# Patient Record
Sex: Male | Born: 1960 | Race: White | Hispanic: No | Smoking: Current every day smoker
Health system: Southern US, Community
[De-identification: ages and names within clinical notes are randomized; demographics above are authoritative.]

## PROBLEM LIST (undated history)

## (undated) DIAGNOSIS — F329 Major depressive disorder, single episode, unspecified: Secondary | ICD-10-CM

## (undated) DIAGNOSIS — F1011 Alcohol abuse, in remission: Secondary | ICD-10-CM

## (undated) DIAGNOSIS — F32A Depression, unspecified: Secondary | ICD-10-CM

## (undated) DIAGNOSIS — I219 Acute myocardial infarction, unspecified: Secondary | ICD-10-CM

## (undated) DIAGNOSIS — I10 Essential (primary) hypertension: Secondary | ICD-10-CM

## (undated) DIAGNOSIS — K449 Diaphragmatic hernia without obstruction or gangrene: Secondary | ICD-10-CM

## (undated) DIAGNOSIS — D126 Benign neoplasm of colon, unspecified: Secondary | ICD-10-CM

## (undated) DIAGNOSIS — J439 Emphysema, unspecified: Secondary | ICD-10-CM

## (undated) DIAGNOSIS — K219 Gastro-esophageal reflux disease without esophagitis: Secondary | ICD-10-CM

## (undated) DIAGNOSIS — K2270 Barrett's esophagus without dysplasia: Secondary | ICD-10-CM

## (undated) DIAGNOSIS — I43 Cardiomyopathy in diseases classified elsewhere: Secondary | ICD-10-CM

## (undated) DIAGNOSIS — K5790 Diverticulosis of intestine, part unspecified, without perforation or abscess without bleeding: Secondary | ICD-10-CM

## (undated) DIAGNOSIS — F419 Anxiety disorder, unspecified: Secondary | ICD-10-CM

## (undated) DIAGNOSIS — E7800 Pure hypercholesterolemia, unspecified: Secondary | ICD-10-CM

## (undated) DIAGNOSIS — Z955 Presence of coronary angioplasty implant and graft: Secondary | ICD-10-CM

## (undated) DIAGNOSIS — F1010 Alcohol abuse, uncomplicated: Secondary | ICD-10-CM

## (undated) DIAGNOSIS — Z973 Presence of spectacles and contact lenses: Secondary | ICD-10-CM

## (undated) DIAGNOSIS — K222 Esophageal obstruction: Secondary | ICD-10-CM

## (undated) DIAGNOSIS — F17200 Nicotine dependence, unspecified, uncomplicated: Secondary | ICD-10-CM

## (undated) DIAGNOSIS — I2510 Atherosclerotic heart disease of native coronary artery without angina pectoris: Secondary | ICD-10-CM

## (undated) HISTORY — DX: Barrett's esophagus without dysplasia: K22.70

## (undated) HISTORY — DX: Pure hypercholesterolemia, unspecified: E78.00

## (undated) HISTORY — DX: Emphysema, unspecified: J43.9

## (undated) HISTORY — DX: Alcohol abuse, uncomplicated: F10.10

## (undated) HISTORY — DX: Major depressive disorder, single episode, unspecified: F32.9

## (undated) HISTORY — DX: Cardiomyopathy in diseases classified elsewhere: I43

## (undated) HISTORY — DX: Diaphragmatic hernia without obstruction or gangrene: K44.9

## (undated) HISTORY — PX: HERNIA REPAIR: SHX51

## (undated) HISTORY — DX: Anxiety disorder, unspecified: F41.9

## (undated) HISTORY — PX: MOUTH SURGERY: SHX715

## (undated) HISTORY — DX: Esophageal obstruction: K22.2

## (undated) HISTORY — PX: WISDOM TOOTH EXTRACTION: SHX21

## (undated) HISTORY — DX: Benign neoplasm of colon, unspecified: D12.6

## (undated) HISTORY — DX: Depression, unspecified: F32.A

## (undated) HISTORY — PX: CORONARY STENT PLACEMENT: SHX1402

## (undated) HISTORY — PX: TONSILLECTOMY: SUR1361

## (undated) HISTORY — DX: Diverticulosis of intestine, part unspecified, without perforation or abscess without bleeding: K57.90

---

## 2001-02-13 ENCOUNTER — Encounter (INDEPENDENT_AMBULATORY_CARE_PROVIDER_SITE_OTHER): Payer: Self-pay | Admitting: Specialist

## 2001-02-13 ENCOUNTER — Ambulatory Visit (HOSPITAL_COMMUNITY): Admission: RE | Admit: 2001-02-13 | Discharge: 2001-02-13 | Payer: Self-pay | Admitting: Gastroenterology

## 2001-02-13 ENCOUNTER — Encounter: Payer: Self-pay | Admitting: Gastroenterology

## 2005-10-13 ENCOUNTER — Ambulatory Visit: Payer: Self-pay | Admitting: *Deleted

## 2005-10-25 ENCOUNTER — Ambulatory Visit: Payer: Self-pay | Admitting: *Deleted

## 2005-11-13 ENCOUNTER — Ambulatory Visit: Payer: Self-pay | Admitting: *Deleted

## 2005-11-24 ENCOUNTER — Ambulatory Visit: Payer: Self-pay | Admitting: *Deleted

## 2005-12-08 ENCOUNTER — Ambulatory Visit: Payer: Self-pay | Admitting: *Deleted

## 2005-12-15 ENCOUNTER — Ambulatory Visit: Payer: Self-pay | Admitting: *Deleted

## 2005-12-29 ENCOUNTER — Ambulatory Visit: Payer: Self-pay | Admitting: *Deleted

## 2006-01-19 ENCOUNTER — Ambulatory Visit: Payer: Self-pay | Admitting: *Deleted

## 2006-01-26 ENCOUNTER — Ambulatory Visit: Payer: Self-pay | Admitting: *Deleted

## 2006-02-02 ENCOUNTER — Ambulatory Visit: Payer: Self-pay | Admitting: *Deleted

## 2006-02-16 ENCOUNTER — Ambulatory Visit: Payer: Self-pay | Admitting: *Deleted

## 2006-02-23 ENCOUNTER — Ambulatory Visit: Payer: Self-pay | Admitting: *Deleted

## 2006-03-09 ENCOUNTER — Ambulatory Visit: Payer: Self-pay | Admitting: *Deleted

## 2006-03-16 ENCOUNTER — Ambulatory Visit: Payer: Self-pay | Admitting: *Deleted

## 2006-04-13 ENCOUNTER — Ambulatory Visit: Payer: Self-pay | Admitting: *Deleted

## 2006-04-27 ENCOUNTER — Ambulatory Visit: Payer: Self-pay | Admitting: *Deleted

## 2006-05-02 ENCOUNTER — Ambulatory Visit: Payer: Self-pay | Admitting: *Deleted

## 2006-05-09 ENCOUNTER — Ambulatory Visit: Payer: Self-pay | Admitting: *Deleted

## 2006-05-16 ENCOUNTER — Ambulatory Visit: Payer: Self-pay | Admitting: *Deleted

## 2006-05-23 ENCOUNTER — Ambulatory Visit: Payer: Self-pay | Admitting: *Deleted

## 2006-05-30 ENCOUNTER — Ambulatory Visit: Payer: Self-pay | Admitting: *Deleted

## 2006-06-06 ENCOUNTER — Ambulatory Visit: Payer: Self-pay | Admitting: *Deleted

## 2006-06-13 ENCOUNTER — Ambulatory Visit: Payer: Self-pay | Admitting: *Deleted

## 2006-06-20 ENCOUNTER — Ambulatory Visit: Payer: Self-pay | Admitting: *Deleted

## 2006-06-27 ENCOUNTER — Ambulatory Visit: Payer: Self-pay | Admitting: *Deleted

## 2006-07-04 ENCOUNTER — Ambulatory Visit: Payer: Self-pay | Admitting: *Deleted

## 2006-07-18 ENCOUNTER — Ambulatory Visit: Payer: Self-pay | Admitting: *Deleted

## 2006-07-25 ENCOUNTER — Ambulatory Visit: Payer: Self-pay | Admitting: *Deleted

## 2006-08-01 ENCOUNTER — Ambulatory Visit: Payer: Self-pay | Admitting: *Deleted

## 2006-08-08 ENCOUNTER — Ambulatory Visit: Payer: Self-pay | Admitting: *Deleted

## 2006-08-15 ENCOUNTER — Ambulatory Visit: Payer: Self-pay | Admitting: *Deleted

## 2006-08-22 ENCOUNTER — Ambulatory Visit: Payer: Self-pay | Admitting: *Deleted

## 2006-08-29 ENCOUNTER — Ambulatory Visit: Payer: Self-pay | Admitting: *Deleted

## 2006-09-05 ENCOUNTER — Ambulatory Visit: Payer: Self-pay | Admitting: *Deleted

## 2006-09-12 ENCOUNTER — Ambulatory Visit: Payer: Self-pay | Admitting: *Deleted

## 2006-10-03 ENCOUNTER — Ambulatory Visit: Payer: Self-pay | Admitting: *Deleted

## 2006-10-10 ENCOUNTER — Ambulatory Visit: Payer: Self-pay | Admitting: *Deleted

## 2006-10-17 ENCOUNTER — Ambulatory Visit: Payer: Self-pay | Admitting: *Deleted

## 2006-10-24 ENCOUNTER — Ambulatory Visit: Payer: Self-pay | Admitting: *Deleted

## 2006-10-31 ENCOUNTER — Ambulatory Visit: Payer: Self-pay | Admitting: *Deleted

## 2006-11-14 ENCOUNTER — Ambulatory Visit: Payer: Self-pay | Admitting: *Deleted

## 2006-11-28 ENCOUNTER — Ambulatory Visit: Payer: Self-pay | Admitting: *Deleted

## 2006-12-05 ENCOUNTER — Ambulatory Visit: Payer: Self-pay | Admitting: *Deleted

## 2006-12-14 ENCOUNTER — Ambulatory Visit: Payer: Self-pay | Admitting: *Deleted

## 2006-12-19 ENCOUNTER — Ambulatory Visit: Payer: Self-pay | Admitting: *Deleted

## 2006-12-28 ENCOUNTER — Ambulatory Visit: Payer: Self-pay | Admitting: *Deleted

## 2007-10-03 DIAGNOSIS — I219 Acute myocardial infarction, unspecified: Secondary | ICD-10-CM

## 2007-10-03 HISTORY — PX: CARDIAC CATHETERIZATION: SHX172

## 2007-10-03 HISTORY — DX: Acute myocardial infarction, unspecified: I21.9

## 2008-01-23 ENCOUNTER — Inpatient Hospital Stay (HOSPITAL_COMMUNITY): Admission: EM | Admit: 2008-01-23 | Discharge: 2008-01-26 | Payer: Self-pay | Admitting: Emergency Medicine

## 2008-01-24 ENCOUNTER — Encounter (INDEPENDENT_AMBULATORY_CARE_PROVIDER_SITE_OTHER): Payer: Self-pay | Admitting: Cardiology

## 2009-10-02 HISTORY — PX: PARTIAL COLECTOMY: SHX5273

## 2009-10-02 HISTORY — PX: APPENDECTOMY: SHX54

## 2009-10-03 ENCOUNTER — Inpatient Hospital Stay (HOSPITAL_COMMUNITY): Admission: EM | Admit: 2009-10-03 | Discharge: 2009-10-12 | Payer: Self-pay | Admitting: Emergency Medicine

## 2009-10-06 ENCOUNTER — Encounter (INDEPENDENT_AMBULATORY_CARE_PROVIDER_SITE_OTHER): Payer: Self-pay | Admitting: Interventional Cardiology

## 2009-10-18 ENCOUNTER — Ambulatory Visit (HOSPITAL_COMMUNITY): Admission: RE | Admit: 2009-10-18 | Discharge: 2009-10-18 | Payer: Self-pay | Admitting: General Surgery

## 2009-10-28 ENCOUNTER — Inpatient Hospital Stay (HOSPITAL_COMMUNITY): Admission: AD | Admit: 2009-10-28 | Discharge: 2009-11-03 | Payer: Self-pay | Admitting: General Surgery

## 2009-10-28 HISTORY — PX: SIGMOIDECTOMY: SHX176

## 2010-06-22 IMAGING — CR DG ABD PORTABLE 1V
1 series · 1 of 1 positions shown · non-contrast
Comparison: Portable exam 6553 hours without priors for comparison.

CLINICAL DATA: Sigmoid diverticulitis, pelvic abscess, nasogastric
tube placement

ABDOMEN - 1 VIEW

[AP]
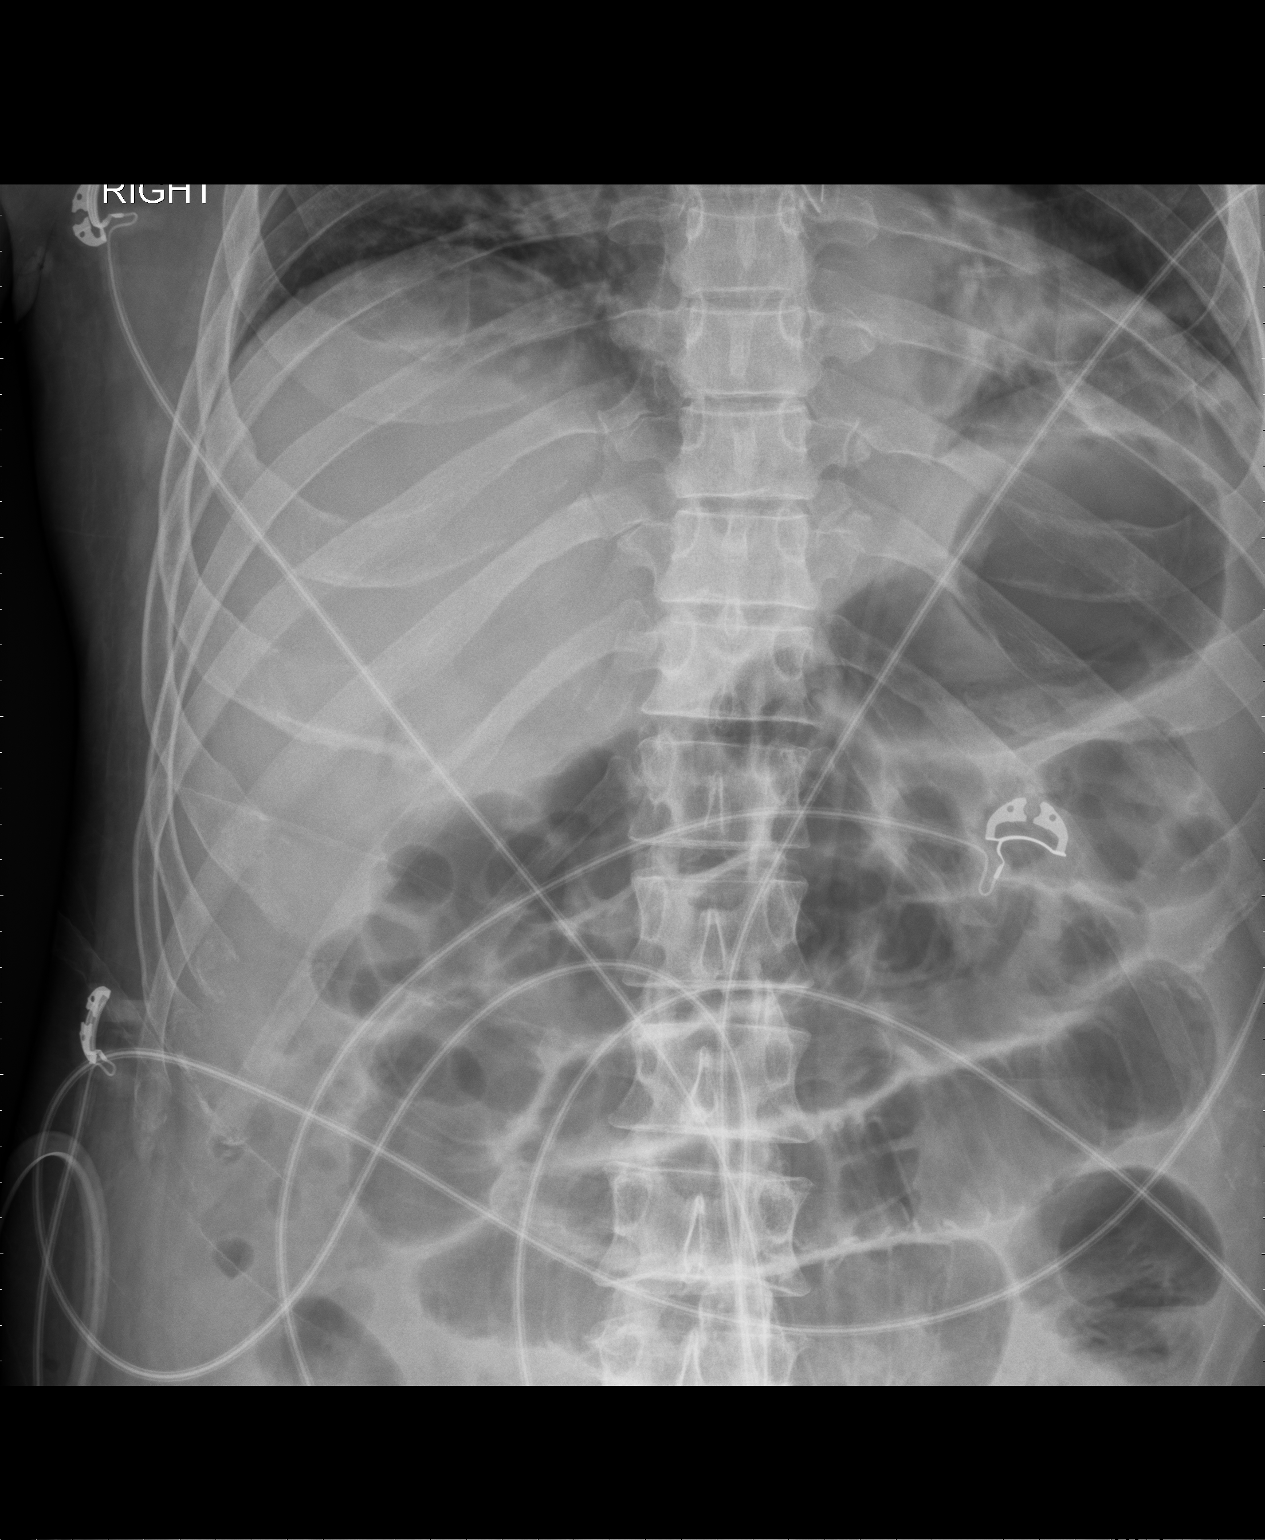

[1 of 1 positions shown; findings below may reference images not displayed]

FINDINGS: Nasogastric tube coiled in mid to distal thoracic esophagus.
Dilated small bowel loops throughout abdomen.
No definite bowel wall thickening or colonic dilatation.
Question bibasilar atelectasis.
IMPRESSION: Dilated small bowel loops, question ileus versus obstruction.
Nasogastric tube coiled in mid to distal thoracic esophagus,
recommend withdrawal and replacement.

## 2010-06-23 IMAGING — CR DG ABD PORTABLE 1V
1 series · 1 of 1 positions shown · non-contrast
Comparison: Plain films earlier today at 8538 hours

CLINICAL DATA: Nasogastric tube placement

ABDOMEN - 1 VIEW

[AP]
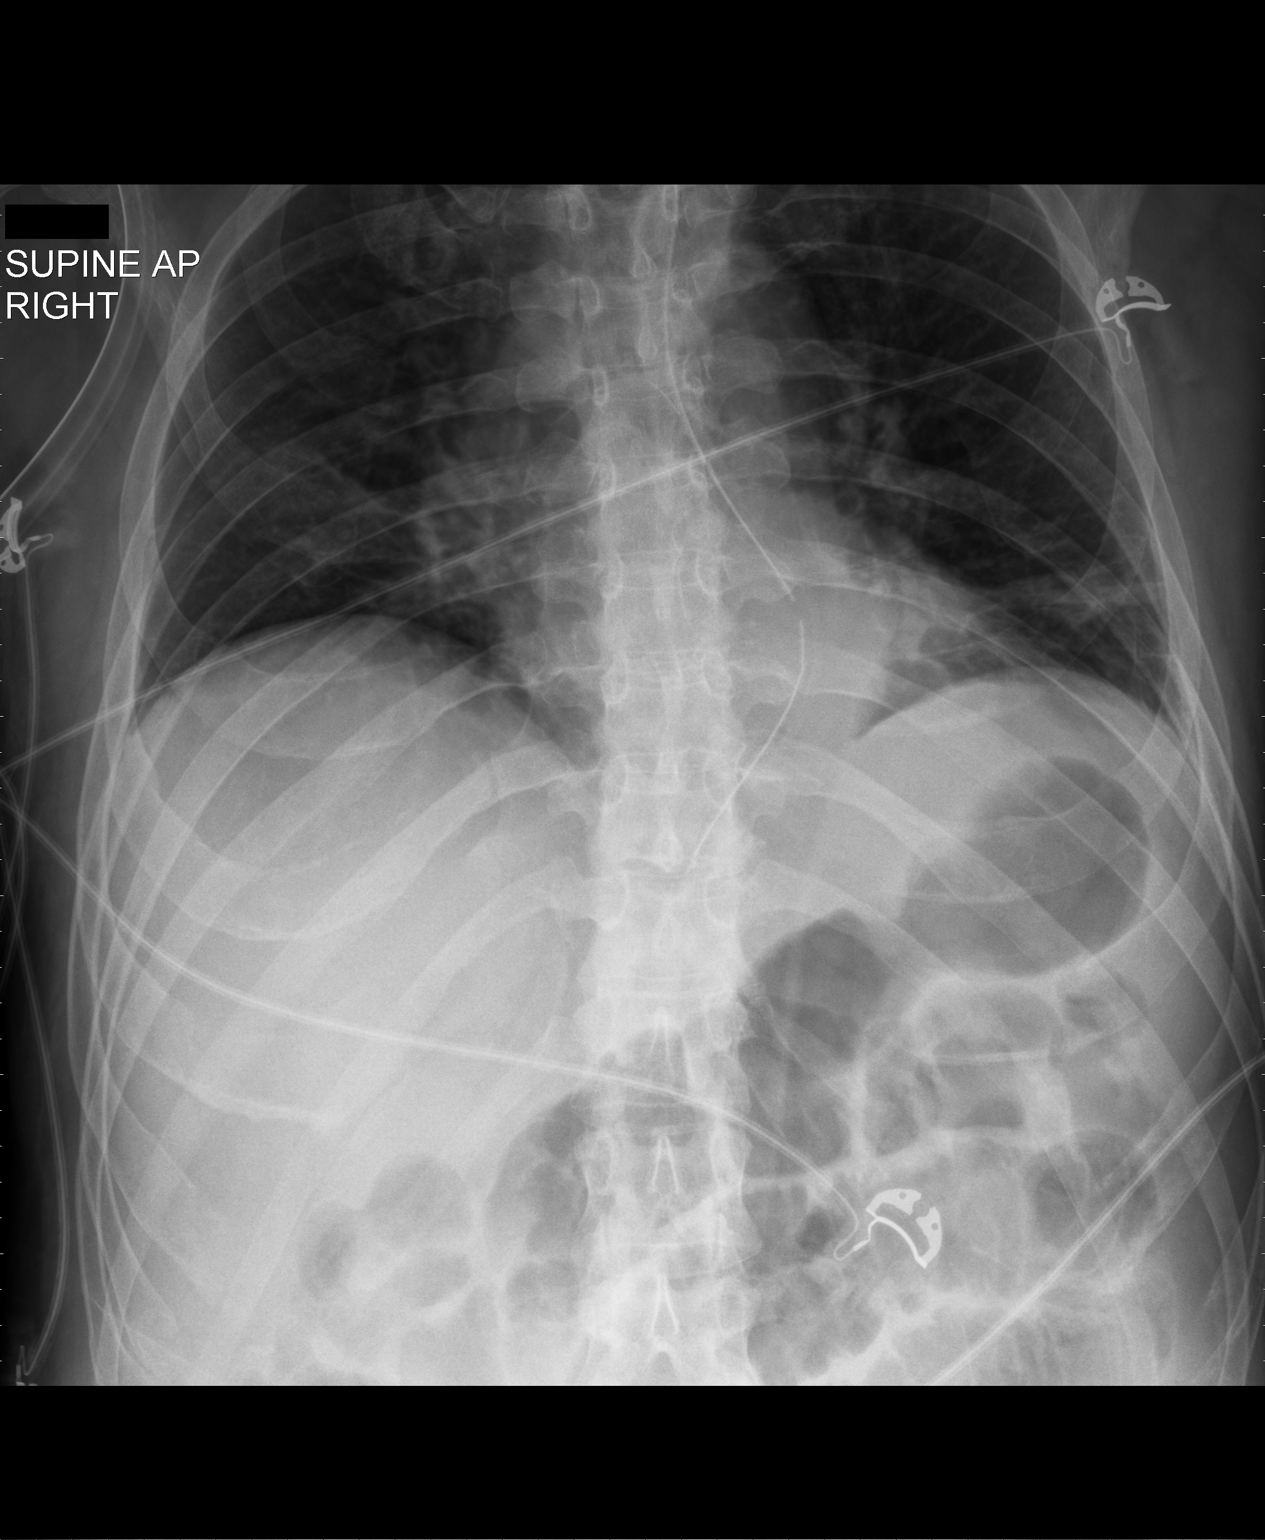

[1 of 1 positions shown; findings below may reference images not displayed]

FINDINGS: Nasogastric tube tip lies at approximately the
gastroesophageal junction, unchanged in position from films earlier
today. Moderate intestinal distention persists.
IMPRESSION: As above.

## 2010-06-23 IMAGING — CR DG ABD PORTABLE 1V
2 series · 2 of 2 positions shown · non-contrast
Comparison: Portable exam [DATE] hours compared to 10/29/2009

CLINICAL DATA: Sigmoid diverticulitis, pelvic abscess, nasogastric
tube placement

ABDOMEN - 1 VIEW

[view not recorded (1 of 2)]
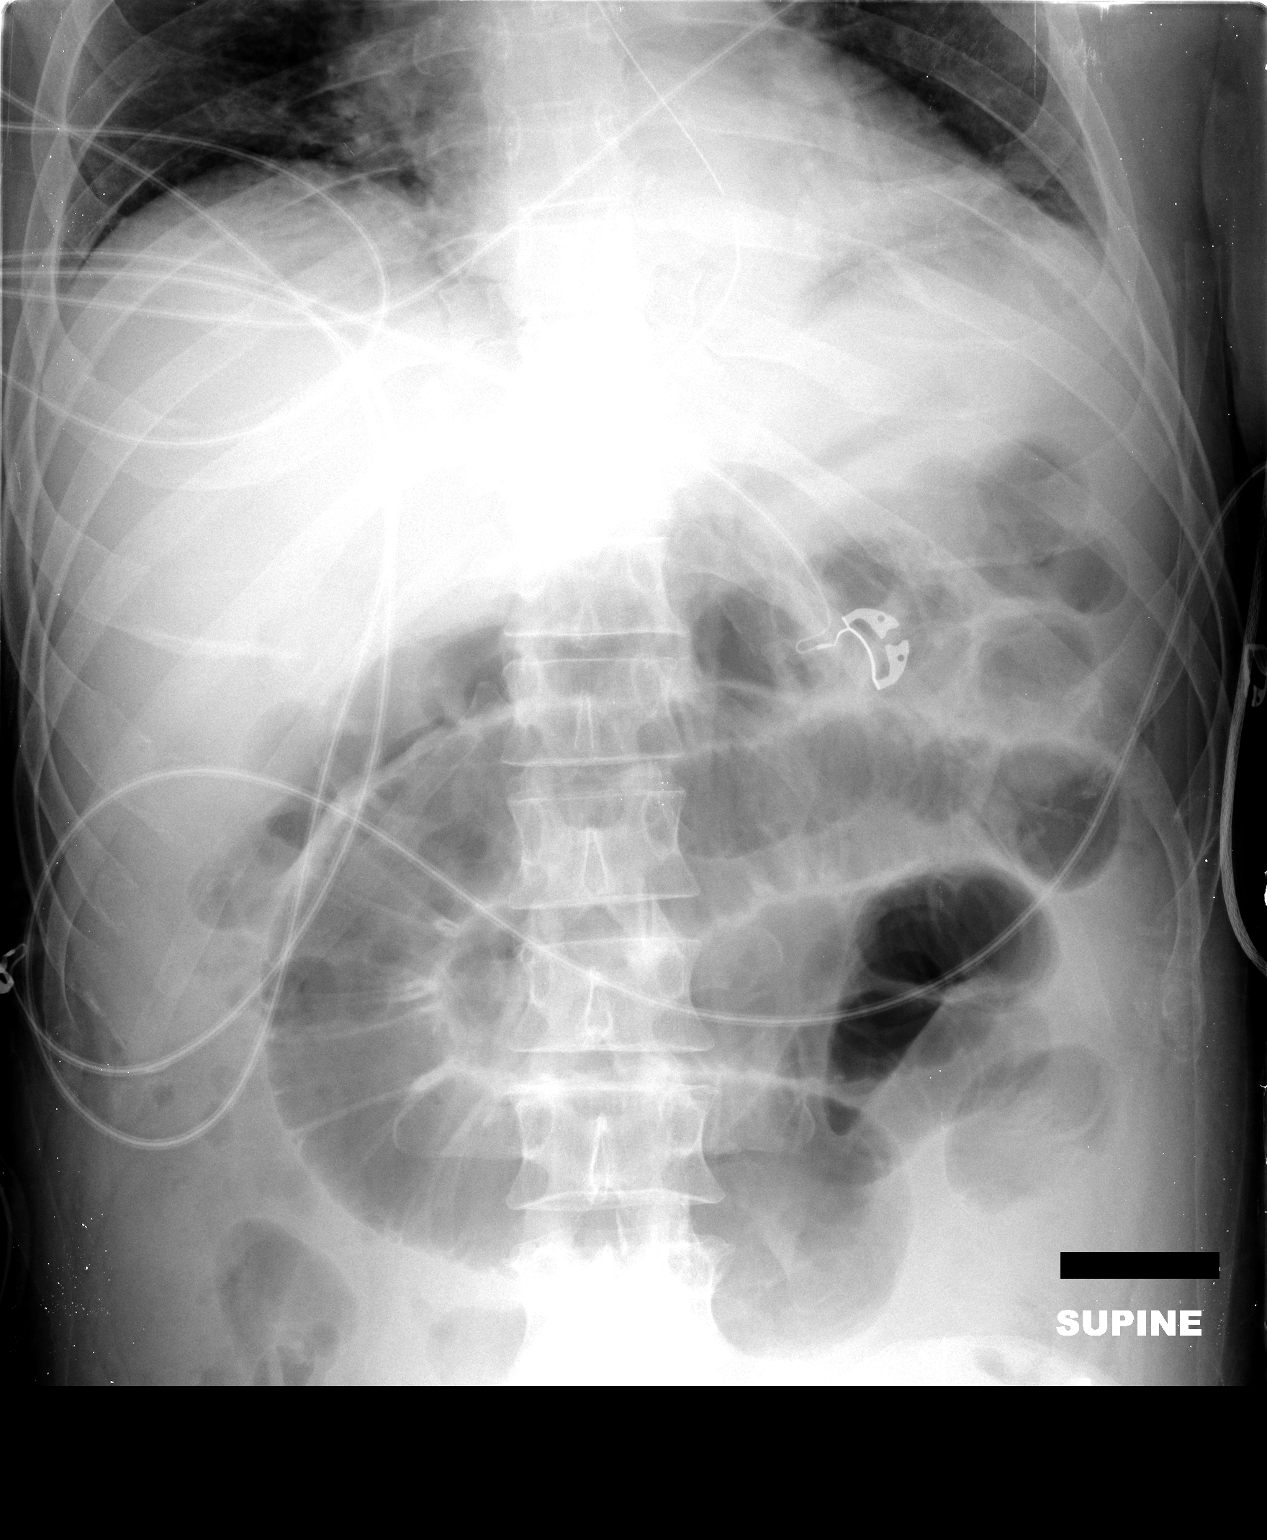

[view not recorded (2 of 2)]
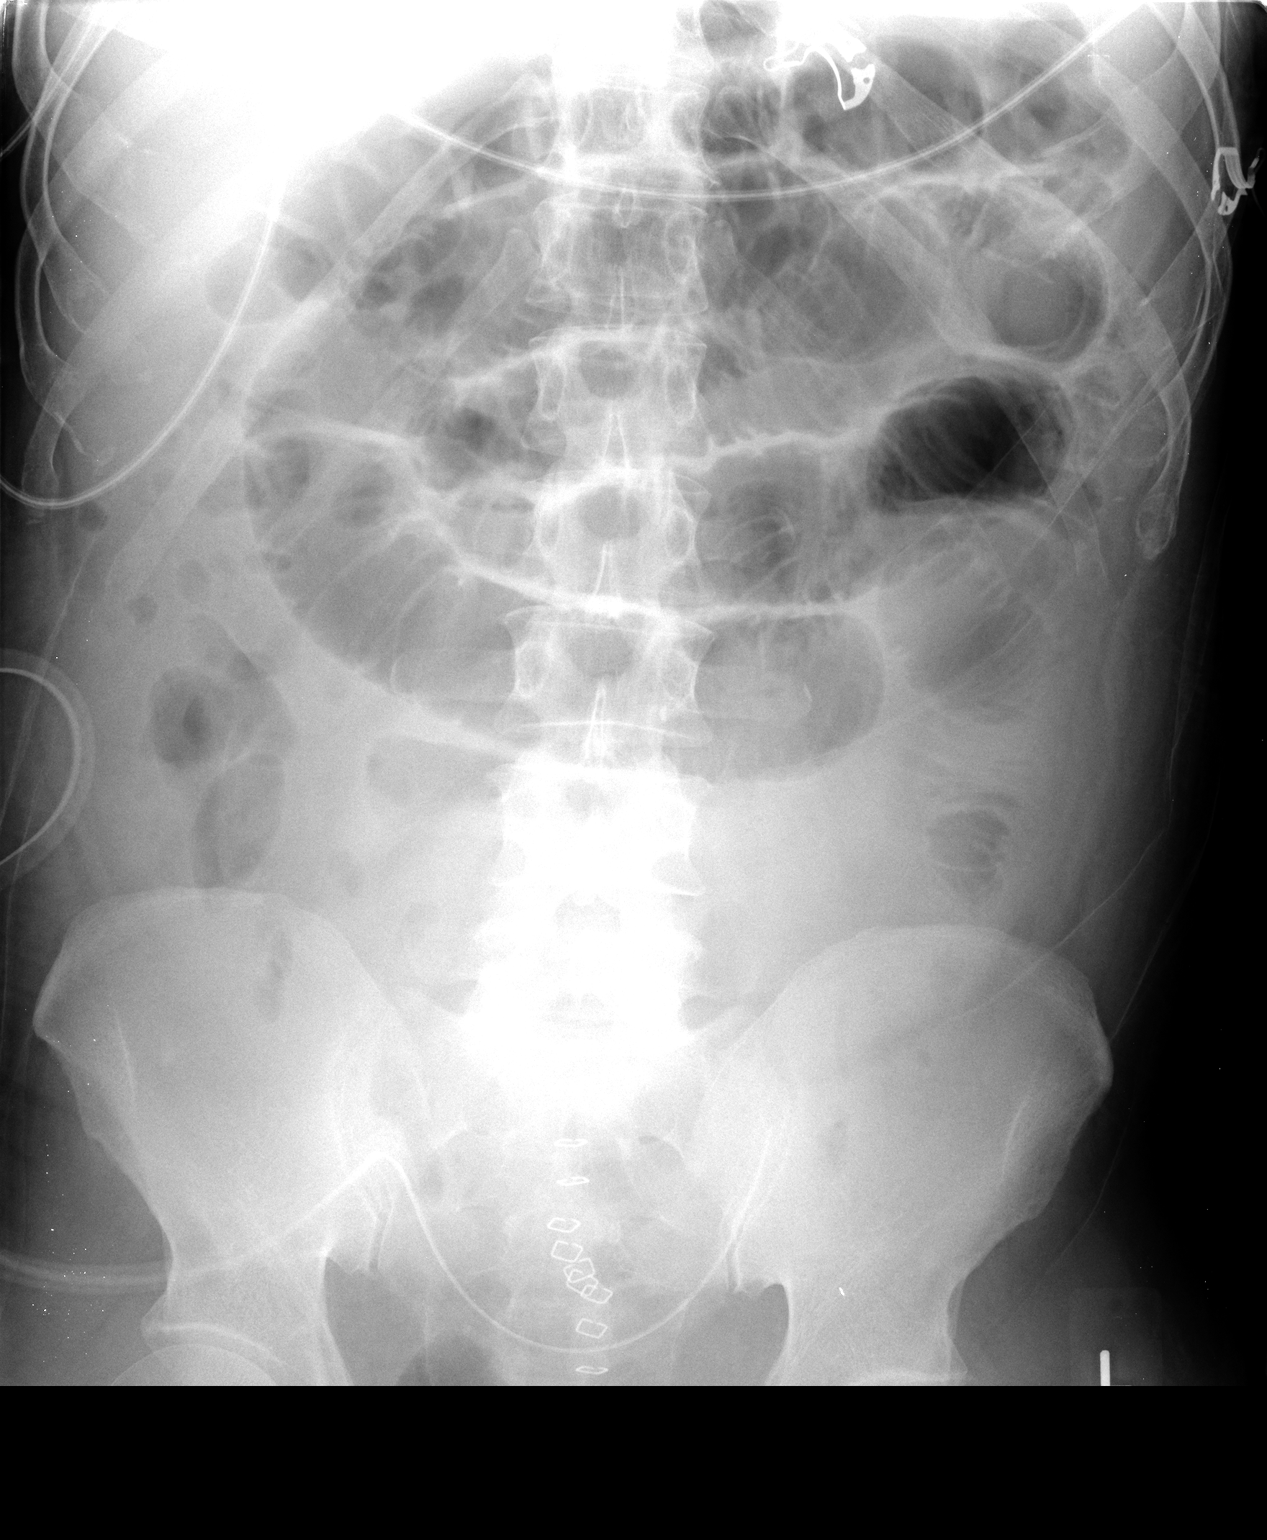

[2 of 2 positions shown; findings below may reference images not displayed]

FINDINGS: Tip of nasogastric tube is at the diaphragm or very upper abdomen,
potentially within a small hiatal hernia.
Persistent dilatation of small bowel loops.
Minimal bibasilar atelectasis.
Surgical drain in pelvis.
IMPRESSION: Persistent small bowel dilatation.
Tip of nasogastric tube is either within a small hiatal hernia or
at the gastroesophageal junction.

## 2010-10-02 DIAGNOSIS — D126 Benign neoplasm of colon, unspecified: Secondary | ICD-10-CM

## 2010-10-02 HISTORY — DX: Benign neoplasm of colon, unspecified: D12.6

## 2010-11-24 ENCOUNTER — Other Ambulatory Visit: Payer: Self-pay | Admitting: Gastroenterology

## 2010-12-18 LAB — POTASSIUM
Potassium: 2.9 mEq/L — ABNORMAL LOW (ref 3.5–5.1)
Potassium: 3.1 mEq/L — ABNORMAL LOW (ref 3.5–5.1)

## 2010-12-18 LAB — CBC
HCT: 30 % — ABNORMAL LOW (ref 39.0–52.0)
HCT: 40.6 % (ref 39.0–52.0)
HCT: 34.5 % — ABNORMAL LOW (ref 39.0–52.0)
HCT: 38.1 % — ABNORMAL LOW (ref 39.0–52.0)
Hemoglobin: 10.9 g/dL — ABNORMAL LOW (ref 13.0–17.0)
Hemoglobin: 11.4 g/dL — ABNORMAL LOW (ref 13.0–17.0)
Hemoglobin: 11.7 g/dL — ABNORMAL LOW (ref 13.0–17.0)
Hemoglobin: 11.7 g/dL — ABNORMAL LOW (ref 13.0–17.0)
Hemoglobin: 13.9 g/dL (ref 13.0–17.0)
Hemoglobin: 13.1 g/dL (ref 13.0–17.0)
Hemoglobin: 13.5 g/dL (ref 13.0–17.0)
MCHC: 34.1 g/dL (ref 30.0–36.0)
MCHC: 34.3 g/dL (ref 30.0–36.0)
MCHC: 34.4 g/dL (ref 30.0–36.0)
MCHC: 34.9 g/dL (ref 30.0–36.0)
MCHC: 32.9 g/dL (ref 30.0–36.0)
MCHC: 34.2 g/dL (ref 30.0–36.0)
MCHC: 34.7 g/dL (ref 30.0–36.0)
MCV: 96.4 fL (ref 78.0–100.0)
MCV: 96.5 fL (ref 78.0–100.0)
MCV: 96.7 fL (ref 78.0–100.0)
MCV: 97.6 fL (ref 78.0–100.0)
MCV: 97.8 fL (ref 78.0–100.0)
MCV: 96.5 fL (ref 78.0–100.0)
MCV: 97.5 fL (ref 78.0–100.0)
Platelets: 227 10*3/uL (ref 150–400)
Platelets: 397 10*3/uL (ref 150–400)
Platelets: 221 10*3/uL (ref 150–400)
Platelets: 246 10*3/uL (ref 150–400)
Platelets: 284 10*3/uL (ref 150–400)
RBC: 3.01 MIL/uL — ABNORMAL LOW (ref 4.22–5.81)
RBC: 3.11 MIL/uL — ABNORMAL LOW (ref 4.22–5.81)
RBC: 3.11 MIL/uL — ABNORMAL LOW (ref 4.22–5.81)
RBC: 3.12 MIL/uL — ABNORMAL LOW (ref 4.22–5.81)
RBC: 3.22 MIL/uL — ABNORMAL LOW (ref 4.22–5.81)
RBC: 3.37 MIL/uL — ABNORMAL LOW (ref 4.22–5.81)
RBC: 3.48 MIL/uL — ABNORMAL LOW (ref 4.22–5.81)
RBC: 3.51 MIL/uL — ABNORMAL LOW (ref 4.22–5.81)
RBC: 3.53 MIL/uL — ABNORMAL LOW (ref 4.22–5.81)
RBC: 3.97 MIL/uL — ABNORMAL LOW (ref 4.22–5.81)
RBC: 4.14 MIL/uL — ABNORMAL LOW (ref 4.22–5.81)
RDW: 13.2 % (ref 11.5–15.5)
RDW: 14.1 % (ref 11.5–15.5)
RDW: 14.3 % (ref 11.5–15.5)
RDW: 14.4 % (ref 11.5–15.5)
WBC: 10 10*3/uL (ref 4.0–10.5)
WBC: 11.9 10*3/uL — ABNORMAL HIGH (ref 4.0–10.5)
WBC: 14.5 10*3/uL — ABNORMAL HIGH (ref 4.0–10.5)
WBC: 6.2 10*3/uL (ref 4.0–10.5)
WBC: 6.4 10*3/uL (ref 4.0–10.5)
WBC: 8.1 10*3/uL (ref 4.0–10.5)
WBC: 9.9 10*3/uL (ref 4.0–10.5)
WBC: 11.9 10*3/uL — ABNORMAL HIGH (ref 4.0–10.5)
WBC: 9.1 10*3/uL (ref 4.0–10.5)
WBC: 9.9 10*3/uL (ref 4.0–10.5)

## 2010-12-18 LAB — COMPREHENSIVE METABOLIC PANEL
ALT: 14 U/L (ref 0–53)
AST: 16 U/L (ref 0–37)
AST: 29 U/L (ref 0–37)
Albumin: 2.8 g/dL — ABNORMAL LOW (ref 3.5–5.2)
Albumin: 3.3 g/dL — ABNORMAL LOW (ref 3.5–5.2)
Alkaline Phosphatase: 50 U/L (ref 39–117)
BUN: 5 mg/dL — ABNORMAL LOW (ref 6–23)
BUN: 10 mg/dL (ref 6–23)
CO2: 28 mEq/L (ref 19–32)
Calcium: 7.8 mg/dL — ABNORMAL LOW (ref 8.4–10.5)
Chloride: 103 mEq/L (ref 96–112)
Chloride: 103 mEq/L (ref 96–112)
Chloride: 103 mEq/L (ref 96–112)
Creatinine, Ser: 1.05 mg/dL (ref 0.4–1.5)
Creatinine, Ser: 1.49 mg/dL (ref 0.4–1.5)
GFR calc Af Amer: >60 mL/min (ref >60)
GFR calc non Af Amer: >60 mL/min (ref >60)
Glucose, Bld: 101 mg/dL — ABNORMAL HIGH (ref 70–99)
Glucose, Bld: 111 mg/dL — ABNORMAL HIGH (ref 70–99)
Potassium: 4.4 mEq/L (ref 3.5–5.1)
Total Bilirubin: 0.6 mg/dL (ref 0.3–1.2)
Total Bilirubin: 1.7 mg/dL — ABNORMAL HIGH (ref 0.3–1.2)
Total Bilirubin: 0.6 mg/dL (ref 0.3–1.2)

## 2010-12-18 LAB — BASIC METABOLIC PANEL
BUN: 13 mg/dL (ref 6–23)
BUN: 15 mg/dL (ref 6–23)
BUN: 3 mg/dL — ABNORMAL LOW (ref 6–23)
BUN: 6 mg/dL (ref 6–23)
BUN: 6 mg/dL (ref 6–23)
CO2: 24 mEq/L (ref 19–32)
CO2: 29 mEq/L (ref 19–32)
CO2: 29 mEq/L (ref 19–32)
CO2: 28 mEq/L (ref 19–32)
CO2: 32 mEq/L (ref 19–32)
Calcium: 7.5 mg/dL — ABNORMAL LOW (ref 8.4–10.5)
Calcium: 7.8 mg/dL — ABNORMAL LOW (ref 8.4–10.5)
Calcium: 7.9 mg/dL — ABNORMAL LOW (ref 8.4–10.5)
Calcium: 8.2 mg/dL — ABNORMAL LOW (ref 8.4–10.5)
Calcium: 7.8 mg/dL — ABNORMAL LOW (ref 8.4–10.5)
Chloride: 104 mEq/L (ref 96–112)
Chloride: 105 mEq/L (ref 96–112)
Chloride: 105 mEq/L (ref 96–112)
Chloride: 106 mEq/L (ref 96–112)
Chloride: 111 mEq/L (ref 96–112)
Chloride: 99 mEq/L (ref 96–112)
Chloride: 99 mEq/L (ref 96–112)
Creatinine, Ser: 0.86 mg/dL (ref 0.4–1.5)
Creatinine, Ser: 1.03 mg/dL (ref 0.4–1.5)
Creatinine, Ser: 1.07 mg/dL (ref 0.4–1.5)
Creatinine, Ser: 1.08 mg/dL (ref 0.4–1.5)
Creatinine, Ser: 1.21 mg/dL (ref 0.4–1.5)
Creatinine, Ser: 0.97 mg/dL (ref 0.4–1.5)
Creatinine, Ser: 1 mg/dL (ref 0.4–1.5)
GFR calc Af Amer: >60 mL/min (ref >60)
GFR calc Af Amer: >60 mL/min (ref >60)
GFR calc Af Amer: >60 mL/min (ref >60)
GFR calc Af Amer: >60 mL/min (ref >60)
GFR calc Af Amer: >60 mL/min (ref >60)
GFR calc Af Amer: >60 mL/min (ref >60)
GFR calc non Af Amer: >60 mL/min (ref >60)
GFR calc non Af Amer: >60 mL/min (ref >60)
GFR calc non Af Amer: >60 mL/min (ref >60)
Glucose, Bld: 110 mg/dL — ABNORMAL HIGH (ref 70–99)
Glucose, Bld: 109 mg/dL — ABNORMAL HIGH (ref 70–99)
Potassium: 4.1 mEq/L (ref 3.5–5.1)
Potassium: 4.7 mEq/L (ref 3.5–5.1)
Sodium: 141 mEq/L (ref 135–145)
Sodium: 142 mEq/L (ref 135–145)
Sodium: 139 mEq/L (ref 135–145)

## 2010-12-18 LAB — URINALYSIS, ROUTINE W REFLEX MICROSCOPIC
Ketones, ur: 15 mg/dL — AB
Nitrite: NEGATIVE
Protein, ur: 30 mg/dL — AB
Specific Gravity, Urine: >1.046 — ABNORMAL HIGH (ref 1.005–1.030)
Urobilinogen, UA: 1 mg/dL (ref 0.0–1.0)
pH: 5.5 (ref 5.0–8.0)

## 2010-12-18 LAB — DIFFERENTIAL
Basophils Absolute: 0 10*3/uL (ref 0.0–0.1)
Basophils Absolute: 0 10*3/uL (ref 0.0–0.1)
Basophils Absolute: 0.1 10*3/uL (ref 0.0–0.1)
Basophils Relative: 0 % (ref 0–1)
Basophils Relative: 1 % (ref 0–1)
Basophils Relative: 1 % (ref 0–1)
Eosinophils Absolute: 0.1 10*3/uL (ref 0.0–0.7)
Eosinophils Absolute: 0.2 10*3/uL (ref 0.0–0.7)
Eosinophils Relative: 1 % (ref 0–5)
Lymphocytes Relative: 5 % — ABNORMAL LOW (ref 12–46)
Monocytes Absolute: 0.6 10*3/uL (ref 0.1–1.0)
Monocytes Absolute: 0.7 10*3/uL (ref 0.1–1.0)
Monocytes Absolute: 0.8 10*3/uL (ref 0.1–1.0)
Monocytes Relative: 11 % (ref 3–12)
Neutro Abs: 8.8 10*3/uL — ABNORMAL HIGH (ref 1.7–7.7)
Neutro Abs: 3.7 10*3/uL (ref 1.7–7.7)

## 2010-12-18 LAB — RAPID URINE DRUG SCREEN, HOSP PERFORMED
Amphetamines: NOT DETECTED
Barbiturates: NOT DETECTED
Benzodiazepines: NOT DETECTED
Cocaine: NOT DETECTED
Opiates: POSITIVE — AB

## 2010-12-18 LAB — POCT I-STAT, CHEM 8
Glucose, Bld: 102 mg/dL — ABNORMAL HIGH (ref 70–99)
HCT: 41 % (ref 39.0–52.0)
Hemoglobin: 13.9 g/dL (ref 13.0–17.0)
Potassium: 3.9 mEq/L (ref 3.5–5.1)

## 2010-12-18 LAB — CROSSMATCH
ABO/RH(D): A POS
Antibody Screen: NEGATIVE

## 2010-12-18 LAB — CULTURE, ROUTINE-ABSCESS

## 2010-12-18 LAB — GLUCOSE, CAPILLARY
Glucose-Capillary: 111 mg/dL — ABNORMAL HIGH (ref 70–99)
Glucose-Capillary: 111 mg/dL — ABNORMAL HIGH (ref 70–99)
Glucose-Capillary: 113 mg/dL — ABNORMAL HIGH (ref 70–99)
Glucose-Capillary: 114 mg/dL — ABNORMAL HIGH (ref 70–99)
Glucose-Capillary: 114 mg/dL — ABNORMAL HIGH (ref 70–99)
Glucose-Capillary: 117 mg/dL — ABNORMAL HIGH (ref 70–99)
Glucose-Capillary: 119 mg/dL — ABNORMAL HIGH (ref 70–99)
Glucose-Capillary: 121 mg/dL — ABNORMAL HIGH (ref 70–99)
Glucose-Capillary: 124 mg/dL — ABNORMAL HIGH (ref 70–99)
Glucose-Capillary: 124 mg/dL — ABNORMAL HIGH (ref 70–99)
Glucose-Capillary: 126 mg/dL — ABNORMAL HIGH (ref 70–99)

## 2010-12-18 LAB — URINE CULTURE

## 2010-12-18 LAB — POCT CARDIAC MARKERS: CKMB, poc: <1 ng/mL — ABNORMAL LOW (ref 1.0–8.0)

## 2010-12-18 LAB — ABO/RH: ABO/RH(D): A POS

## 2010-12-18 LAB — TSH: TSH: 2.543 u[IU]/mL (ref 0.350–4.500)

## 2010-12-18 LAB — PREALBUMIN: Prealbumin: 2.2 mg/dL — ABNORMAL LOW (ref 18.0–45.0)

## 2010-12-18 LAB — PROTIME-INR
INR: 1.17 (ref 0.00–1.49)
Prothrombin Time: 14.8 seconds (ref 11.6–15.2)

## 2010-12-18 LAB — CREATININE, SERUM
Creatinine, Ser: 1.11 mg/dL (ref 0.4–1.5)
Creatinine, Ser: 1.14 mg/dL (ref 0.4–1.5)
GFR calc Af Amer: >60 mL/min (ref >60)
GFR calc non Af Amer: >60 mL/min (ref >60)
GFR calc non Af Amer: >60 mL/min (ref >60)

## 2010-12-18 LAB — T4, FREE: Free T4: 1.6 ng/dL (ref 0.80–1.80)

## 2010-12-18 LAB — PHOSPHORUS: Phosphorus: 3.4 mg/dL (ref 2.3–4.6)

## 2010-12-18 LAB — ANAEROBIC CULTURE

## 2010-12-18 LAB — MAGNESIUM: Magnesium: 2 mg/dL (ref 1.5–2.5)

## 2010-12-18 LAB — URINE MICROSCOPIC-ADD ON

## 2010-12-21 LAB — CBC
Platelets: 223 10*3/uL (ref 150–400)
RDW: 14.9 % (ref 11.5–15.5)
WBC: 5.1 10*3/uL (ref 4.0–10.5)

## 2011-02-14 NOTE — H&P (Signed)
NAMEBETHANY, CUMMING               ACCOUNT NO.:  [id number]   MEDICAL RECORD NO.:  [id number]          PATIENT TYPE:  INP   LOCATION:  2902                         FACILITY:  [facility]   PHYSICIAN:  Guy Franco, P.A.       DATE OF BIRTH:  [birth year]   DATE OF ADMISSION:  01/23/2008  DATE OF DISCHARGE:                              HISTORY & PHYSICAL   CHIEF COMPLAINT:  Chest pain.   Mr. Resor is a 50 year old male patient who complains of intermittent  substernal chest pain over the past week.  This did have bilateral arm  involvement.  He denies any shortness of breath other than what he  notices with his regular tobacco abuse.  He denies any palpitations,  PND, orthopnea, dizziness, or syncope.   Night before last, he started having substernal chest pain that seemed  to wax and wane over the next 24 hours.  Finally, he wanted to go out of  town this weekend and he would seek medical attention, he went to the  [facility].  An EKG was done and showed an acute anterior  myocardial infarction.  The patient was instructed that EMS was going to  be called, but he refused EMS transport and said that he signed a  waiver.  He did drive himself to [facility] where an acute  anterior myocardial infarction, ST-segment elevated was noted and the  patient was taken emergently to the cardiac catheterization lab.   ALLERGIES:  No known drug allergies.   MEDICATIONS:  He takes Reglan p.r.n., Prevacid 30 mg a day over the past  several days.  He had been taking Chantix on and off over the past  several weeks and was trying to restart, it over the past several days.   SOCIAL HISTORY:  He is trying to quit smoking.  Alcohol, he drinks about  half a pint of liquor a day.  Illicit drugs, none.   FAMILY HISTORY:  Grandfather had heart disease in his 62s.  Dad have  some type of arrhythmia.  Mom died of cancer that was all over her  body.   PAST MEDICAL HISTORY:  ADHD and  tobacco abuse.   PHYSICAL EXAMINATION:  VITAL SINGS:  Temperature 97.9, pulse 92, blood  pressure 115/85, respirations 20, and O2 saturations is 97% on room air.  HEENT: Grossly normal.  No carotid or subclavian bruits.  No JVD or  thyromegaly.  Sclerae clear conjunctivae normal.  Nares without  drainage.  CHEST:  Clear to auscultation bilaterally.  No wheezing or rhonchi.  HEART:  Regular rate and rhythm.  No gross murmur.  ABDOMEN:  Good bowel sounds, nontender, nondistended.  No mass and no  bruits.  EXTREMITIES:  No lower extremity edema.  SKIN:  Warm and dry.   DIAGNOSTIC DATA:  EKG shows acute ST-segment elevation in the anterior  lead 3-mm.   Labs prior to his percutaneous coronary intervention show a white count  of 10.5, hemoglobin 16.9, hematocrit 50.3, platelets 203.  Point-of-care  markers showed a CK-MB of 58.3, troponin of 2.43, myoglobin 4.7.  Sodium  138, potassium 3.5, glucose 109, BUN 10, and creatinine 1.1.  PT 13.2  and INR 1.0.   ASSESSMENT:  1. Acute anterior myocardial infarction, ST-segment elevated.  2. Smoker, smoking cessation counseling.  3. Attention deficit hyperactivity disorder.  4. Alcohol abuse.      Guy Franco, P.A.     LB/MEDQ  D:  01/24/2008  T:  01/25/2008  Job:  [id number]   cc:   Francisca December, M.D.

## 2011-02-14 NOTE — Cardiovascular Report (Signed)
NAMELEVONE, OTTEN               ACCOUNT NO.:  [id number]   MEDICAL RECORD NO.:  [id number]          PATIENT TYPE:  INP   LOCATION:  2902                         FACILITY:  [facility]   PHYSICIAN:  Francisca December, M.D.  DATE OF BIRTH:  [birth year]   DATE OF PROCEDURE:  DATE OF DISCHARGE:                            CARDIAC CATHETERIZATION   PROCEDURES PERFORMED:  1. Left heart catheterization.  2. Left ventriculogram.  3. Coronary angiography.  4. Percutaneous coronary intervention/drug-eluting and bare metal      stent implantation mid-left anterior descending.  5. Intracoronary thrombectomy.   INDICATIONS:  [name] is a 50 year old male without prior  history of heart disease who is presented today to [facility] complaining of 24-36 hours of anterior substernal chest  pain radiating through to the back.  An ECG obtained, there was a  diagnostic for an acute anteroapical wall myocardial infarction.  The  patient refused EMS transport and drove himself to [facility].  There the myocardial infarction was again identified by ECG, and he was  transported emergently to a catheterization laboratory for direct PCI of  the presumed occluded left anterior descending artery.   Goals, risk and alternatives of the procedure were discussed with the  patient.  The patient states his understanding, had his questions  answered, and wished to proceed.   PROCEDURAL NOTE:  The patient brought to cardiac catheterization  laboratory, where the right groin was prepped and draped in usual  sterile fashion.  Local anesthesia was obtained with infiltration of 1%  lidocaine.  A 6-French catheter sheath was inserted percutaneously into  the right femoral artery utilizing an anterior approach over guiding J-  wire.  The right coronary angiography was then performed with a 6-French  #4 right Judkins catheter.  Cineangiography of the right coronary was  conducted in  LAO and RAO projections.  A 6-French # 3.5 CLS guiding  catheter was then advanced to the ascending aorta where left coronary  ostium was engaged and cineangiography performed in the left coronary in  multiple LAO and RAO projections.  We then proceeded with intracoronary  intervention of the LAD.  The patient received 300 mg of Plavix orally.  He received 0.75 mg/kg bolus of bivalirudin followed by 1.75 mg/kg per  hour constant infusion.  Resultant ACT was 336 seconds.  A 0.014 inch  Lauge intracoronary guidewire was passed across the lesion without  difficulty.  Initial balloon dilatation was performed with a 2.5/15 mm  Scimed Maverick intracoronary balloon.  This was placed across the  lesion in the mid to distal LAD and inflated to 6 atmospheres of 51  seconds.  Was deflated and removed and cineangiography revealed a  patency at the previous occlusion site, however, more distally the  artery remained occluded.  I then used a Fetch catheter for suction  thrombectomy.  I was successful in removing minimal amounts of white  thrombus.  However, the distal artery remained occluded.  I then  proceeded to stent the primary lesion with a 2.75/20 mm Scimed monorail  Liberate'.  This was  placed across the lesion in the mid to distal  portion of the LAD and deployed there to peak pressure 14 atmospheres  for 37 seconds.  The stent balloon was deflated and removed and  unfortunately there was still persistent occlusion more distally  beginning about 15 mm distal to the stented segment.  Therefore, a  Cordis transit catheter was advanced into the distal portion of the LAD  and #3 direct LAD intracoronary fusion and nitroglycerin and 100 mcg and  verapamil 200 mcg were administered.  The transit catheter was removed  after the wire was replaced and the distal patency was obtained with  TIMI III flow. However, 2 regions of persistent thrombus were seen, one  right at the distal edge of the previously  placed stent and the second  about a 12 mm more distally.  I post dilated the Leberte stent with  3.0/15 mm Quantum Maverick intracoronary balloon.  It was inflated to 16  atmospheres for 34 seconds in 2 separate positions within the stented  segment.  I then passed a fetch catheter once again, distal to the stent  and had excellent removal of heme, but the 2 small areas of thrombus  persisted.  I attempted to macerate this with a 2.5/20 mm Maverick  intracoronary balloon.  This was inflated to 4 atmospheres for 85  seconds.  It was deflated and removed and the thrombus was still noted  to be present.  Therefore, I proceeded to stent the more distal portion  of the LAD with a 2.75/18 mm Promex intracoronary drug-eluting stent.  This was deployed at peak pressure of 12 atmospheres for 30 seconds.  This did resolve the presence of thrombus,  I never was able to identify  any dissection plane.  The Promex stent was post dilated again with the  Quantum Maverick 3.0/15 mm device.  It was inflated in 3 separate  positions to a maximal pressure of 16 atmospheres for not greater than  70 seconds.  The transit catheter was returned to the circulation and  the distal artery was again infused with 300 mcg of verapamil and 150  mcg of nitroglycerin.  The transit catheter was removed and adequate  patency confirmed in orthogonal views.  The guiding catheter was then  removed.  A 30 degrees RAO cine left ventriculogram was then performed  utilizing a power injector and a 110 cm pigtail catheter.  A 39 mL were  injected at 13 mL per second.  The pigtail catheter was removed and at  45 degrees RAO right femoral arteriogram via the catheter sheath by hand  injection documented adequate anatomy for placement percutaneous closure  device Angio-Seal.  This was subsequently deployed with good hemostasis  and an intact distal pulse.  The patient is transported to the recovery  area in stable condition.    HEMODYNAMIC:  Systemic arterial pressure was  104/86 with a mean of 95  mmHg.  There was no systolic gradient across the aortic valve.  The left  ventricular end-diastolic pressure was 18 mmHg pre ventriculogram.   ANGIOGRAPHY:  The left ventriculogram demonstrated normal chamber size  and anteroapical akinesis extending into the inferoapical region.  A  visual estimate of the ejection fraction is 45%.  There are no other  wall motion abnormalities.  There is no mitral regurgitation.  There is  a trileaflet aortic valve that opens normally during systole.   There is a right-dominant coronary system present.  The right coronary  artery itself  is widely patent, I see no obstructions including no  luminal irregularities.  It gives rise to a single small-to-moderate  size posterior descending artery without any obstruction.  There is a  small posterolateral segment with a very small single left ventricular  branch.   The left main coronary artery is widely patent.   The left circumflex coronary and its branches are widely patent.  There  is a large vessel that bifurcates on the lateral wall of the heart  giving superior and inferior sub-branches to the large second marginal.  The first marginal was quite small.  There are no obstruction seen  whatsoever the left circumflex.   The left anterior descending artery and its branches are highly  diseased; the vessel gives rise to a large diagonal branch which  contains a 20% narrowing at its origin.  The ongoing anterior descending  artery has a 20%-30% narrowing at the origin of the diagonal branch.  Then approximately 15 mm distal at the origin of the third septal  perforator.  There is a complete occlusion with trivial distal flow  seen.  There is dye hang up after the initial injection.  The proximal  portion of LAD is large and again without any obstruction.  Following  the above extensive measures and with balloon dilatation, suction   thrombectomy and bare metal as well as drug-eluting stent implantation  there is no residual stenosis and distal flow is TIMI grade III.  The  ongoing anterior descending artery reaches and barely traverses the  apex.  It is a relatively small vessel some of which may be diffuse  spasm that was resistant to the intracoronary drug mentioned above.   FINAL IMPRESSION:  1. Atherosclerotic coronary vascular disease, single vessel.  2. Successful percutaneous coronary intervention/drug-eluting and bare      metal stent implantation mid to distal left anterior descending.  3. Acute anteroapical wall myocardial infarction.  4. Elevated left ventricular end-diastolic pressure.  5. Persistent intracoronary thrombus requiring a distal stent      placement.  I used a drug-eluting stent after placement of bare      metal stent due to the overall length of  extended segment and      greater concern for diffuse in-stent restenosis with this total      length of bare metal stent.  I had hoped to avoid long-term Plavix,      but this has not been possible.      Francisca December, M.D.  Electronically Signed     JHE/MEDQ  D:  01/23/2008  T:  01/24/2008  Job:  [id number]   cc:   Caryn Bee L. Little, M.D.

## 2011-02-17 NOTE — Discharge Summary (Signed)
Jeremiah Short, Jeremiah Short               ACCOUNT NO.:  [id number]   MEDICAL RECORD NO.:  [id number]          PATIENT TYPE:  INP   LOCATION:  3712                         FACILITY:  [facility]   PHYSICIAN:  Francisca December, M.D.  DATE OF BIRTH:  Dec 29, 1960   DATE OF ADMISSION:  01/23/2008  DATE OF DISCHARGE:  01/26/2008                               DISCHARGE SUMMARY   DISCHARGE DIAGNOSES:  1. Acute anterior myocardial infarction.  2. Dyslipidemia.  3. Smoker, cessation counseling provided.  4. Alcohol abuse.  5. Long-term medication use.   Jeremiah Short is a 50 year old male patient who was admitted on January 23, 2008 after 1-week history of intermittent chest pain.  Over the 24 hours  prior to his admission, he had chest pain that waxed and waned and  because he wanted to go out of town over the weekend, he sought medical  attention and went to [facility].  An EKG at that time  showed an acute anterior myocardial infarction and he was instructed the  EMS was going to be called, but he refused the EMS transport and then he  found a waiver.  He then drove himself to the [facility] where an ST-  segment elevated myocardial infarction in the anterior leads was noted,  and he was taken emergently to the cardiac catheterization lab.   In the catheterization lab, he was found to have a total mid-LAD lesion,  and this lesion was intervened upon with a bare-metal stent under the  care of Dr. Corliss Marcus.  The patient tolerated the procedure well.   Over the next several days, he was monitored in the hospital and he  seemed to be doing well.  He does drink half a pint of alcohol daily and  alcohol withdrawal measures were implemented.  He is a smoker and  smoking cessation counseling was provided.   A 2D echo was performed and this showed a slightly decreased LV function  with an EF of 45%-50% with akinesis of the mid distal septal wall.  There was akinesis of the mid distal  anterior wall and akinesis of the  periapical wall.  Contractile was otherwise normal.  There was mild-to-  moderate mitral regurgitation.   Lab studies during the hospital stay included sodium 137, potassium 3.6,  BUN 8, creatinine 0.89, troponin of 33.72.  Hemoglobin 14.6, hematocrit  41.2, platelets 191, and white count 8.8.   DISCHARGE MEDICATIONS:  1. Enteric-coated aspirin 325 mg 1 tablet daily.  2. Plavix 75 mg a day.  3. Sublingual nitroglycerin p.r.n. chest pain.  4. Simvastatin 40 mg a day for cholesterol.  5. Atenolol 25 mg a day.  6. Lisinopril 5 mg a day.  7. Chantix 0.5 mg twice a day.  8. Ritalin 20 mg a day.   The patient is to remain on a low-sodium heart-healthy diet.  Increase  activity slowly.  Follow up with Dr. Amil Amen in 1-2 weeks.  He is to  call for this appointment.  Recent.      Guy Franco, P.A.      John H.  Amil Amen, M.D.  Electronically Signed    LB/MEDQ  D:  02/17/2008  T:  02/18/2008  Job:  [id number]   cc:   Francisca December, M.D.

## 2011-06-27 LAB — POCT I-STAT, CHEM 8
BUN: 10
Creatinine, Ser: 1
Glucose, Bld: 105 — ABNORMAL HIGH
HCT: 45
Hemoglobin: 15.3
Hemoglobin: 17.7 — ABNORMAL HIGH
Potassium: 3.5
Sodium: 138
Sodium: 142
TCO2: 25
TCO2: 27

## 2011-06-27 LAB — TROPONIN I: Troponin I: 33.72

## 2011-06-27 LAB — BASIC METABOLIC PANEL
Chloride: 105
GFR calc Af Amer: >60
Potassium: 3.6

## 2011-06-27 LAB — DIFFERENTIAL
Basophils Absolute: 0.1
Eosinophils Relative: 0
Lymphocytes Relative: 17
Lymphs Abs: 1.8
Monocytes Absolute: 1.2 — ABNORMAL HIGH
Monocytes Relative: 12
Neutro Abs: 7.4

## 2011-06-27 LAB — CBC
HCT: 41.2
HCT: 50.3
Hemoglobin: 14.6
Hemoglobin: 16.9
MCV: 95.2
RBC: 4.32
RBC: 5.24
RDW: 14.2
WBC: 10.5
WBC: 8.8

## 2011-06-27 LAB — APTT: aPTT: 28

## 2011-06-27 LAB — LIPID PANEL: VLDL: 15

## 2011-06-27 LAB — POCT CARDIAC MARKERS
CKMB, poc: 58.5
Myoglobin, poc: 427
Operator id: 264031

## 2013-05-08 ENCOUNTER — Encounter (INDEPENDENT_AMBULATORY_CARE_PROVIDER_SITE_OTHER): Payer: Self-pay | Admitting: Surgery

## 2013-05-08 ENCOUNTER — Telehealth (INDEPENDENT_AMBULATORY_CARE_PROVIDER_SITE_OTHER): Payer: Self-pay | Admitting: Surgery

## 2013-05-08 ENCOUNTER — Ambulatory Visit (INDEPENDENT_AMBULATORY_CARE_PROVIDER_SITE_OTHER): Payer: BC Managed Care – PPO | Admitting: Surgery

## 2013-05-08 VITALS — BP 124/66 | HR 72 | Temp 97.7°F | Resp 14 | Ht 70.5 in | Wt 182.8 lb

## 2013-05-08 DIAGNOSIS — K4090 Unilateral inguinal hernia, without obstruction or gangrene, not specified as recurrent: Secondary | ICD-10-CM

## 2013-05-08 NOTE — Progress Notes (Signed)
Patient ID: Jeremiah Short, male   DOB: July 01, 1961, 52 y.o.   MRN: [id number]  Chief Complaint  Patient presents with  . New Evaluation    eval ING hernia    HPI Jeremiah Short is a 52 y.o. male.  Patient presents with left inguinal hernia. This occurred at work after some lifting a few months ago. It is getting larger and causing mild to moderate discomfort. It is located in the left groin and projects down into his left hemi-scrotum. It slides in and out. It causes mild to moderate discomfort described as aching. No nausea or vomiting. No back pain. HPI  History reviewed. No pertinent past medical history.  Past Surgical History  Procedure Laterality Date  . Cardiac catheterization  2009    placed 2 stents  . Partial colectomy  2011    Family History  Problem Relation Age of Onset  . Cancer Mother   . Heart disease Father     has pacemaker  . Cancer Sister     breast    Social History History  Substance Use Topics  . Smoking status: Current Every Day Smoker -- 1.00 packs/day  . Smokeless tobacco: Never Used  . Alcohol Use: No    Allergies  Allergen Reactions  . Morphine And Related     dont remember the symptoms    Current Outpatient Prescriptions  Medication Sig Dispense Refill  . aspirin 81 MG tablet Take 81 mg by mouth daily.      [name] lisinopril (PRINIVIL,ZESTRIL) 5 MG tablet Take 5 mg by mouth daily.      . simvastatin (ZOCOR) 20 MG tablet Take 20 mg by mouth every evening.       No current facility-administered medications for this visit.    Review of Systems Review of Systems  Constitutional: Negative for fever, chills and unexpected weight change.  HENT: Negative for hearing loss, congestion, sore throat, trouble swallowing and voice change.   Eyes: Negative for visual disturbance.  Respiratory: Positive for cough. Negative for wheezing.   Cardiovascular: Negative for chest pain, palpitations and leg swelling.  Gastrointestinal: Negative for nausea,  vomiting, abdominal pain, diarrhea, constipation, blood in stool, abdominal distention, anal bleeding and rectal pain.  Genitourinary: Negative for hematuria and difficulty urinating.  Musculoskeletal: Negative for arthralgias.  Skin: Negative for rash and wound.  Neurological: Negative for seizures, syncope, weakness and headaches.  Hematological: Negative for adenopathy. Does not bruise/bleed easily.  Psychiatric/Behavioral: Negative for confusion.    Blood pressure 124/66, pulse 72, temperature 97.7 F (36.5 C), temperature source Temporal, resp. rate 14, height 5' 10.5" (1.791 m), weight 182 lb 12.8 oz (82.918 kg).  Physical Exam Physical Exam  Constitutional: He is oriented to person, place, and time. He appears well-developed and well-nourished.  HENT:  Head: Normocephalic and atraumatic.  Eyes: EOM are normal. Pupils are equal, round, and reactive to light.  Neck: Normal range of motion. Neck supple.  Cardiovascular: Normal rate and regular rhythm.   Pulmonary/Chest: Effort normal and breath sounds normal.  Abdominal: Soft. Bowel sounds are normal. A hernia is present. Hernia confirmed positive in the left inguinal area.    Musculoskeletal: Normal range of motion.  Neurological: He is alert and oriented to person, place, and time.  Skin: Skin is warm and dry.  Psychiatric: He has a normal mood and affect. His behavior is normal. Judgment and thought content normal.    Data Reviewed none  Assessment    Left inguinal hernia reducible with scrotal component  Plan    Recommend repair left inguinal hernia mesh. He will check his schedule and coordinate. Open and laparoscopic repairs are possible. Risks benefits and alternative therapies discussed. Long term expectations discussed.The risk of hernia repair include bleeding,  Infection,   Recurrence of the hernia,  Mesh use, chronic pain,  Organ injury,  Bowel injury,  Bladder injury,   nerve injury with numbness around the  incision,  Death,  and worsening of preexisting  medical problems.  The alternatives to surgery have been discussed as well..  Long term expectations of both operative and non operative treatments have been discussed.   The patient agrees to proceed. Patient told of risk of tobacco use with hernia surgery. High-risk of chronic pain and many complications noted. Smoking cessation recommended. He is aware of the above.       Judia Arnott A. 05/08/2013, 4:53 PM

## 2013-05-08 NOTE — Patient Instructions (Signed)

## 2013-05-08 NOTE — Telephone Encounter (Signed)
Advised patient of financial responsibility, per patient will call back to schedule  °

## 2013-06-10 NOTE — Progress Notes (Signed)
Pt states he is resch.

## 2013-06-12 ENCOUNTER — Telehealth (INDEPENDENT_AMBULATORY_CARE_PROVIDER_SITE_OTHER): Payer: Self-pay | Admitting: Surgery

## 2013-06-12 NOTE — Telephone Encounter (Signed)
Pt cancelled sx 06/17/13 due to attorneys advise / workers comp/ [employer]

## 2013-06-17 ENCOUNTER — Ambulatory Visit (HOSPITAL_BASED_OUTPATIENT_CLINIC_OR_DEPARTMENT_OTHER): Admission: RE | Admit: 2013-06-17 | Payer: BC Managed Care – PPO | Source: Ambulatory Visit | Admitting: Surgery

## 2013-06-17 ENCOUNTER — Encounter (HOSPITAL_BASED_OUTPATIENT_CLINIC_OR_DEPARTMENT_OTHER): Admission: RE | Payer: Self-pay | Source: Ambulatory Visit

## 2013-06-17 SURGERY — REPAIR, HERNIA, INGUINAL, ADULT
Anesthesia: General | Laterality: Left

## 2013-07-07 ENCOUNTER — Encounter (INDEPENDENT_AMBULATORY_CARE_PROVIDER_SITE_OTHER): Payer: BC Managed Care – PPO | Admitting: Surgery

## 2013-08-07 ENCOUNTER — Other Ambulatory Visit: Payer: Self-pay

## 2013-08-20 ENCOUNTER — Ambulatory Visit (INDEPENDENT_AMBULATORY_CARE_PROVIDER_SITE_OTHER): Payer: BC Managed Care – PPO | Admitting: Surgery

## 2013-08-20 ENCOUNTER — Encounter (INDEPENDENT_AMBULATORY_CARE_PROVIDER_SITE_OTHER): Payer: Self-pay | Admitting: Surgery

## 2013-08-20 VITALS — BP 110/66 | HR 64 | Temp 98.2°F | Resp 14 | Ht 70.5 in | Wt 191.0 lb

## 2013-08-20 DIAGNOSIS — K4090 Unilateral inguinal hernia, without obstruction or gangrene, not specified as recurrent: Secondary | ICD-10-CM

## 2013-08-20 NOTE — Progress Notes (Signed)
Patient ID: Jeremiah Short, male   DOB: 1961-04-08, 52 y.o.   MRN: [id number]  Chief Complaint  Patient presents with  . Routine Post Op    reck LIH / discuss sx    HPI Jeremiah Short is a 52 y.o. male.  Patient presents with left inguinal hernia. This occurred at work after some lifting a few months ago. It is getting larger and causing mild to moderate discomfort. It is located in the left groin and projects down into his left hemi-scrotum. It slides in and out. It causes mild to moderate discomfort described as aching. No nausea or vomiting. No back pain. Pt here to schedule surgery.  HPI  History reviewed. No pertinent past medical history.  Past Surgical History  Procedure Laterality Date  . Cardiac catheterization  2009    placed 2 stents  . Partial colectomy  2011    Family History  Problem Relation Age of Onset  . Cancer Mother   . Heart disease Father     has pacemaker  . Cancer Sister     breast    Social History History  Substance Use Topics  . Smoking status: Current Every Day Smoker -- 1.00 packs/day  . Smokeless tobacco: Never Used  . Alcohol Use: No    Allergies  Allergen Reactions  . Morphine And Related     dont remember the symptoms    Current Outpatient Prescriptions  Medication Sig Dispense Refill  . aspirin 81 MG tablet Take 81 mg by mouth daily.      [name] lisinopril (PRINIVIL,ZESTRIL) 5 MG tablet Take 5 mg by mouth daily.      . simvastatin (ZOCOR) 20 MG tablet Take 20 mg by mouth every evening.       No current facility-administered medications for this visit.    Review of Systems Review of Systems  Constitutional: Negative for fever, chills and unexpected weight change.  HENT: Negative for hearing loss, congestion, sore throat, trouble swallowing and voice change.   Eyes: Negative for visual disturbance.  Respiratory: Positive for cough. Negative for wheezing.   Cardiovascular: Negative for chest pain, palpitations and leg swelling.   Gastrointestinal: Negative for nausea, vomiting, abdominal pain, diarrhea, constipation, blood in stool, abdominal distention, anal bleeding and rectal pain.  Genitourinary: Negative for hematuria and difficulty urinating.  Musculoskeletal: Negative for arthralgias.  Skin: Negative for rash and wound.  Neurological: Negative for seizures, syncope, weakness and headaches.  Hematological: Negative for adenopathy. Does not bruise/bleed easily.  Psychiatric/Behavioral: Negative for confusion.    Blood pressure 110/66, pulse 64, temperature 98.2 F (36.8 C), temperature source Temporal, resp. rate 14, height 5' 10.5" (1.791 m), weight 191 lb (86.637 kg).  Physical Exam Physical Exam  Constitutional: He is oriented to person, place, and time. He appears well-developed and well-nourished.  HENT:  Head: Normocephalic and atraumatic.  Eyes: EOM are normal. Pupils are equal, round, and reactive to light.  Neck: Normal range of motion. Neck supple.  Cardiovascular: Normal rate and regular rhythm.   Pulmonary/Chest: Effort normal and breath sounds normal.  Abdominal: Soft. Bowel sounds are normal. A hernia is present. Hernia confirmed positive in the left inguinal area.    Musculoskeletal: Normal range of motion.  Neurological: He is alert and oriented to person, place, and time.  Skin: Skin is warm and dry.  Psychiatric: He has a normal mood and affect. His behavior is normal. Judgment and thought content normal.    Data Reviewed none  Assessment  Left inguinal hernia reducible with scrotal component    Plan   open LIH repair  With mesh Recommend repair left inguinal hernia mesh. He will check his schedule and coordinate. Open and laparoscopic repairs are possible. Risks benefits and alternative therapies discussed. Long term expectations discussed.The risk of hernia repair include bleeding,  Infection,   Recurrence of the hernia,  Mesh use, chronic pain,  Organ injury,  Bowel  injury,  Bladder injury,   nerve injury with numbness around the incision,  Death,  and worsening of preexisting  medical problems.  The alternatives to surgery have been discussed as well..  Long term expectations of both operative and non operative treatments have been discussed.   The patient agrees to proceed. Patient told of risk of tobacco use with hernia surgery. High-risk of chronic pain and many complications noted. Smoking cessation recommended. He is aware of the above.       Renuka Farfan A. 08/20/2013, 9:40 AM

## 2013-08-20 NOTE — Patient Instructions (Signed)
Inguinal Hernia, Adult  °Care After °Refer to this sheet in the next few weeks. These discharge instructions provide you with general information on caring for yourself after you leave the hospital. Your caregiver may also give you specific instructions. Your treatment has been planned according to the most current medical practices available, but unavoidable complications sometimes occur. If you have any problems or questions after discharge, please call your caregiver. °HOME CARE INSTRUCTIONS °· Put ice on the operative site. °· Put ice in a plastic bag. °· Place a towel between your skin and the bag. °· Leave the ice on for 15-20 minutes at a time, 03-04 times a day while awake. °· Change bandages (dressings) as directed. °· Keep the wound dry and clean. The wound may be washed gently with soap and water. Gently blot or dab the wound dry. It is okay to take showers 24 to 48 hours after surgery. Do not take baths, use swimming pools, or use hot tubs for 10 days, or as directed by your caregiver. °· Only take over-the-counter or prescription medicines for pain, discomfort, or fever as directed by your caregiver. °· Continue your normal diet as directed. °· Do not lift anything more than 10 pounds or play contact sports for 3 weeks, or as directed. °SEEK MEDICAL CARE IF: °· There is redness, swelling, or increasing pain in the wound. °· There is fluid (pus) coming from the wound. °· There is drainage from a wound lasting longer than 1 day. °· You have an oral temperature above 102° F (38.9° C). °· You notice a bad smell coming from the wound or dressing. °· The wound breaks open after the stitches (sutures) have been removed. °· You notice increasing pain in the shoulders (shoulder strap areas). °· You develop dizzy episodes or fainting while standing. °· You feel sick to your stomach (nauseous) or throw up (vomit). °SEEK IMMEDIATE MEDICAL CARE IF: °· You develop a rash. °· You have difficulty breathing. °· You  develop a reaction or have side effects to medicines you were given. °MAKE SURE YOU:  °· Understand these instructions. °· Will watch your condition. °· Will get help right away if you are not doing well or get worse. °Document Released: 10/19/2006 Document Revised: 12/11/2011 Document Reviewed: 08/18/2009 °[employer]® Patient Information ©2014 [employer]. ° °

## 2013-09-11 ENCOUNTER — Encounter (HOSPITAL_BASED_OUTPATIENT_CLINIC_OR_DEPARTMENT_OTHER): Payer: Self-pay | Admitting: *Deleted

## 2013-09-11 NOTE — Progress Notes (Signed)
To come in for cmet-had ekg2/14-sees dr skains-hx stent 2009-echo 2011 good-he works and no problems-does still smoke

## 2013-09-15 ENCOUNTER — Encounter (HOSPITAL_BASED_OUTPATIENT_CLINIC_OR_DEPARTMENT_OTHER)
Admission: RE | Admit: 2013-09-15 | Discharge: 2013-09-15 | Disposition: A | Discharge status: Home / Self Care | Payer: BC Managed Care – PPO | Source: Ambulatory Visit | Attending: Surgery | Admitting: Surgery

## 2013-09-15 ENCOUNTER — Other Ambulatory Visit (INDEPENDENT_AMBULATORY_CARE_PROVIDER_SITE_OTHER): Payer: Self-pay | Admitting: Surgery

## 2013-09-15 LAB — COMPREHENSIVE METABOLIC PANEL
ALT: 22 U/L (ref 0–53)
AST: 19 U/L (ref 0–37)
Albumin: 4 g/dL (ref 3.5–5.2)
Alkaline Phosphatase: 58 U/L (ref 39–117)
BUN: 16 mg/dL (ref 6–23)
Calcium: 9.2 mg/dL (ref 8.4–10.5)
Chloride: 100 mEq/L (ref 96–112)
GFR calc Af Amer: >90 mL/min (ref >90)
Potassium: 4.2 mEq/L (ref 3.5–5.1)
Sodium: 141 mEq/L (ref 135–145)
Total Bilirubin: 0.3 mg/dL (ref 0.3–1.2)

## 2013-09-16 ENCOUNTER — Encounter (HOSPITAL_BASED_OUTPATIENT_CLINIC_OR_DEPARTMENT_OTHER): Payer: Self-pay | Admitting: Anesthesiology

## 2013-09-16 ENCOUNTER — Encounter (HOSPITAL_BASED_OUTPATIENT_CLINIC_OR_DEPARTMENT_OTHER): Payer: BC Managed Care – PPO | Admitting: Anesthesiology

## 2013-09-16 ENCOUNTER — Ambulatory Visit (HOSPITAL_BASED_OUTPATIENT_CLINIC_OR_DEPARTMENT_OTHER)
Admission: RE | Admit: 2013-09-16 | Discharge: 2013-09-16 | Disposition: A | Discharge status: Home / Self Care | Payer: BC Managed Care – PPO | Source: Ambulatory Visit | Attending: Surgery | Admitting: Surgery

## 2013-09-16 ENCOUNTER — Ambulatory Visit (HOSPITAL_BASED_OUTPATIENT_CLINIC_OR_DEPARTMENT_OTHER): Payer: BC Managed Care – PPO | Admitting: Anesthesiology

## 2013-09-16 ENCOUNTER — Encounter (HOSPITAL_BASED_OUTPATIENT_CLINIC_OR_DEPARTMENT_OTHER)
Admission: RE | Disposition: A | Discharge status: Home / Self Care | Payer: Self-pay | Source: Ambulatory Visit | Attending: Surgery

## 2013-09-16 DIAGNOSIS — K4090 Unilateral inguinal hernia, without obstruction or gangrene, not specified as recurrent: Secondary | ICD-10-CM

## 2013-09-16 DIAGNOSIS — Z9861 Coronary angioplasty status: Secondary | ICD-10-CM | POA: Insufficient documentation

## 2013-09-16 DIAGNOSIS — I10 Essential (primary) hypertension: Secondary | ICD-10-CM | POA: Insufficient documentation

## 2013-09-16 DIAGNOSIS — Z01812 Encounter for preprocedural laboratory examination: Secondary | ICD-10-CM | POA: Insufficient documentation

## 2013-09-16 DIAGNOSIS — I252 Old myocardial infarction: Secondary | ICD-10-CM | POA: Insufficient documentation

## 2013-09-16 DIAGNOSIS — I2510 Atherosclerotic heart disease of native coronary artery without angina pectoris: Secondary | ICD-10-CM | POA: Insufficient documentation

## 2013-09-16 DIAGNOSIS — F17200 Nicotine dependence, unspecified, uncomplicated: Secondary | ICD-10-CM | POA: Insufficient documentation

## 2013-09-16 HISTORY — DX: Presence of spectacles and contact lenses: Z97.3

## 2013-09-16 HISTORY — DX: Nicotine dependence, unspecified, uncomplicated: F17.200

## 2013-09-16 HISTORY — DX: Alcohol abuse, in remission: F10.11

## 2013-09-16 HISTORY — DX: Atherosclerotic heart disease of native coronary artery without angina pectoris: I25.10

## 2013-09-16 HISTORY — DX: Essential (primary) hypertension: I10

## 2013-09-16 HISTORY — PX: INGUINAL HERNIA REPAIR: SUR1180

## 2013-09-16 HISTORY — DX: Gastro-esophageal reflux disease without esophagitis: K21.9

## 2013-09-16 HISTORY — DX: Presence of coronary angioplasty implant and graft: Z95.5

## 2013-09-16 HISTORY — DX: Acute myocardial infarction, unspecified: I21.9

## 2013-09-16 HISTORY — PX: INGUINAL HERNIA REPAIR: SHX194

## 2013-09-16 SURGERY — REPAIR, HERNIA, INGUINAL, ADULT
Anesthesia: General | Site: Groin | Laterality: Left

## 2013-09-16 MED ORDER — BUPIVACAINE-EPINEPHRINE PF 0.5-1:200000 % IJ SOLN
INTRAMUSCULAR | Status: DC | PRN
  Administered 2013-09-16: 25 mL via PERINEURAL

## 2013-09-16 MED ORDER — DEXTROSE 5 % IV SOLN
3.0000 g | INTRAVENOUS | Status: AC
  Administered 2013-09-16: 2 g via INTRAVENOUS

## 2013-09-16 MED ORDER — LIDOCAINE HCL (CARDIAC) 20 MG/ML IV SOLN
INTRAVENOUS | Status: DC | PRN
  Administered 2013-09-16: 100 mg via INTRAVENOUS

## 2013-09-16 MED ORDER — PROPOFOL 10 MG/ML IV BOLUS
INTRAVENOUS | Status: DC | PRN
  Administered 2013-09-16: 200 mg via INTRAVENOUS

## 2013-09-16 MED ORDER — FENTANYL CITRATE 0.05 MG/ML IJ SOLN
INTRAMUSCULAR | Status: AC
  Filled 2013-09-16: qty 6

## 2013-09-16 MED ORDER — CHLORHEXIDINE GLUCONATE 4 % EX LIQD: 1.0000 "application " | Freq: Once | CUTANEOUS | Status: DC

## 2013-09-16 MED ORDER — OXYCODONE HCL 5 MG/5ML PO SOLN: 5.0000 mg | Freq: Once | ORAL | Status: AC | PRN

## 2013-09-16 MED ORDER — OXYCODONE HCL 5 MG PO TABS
ORAL_TABLET | ORAL | Status: AC
  Filled 2013-09-16: qty 1

## 2013-09-16 MED ORDER — MIDAZOLAM HCL 2 MG/2ML IJ SOLN
INTRAMUSCULAR | Status: AC
  Filled 2013-09-16: qty 2

## 2013-09-16 MED ORDER — HYDROMORPHONE HCL PF 1 MG/ML IJ SOLN
INTRAMUSCULAR | Status: AC
  Filled 2013-09-16: qty 1

## 2013-09-16 MED ORDER — ONDANSETRON HCL 4 MG/2ML IJ SOLN
INTRAMUSCULAR | Status: DC | PRN
  Administered 2013-09-16: 4 mg via INTRAVENOUS

## 2013-09-16 MED ORDER — SUCCINYLCHOLINE CHLORIDE 20 MG/ML IJ SOLN
INTRAMUSCULAR | Status: DC | PRN
  Administered 2013-09-16: 100 mg via INTRAVENOUS

## 2013-09-16 MED ORDER — TRAMADOL HCL 50 MG PO TABS: 50.0000 mg | ORAL_TABLET | Freq: Four times a day (QID) | ORAL | Status: DC | PRN

## 2013-09-16 MED ORDER — FENTANYL CITRATE 0.05 MG/ML IJ SOLN
INTRAMUSCULAR | Status: DC | PRN
  Administered 2013-09-16: 25 ug via INTRAVENOUS

## 2013-09-16 MED ORDER — FENTANYL CITRATE 0.05 MG/ML IJ SOLN
50.0000 ug | INTRAMUSCULAR | Status: DC | PRN
  Administered 2013-09-16: 100 ug via INTRAVENOUS

## 2013-09-16 MED ORDER — DEXAMETHASONE SODIUM PHOSPHATE 4 MG/ML IJ SOLN
INTRAMUSCULAR | Status: DC | PRN
  Administered 2013-09-16: 10 mg via INTRAVENOUS

## 2013-09-16 MED ORDER — ONDANSETRON HCL 4 MG/2ML IJ SOLN: 4.0000 mg | Freq: Once | INTRAMUSCULAR | Status: DC | PRN

## 2013-09-16 MED ORDER — BUPIVACAINE-EPINEPHRINE 0.25% -1:200000 IJ SOLN
INTRAMUSCULAR | Status: DC | PRN
  Administered 2013-09-16: 10 mL

## 2013-09-16 MED ORDER — LACTATED RINGERS IV SOLN
INTRAVENOUS | Status: DC
  Administered 2013-09-16 (×2): via INTRAVENOUS
  Administered 2013-09-16: 10 mL/h via INTRAVENOUS

## 2013-09-16 MED ORDER — OXYCODONE-ACETAMINOPHEN 5-325 MG PO TABS: 1.0000 | ORAL_TABLET | ORAL | Status: DC | PRN

## 2013-09-16 MED ORDER — CEFAZOLIN SODIUM-DEXTROSE 2-3 GM-% IV SOLR
INTRAVENOUS | Status: AC
  Filled 2013-09-16: qty 50

## 2013-09-16 MED ORDER — HYDROMORPHONE HCL PF 1 MG/ML IJ SOLN
0.2500 mg | INTRAMUSCULAR | Status: DC | PRN
  Administered 2013-09-16 (×4): 0.5 mg via INTRAVENOUS

## 2013-09-16 MED ORDER — FENTANYL CITRATE 0.05 MG/ML IJ SOLN
INTRAMUSCULAR | Status: AC
  Filled 2013-09-16: qty 2

## 2013-09-16 MED ORDER — OXYCODONE HCL 5 MG PO TABS
5.0000 mg | ORAL_TABLET | Freq: Once | ORAL | Status: AC | PRN
  Administered 2013-09-16: 5 mg via ORAL

## 2013-09-16 MED ORDER — MIDAZOLAM HCL 2 MG/2ML IJ SOLN
1.0000 mg | INTRAMUSCULAR | Status: DC | PRN
  Administered 2013-09-16: 2 mg via INTRAVENOUS

## 2013-09-16 SURGICAL SUPPLY — 51 items
ADH SKN CLS APL DERMABOND .7 (GAUZE/BANDAGES/DRESSINGS) ×1
BLADE SURG 15 STRL LF DISP TIS (BLADE) ×1 IMPLANT
BLADE SURG 15 STRL SS (BLADE) ×2
BLADE SURG ROTATE 9660 (MISCELLANEOUS) ×1 IMPLANT
CANISTER SUCT 1200ML W/VALVE (MISCELLANEOUS) ×2 IMPLANT
CHLORAPREP W/TINT 26ML (MISCELLANEOUS) ×2 IMPLANT
COVER MAYO STAND STRL (DRAPES) ×2 IMPLANT
COVER TABLE BACK 60X90 (DRAPES) ×2 IMPLANT
DECANTER SPIKE VIAL GLASS SM (MISCELLANEOUS) ×2 IMPLANT
DERMABOND ADVANCED (GAUZE/BANDAGES/DRESSINGS) ×1
DERMABOND ADVANCED .7 DNX12 (GAUZE/BANDAGES/DRESSINGS) ×1 IMPLANT
DRAIN PENROSE 1/2X12 LTX STRL (WOUND CARE) ×2 IMPLANT
DRAPE LAPAROTOMY TRNSV 102X78 (DRAPE) ×2 IMPLANT
DRAPE UTILITY XL STRL (DRAPES) ×2 IMPLANT
ELECT COATED BLADE 2.86 ST (ELECTRODE) ×2 IMPLANT
ELECT REM PT RETURN 9FT ADLT (ELECTROSURGICAL) ×2
ELECTRODE REM PT RTRN 9FT ADLT (ELECTROSURGICAL) ×1 IMPLANT
GAUZE SPONGE 4X4 16PLY XRAY LF (GAUZE/BANDAGES/DRESSINGS) IMPLANT
GLOVE BIO SURGEON STRL SZ 6.5 (GLOVE) ×1 IMPLANT
GLOVE BIOGEL PI IND STRL 7.0 (GLOVE) IMPLANT
GLOVE BIOGEL PI IND STRL 8 (GLOVE) ×1 IMPLANT
GLOVE BIOGEL PI INDICATOR 7.0 (GLOVE) ×1
GLOVE BIOGEL PI INDICATOR 8 (GLOVE) ×1
GLOVE ECLIPSE 8.0 STRL XLNG CF (GLOVE) ×2 IMPLANT
GOWN PREVENTION PLUS XLARGE (GOWN DISPOSABLE) ×4 IMPLANT
MESH HERNIA SYS ULTRAPRO LRG (Mesh General) ×1 IMPLANT
NDL HYPO 25X1 1.5 SAFETY (NEEDLE) ×1 IMPLANT
NEEDLE HYPO 25X1 1.5 SAFETY (NEEDLE) ×2 IMPLANT
NS IRRIG 1000ML POUR BTL (IV SOLUTION) ×1 IMPLANT
PACK BASIN DAY SURGERY FS (CUSTOM PROCEDURE TRAY) ×2 IMPLANT
PENCIL BUTTON HOLSTER BLD 10FT (ELECTRODE) ×2 IMPLANT
SLEEVE SCD COMPRESS KNEE MED (MISCELLANEOUS) ×2 IMPLANT
SPONGE GAUZE 4X4 12PLY STER LF (GAUZE/BANDAGES/DRESSINGS) IMPLANT
SPONGE LAP 4X18 X RAY DECT (DISPOSABLE) ×2 IMPLANT
STAPLER VISISTAT 35W (STAPLE) IMPLANT
SUT MON AB 4-0 PC3 18 (SUTURE) ×2 IMPLANT
SUT NOVA 0 T19/GS 22DT (SUTURE) ×4 IMPLANT
SUT VIC AB 0 SH 27 (SUTURE) ×2 IMPLANT
SUT VIC AB 2-0 SH 27 (SUTURE) ×2
SUT VIC AB 2-0 SH 27XBRD (SUTURE) ×1 IMPLANT
SUT VIC AB 3-0 54X BRD REEL (SUTURE) IMPLANT
SUT VIC AB 3-0 BRD 54 (SUTURE)
SUT VICRYL 3-0 CR8 SH (SUTURE) ×2 IMPLANT
SUT VICRYL AB 2 0 TIE (SUTURE) IMPLANT
SUT VICRYL AB 2 0 TIES (SUTURE)
SYR CONTROL 10ML LL (SYRINGE) ×2 IMPLANT
TAPE HYPAFIX 4 X10 (GAUZE/BANDAGES/DRESSINGS) IMPLANT
TOWEL OR 17X24 6PK STRL BLUE (TOWEL DISPOSABLE) ×3 IMPLANT
TOWEL OR NON WOVEN STRL DISP B (DISPOSABLE) ×2 IMPLANT
TUBE CONNECTING 20X1/4 (TUBING) ×2 IMPLANT
YANKAUER SUCT BULB TIP NO VENT (SUCTIONS) ×2 IMPLANT

## 2013-09-16 NOTE — Anesthesia Postprocedure Evaluation (Signed)
  Anesthesia Post-op Note  Patient: Jeremiah Short  Procedure(s) Performed: Procedure(s): HERNIA REPAIR INGUINAL ADULT (Left)  Patient Location: PACU  Anesthesia Type:General and GA combined with regional for post-op pain  Level of Consciousness: awake, alert  and oriented  Airway and Oxygen Therapy: Patient Spontanous Breathing  Post-op Pain: mild  Post-op Assessment: Post-op Vital signs reviewed  Post-op Vital Signs: Reviewed  Complications: No apparent anesthesia complications

## 2013-09-16 NOTE — H&P (View-Only) (Signed)
Patient ID: Jeremiah Short, male   DOB: 04/22/1961, 52 y.o.   MRN: [id number]  Chief Complaint  Patient presents with  . Routine Post Op    reck LIH / discuss sx    HPI Jeremiah Short is a 52 y.o. male.  Patient presents with left inguinal hernia. This occurred at work after some lifting a few months ago. It is getting larger and causing mild to moderate discomfort. It is located in the left groin and projects down into his left hemi-scrotum. It slides in and out. It causes mild to moderate discomfort described as aching. No nausea or vomiting. No back pain. Pt here to schedule surgery.  HPI  History reviewed. No pertinent past medical history.  Past Surgical History  Procedure Laterality Date  . Cardiac catheterization  2009    placed 2 stents  . Partial colectomy  2011    Family History  Problem Relation Age of Onset  . Cancer Mother   . Heart disease Father     has pacemaker  . Cancer Sister     breast    Social History History  Substance Use Topics  . Smoking status: Current Every Day Smoker -- 1.00 packs/day  . Smokeless tobacco: Never Used  . Alcohol Use: No    Allergies  Allergen Reactions  . Morphine And Related     dont remember the symptoms    Current Outpatient Prescriptions  Medication Sig Dispense Refill  . aspirin 81 MG tablet Take 81 mg by mouth daily.      . lisinopril (PRINIVIL,ZESTRIL) 5 MG tablet Take 5 mg by mouth daily.      . simvastatin (ZOCOR) 20 MG tablet Take 20 mg by mouth every evening.       No current facility-administered medications for this visit.    Review of Systems Review of Systems  Constitutional: Negative for fever, chills and unexpected weight change.  HENT: Negative for hearing loss, congestion, sore throat, trouble swallowing and voice change.   Eyes: Negative for visual disturbance.  Respiratory: Positive for cough. Negative for wheezing.   Cardiovascular: Negative for chest pain, palpitations and leg swelling.   Gastrointestinal: Negative for nausea, vomiting, abdominal pain, diarrhea, constipation, blood in stool, abdominal distention, anal bleeding and rectal pain.  Genitourinary: Negative for hematuria and difficulty urinating.  Musculoskeletal: Negative for arthralgias.  Skin: Negative for rash and wound.  Neurological: Negative for seizures, syncope, weakness and headaches.  Hematological: Negative for adenopathy. Does not bruise/bleed easily.  Psychiatric/Behavioral: Negative for confusion.    Blood pressure 110/66, pulse 64, temperature 98.2 F (36.8 C), temperature source Temporal, resp. rate 14, height 5' 10.5" (1.791 m), weight 191 lb (86.637 kg).  Physical Exam Physical Exam  Constitutional: He is oriented to person, place, and time. He appears well-developed and well-nourished.  HENT:  Head: Normocephalic and atraumatic.  Eyes: EOM are normal. Pupils are equal, round, and reactive to light.  Neck: Normal range of motion. Neck supple.  Cardiovascular: Normal rate and regular rhythm.   Pulmonary/Chest: Effort normal and breath sounds normal.  Abdominal: Soft. Bowel sounds are normal. A hernia is present. Hernia confirmed positive in the left inguinal area.    Musculoskeletal: Normal range of motion.  Neurological: He is alert and oriented to person, place, and time.  Skin: Skin is warm and dry.  Psychiatric: He has a normal mood and affect. His behavior is normal. Judgment and thought content normal.    Data Reviewed none  Assessment      Left inguinal hernia reducible with scrotal component    Plan   open LIH repair  With mesh Recommend repair left inguinal hernia mesh. He will check his schedule and coordinate. Open and laparoscopic repairs are possible. Risks benefits and alternative therapies discussed. Long term expectations discussed.The risk of hernia repair include bleeding,  Infection,   Recurrence of the hernia,  Mesh use, chronic pain,  Organ injury,  Bowel  injury,  Bladder injury,   nerve injury with numbness around the incision,  Death,  and worsening of preexisting  medical problems.  The alternatives to surgery have been discussed as well..  Long term expectations of both operative and non operative treatments have been discussed.   The patient agrees to proceed. Patient told of risk of tobacco use with hernia surgery. High-risk of chronic pain and many complications noted. Smoking cessation recommended. He is aware of the above.       Rabon Scholle A. 08/20/2013, 9:40 AM    

## 2013-09-16 NOTE — Op Note (Signed)
Left Inguinal Hernia, Open, Procedure Note  With mesh  Indications: The patient presented with a history of a left, reducible  Inguinal hernia.The risk of hernia repair include bleeding,  Infection,   Recurrence of the hernia,  Mesh use, chronic pain,  Organ injury,  Bowel injury,  Bladder injury,   nerve injury with numbness around the incision,  Death,  and worsening of preexisting  medical problems.  The alternatives to surgery have been discussed as well..  Long term expectations of both operative and non operative treatments have been discussed.   The patient agrees to proceed.hernia.    Pre-operative Diagnosis: left reducible inguinal hernia  Post-operative Diagnosis: same (INDIRECT)  Surgeon: Harriette Bouillon A.   Assistants: none  Anesthesia: General endotracheal anesthesia and TEP block with 0.25% sensorcaine with epinephrine  ASA Class: 2  Procedure Details  The patient was seen again in the Holding Room. The risks, benefits, complications, treatment options, and expected outcomes were discussed with the patient. The possibilities of reaction to medication, pulmonary aspiration, perforation of viscus, bleeding, recurrent infection, the need for additional procedures, and development of a complication requiring transfusion or further operation were discussed with the patient and/or family. There was concurrence with the proposed plan, and informed consent was obtained. The site of surgery was properly noted/marked. The patient was taken to the Operating Room, identified as Jeremiah Short, and the procedure verified as hernia repair. A Time Out was held and the above information confirmed. Pt had a TEP block per anesthesia.   The patient was placed in the supine position and underwent induction of anesthesia, the lower abdomen and  Left groin was prepped and draped in the standard fashion, and 0.25 % sensorcaine with epinephrine was used to anesthetize the skin over the mid-portion of the  inguinal canal. A transverse incision was made. Dissection was carried through the soft tissue to expose the inguinal canal and inguinal ligament along its lower edge. The external oblique fascia was split along the course of its fibers, exposing the inguinal canal. The cord and nerve were looped using a Penrose drain and reflected out of the field. The  Large indefect was exposed and reduced out of the scrotum.  The sac was opened and omentum reduced without difficulty.  a piece of prolene hernia system ultrapro mesh was and placed into  the defect and deployed. Interupted 1-0 novafil  And vicryl suture was then used  to repair the defect, with the suture being sewn from the pubic tubercle inferiorly and superiorly along the canal to a level just beyond the internal ring. The mesh was split to allow passage of the cord  into the canal without entrapment. Ilioinguinal nerve divided since it was trapped under the mesh and I was concerned with postoperative pain.  The contents were then returned to canal and the external oblique fashion was then closed in a continuous fashion using 2-0 Vicryl suture taking care not to cause entrapment. Scarpa's layer closed with 3 0 vicryl and 4 0 monocryl used to close the skin.  Dermabond used for dressing.  Instrument, sponge, and needle counts were correct prior to closure and at the conclusion of the case.  Findings: Hernia as above  Estimated Blood Loss: Minimal         Drains: None         Total IV Fluids: 1200 mL         Specimens: none  Complications: None; patient tolerated the procedure well.         Disposition: PACU - hemodynamically stable.         Condition: stable

## 2013-09-16 NOTE — Interval H&P Note (Signed)
History and Physical Interval Note:  09/16/2013 8:06 AM  Jeremiah Short  has presented today for surgery, with the diagnosis of hernia  The various methods of treatment have been discussed with the patient and family. After consideration of risks, benefits and other options for treatment, the patient has consented to  Procedure(s): HERNIA REPAIR INGUINAL ADULT (Left) as a surgical intervention .  The patient's history has been reviewed, patient examined, no change in status, stable for surgery.  I have reviewed the patient's chart and labs.  Questions were answered to the patient's satisfaction.     Shikara Mcauliffe A.

## 2013-09-16 NOTE — Anesthesia Preprocedure Evaluation (Signed)
Anesthesia Evaluation  Patient identified by MRN, date of birth, ID band Patient awake    Reviewed: Allergy & Precautions, H&P , NPO status , Patient's Chart, lab work & pertinent test results  Airway Mallampati: I TM Distance: >3 FB Neck ROM: Full    Dental  (+) Teeth Intact and Dental Advisory Given   Pulmonary Current Smoker,  breath sounds clear to auscultation        Cardiovascular hypertension, Pt. on medications + CAD (2 stents in place working well) and + Past MI Rhythm:Regular Rate:Normal     Neuro/Psych    GI/Hepatic   Endo/Other    Renal/GU      Musculoskeletal   Abdominal   Peds  Hematology   Anesthesia Other Findings Good exercise tolerance.  Reproductive/Obstetrics                           Anesthesia Physical Anesthesia Plan  ASA: III  Anesthesia Plan: General   Post-op Pain Management:    Induction: Intravenous  Airway Management Planned: LMA  Additional Equipment:   Intra-op Plan:   Post-operative Plan: Extubation in OR  Informed Consent: I have reviewed the patients History and Physical, chart, labs and discussed the procedure including the risks, benefits and alternatives for the proposed anesthesia with the patient or authorized representative who has indicated his/her understanding and acceptance.   Dental advisory given  Plan Discussed with: CRNA, Anesthesiologist and Surgeon  Anesthesia Plan Comments:         Anesthesia Quick Evaluation

## 2013-09-16 NOTE — Progress Notes (Signed)
Assisted Dr. Crews with left, ultrasound guided, transabdominal plane block. Side rails up, monitors on throughout procedure. See vital signs in flow sheet. Tolerated Procedure well. 

## 2013-09-16 NOTE — Transfer of Care (Signed)
Immediate Anesthesia Transfer of Care Note  Patient: Jeremiah Short  Procedure(s) Performed: Procedure(s): HERNIA REPAIR INGUINAL ADULT (Left)  Patient Location: PACU  Anesthesia Type:General and GA combined with regional for post-op pain  Level of Consciousness: awake and alert   Airway & Oxygen Therapy: Patient Spontanous Breathing and Patient connected to face mask oxygen  Post-op Assessment: Report given to PACU RN and Post -op Vital signs reviewed and stable  Post vital signs: Reviewed and stable  Complications: No apparent anesthesia complications

## 2013-09-16 NOTE — Anesthesia Procedure Notes (Addendum)
Anesthesia Regional Block:  TAP block  Pre-Anesthetic Checklist: ,, timeout performed, Correct Patient, Correct Site, Correct Laterality, Correct Procedure, Correct Position, site marked, Risks and benefits discussed,  Surgical consent,  Pre-op evaluation,  At surgeon's request and post-op pain management  Laterality: Left and Lower  Prep: chloraprep       Needles:  Injection technique: Single-shot  Needle Type: Echogenic Needle     Needle Length: 9cm  Needle Gauge: 21 and 21 G    Additional Needles:  Procedures: ultrasound guided (picture in chart) TAP block Narrative:  Start time: 09/16/2013 8:20 AM End time: 09/16/2013 8:28 AM Injection made incrementally with aspirations every 5 mL.  Performed by: Personally  Anesthesiologist: Sheldon Silvan, MD   Anesthesia Procedure Note  Procedure Name: Intubation Date/Time: 09/16/2013 8:42 AM Performed by: Caren Macadam Pre-anesthesia Checklist: Patient identified, Emergency Drugs available, Suction available and Patient being monitored Patient Re-evaluated:Patient Re-evaluated prior to inductionOxygen Delivery Method: Circle System Utilized Preoxygenation: Pre-oxygenation with 100% oxygen Intubation Type: IV induction Ventilation: Mask ventilation without difficulty Laryngoscope Size: Miller and 2 Grade View: Grade I Tube type: Oral Tube size: 7.0 mm Number of attempts: 1 Airway Equipment and Method: stylet and oral airway Placement Confirmation: ETT inserted through vocal cords under direct vision,  positive ETCO2 and breath sounds checked- equal and bilateral Secured at: 24 cm Tube secured with: Tape Dental Injury: Teeth and Oropharynx as per pre-operative assessment

## 2013-09-17 ENCOUNTER — Encounter (INDEPENDENT_AMBULATORY_CARE_PROVIDER_SITE_OTHER): Payer: Self-pay

## 2013-09-17 ENCOUNTER — Telehealth (INDEPENDENT_AMBULATORY_CARE_PROVIDER_SITE_OTHER): Payer: Self-pay

## 2013-09-17 NOTE — Telephone Encounter (Signed)
LMOM> RTW note is in his chart. Please get fax number where it needs to be sent to.

## 2013-09-17 NOTE — Telephone Encounter (Signed)
Message copied by Brennan Bailey on Wed Sep 17, 2013 10:02 AM ------      Message from: Harriette Bouillon A      Created: Tue Sep 16, 2013  8:16 AM       Pt will need return to work note for Dec 26 and light duty until jan 6.  No lifting more than 15 lbs between dec 26 and jan 6.  You can call him tomorrow to get fax number at his employment. Thanks      TC ------

## 2013-09-18 ENCOUNTER — Encounter (HOSPITAL_BASED_OUTPATIENT_CLINIC_OR_DEPARTMENT_OTHER): Payer: Self-pay | Admitting: Surgery

## 2013-09-18 NOTE — Telephone Encounter (Signed)
Pt called back requesting work note be faxed to [employer] at [phone]. Note printed and faxed. Confirmation received.

## 2013-10-06 ENCOUNTER — Encounter (INDEPENDENT_AMBULATORY_CARE_PROVIDER_SITE_OTHER): Payer: Self-pay | Admitting: Surgery

## 2013-10-06 ENCOUNTER — Ambulatory Visit (INDEPENDENT_AMBULATORY_CARE_PROVIDER_SITE_OTHER): Payer: BC Managed Care – PPO | Admitting: Surgery

## 2013-10-06 VITALS — BP 132/82 | HR 96 | Temp 97.6°F | Resp 15 | Ht 70.0 in | Wt 194.0 lb

## 2013-10-06 DIAGNOSIS — Z9889 Other specified postprocedural states: Secondary | ICD-10-CM

## 2013-10-06 MED ORDER — TRAMADOL HCL 50 MG PO TABS: 50.0000 mg | ORAL_TABLET | Freq: Four times a day (QID) | ORAL | Status: DC | PRN

## 2013-10-06 NOTE — Patient Instructions (Signed)
See return to work sheet.  Return as needed.

## 2013-10-06 NOTE — Progress Notes (Signed)
Pt returns today after  Left inguinal hernia repair.  Pain is well controlled.  Bowels are functioning.  Wound is clean.  On exam:  Incision is clean /dry/intact.  Area is soft without signs of hernia recurrence.  Impression:  Status repair of hernia  Plan:  RTC PRN  limit lifting to 25 lbs for 2 weeks then no restrictions.

## 2013-10-18 ENCOUNTER — Other Ambulatory Visit: Payer: Self-pay | Admitting: *Deleted

## 2013-10-18 DIAGNOSIS — E7800 Pure hypercholesterolemia, unspecified: Secondary | ICD-10-CM

## 2013-10-18 DIAGNOSIS — Z79899 Other long term (current) drug therapy: Secondary | ICD-10-CM

## 2013-10-27 ENCOUNTER — Encounter: Payer: Self-pay | Admitting: *Deleted

## 2013-10-27 ENCOUNTER — Encounter: Payer: Self-pay | Admitting: Cardiology

## 2013-10-27 DIAGNOSIS — E7800 Pure hypercholesterolemia, unspecified: Secondary | ICD-10-CM | POA: Insufficient documentation

## 2013-10-27 DIAGNOSIS — I10 Essential (primary) hypertension: Secondary | ICD-10-CM | POA: Insufficient documentation

## 2013-10-27 DIAGNOSIS — I2510 Atherosclerotic heart disease of native coronary artery without angina pectoris: Secondary | ICD-10-CM | POA: Insufficient documentation

## 2013-10-27 DIAGNOSIS — I43 Cardiomyopathy in diseases classified elsewhere: Secondary | ICD-10-CM | POA: Insufficient documentation

## 2013-10-27 DIAGNOSIS — I219 Acute myocardial infarction, unspecified: Secondary | ICD-10-CM | POA: Insufficient documentation

## 2013-10-27 DIAGNOSIS — F419 Anxiety disorder, unspecified: Secondary | ICD-10-CM | POA: Insufficient documentation

## 2013-10-30 ENCOUNTER — Ambulatory Visit: Payer: BC Managed Care – PPO | Admitting: Cardiology

## 2013-10-31 ENCOUNTER — Other Ambulatory Visit: Payer: BC Managed Care – PPO

## 2014-04-17 ENCOUNTER — Encounter: Payer: Self-pay | Admitting: Gastroenterology

## 2014-06-24 ENCOUNTER — Ambulatory Visit: Payer: BC Managed Care – PPO | Admitting: Gastroenterology

## 2014-08-20 ENCOUNTER — Other Ambulatory Visit: Payer: Self-pay

## 2014-08-20 ENCOUNTER — Encounter: Payer: Self-pay | Admitting: Gastroenterology

## 2014-08-20 ENCOUNTER — Ambulatory Visit (INDEPENDENT_AMBULATORY_CARE_PROVIDER_SITE_OTHER): Payer: 59 | Admitting: Gastroenterology

## 2014-08-20 VITALS — BP 112/70 | HR 74 | Ht 70.25 in | Wt 165.0 lb

## 2014-08-20 DIAGNOSIS — R1314 Dysphagia, pharyngoesophageal phase: Secondary | ICD-10-CM

## 2014-08-20 DIAGNOSIS — Z86010 Personal history of colonic polyps: Secondary | ICD-10-CM

## 2014-08-20 DIAGNOSIS — K219 Gastro-esophageal reflux disease without esophagitis: Secondary | ICD-10-CM

## 2014-08-20 MED ORDER — MOVIPREP 100 G PO SOLR: 1.0000 | Freq: Once | ORAL | Status: DC

## 2014-08-20 MED ORDER — LANSOPRAZOLE 30 MG PO CPDR: 30.0000 mg | DELAYED_RELEASE_CAPSULE | Freq: Every day | ORAL | Status: DC

## 2014-08-20 NOTE — Patient Instructions (Signed)
You have been scheduled for an endoscopy and colonoscopy. Please follow the written instructions given to you at your visit today. Please pick up your prep at the pharmacy within the next 1-3 days. If you use inhalers (even only as needed), please bring them with you on the day of your procedure. Your physician has requested that you go to www.startemmi.com and enter the access code given to you at your visit today. This web site gives a general overview about your procedure. However, you should still follow specific instructions given to you by our office regarding your preparation for the procedure. We have sent the following medications to your pharmacy for you to pick up at your convenience: Prevacid We have sent a release to Dr Amedeo Plenty for your previous GI records.

## 2014-08-20 NOTE — Progress Notes (Signed)
    History of Present Illness: This is a 53 year old male with solid food dysphagia for 2-3 years. He has a history of an esophageal stricture dilated in 2002. He takes Prevacid prn. We have a path report showing that he had a colonoscopy by Dr. Amedeo Plenty in 11/2010 showing a sigmoid colon TVA with HGD. He has not returned for follow up colonoscopy. H/O sigmoid colectomy for diverticulitis in 2011. Denies weight loss, abdominal pain, constipation, diarrhea, change in stool caliber, melena, hematochezia, nausea, vomiting,  reflux symptoms, chest pain.  Review of Systems: Pertinent positive and negative review of systems were noted in the above HPI section. All other review of systems were otherwise negative.  Current Medications, Allergies, Past Medical History, Past Surgical History, Family History and Social History were reviewed in [employer] record.  Physical Exam: General: Well developed , well nourished, no acute distress Head: Normocephalic and atraumatic Eyes:  sclerae anicteric, EOMI Ears: Normal auditory acuity Mouth: No deformity or lesions Neck: Supple, no masses or thyromegaly Lungs: Clear throughout to auscultation Heart: Regular rate and rhythm; no murmurs, rubs or bruits Abdomen: Soft, non tender and non distended. No masses, hepatosplenomegaly or hernias noted. Normal Bowel sounds Rectal: deferred to colonoscopy Musculoskeletal: Symmetrical with no gross deformities  Skin: No lesions on visible extremities Pulses:  Normal pulses noted Extremities: No clubbing, cyanosis, edema or deformities noted Neurological: Alert oriented x 4, grossly nonfocal Cervical Nodes:  No significant cervical adenopathy Inguinal Nodes: No significant inguinal adenopathy Psychological:  Alert and cooperative. Normal mood and affect  Assessment and Recommendations:  1. Dysphagia. Likely has a recurrent esophageal stricture. PPI daily long term. The risks, benefits, and  alternatives to endoscopy with possible biopsy and possible dilation were discussed with the patient and they consent to proceed.   2. Personal history of colon TVA with HGD. Request records from Dr. Amedeo Plenty. The risks, benefits, and alternatives to colonoscopy with possible biopsy and possible polypectomy were discussed with the patient and they consent to proceed.   3. H/O sigmoid colectomy for diverticulitis in 2011.

## 2014-08-21 ENCOUNTER — Telehealth: Payer: Self-pay | Admitting: Gastroenterology

## 2014-08-21 DIAGNOSIS — Z86010 Personal history of colonic polyps: Secondary | ICD-10-CM

## 2014-08-21 DIAGNOSIS — Z860101 Personal history of adenomatous and serrated colon polyps: Secondary | ICD-10-CM

## 2014-08-21 DIAGNOSIS — K219 Gastro-esophageal reflux disease without esophagitis: Secondary | ICD-10-CM

## 2014-08-21 DIAGNOSIS — R1314 Dysphagia, pharyngoesophageal phase: Secondary | ICD-10-CM

## 2014-08-21 MED ORDER — MOVIPREP 100 G PO SOLR: 1.0000 | Freq: Once | ORAL | Status: DC

## 2014-08-21 NOTE — Telephone Encounter (Signed)
I actually think rx for Moviprep was to be sent to patient's local pharmacy. Rx at [facility] d/c'ed and new rx sent to local [facility].

## 2014-09-06 ENCOUNTER — Emergency Department (HOSPITAL_COMMUNITY): Payer: Worker's Compensation

## 2014-09-06 ENCOUNTER — Encounter (HOSPITAL_COMMUNITY): Payer: Self-pay | Admitting: *Deleted

## 2014-09-06 ENCOUNTER — Emergency Department (HOSPITAL_COMMUNITY)
Admission: EM | Admit: 2014-09-06 | Discharge: 2014-09-06 | Disposition: A | Discharge status: Home / Self Care | Payer: Worker's Compensation | Attending: Emergency Medicine | Admitting: Emergency Medicine

## 2014-09-06 DIAGNOSIS — E780 Pure hypercholesterolemia: Secondary | ICD-10-CM | POA: Diagnosis not present

## 2014-09-06 DIAGNOSIS — IMO0002 Reserved for concepts with insufficient information to code with codable children: Secondary | ICD-10-CM

## 2014-09-06 DIAGNOSIS — Z9889 Other specified postprocedural states: Secondary | ICD-10-CM | POA: Diagnosis not present

## 2014-09-06 DIAGNOSIS — Z720 Tobacco use: Secondary | ICD-10-CM | POA: Diagnosis not present

## 2014-09-06 DIAGNOSIS — S81012A Laceration without foreign body, left knee, initial encounter: Secondary | ICD-10-CM | POA: Insufficient documentation

## 2014-09-06 DIAGNOSIS — W458XXA Other foreign body or object entering through skin, initial encounter: Secondary | ICD-10-CM | POA: Insufficient documentation

## 2014-09-06 DIAGNOSIS — Z86010 Personal history of colonic polyps: Secondary | ICD-10-CM | POA: Insufficient documentation

## 2014-09-06 DIAGNOSIS — Z8659 Personal history of other mental and behavioral disorders: Secondary | ICD-10-CM | POA: Insufficient documentation

## 2014-09-06 DIAGNOSIS — Z23 Encounter for immunization: Secondary | ICD-10-CM | POA: Insufficient documentation

## 2014-09-06 DIAGNOSIS — I2510 Atherosclerotic heart disease of native coronary artery without angina pectoris: Secondary | ICD-10-CM | POA: Diagnosis not present

## 2014-09-06 DIAGNOSIS — Y9289 Other specified places as the place of occurrence of the external cause: Secondary | ICD-10-CM | POA: Insufficient documentation

## 2014-09-06 DIAGNOSIS — I252 Old myocardial infarction: Secondary | ICD-10-CM | POA: Diagnosis not present

## 2014-09-06 DIAGNOSIS — Z79899 Other long term (current) drug therapy: Secondary | ICD-10-CM | POA: Diagnosis not present

## 2014-09-06 DIAGNOSIS — Z955 Presence of coronary angioplasty implant and graft: Secondary | ICD-10-CM | POA: Insufficient documentation

## 2014-09-06 DIAGNOSIS — K219 Gastro-esophageal reflux disease without esophagitis: Secondary | ICD-10-CM | POA: Insufficient documentation

## 2014-09-06 DIAGNOSIS — Z7982 Long term (current) use of aspirin: Secondary | ICD-10-CM | POA: Insufficient documentation

## 2014-09-06 DIAGNOSIS — I10 Essential (primary) hypertension: Secondary | ICD-10-CM | POA: Insufficient documentation

## 2014-09-06 DIAGNOSIS — Y9389 Activity, other specified: Secondary | ICD-10-CM | POA: Diagnosis not present

## 2014-09-06 DIAGNOSIS — Y998 Other external cause status: Secondary | ICD-10-CM | POA: Diagnosis not present

## 2014-09-06 MED ORDER — TETANUS-DIPHTH-ACELL PERTUSSIS 5-2.5-18.5 LF-MCG/0.5 IM SUSP
0.5000 mL | Freq: Once | INTRAMUSCULAR | Status: AC
  Administered 2014-09-06: 0.5 mL via INTRAMUSCULAR
  Filled 2014-09-06: qty 0.5

## 2014-09-06 MED ORDER — LIDOCAINE-EPINEPHRINE (PF) 2 %-1:200000 IJ SOLN
10.0000 mL | Freq: Once | INTRAMUSCULAR | Status: AC
  Administered 2014-09-06: 10 mL
  Filled 2014-09-06: qty 20

## 2014-09-06 NOTE — ED Notes (Signed)
pbt at bedside performing workmans comp procedures

## 2014-09-06 NOTE — ED Provider Notes (Signed)
CSN: [id number]     Arrival date & time 09/06/14  0004 History   First MD Initiated Contact with Patient 09/06/14 0009     Chief Complaint  Patient presents with  . Laceration     (Consider location/radiation/quality/duration/timing/severity/associated sxs/prior Treatment) HPI Comments: The patient is a 53 year old male presents emergency room chief complaint of laceration to left knee. Patient reports approximately 1.5 hours ago cutting his knee with a sheet of metal. He denies other injury. He reports he is able to ambulate without assistance. Unknown tetanus status.  The history is provided by the patient. No language interpreter was used.    Past Medical History  Diagnosis Date  . Coronary artery disease   . Hypertension   . Smoker   . Wears glasses   . History of ETOH abuse   . Stented coronary artery   . Myocardial infarction 2009  . GERD (gastroesophageal reflux disease)   . Pure hypercholesterolemia   . Cardiomyopathy in other diseases classified elsewhere   . Anxiety   . Esophageal stricture   . Diverticulosis   . Colon polyp    Past Surgical History  Procedure Laterality Date  . Cardiac catheterization  2009    placed 2 stents  . Partial colectomy  2011    sigmoid  . Tonsillectomy    . Wisdom tooth extraction    . Mouth surgery    . Appendectomy  2011    during colectomy  . Inguinal hernia repair Left 09/16/2013    Procedure: HERNIA REPAIR INGUINAL ADULT;  Surgeon: [name], MD;  Location: [facility];  Service: General;  Laterality: Left;  . Hernia repair     Family History  Problem Relation Age of Onset  . Lung cancer Mother   . Heart disease Father     has pacemaker  . Breast cancer Sister   . Colon cancer Neg Hx   . Colon polyps Neg Hx   . Esophageal cancer Neg Hx   . Kidney disease Neg Hx   . Diabetes Neg Hx    History  Substance Use Topics  . Smoking status: Current Every Day Smoker -- 1.00 packs/day  . Smokeless  tobacco: Never Used     Comment: Pt given handout to quit smoking  . Alcohol Use: No     Comment: stopped 4/14    Review of Systems  SEE HPI  Allergies  Morphine and related  Home Medications   Prior to Admission medications   Medication Sig Start Date End Date Taking? Authorizing Provider  aspirin 81 MG tablet Take 81 mg by mouth as needed.     Historical Provider, MD  lansoprazole (PREVACID) 30 MG capsule Take 1 capsule (30 mg total) by mouth daily at 12 noon. 08/20/14   Ladene Artist, MD  lisinopril (PRINIVIL,ZESTRIL) 5 MG tablet Take 5 mg by mouth daily.    Historical Provider, MD  MOVIPREP 100 G SOLR Take 1 kit (200 g total) by mouth once. 08/21/14   Ladene Artist, MD  simvastatin (ZOCOR) 20 MG tablet Take 20 mg by mouth every evening.    Historical Provider, MD   BP 111/81 mmHg  Pulse 80  Temp(Src) 98.3 F (36.8 C)  Resp 18  Ht $R'5\' 11"'BI$  (1.803 m)  Wt 163 lb (73.936 kg)  BMI 22.74 kg/m2  SpO2 100% Physical Exam  Constitutional: He is oriented to person, place, and time. He appears well-developed and well-nourished. No distress.  HENT:  Head:  Normocephalic and atraumatic.  Neck: Neck supple.  Pulmonary/Chest: Effort normal. No respiratory distress.  Musculoskeletal:  For severe laceration to medial knee, no obvious joint capsule involvement. No obvious foreign body. Minimal bleeding. Mild crepitus with ballottement of left patella. Full active ROM.  Neurological: He is oriented to person, place, and time.  Skin: Skin is warm and dry.  Psychiatric: He has a normal mood and affect. His behavior is normal.  Nursing note and vitals reviewed.   ED Course  LACERATION REPAIR Date/Time: 09/06/2014 1:57 AM Performed by: Harvie Heck Authorized by: Harvie Heck Consent: Verbal consent obtained. Risks and benefits: risks, benefits and alternatives were discussed Consent given by: patient Patient understanding: patient states understanding of the procedure being  performed Patient consent: the patient's understanding of the procedure matches consent given Procedure consent: procedure consent matches procedure scheduled Required items: required blood products, implants, devices, and special equipment available Patient identity confirmed: verbally with patient Time out: Immediately prior to procedure a "time out" was called to verify the correct patient, procedure, equipment, support staff and site/side marked as required. Body area: lower extremity Location details: left knee Laceration length: 4 cm Foreign bodies: no foreign bodies Tendon involvement: none Nerve involvement: none Vascular damage: no Anesthesia: local infiltration Local anesthetic: lidocaine 2% with epinephrine Anesthetic total: 6 ml Patient sedated: no Preparation: Patient was prepped and draped in the usual sterile fashion. Irrigation solution: saline Irrigation method: syringe Amount of cleaning: standard Debridement: none Degree of undermining: none Skin closure: 4-0 Prolene Number of sutures: 4 Technique: simple Approximation: close Approximation difficulty: simple Dressing: 4x4 sterile gauze Patient tolerance: Patient tolerated the procedure well with no immediate complications   (including critical care time) Labs Review Labs Reviewed - No data to display  Imaging Review Dg Knee Complete 4 Views Left  09/06/2014   CLINICAL DATA:  Open laceration on the left knee, status post acute injury from sheet metal. Initial encounter.  EXAM: LEFT KNEE - COMPLETE 4+ VIEW  COMPARISON:  None.  FINDINGS: There is no evidence of fracture or dislocation. The joint spaces are preserved. No significant degenerative change is seen; the patellofemoral joint is grossly unremarkable in appearance.  No significant joint effusion is seen. A prominent soft tissue laceration at the distal thigh is difficult to fully characterize. No radiopaque foreign bodies are seen. There is associated soft  tissue swelling.  IMPRESSION: No evidence of fracture or dislocation. No radiopaque foreign bodies seen.   Electronically Signed   By: Garald Balding M.D.   On: 09/06/2014 01:47     EKG Interpretation None      MDM   Final diagnoses:  Laceration   Patient with laceration to left knee, negative x-ray joint involvement or retained foreign bodies. Tetanus updated. Laceration repaired. Performed without complaints. Plan to have suture removal in 8-12 days.     Harvie Heck, PA-C 09/06/14 1940  Debby Freiberg, MD 09/07/14 [phone]

## 2014-09-06 NOTE — Discharge Instructions (Signed)
Follow up with your doctor, an urgent care, or this Emergency Department for removal of your stitches in 7 days. Do not submerge the stitches in water for the first 24 hours. Take over-the-counter pain medications for discomfort.  TREATMENT   Keep the wound clean and dry.   If you were given a bandage (dressing), you should change it at least once a day. Also, change the dressing if it becomes wet or dirty, or as directed by your caregiver.   Wash the wound with soap and water 2 times a day. Rinse the wound off with water to remove all soap. Pat the wound dry with a clean towel.   You may shower as usual after the first 24 hours. Do not soak the wound in water until the sutures are removed.   Once the wound has healed, scarring can be minimized by covering the wound with sunscreen during the day for 1 full year.[name]   SEEK MEDICAL CARE IF:   You have redness, swelling, or increasing pain in the wound.   You see a red line that goes away from the wound.   You have yellowish-white fluid (pus) coming from the wound.   You have a fever.   You notice a bad smell coming from the wound or dressing.   Your wound breaks open before or after sutures have been removed.   You notice something coming out of the wound such as wood or glass.   Your wound is on your hand or foot and you cannot move a finger or toe.   Your pain is not controlled with prescribed medicine.   If you did not receive a tetanus shot today because you thought you were up to date, but did not recall when your last one was given, make sure to check with your primary caregiver to determine if you need one.

## 2014-09-06 NOTE — ED Notes (Signed)
The pt has a laceration to the lt knee .  A piece of steel   Cut his lt knee .  Bleeding controlled

## 2014-09-06 NOTE — ED Notes (Signed)
Patient transported to X-ray 

## 2014-09-09 ENCOUNTER — Encounter: Payer: Self-pay | Admitting: Gastroenterology

## 2014-09-29 ENCOUNTER — Encounter: Payer: Self-pay | Admitting: Gastroenterology

## 2014-09-29 ENCOUNTER — Other Ambulatory Visit: Payer: Self-pay | Admitting: *Deleted

## 2014-09-29 ENCOUNTER — Encounter: Payer: Self-pay | Admitting: *Deleted

## 2014-09-29 ENCOUNTER — Ambulatory Visit (AMBULATORY_SURGERY_CENTER): Payer: 59 | Admitting: Gastroenterology

## 2014-09-29 VITALS — BP 110/72 | HR 69 | Temp 97.6°F | Resp 26 | Ht 70.0 in | Wt 165.0 lb

## 2014-09-29 DIAGNOSIS — R634 Abnormal weight loss: Secondary | ICD-10-CM

## 2014-09-29 DIAGNOSIS — Z86010 Personal history of colonic polyps: Secondary | ICD-10-CM

## 2014-09-29 DIAGNOSIS — K222 Esophageal obstruction: Secondary | ICD-10-CM

## 2014-09-29 DIAGNOSIS — D120 Benign neoplasm of cecum: Secondary | ICD-10-CM

## 2014-09-29 DIAGNOSIS — D124 Benign neoplasm of descending colon: Secondary | ICD-10-CM

## 2014-09-29 DIAGNOSIS — R1314 Dysphagia, pharyngoesophageal phase: Secondary | ICD-10-CM

## 2014-09-29 MED ORDER — SODIUM CHLORIDE 0.9 % IV SOLN: 500.0000 mL | INTRAVENOUS | Status: DC

## 2014-09-29 NOTE — Progress Notes (Signed)
Called to room to assist during endoscopic procedure.  Patient ID and intended procedure confirmed with present staff. Received instructions for my participation in the procedure from the performing physician.  

## 2014-09-29 NOTE — Op Note (Signed)
[place]  [address]. [place], [id number]   ENDOSCOPY PROCEDURE REPORT  PATIENT: Jeremiah Short, Jeremiah Short  MR#: [id number] BIRTHDATE: 23-Mar-1961 , 53  yrs. old GENDER: male ENDOSCOPIST: Ladene Artist, MD, [facility] PROCEDURE DATE:  09/29/2014 PROCEDURE:  EGD w/ wire guided (savary) dilation ASA CLASS:     Class II INDICATIONS:  dysphagia and weight loss. MEDICATIONS: Monitored anesthesia care, Residual sedation present, and Propofol 180 mg IV TOPICAL ANESTHETIC: none DESCRIPTION OF PROCEDURE: After the risks benefits and alternatives of the procedure were thoroughly explained, informed consent was obtained.  The LB [id number] endoscope was introduced through the mouth and advanced to the second portion of the duodenum , Without limitations.  The instrument was slowly withdrawn as the mucosa was fully examined.  ESOPHAGUS: There was a short benign appearing stricture, with an inner diameter of 26mm, at the gastroesophageal junction.  The stricture was easily traversable.  The stricture was dilated using a 91mm (42Fr) savary dilator over guidewire.  The stricture was dilated using a 79mm (45Fr) savary dilator over guidewire.  The stricture was dilated using a 21mm (48Fr) savary dilator over guidewire.   The esophagus was otherwise normal. STOMACH: Gastritis, mild, was found in the gastric antrum and gastric body.   The stomach otherwise appeared normal. DUODENUM: The duodenal mucosa showed no abnormalities in the bulb and 2nd part of the duodenum.  Retroflexed views revealed no abnormalities.   The scope was then withdrawn from the patient and the procedure completed.  COMPLICATIONS: There were no immediate complications.  ENDOSCOPIC IMPRESSION: 1.   Stricture at the gastroesophageal junction; dilated using savary dilators over guidewire 2.   Mild gastritis in the gastric antrum and gastric body 3.   The EGD otherwise appeared normal  RECOMMENDATIONS: 1.   Anti-reflux regimen long term 2.  Continue PPI daily long term-Prevacid 30 mg po qam 3.  Post dilation instructions 4.  CT scan of abdomen/pelvis to further evaluate weight loss 5.  Follow-up appointment with me in office in 1 month  eSigned:  Ladene Artist, MD, [facility] 09/29/2014 3:27 PM

## 2014-09-29 NOTE — Op Note (Signed)
[place]  [address]. [place], [id number]   COLONOSCOPY PROCEDURE REPORT PATIENT: Jeremiah Short, Jeremiah Short  MR#: [id number] BIRTHDATE: August 13, 1961 , 53  yrs. old GENDER: male ENDOSCOPIST: Ladene Artist, MD, [facility] PROCEDURE DATE:  09/29/2014 PROCEDURE:   Colonoscopy with snare polypectomy First Screening Colonoscopy - Avg.  risk and is 50 yrs.  old or older - No.  Prior Negative Screening - Now for repeat screening. N/A  History of Adenoma - Now for follow-up colonoscopy & has been > or = to 3 yrs.  Yes hx of adenoma.  Has been 3 or more years since last colonoscopy.  Polyps Removed Today? Yes. ASA CLASS:   Class II INDICATIONS:surveillance colonoscopy based on a history of adenomatous colonic polyp(s)-TVA with HCD in 2012, and weight loss.  MEDICATIONS: Monitored anesthesia care and Propofol 200 mg IV DESCRIPTION OF PROCEDURE:   After the risks benefits and alternatives of the procedure were thoroughly explained, informed consent was obtained.  The digital rectal exam revealed no abnormalities of the rectum.   The LB [id number]  endoscope was introduced through the anus and advanced to the cecum, which was identified by both the appendix and ileocecal valve. No adverse events experienced.   The quality of the prep was good, using MoviPrep  The instrument was then slowly withdrawn as the colon was fully examined.  COLON FINDINGS: A sessile polyp measuring 5 mm in size was found at the appendiceal orifice.  A polypectomy was performed with a cold snare.  The resection was complete, the polyp tissue was completely retrieved and sent to histology.   Two semi-pedunculated polyps measuring 8 mm in size were found in the descending colon. Polypectomies were performed with a cold snare.  The resection was complete, the polyp tissue was completely retrieved and sent to histology.  There was moderate diverticulosis noted throughout the entire examined colon. There was  evidence of a prior end-to-end colo-colonic surgical anastomosis in the left colon.  The examination was otherwise normal.  Retroflexed views revealed no abnormalities. The time to cecum=1 minutes 36 seconds.  Withdrawal time=9 minutes 49 seconds.  The scope was withdrawn and the procedure completed. COMPLICATIONS: There were no immediate complications.  ENDOSCOPIC IMPRESSION: 1.   Sessile polyp at the appendiceal orifice; polypectomy performed with a cold snare 2.   Two semi-pedunculated polyps in the descending colon; polypectomies performed with a cold snare 3.   Moderate diverticulosis throughout the entire examined colon 4.   Prior colo-colonic surgical anastomosis in the left colon 5.   The examination was otherwise normal  RECOMMENDATIONS: 1.  Hold Aspirin and all other NSAIDS for 2 weeks. 2.  Await pathology results 3.  High fiber diet with liberal fluid intake. 4.  Repeat Colonoscopy in 3 years.  eSigned:  Ladene Artist, MD, [facility] 09/29/2014 3:18 PM

## 2014-09-29 NOTE — Progress Notes (Signed)
Pt given oral contrast and CT scan paper work to fill our date and times when Dr. Lynne Leader office nurse calls her.  Creatinine isn't needing to be drawn- pt 53, not diabetic, doesn't have a hx of myeloma, or kidney problems.    Pt did c/o some abdominal cramping after turning onto his back.  He then ambulated to the BR but states he didn't pass any air.  He states he does feel better after moving around.  I told him that he could stay at the [facility] until he felt better or could go home- ambulate, drink warm fluids to try to pass air.  He has passed large amt of air while in the RR, abdomen soft and easily palpable.  He states he wants to go home.  On call MD number on pts discharge instructions

## 2014-09-29 NOTE — Progress Notes (Signed)
Procedure ends, to recovery, report given and VSS. 

## 2014-09-29 NOTE — Progress Notes (Signed)
Patient recently seen in ED related to laceration on left knee. Patient showing incision  On knee, suture still in place.

## 2014-09-29 NOTE — Patient Instructions (Signed)
YOU HAD AN ENDOSCOPIC PROCEDURE TODAY AT [facility]: Refer to the procedure report that was given to you for any specific questions about what was found during the examination.  If the procedure report does not answer your questions, please call your gastroenterologist to clarify.  If you requested that your care partner not be given the details of your procedure findings, then the procedure report has been included in a sealed envelope for you to review at your convenience later.  YOU SHOULD EXPECT: Some feelings of bloating in the abdomen. Passage of more gas than usual.  Walking can help get rid of the air that was put into your GI tract during the procedure and reduce the bloating. If you had a lower endoscopy (such as a colonoscopy or flexible sigmoidoscopy) you may notice spotting of blood in your stool or on the toilet paper. If you underwent a bowel prep for your procedure, then you may not have a normal bowel movement for a few days.  DIET: FOLLOW DILATION DIET- SEE HANDOUT  Drink plenty of fluids but you should avoid alcoholic beverages for 24 hours.  ACTIVITY: Your care partner should take you home directly after the procedure.  You should plan to take it easy, moving slowly for the rest of the day.  You can resume normal activity the day after the procedure however you should NOT DRIVE or use heavy machinery for 24 hours (because of the sedation medicines used during the test).    SYMPTOMS TO REPORT IMMEDIATELY: A gastroenterologist can be reached at any hour.  During normal business hours, 8:30 AM to 5:00 PM Monday through Friday, call [phone].  After hours and on weekends, please call the GI answering service at [phone] who will take a message and have the physician on call contact you.   Following lower endoscopy (colonoscopy or flexible sigmoidoscopy):  Excessive amounts of blood in the stool  Significant tenderness or worsening of abdominal  pains  Swelling of the abdomen that is new, acute  Fever of 100F or higher  Following upper endoscopy (EGD)  Vomiting of blood or coffee ground material  New chest pain or pain under the shoulder blades  Painful or persistently difficult swallowing  New shortness of breath  Fever of 100F or higher  Black, tarry-looking stools  FOLLOW UP: If any biopsies were taken you will be contacted by phone or by letter within the next 1-3 weeks.  Call your gastroenterologist if you have not heard about the biopsies in 3 weeks.  Our staff will call the home number listed on your records the next business day following your procedure to check on you and address any questions or concerns that you may have at that time regarding the information given to you following your procedure. This is a courtesy call and so if there is no answer at the home number and we have not heard from you through the emergency physician on call, we will assume that you have returned to your regular daily activities without incident.  SIGNATURES/CONFIDENTIALITY: You and/or your care partner have signed paperwork which will be entered into your electronic medical record.  These signatures attest to the fact that that the information above on your After Visit Summary has been reviewed and is understood.  Full responsibility of the confidentiality of this discharge information lies with you and/or your care-partner.  NO ASPIRIN, ASPIRIN CONTAINING PRODUCTS (BC OR GOODY POWDERS), OR NSAIDS (IBUPROFEN, MOTRIN, ADVIL, ALEVE) FOR 2  WEEKS- TYLENOL IS OK  Please read over handouts about polyps, diverticulosis, high fiber diets, stricture, gastritis, and anti-reflux regimen  Please call office in the next few days to set up follow up office appointment for 1 month  CT scan- office nurse will set this up and call you with appointment date and time

## 2014-09-30 ENCOUNTER — Telehealth: Payer: Self-pay | Admitting: *Deleted

## 2014-09-30 NOTE — Telephone Encounter (Signed)
Spoke with patient, aware of CT scan scheduled and Instructions Already has contrast

## 2014-09-30 NOTE — Telephone Encounter (Signed)
  Follow up Call-  Call back number 09/29/2014  Post procedure Call Back phone  # [phone]  Permission to leave phone message Yes     Patient questions:  Do you have a fever, pain , or abdominal swelling? No. Pain Score  0 *  Have you tolerated food without any problems? Yes.    Have you been able to return to your normal activities? Yes.    Do you have any questions about your discharge instructions: Diet   No. Medications  No. Follow up visit  No.  Do you have questions or concerns about your Care? No.  Actions: * If pain score is 4 or above: No action needed, pain <4. Patient called on call MD # last pm. Dr. Olevia Perches calling patient prior to this call. Patient complained that he had a lot of gas last pm, he felt like he had gas in upper abdomen. No discomfort this am. No fever, bleeding or swelling this am. Patient encouraged to drink warm fluids, walk about his home and gasx if needed. Patient instructed to call with chest pain with back radiation, vomiting of blood or rectal bleeding, abdominal swelling or fever. Patient agreed.

## 2014-10-07 ENCOUNTER — Encounter (HOSPITAL_COMMUNITY): Payer: Self-pay

## 2014-10-07 ENCOUNTER — Encounter: Payer: Self-pay | Admitting: Gastroenterology

## 2014-10-07 ENCOUNTER — Ambulatory Visit (HOSPITAL_COMMUNITY)
Admission: RE | Admit: 2014-10-07 | Discharge: 2014-10-07 | Disposition: A | Discharge status: Home / Self Care | Payer: 59 | Source: Ambulatory Visit | Attending: Gastroenterology | Admitting: Gastroenterology

## 2014-10-07 DIAGNOSIS — R634 Abnormal weight loss: Secondary | ICD-10-CM | POA: Insufficient documentation

## 2014-10-07 DIAGNOSIS — K8020 Calculus of gallbladder without cholecystitis without obstruction: Secondary | ICD-10-CM | POA: Insufficient documentation

## 2014-10-07 DIAGNOSIS — K5790 Diverticulosis of intestine, part unspecified, without perforation or abscess without bleeding: Secondary | ICD-10-CM | POA: Diagnosis not present

## 2014-10-07 MED ORDER — IOHEXOL 300 MG/ML  SOLN
100.0000 mL | Freq: Once | INTRAMUSCULAR | Status: AC | PRN
  Administered 2014-10-07: 100 mL via INTRAVENOUS

## 2014-10-12 ENCOUNTER — Ambulatory Visit: Payer: Self-pay | Admitting: Gastroenterology

## 2014-11-02 ENCOUNTER — Ambulatory Visit: Payer: Self-pay | Admitting: Gastroenterology

## 2014-12-02 ENCOUNTER — Ambulatory Visit (INDEPENDENT_AMBULATORY_CARE_PROVIDER_SITE_OTHER): Payer: 59 | Admitting: Gastroenterology

## 2014-12-02 ENCOUNTER — Encounter: Payer: Self-pay | Admitting: Gastroenterology

## 2014-12-02 VITALS — BP 110/75 | HR 76 | Ht 70.5 in | Wt 157.0 lb

## 2014-12-02 DIAGNOSIS — R634 Abnormal weight loss: Secondary | ICD-10-CM

## 2014-12-02 DIAGNOSIS — K222 Esophageal obstruction: Secondary | ICD-10-CM

## 2014-12-02 DIAGNOSIS — K219 Gastro-esophageal reflux disease without esophagitis: Secondary | ICD-10-CM

## 2014-12-02 NOTE — Patient Instructions (Signed)
You need to establish with a Primary Care physician to get a complete physical.  Thank you for choosing me and [facility].  Pricilla Riffle. Dagoberto Ligas., MD., [facility] Regal

## 2014-12-02 NOTE — Progress Notes (Signed)
History of Present Illness: This is a 54 year old male returning for follow-up of GERD and esophageal stricture. He underwent EGD with dilation in 2 months ago. His dysphagia has substantially improved. He underwent CT scan of the abdomen/pelvis for weight loss with below findings. He states his weight is remaining stable. He has not had a complete physical exam or regular PCP follow-up.   IMPRESSION: 1. No acute findings in the abdomen or pelvis. No explanation weight loss. 2. Thickening of distal esophagus could represent esophagitis versus mucosal thickening of nondistention. 3. Periampullary duodenum diverticulum. No pancreatic inflammation. 4. Mild left colon diverticulosis without evidence of diverticulitis. 5. Cholelithiasis without cholecystitis.   Allergies  Allergen Reactions  . Morphine And Related     dont remember the symptoms   Outpatient Prescriptions Prior to Visit  Medication Sig Dispense Refill  . aspirin 81 MG tablet Take 81 mg by mouth as needed.     . lansoprazole (PREVACID) 30 MG capsule Take 1 capsule (30 mg total) by mouth daily at 12 noon. 90 capsule 3  . lisinopril (PRINIVIL,ZESTRIL) 5 MG tablet Take 5 mg by mouth daily.    . Multiple Vitamin (MULTIVITAMIN) tablet Take 1 tablet by mouth daily.    . simvastatin (ZOCOR) 20 MG tablet Take 20 mg by mouth every evening.     No facility-administered medications prior to visit.   Past Medical History  Diagnosis Date  . Coronary artery disease   . Hypertension   . Smoker   . Wears glasses   . History of ETOH abuse   . Stented coronary artery   . Myocardial infarction 2009  . GERD (gastroesophageal reflux disease)   . Pure hypercholesterolemia   . Cardiomyopathy in other diseases classified elsewhere   . Anxiety   . Esophageal stricture   . Diverticulosis   . Tubular adenoma of colon 2012  . Depression    Past Surgical History  Procedure Laterality Date  . Cardiac catheterization  2009    placed  2 stents  . Partial colectomy  2011    sigmoid  . Tonsillectomy    . Wisdom tooth extraction    . Mouth surgery    . Appendectomy  2011    during colectomy  . Inguinal hernia repair Left 09/16/2013    Procedure: HERNIA REPAIR INGUINAL ADULT;  Surgeon: [name], MD;  Location: [facility];  Service: General;  Laterality: Left;  . Hernia repair    . Coronary stent placement     History   Social History  . Marital Status: Divorced    Spouse Name: N/A  . Number of Children: 2  . Years of Education: N/A   Occupational History  . Glass blower/designer    Social History Main Topics  . Smoking status: Current Every Day Smoker -- 1.00 packs/day    Types: E-cigarettes, Cigarettes  . Smokeless tobacco: Never Used     Comment: Pt given handout to quit smoking  . Alcohol Use: No     Comment: stopped 4/14  . Drug Use: No  . Sexual Activity: Not on file   Other Topics Concern  . None   Social History Narrative   Family History  Problem Relation Age of Onset  . Lung cancer Mother   . Heart disease Father     has pacemaker  . Breast cancer Sister   . Colon cancer Neg Hx   . Colon polyps Neg Hx   . Esophageal cancer Neg  Hx   . Kidney disease Neg Hx   . Diabetes Neg Hx     Physical Exam: General: Well developed , well nourished, no acute distress Head: Normocephalic and atraumatic Eyes:  sclerae anicteric, EOMI Ears: Normal auditory acuity Mouth: No deformity or lesions Lungs: Clear throughout to auscultation Heart: Regular rate and rhythm; no murmurs, rubs or bruits Abdomen: Soft, non tender and non distended. No masses, hepatosplenomegaly or hernias noted. Normal Bowel sounds Musculoskeletal: Symmetrical with no gross deformities  Pulses:  Normal pulses noted Extremities: No clubbing, cyanosis, edema or deformities noted Neurological: Alert oriented x 4, grossly nonfocal Psychological:  Alert and cooperative. Normal mood and affect  Assessment and  Recommendations:  1. GERD with a peptic stricture status post dilation. Continue Prevacid 30 mg daily and standard antireflux measures.  2. Weight loss. No gastrointestinal cause identified. I have strongly advised him to establish with a PCP and undergo complete physical examination and have further evaluation of his weight loss.  3. Cholelithiasis. Asymptomatic.  4. Personal history of tubulovillous adenoma with high-grade dysplasia. Recent colonoscopy had small tubular adenomas. Surveillance colonoscopy recommended at a 3 year interval in December 2018.  Over 15 minutes spent with the patient. Greater than 50% of the time was spent counseling and coordinating care.

## 2015-05-31 IMAGING — CT CT ABD-PELV W/ CM
2 of 4 series · 16 of 46 positions shown, 18 images · IV contrast (OMNIPAQUE)
Comparison: CT 10/08/2009

CLINICAL DATA: Weight loss. Prior history of Diverticulitis. 30
pound weight loss since of May 2014.

EXAM:
CT ABDOMEN AND PELVIS WITH CONTRAST
TECHNIQUE: Multidetector CT imaging of the abdomen and pelvis was performed
using the standard protocol following bolus administration of
intravenous contrast.
CONTRAST:  100mL OMNIPAQUE IOHEXOL 300 MG/ML  SOLN

[Series 2: rtn a/p with · axial · 0.74mm/px · z∈[-500,-80]mm · 13 of 94 slices shown, 15 images]
[im 5/94  soft-tissue]
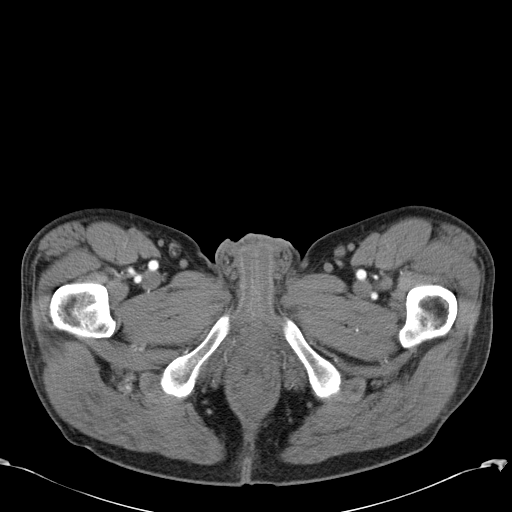
[im 5/94  bone]
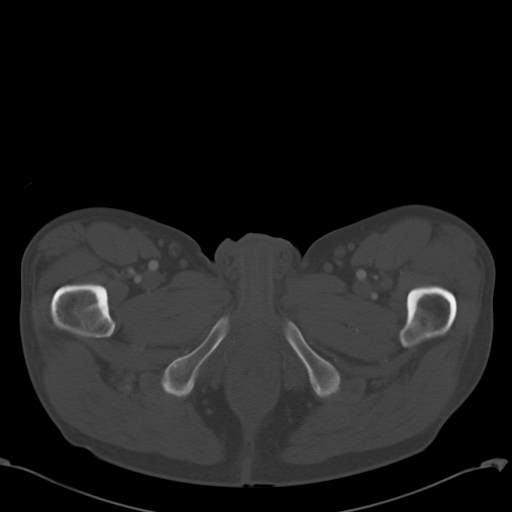
[im 13/94  soft-tissue]
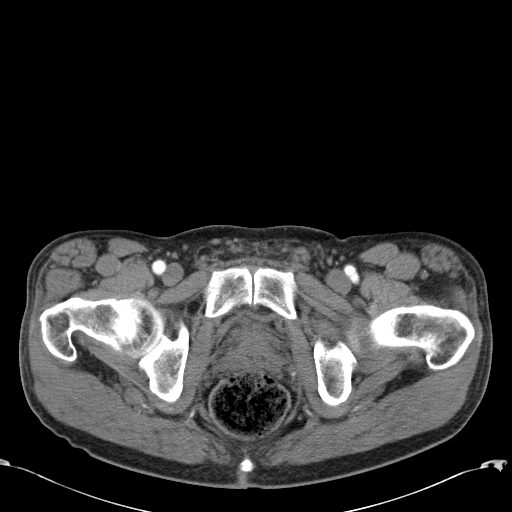
[im 21/94  soft-tissue]
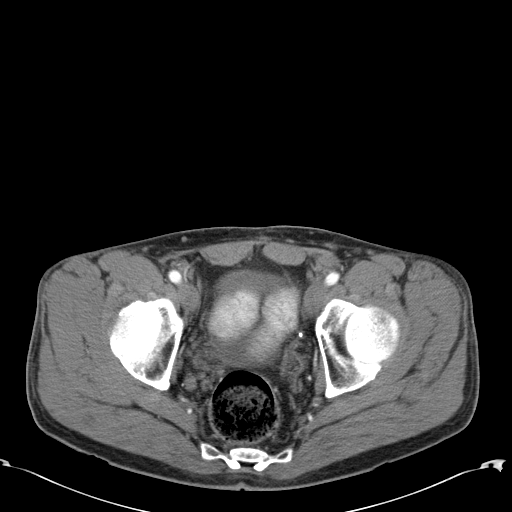
[im 25/94  soft-tissue]
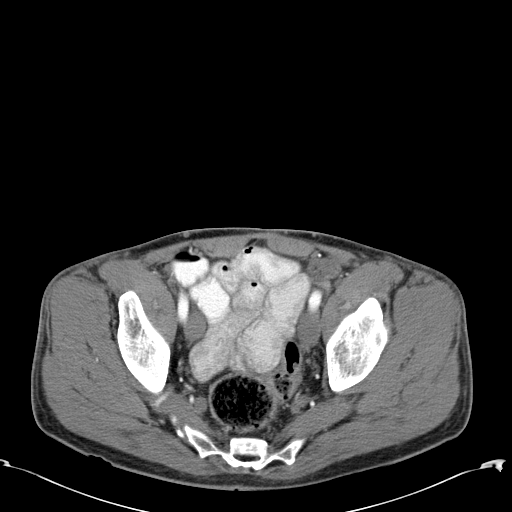
[im 33/94  soft-tissue]
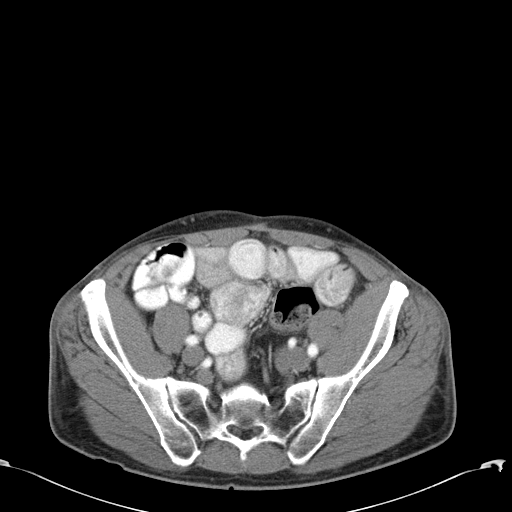
[im 41/94  soft-tissue]
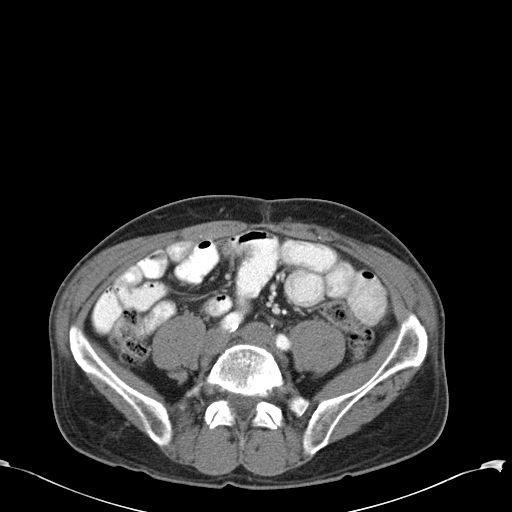
[im 49/94  soft-tissue]
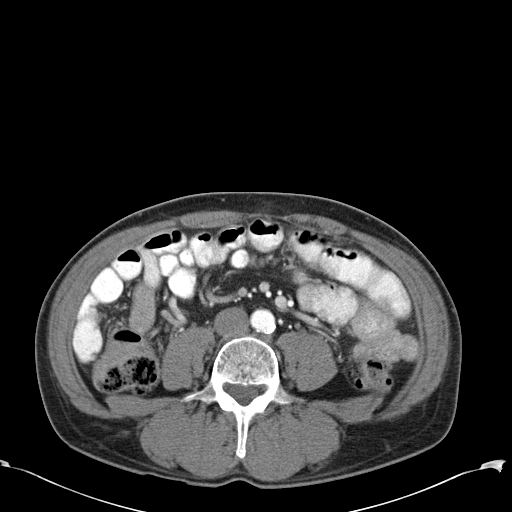
[im 53/94  soft-tissue]
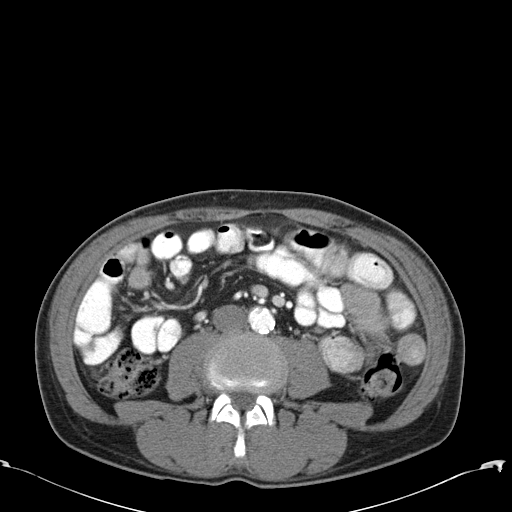
[im 61/94  soft-tissue]
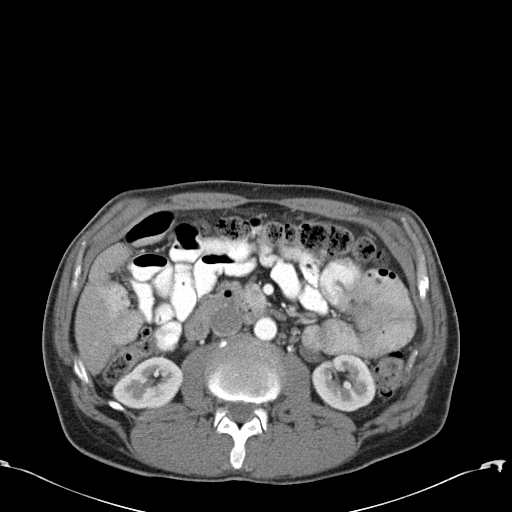
[im 61/94  bone]
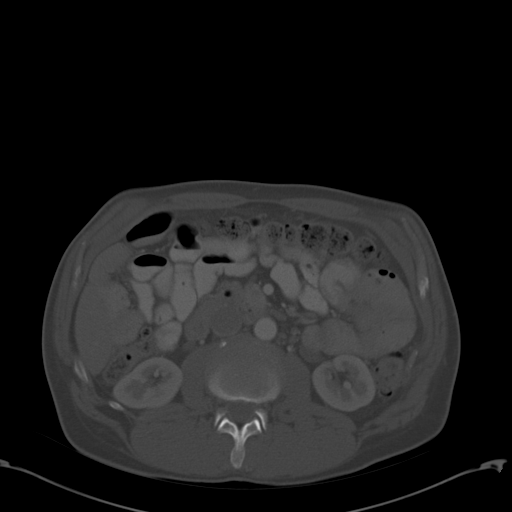
[im 69/94  soft-tissue]
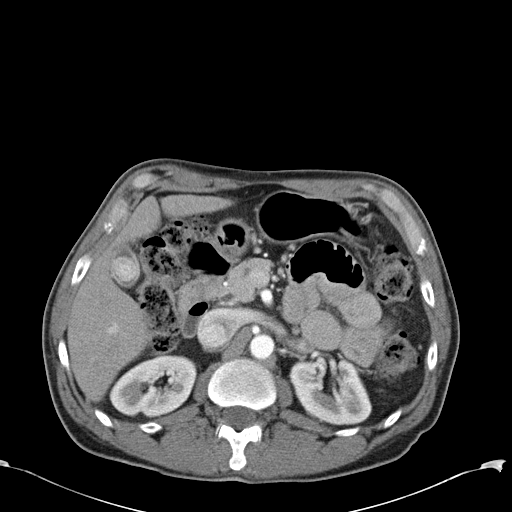
[im 73/94  soft-tissue]
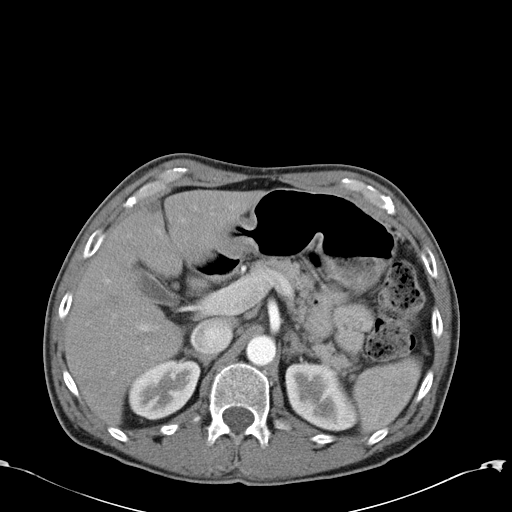
[im 81/94  soft-tissue]
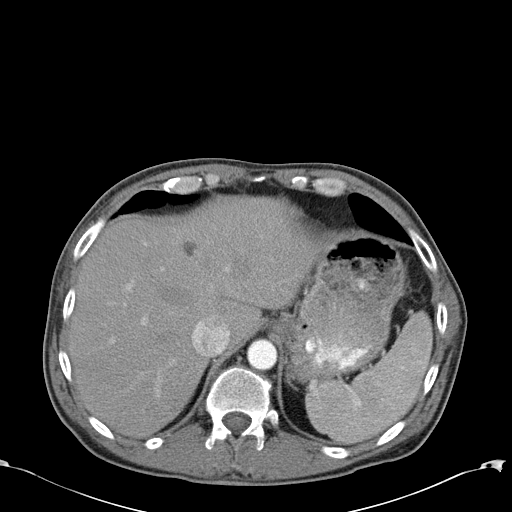
[im 89/94  soft-tissue]
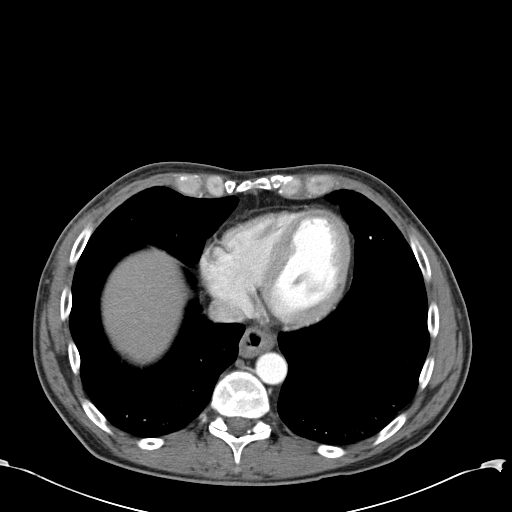

[Series 602: <mpr thick range> · coronal · 0.91mm/px · 3 of 87 slices shown]
[im 29/87  soft-tissue]
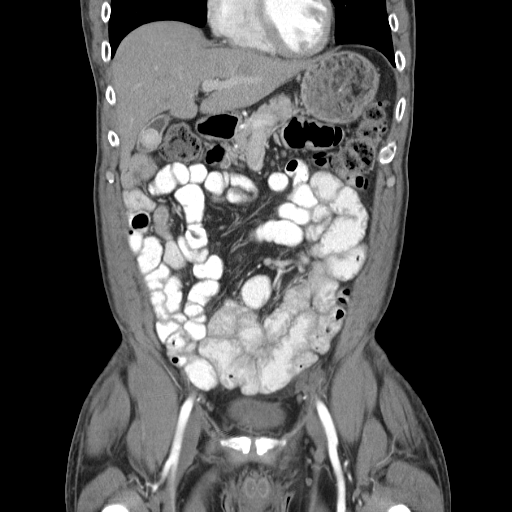
[im 39/87  soft-tissue]
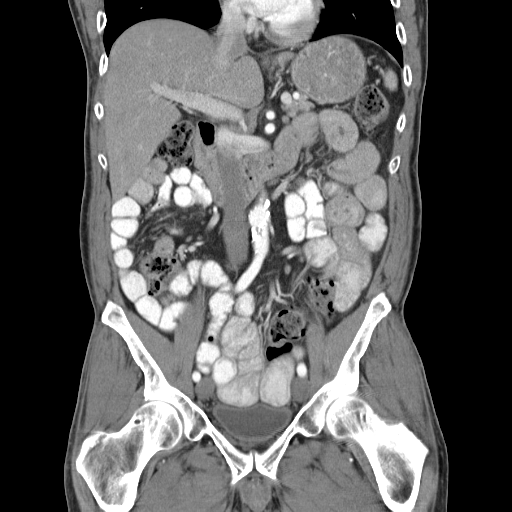
[im 48/87  soft-tissue]
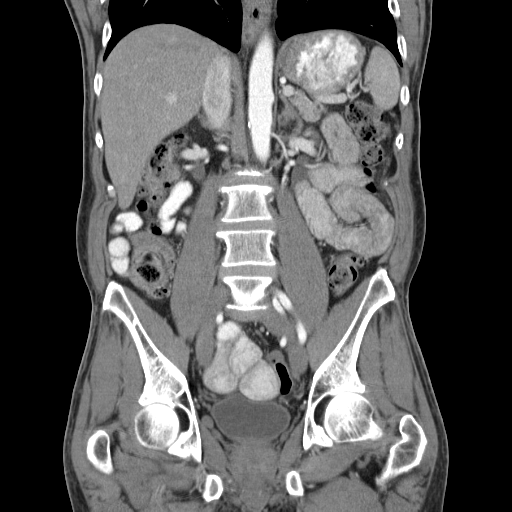

[16 of 46 positions shown; findings below may reference images not displayed]

FINDINGS: Lower chest:  Lung bases are clear.

Hepatobiliary: Low-density lesion in the left hepatic lobe measures
10 mm and has simple fluid attenuation consent benign cyst. This
cyst increased slightly from comparison exam . No biliary duct
dilatation. Gallbladder contains a large 20 mm dense calculus which
is similar to comparison exam no gallbladder inflammation. There is
small focus of fatty infiltration along the falciform ligament

Pancreas: There is a 2 cm periampullary diverticulum. No evidence of
pancreatic inflammation. Pancreatic body and tail are normal. No
pancreatic ductal dilatation.

Spleen: Normal spleen.

Adrenals/urinary tract: Adrenal glands are normal.The kidneys,
ureters, and bladder normal.

Stomach/Bowel: Mild thickening of the distal esophagus which is
circumferential seen on image 8, series 2. Stomach, small bowel,
appendix, cecum are normal. The colon has a moderate volume of
stool. There are diverticula descending colon sigmoid colon without
acute inflammation.

Vascular/Lymphatic: Abdominal aorta is normal caliber. There is no
retroperitoneal or periportal lymphadenopathy. No pelvic
lymphadenopathy.

Reproductive: Prostate gland is normal.

Musculoskeletal: No aggressive osseous lesion.

Other: No free-fluid in or pelvis. No peritoneal mesenteric disease.
IMPRESSION: 1. No acute findings in the abdomen or pelvis. No explanation weight
loss.
2. Thickening of distal esophagus could represent esophagitis versus
mucosal thickening of nondistention.
3. Periampullary duodenum diverticulum.  No pancreatic inflammation.
4. Mild left colon diverticulosis without evidence of
diverticulitis.
5. Cholelithiasis without cholecystitis.

## 2015-09-01 ENCOUNTER — Encounter (HOSPITAL_COMMUNITY): Payer: Self-pay | Admitting: *Deleted

## 2015-09-01 ENCOUNTER — Emergency Department (HOSPITAL_COMMUNITY): Payer: 59

## 2015-09-01 ENCOUNTER — Emergency Department (HOSPITAL_COMMUNITY)
Admission: EM | Admit: 2015-09-01 | Discharge: 2015-09-01 | Disposition: A | Discharge status: Home / Self Care | Payer: 59 | Attending: Emergency Medicine | Admitting: Emergency Medicine

## 2015-09-01 DIAGNOSIS — Z9861 Coronary angioplasty status: Secondary | ICD-10-CM | POA: Insufficient documentation

## 2015-09-01 DIAGNOSIS — Z8659 Personal history of other mental and behavioral disorders: Secondary | ICD-10-CM | POA: Diagnosis not present

## 2015-09-01 DIAGNOSIS — E7800 Pure hypercholesterolemia, unspecified: Secondary | ICD-10-CM | POA: Insufficient documentation

## 2015-09-01 DIAGNOSIS — Z9889 Other specified postprocedural states: Secondary | ICD-10-CM | POA: Insufficient documentation

## 2015-09-01 DIAGNOSIS — R079 Chest pain, unspecified: Secondary | ICD-10-CM | POA: Diagnosis present

## 2015-09-01 DIAGNOSIS — R05 Cough: Secondary | ICD-10-CM | POA: Diagnosis not present

## 2015-09-01 DIAGNOSIS — I252 Old myocardial infarction: Secondary | ICD-10-CM | POA: Insufficient documentation

## 2015-09-01 DIAGNOSIS — K219 Gastro-esophageal reflux disease without esophagitis: Secondary | ICD-10-CM | POA: Diagnosis not present

## 2015-09-01 DIAGNOSIS — F17210 Nicotine dependence, cigarettes, uncomplicated: Secondary | ICD-10-CM | POA: Diagnosis not present

## 2015-09-01 DIAGNOSIS — Z86018 Personal history of other benign neoplasm: Secondary | ICD-10-CM | POA: Insufficient documentation

## 2015-09-01 DIAGNOSIS — Z79899 Other long term (current) drug therapy: Secondary | ICD-10-CM | POA: Diagnosis not present

## 2015-09-01 DIAGNOSIS — I10 Essential (primary) hypertension: Secondary | ICD-10-CM | POA: Insufficient documentation

## 2015-09-01 DIAGNOSIS — I2510 Atherosclerotic heart disease of native coronary artery without angina pectoris: Secondary | ICD-10-CM | POA: Insufficient documentation

## 2015-09-01 LAB — CBC
HCT: 44.8 % (ref 39.0–52.0)
Hemoglobin: 15.1 g/dL (ref 13.0–17.0)
MCH: 32.3 pg (ref 26.0–34.0)
MCHC: 33.7 g/dL (ref 30.0–36.0)
MCV: 95.7 fL (ref 78.0–100.0)
PLATELETS: 157 10*3/uL (ref 150–400)
RBC: 4.68 MIL/uL (ref 4.22–5.81)
RDW: 13.6 % (ref 11.5–15.5)
WBC: 5.5 10*3/uL (ref 4.0–10.5)

## 2015-09-01 LAB — BASIC METABOLIC PANEL
Anion gap: 7 (ref 5–15)
BUN: 18 mg/dL (ref 6–20)
CALCIUM: 8.8 mg/dL — AB (ref 8.9–10.3)
CO2: 24 mmol/L (ref 22–32)
CREATININE: 0.86 mg/dL (ref 0.61–1.24)
Chloride: 107 mmol/L (ref 101–111)
GFR calc Af Amer: >60 mL/min (ref >60)
GLUCOSE: 111 mg/dL — AB (ref 65–99)
POTASSIUM: 4.4 mmol/L (ref 3.5–5.1)
SODIUM: 138 mmol/L (ref 135–145)

## 2015-09-01 LAB — I-STAT TROPONIN, ED
Troponin i, poc: 0 ng/mL (ref 0.00–0.08)
Troponin i, poc: 0 ng/mL (ref 0.00–0.08)

## 2015-09-01 NOTE — ED Notes (Signed)
Patient brought to room via wheelchair; patient undressed, in gown, on monitor, continuous pulse oximetry and blood pressure cuff

## 2015-09-01 NOTE — ED Provider Notes (Signed)
CSN: [id number]     Arrival date & time 09/01/15  0932 History   First MD Initiated Contact with Patient 09/01/15 0935     Chief Complaint  Patient presents with  . Chest Pain     (Consider location/radiation/quality/duration/timing/severity/associated sxs/prior Treatment) Patient is a 54 y.o. male presenting with chest pain. The history is provided by the patient.  Chest Pain Associated symptoms: cough   Associated symptoms: no abdominal pain, no back pain, no diaphoresis, no headache, no nausea, no numbness, no shortness of breath, not vomiting and no weakness    patient presents with chest pain. Has a history coronary artery disease. States that yesterday and again today he had an episode of sharp mid chest pain that lasted between 5 and 15 minutes. It began walking back to work from his break yesterday and after he got up from sleeping this morning. Both believed without intervention. No nausea vomiting. States it may feel like the previous stent requiring heart attack that he had. Patient states it was around 5 years ago when he has had negative stress test since. Last was approximately 2 years ago. He sees Dr. [name]. States he did not have this your stress test. He continues to smoke cigarettes.  Past Medical History  Diagnosis Date  . Coronary artery disease   . Hypertension   . Smoker   . Wears glasses   . History of ETOH abuse   . Stented coronary artery   . Myocardial infarction ([place]) 2009  . GERD (gastroesophageal reflux disease)   . Pure hypercholesterolemia   . Cardiomyopathy in other diseases classified elsewhere   . Anxiety   . Esophageal stricture   . Diverticulosis   . Tubular adenoma of colon 2012  . Depression    Past Surgical History  Procedure Laterality Date  . Cardiac catheterization  2009    placed 2 stents  . Partial colectomy  2011    sigmoid  . Tonsillectomy    . Wisdom tooth extraction    . Mouth surgery    . Appendectomy  2011    during  colectomy  . Inguinal hernia repair Left 09/16/2013    Procedure: HERNIA REPAIR INGUINAL ADULT;  Surgeon: [name], MD;  Location: [facility];  Service: General;  Laterality: Left;  . Hernia repair    . Coronary stent placement     Family History  Problem Relation Age of Onset  . Lung cancer Mother   . Heart disease Father     has pacemaker  . Breast cancer Sister   . Colon cancer Neg Hx   . Colon polyps Neg Hx   . Esophageal cancer Neg Hx   . Kidney disease Neg Hx   . Diabetes Neg Hx    Social History  Substance Use Topics  . Smoking status: Current Every Day Smoker -- 1.00 packs/day    Types: E-cigarettes, Cigarettes  . Smokeless tobacco: Never Used     Comment: Pt given handout to quit smoking  . Alcohol Use: No     Comment: stopped 4/14    Review of Systems  Constitutional: Negative for diaphoresis, activity change and appetite change.  Eyes: Negative for pain.  Respiratory: Positive for cough. Negative for chest tightness and shortness of breath.        Occasional cough without production  Cardiovascular: Positive for chest pain. Negative for leg swelling.  Gastrointestinal: Negative for nausea, vomiting, abdominal pain and diarrhea.  Genitourinary: Negative for flank pain.  Musculoskeletal: Negative for back pain and neck stiffness.  Skin: Negative for rash.  Neurological: Negative for weakness, numbness and headaches.  Psychiatric/Behavioral: Negative for behavioral problems.      Allergies  Morphine and related  Home Medications   Prior to Admission medications   Medication Sig Start Date End Date Taking? Authorizing Provider  lansoprazole (PREVACID) 30 MG capsule Take 1 capsule (30 mg total) by mouth daily at 12 noon. 08/20/14  Yes Ladene Artist, MD  aspirin 81 MG tablet Take 81 mg by mouth daily as needed for pain.     Historical Provider, MD  lisinopril (PRINIVIL,ZESTRIL) 5 MG tablet Take 5 mg by mouth daily.    Historical  Provider, MD  Multiple Vitamin (MULTIVITAMIN) tablet Take 1 tablet by mouth daily.    Historical Provider, MD  simvastatin (ZOCOR) 20 MG tablet Take 20 mg by mouth every evening.    Historical Provider, MD   BP 114/81 mmHg  Pulse 70  Temp(Src) 98.2 F (36.8 C) (Oral)  Resp 22  SpO2 95% Physical Exam  Constitutional: He appears well-developed.  HENT:  Head: Atraumatic.  Eyes: Pupils are equal, round, and reactive to light.  Neck: Neck supple. No JVD present.  Cardiovascular: Normal rate.   Pulmonary/Chest: Effort normal.  Abdominal: Soft.  Musculoskeletal: Normal range of motion.  Skin: Skin is warm.    ED Course  Procedures (including critical care time) Labs Review Labs Reviewed  BASIC METABOLIC PANEL - Abnormal; Notable for the following:    Glucose, Bld 111 (*)    Calcium 8.8 (*)    All other components within normal limits  CBC  I-STAT TROPOININ, ED  Randolm Idol, ED    Imaging Review Dg Chest 2 View  09/01/2015  CLINICAL DATA:  54 year old male with sternal chest pain for 2 days. Initial encounter. EXAM: CHEST  2 VIEW COMPARISON:  10/03/2009. FINDINGS: Larger lung volumes. Normal cardiac size and mediastinal contours. Visualized tracheal air column is within normal limits. No pneumothorax or pulmonary edema. No pleural effusion. There is confluent curvilinear opacity at the left costophrenic angle seen only on the PA view. No acute osseous abnormality identified. IMPRESSION: Streaky nonspecific opacity at the left lung base, favor atelectasis or scarring. Otherwise no acute cardiopulmonary abnormality. Electronically Signed   By: Genevie Ann M.D.   On: 09/01/2015 11:26   I have personally reviewed and evaluated these images and lab results as part of my medical decision-making.   EKG Interpretation   Date/Time:  Wednesday September 01 2015 09:39:36 EST Ventricular Rate:  86 PR Interval:  150 QRS Duration: 93 QT Interval:  356 QTC Calculation: 426 R Axis:    45 Text Interpretation:  Sinus rhythm Inferior infarct, old Confirmed by  Xhaiden Coombs  MD, Jaz Laningham [phone]) on 09/01/2015 9:47:54 AM      MDM   Final diagnoses:  Chest pain, unspecified chest pain type    Patient with chest pain. CAD history. Labs and xray reassuring. paient would not be admitted. Aware of risks. I discussed with Trish from [facility], and they will call for follow up appointment tomorrow. Also discussed with the patients sister, who is a provider with nephrology.     Davonna Belling, MD 09/02/15 [phone]

## 2015-09-01 NOTE — Discharge Instructions (Signed)
Nonspecific Chest Pain  °Chest pain can be caused by many different conditions. There is always a chance that your pain could be related to something serious, such as a heart attack or a blood clot in your lungs. Chest pain can also be caused by conditions that are not life-threatening. If you have chest pain, it is very important to follow up with your health care provider. °CAUSES  °Chest pain can be caused by: °· Heartburn. °· Pneumonia or bronchitis. °· Anxiety or stress. °· Inflammation around your heart (pericarditis) or lung (pleuritis or pleurisy). °· A blood clot in your lung. °· A collapsed lung (pneumothorax). It can develop suddenly on its own (spontaneous pneumothorax) or from trauma to the chest. °· Shingles infection (varicella-zoster virus). °· Heart attack. °· Damage to the bones, muscles, and cartilage that make up your chest wall. This can include: °¨ Bruised bones due to injury. °¨ Strained muscles or cartilage due to frequent or repeated coughing or overwork. °¨ Fracture to one or more ribs. °¨ Sore cartilage due to inflammation (costochondritis). °RISK FACTORS  °Risk factors for chest pain may include: °· Activities that increase your risk for trauma or injury to your chest. °· Respiratory infections or conditions that cause frequent coughing. °· Medical conditions or overeating that can cause heartburn. °· Heart disease or family history of heart disease. °· Conditions or health behaviors that increase your risk of developing a blood clot. °· Having had chicken pox (varicella zoster). °SIGNS AND SYMPTOMS °Chest pain can feel like: °· Burning or tingling on the surface of your chest or deep in your chest. °· Crushing, pressure, aching, or squeezing pain. °· Dull or sharp pain that is worse when you move, cough, or take a deep breath. °· Pain that is also felt in your back, neck, shoulder, or arm, or pain that spreads to any of these areas. °Your chest pain may come and go, or it may stay  constant. °DIAGNOSIS °Lab tests or other studies may be needed to find the cause of your pain. Your health care provider may have you take a test called an ambulatory ECG (electrocardiogram). An ECG records your heartbeat patterns at the time the test is performed. You may also have other tests, such as: °· Transthoracic echocardiogram (TTE). During echocardiography, sound waves are used to create a picture of all of the heart structures and to look at how blood flows through your heart. °· Transesophageal echocardiogram (TEE). This is a more advanced imaging test that obtains images from inside your body. It allows your health care provider to see your heart in finer detail. °· Cardiac monitoring. This allows your health care provider to monitor your heart rate and rhythm in real time. °· Holter monitor. This is a portable device that records your heartbeat and can help to diagnose abnormal heartbeats. It allows your health care provider to track your heart activity for several days, if needed. °· Stress tests. These can be done through exercise or by taking medicine that makes your heart beat more quickly. °· Blood tests. °· Imaging tests. °TREATMENT  °Your treatment depends on what is causing your chest pain. Treatment may include: °· Medicines. These may include: °¨ Acid blockers for heartburn. °¨ Anti-inflammatory medicine. °¨ Pain medicine for inflammatory conditions. °¨ Antibiotic medicine, if an infection is present. °¨ Medicines to dissolve blood clots. °¨ Medicines to treat coronary artery disease. °· Supportive care for conditions that do not require medicines. This may include: °¨ Resting. °¨ Applying heat   or cold packs to injured areas. °¨ Limiting activities until pain decreases. °HOME CARE INSTRUCTIONS °· If you were prescribed an antibiotic medicine, finish it all even if you start to feel better. °· Avoid any activities that bring on chest pain. °· Do not use any tobacco products, including  cigarettes, chewing tobacco, or electronic cigarettes. If you need help quitting, ask your health care provider. °· Do not drink alcohol. °· Take medicines only as directed by your health care provider. °· Keep all follow-up visits as directed by your health care provider. This is important. This includes any further testing if your chest pain does not go away. °· If heartburn is the cause for your chest pain, you may be told to keep your head raised (elevated) while sleeping. This reduces the chance that acid will go from your stomach into your esophagus. °· Make lifestyle changes as directed by your health care provider. These may include: °¨ Getting regular exercise. Ask your health care provider to suggest some activities that are safe for you. °¨ Eating a heart-healthy diet. A registered dietitian can help you to learn healthy eating options. °¨ Maintaining a healthy weight. °¨ Managing diabetes, if necessary. °¨ Reducing stress. °SEEK MEDICAL CARE IF: °· Your chest pain does not go away after treatment. °· You have a rash with blisters on your chest. °· You have a fever. °SEEK IMMEDIATE MEDICAL CARE IF:  °· Your chest pain is worse. °· You have an increasing cough, or you cough up blood. °· You have severe abdominal pain. °· You have severe weakness. °· You faint. °· You have chills. °· You have sudden, unexplained chest discomfort. °· You have sudden, unexplained discomfort in your arms, back, neck, or jaw. °· You have shortness of breath at any time. °· You suddenly start to sweat, or your skin gets clammy. °· You feel nauseous or you vomit. °· You suddenly feel light-headed or dizzy. °· Your heart begins to beat quickly, or it feels like it is skipping beats. °These symptoms may represent a serious problem that is an emergency. Do not wait to see if the symptoms will go away. Get medical help right away. Call your local emergency services (911 in the [place] Do not drive yourself to the hospital. °  °This  information is not intended to replace advice given to you by your health care provider. Make sure you discuss any questions you have with your health care provider. °  °Document Released: 06/28/2005 Document Revised: 10/09/2014 Document Reviewed: 04/24/2014 °[employer]vier Interactive Patient Education ©2016 [employer] ° °

## 2015-09-01 NOTE — ED Notes (Signed)
Pt states that he had central chest pain with no radiation that started yesterday. Pt states that this is similar to the pain that he had with his last MI. Pt denies any associated symptoms.

## 2015-09-02 ENCOUNTER — Ambulatory Visit (INDEPENDENT_AMBULATORY_CARE_PROVIDER_SITE_OTHER): Payer: 59 | Admitting: Cardiology

## 2015-09-02 ENCOUNTER — Encounter: Payer: Self-pay | Admitting: Cardiology

## 2015-09-02 VITALS — BP 128/80 | HR 87 | Ht 71.0 in | Wt 166.1 lb

## 2015-09-02 DIAGNOSIS — R079 Chest pain, unspecified: Secondary | ICD-10-CM | POA: Diagnosis not present

## 2015-09-02 MED ORDER — ATORVASTATIN CALCIUM 20 MG PO TABS: 20.0000 mg | ORAL_TABLET | Freq: Every day | ORAL | Status: DC

## 2015-09-02 NOTE — Progress Notes (Signed)
Cardiology Office Note   Date:  09/02/2015   ID:  Jeremiah Short, DOB June 19, 1961, MRN [id number]  PCP:   Melinda Crutch, MD  Cardiologist:   Candee Furbish, MD       History of Present Illness: Jeremiah Short is a 54 y.o. male who presents for reestablish care visit with history of coronary artery disease status post ST elevation myocardial infarction in 01/23/2008 with hypertension, smoker.  He was in the emergency department on 09/01/15 with chief complaint of chest pain and cough. He stated that he had a sharp mid chest discomfort lasting approximately 5-15 minutes that began when walking back to work from his break and also noted after he got up from sleeping early in the morning. Both episodes seem to be relieved without any intervention. He was concerned that the symptoms may feel like the previous stent requiring heart attack that he had had. It is been over 5 years since since last stress test. It has been quite a while since we have seen each other.  Chest pain started while walking back walking from one part of his place of employment to another.  He was not exerting himself.  He describes central pain felt like a "hot poker" and he describes pressure.  Associated symptoms were radiation into right arm and central back.  He denies dyspnea, diaphoresis, nausea or vomiting.  Lasted about 10-15 minutes. He sat down and attempted to move his arms around and belch and symptoms resolved.  Symptoms returned the following morning and he was evaluated in the ED.    He describes similar episodes since stents placed that occur 1-2x per month.  He says some of these episodes occur while eating and he feels they may be heartburn.  He does not know what made this episode different but he reports increase life stressors.    He has not taken any of his prescribed medications, including ASA, simvastatin in about 8 months.  He says he stopped ASA due to increase skin bleeding when cut but no major bleeds.   He stopped simvastatin because his cholesterol was okay and he was worried about ADRs.  He stopped ACEI about 1.5 years ago because he thought it made him sleepy.  Past Medical History  Diagnosis Date  . Coronary artery disease   . Hypertension   . Smoker   . Wears glasses   . History of ETOH abuse   . Stented coronary artery   . Myocardial infarction ([place]) 2009  . GERD (gastroesophageal reflux disease)   . Pure hypercholesterolemia   . Cardiomyopathy in other diseases classified elsewhere   . Anxiety   . Esophageal stricture   . Diverticulosis   . Tubular adenoma of colon 2012  . Depression     Past Surgical History  Procedure Laterality Date  . Cardiac catheterization  2009    placed 2 stents  . Partial colectomy  2011    sigmoid  . Tonsillectomy    . Wisdom tooth extraction    . Mouth surgery    . Appendectomy  2011    during colectomy  . Inguinal hernia repair Left 09/16/2013    Procedure: HERNIA REPAIR INGUINAL ADULT;  Surgeon: [name], MD;  Location: [facility];  Service: General;  Laterality: Left;  . Hernia repair    . Coronary stent placement       Current Outpatient Prescriptions  Medication Sig Dispense Refill  . aspirin 81 MG tablet Take 81  mg by mouth daily as needed for pain.     [name] lansoprazole (PREVACID) 30 MG capsule Take 1 capsule (30 mg total) by mouth daily at 12 noon. 90 capsule 3  . lisinopril (PRINIVIL,ZESTRIL) 5 MG tablet Take 5 mg by mouth daily.    . Multiple Vitamin (MULTIVITAMIN) tablet Take 1 tablet by mouth daily.    . simvastatin (ZOCOR) 20 MG tablet Take 20 mg by mouth every evening.     No current facility-administered medications for this visit.    Allergies:   Morphine and related    Social History:  The patient  reports that he has been smoking E-cigarettes and Cigarettes.  He has been smoking about 1.00 pack per day. He has never used smokeless tobacco. He reports that he does not drink alcohol or use  illicit drugs.   Family History:  The patient's family history includes Breast cancer in his sister; Heart disease in his father; Lung cancer in his mother. There is no history of Colon cancer, Colon polyps, Esophageal cancer, Kidney disease, or Diabetes.    ROS:  Please see the history of present illness.   Otherwise, review of systems are positive for none.   All other systems are reviewed and negative.    PHYSICAL EXAM: VS:  BP 128/80 mmHg  Pulse 87  Ht 5\' 11"  (1.803 m)  Wt 166 lb 1.9 oz (75.352 kg)  BMI 23.18 kg/m2 , BMI Body mass index is 23.18 kg/(m^2). GEN: Well nourished, thin, in no acute distress HEENT: normal Neck: no JVD, carotid bruits, or masses Cardiac: RRR; no murmurs, rubs, or gallops,no edema  Respiratory:  clear to auscultation bilaterally, normal work of breathing GI: soft, nontender, nondistended, + BS MS: no deformity or atrophy Skin: warm and dry, no rash Neuro:  Strength and sensation are intact Psych: euthymic mood, full affect   EKG:  09/01/15 -    Recent Labs: 09/01/2015: BUN 18; Creatinine, Ser 0.86; Hemoglobin 15.1; Platelets 157; Potassium 4.4; Sodium 138    Lipid Panel    Component Value Date/Time   CHOL  01/24/2008 0320    100        ATP III CLASSIFICATION:  <200     mg/dL   Desirable  200-239  mg/dL   Borderline High  >=240    mg/dL   High   TRIG 77 01/24/2008 0320   HDL 32* 01/24/2008 0320   CHOLHDL 3.1 01/24/2008 0320   VLDL 15 01/24/2008 0320   LDLCALC  01/24/2008 0320    53        Total Cholesterol/HDL:CHD Risk Coronary Heart Disease Risk Table                     Men   Women  1/2 Average Risk   3.4   3.3      Wt Readings from Last 3 Encounters:  12/02/14 157 lb (71.215 kg)  09/29/14 165 lb (74.844 kg)  09/06/14 163 lb (73.936 kg)      Other studies Reviewed: Additional studies/ records that were reviewed today include: Prior labs, records reviewed. Review of the above records demonstrates: As above   ASSESSMENT  AND PLAN:   Chest discomfort -Concerning enough to go to the emergency department on 09/01/15 -2 separate episodes -Prior history of stent placement, both drug-eluting as well as bare-metal stent to his LAD in the setting of anterior infarction. -will stress him for risk stratification since some of the features are atypical for ischemic  pain -If symptoms worsen, would encourage beta blocker as anti-anginal or perhaps isosorbide as long as stress test is low risk. If abnormal, cardiac catheterization.  Old myocardial infarction -2009, stent placement, STEMI  Coronary artery disease -History of stent placement 2009 -will stress as above -Spoke of the importance of prevention strategies.  Essential hypertension  - he stopped his ACEI over 1 year ago -well controlled without medication   Hyperlipidemia -Was on simvastatin 20 mg -Would prefer high intensity statin given his prior history of CAD/MI - will start atorvastatin 20mg  daily    Current medicines are reviewed at length with the patient today.  The patient does not have concerns regarding medicines.  He is resistant to taking statin or ASA.  Risk/benefit reviewed with him.  The following changes have been made:  Change simvastatin to atorvastatn 20mg  daily.  Resume ASA 81mg .  Labs/ tests ordered today include: exercise stress test No orders of the defined types were placed in this encounter.     Disposition:   FU with me in 2 months  Signed, Candee Furbish, MD  09/02/2015 1:37 PM    [facility] [place], [place], [place]  [address] Phone: [phone]; Fax: [phone]

## 2015-09-02 NOTE — Patient Instructions (Signed)
Medication Instructions:  Please restart your ASA. Stop Simvastatin and start atorvastatin 20 mg daily. Continue all other medications as listed.  Testing/Procedures: Your physician has requested that you have an exercise tolerance test. For further information please visit [email or web address]. Please also follow instruction sheet, as given.  Follow-Up: Follow up in 2 months with Dr [name].  If you need a refill on your cardiac medications before your next appointment, please call your pharmacy.  Thank you for choosing [facility]!!

## 2015-09-23 ENCOUNTER — Telehealth: Payer: Self-pay | Admitting: Cardiology

## 2015-09-23 NOTE — Telephone Encounter (Signed)
OK to take Chantix during Myoview.  Pt aware.

## 2015-09-23 NOTE — Telephone Encounter (Signed)
New Message  Pt c/o medication issue: 1. Name of Medication: Chantix  4. What is your medication issue? Pt req a call back to discuss if it is ok for him to take this medication before the exercise tolerance test

## 2015-09-28 ENCOUNTER — Ambulatory Visit (INDEPENDENT_AMBULATORY_CARE_PROVIDER_SITE_OTHER): Payer: 59

## 2015-09-28 ENCOUNTER — Encounter: Payer: 59 | Admitting: Cardiology

## 2015-09-28 DIAGNOSIS — R079 Chest pain, unspecified: Secondary | ICD-10-CM | POA: Diagnosis not present

## 2015-09-28 LAB — EXERCISE TOLERANCE TEST
Estimated workload: 9.3 METS
Exercise duration (min): 7 min
Exercise duration (sec): 30 s
MPHR: 165 {beats}/min
Peak HR: 163 {beats}/min
Percent HR: 99 %
RPE: 16
Rest HR: 86 {beats}/min

## 2015-10-08 ENCOUNTER — Telehealth: Payer: Self-pay | Admitting: *Deleted

## 2015-10-08 NOTE — Telephone Encounter (Signed)
Pt notified of stress test results by phone with verbal understanding.

## 2015-10-28 ENCOUNTER — Other Ambulatory Visit: Payer: Self-pay | Admitting: Cardiology

## 2015-10-28 MED ORDER — ATORVASTATIN CALCIUM 20 MG PO TABS: 20.0000 mg | ORAL_TABLET | Freq: Every day | ORAL | Status: DC

## 2015-11-04 ENCOUNTER — Encounter: Payer: Self-pay | Admitting: Cardiology

## 2015-11-04 ENCOUNTER — Ambulatory Visit (INDEPENDENT_AMBULATORY_CARE_PROVIDER_SITE_OTHER): Payer: 59 | Admitting: Cardiology

## 2015-11-04 VITALS — BP 116/86 | HR 54 | Ht 71.0 in | Wt 172.6 lb

## 2015-11-04 DIAGNOSIS — I2583 Coronary atherosclerosis due to lipid rich plaque: Principal | ICD-10-CM

## 2015-11-04 DIAGNOSIS — I10 Essential (primary) hypertension: Secondary | ICD-10-CM | POA: Diagnosis not present

## 2015-11-04 DIAGNOSIS — E7800 Pure hypercholesterolemia, unspecified: Secondary | ICD-10-CM

## 2015-11-04 DIAGNOSIS — I2510 Atherosclerotic heart disease of native coronary artery without angina pectoris: Secondary | ICD-10-CM | POA: Diagnosis not present

## 2015-11-04 DIAGNOSIS — I252 Old myocardial infarction: Secondary | ICD-10-CM

## 2015-11-04 NOTE — Patient Instructions (Signed)
Medication Instructions:  The current medical regimen is effective;  continue present plan and medications.  Follow-Up: Follow up in 1 year with Dr. [name].  You will receive a letter in the mail 2 months before you are due.  Please call us when you receive this letter to schedule your follow up appointment.  If you need a refill on your cardiac medications before your next appointment, please call your pharmacy.  Thank you for choosing [facility]!!    Atorvastatin Simvastatin Rosuvastatin

## 2015-11-04 NOTE — Progress Notes (Signed)
Cardiology Office Note   Date:  11/04/2015   ID:  Jeremiah Short, DOB 1961-07-20, MRN [id number]  PCP:   Jeremiah Crutch, MD  Cardiologist:   Jeremiah Furbish, MD       History of Present Illness: Jeremiah Short is a 55 y.o. male who presents for follow-up visit with history of coronary artery disease status post ST elevation myocardial infarction in 01/23/2008 with hypertension, smoker.  Prior visit reviewed:  He was in the emergency department on 09/01/15 with chief complaint of chest pain and cough. He stated that he had a sharp mid chest discomfort lasting approximately 5-15 minutes that began when walking back to work from his break and also noted after he got up from sleeping early in the morning. Both episodes seem to be relieved without any intervention. He was concerned that the symptoms may feel like the previous stent requiring heart attack that he had had. It is been over 5 years since since last stress test.   Chest pain started while walking back walking from one part of his place of employment to another.  He was not exerting himself.  He describes central pain felt like a "hot poker" and he describes pressure.  Associated symptoms were radiation into right arm and central back.  He denies dyspnea, diaphoresis, nausea or vomiting.  Lasted about 10-15 minutes. He sat down and attempted to move his arms around and belch and symptoms resolved.  Symptoms returned the following morning and he was evaluated in the ED.    He describes similar episodes since stents placed that occur 1-2x per month.  He says some of these episodes occur while eating and he feels they may be heartburn.  He does not know what made this episode different but he reports increase life stressors.    He previously had not taken any of his prescribed medications, including ASA, simvastatin in about 8 months.  He says he stopped ASA due to increase skin bleeding when cut but no major bleeds.  He stopped simvastatin  because his cholesterol was okay and he was worried about ADRs.  He stopped ACEI about 1.5 years ago because he thought it made him sleepy.  Since her last visit, he had a stress test which was unremarkable, low risk. He is no longer having chest discomfort symptoms. He did have one bout of what he thought was indigestion after eating [place] and laying down. He said it is remarkable how similar GERD feels to his prior heart attack.  Past Medical History  Diagnosis Date  . Coronary artery disease   . Hypertension   . Smoker   . Wears glasses   . History of ETOH abuse   . Stented coronary artery   . Myocardial infarction ([place]) 2009  . GERD (gastroesophageal reflux disease)   . Pure hypercholesterolemia   . Cardiomyopathy in other diseases classified elsewhere   . Anxiety   . Esophageal stricture   . Diverticulosis   . Tubular adenoma of colon 2012  . Depression     Past Surgical History  Procedure Laterality Date  . Cardiac catheterization  2009    placed 2 stents  . Partial colectomy  2011    sigmoid  . Tonsillectomy    . Wisdom tooth extraction    . Mouth surgery    . Appendectomy  2011    during colectomy  . Inguinal hernia repair Left 09/16/2013    Procedure: HERNIA REPAIR INGUINAL ADULT;  Surgeon: Marcello Moores  Nydia Bouton, MD;  Location: [facility];  Service: General;  Laterality: Left;  . Hernia repair    . Coronary stent placement       Current Outpatient Prescriptions  Medication Sig Dispense Refill  . aspirin 81 MG tablet Take 81 mg by mouth daily as needed for pain.     Hendricks Limes CONTINUING MONTH PAK 1 MG tablet Take 1 mg by mouth 2 (two) times daily.  2  . lansoprazole (PREVACID) 30 MG capsule Take 1 capsule (30 mg total) by mouth daily at 12 noon. 90 capsule 3  . Multiple Vitamin (MULTIVITAMIN) tablet Take 1 tablet by mouth daily.     No current facility-administered medications for this visit.    Allergies:   Morphine and related     Social History:  The patient  reports that he has been smoking E-cigarettes and Cigarettes.  He has been smoking about 1.00 pack per day. He has never used smokeless tobacco. He reports that he does not drink alcohol or use illicit drugs.   Family History:  The patient's family history includes Breast cancer in his sister; Heart disease in his father; Lung cancer in his mother. There is no history of Colon cancer, Colon polyps, Esophageal cancer, Kidney disease, or Diabetes.    ROS:  Please see the history of present illness.   Otherwise, review of systems are positive for none.   All other systems are reviewed and negative.    PHYSICAL EXAM: VS:  BP 116/86 mmHg  Pulse 54  Ht 5\' 11"  (1.803 m)  Wt 172 lb 9.6 oz (78.291 kg)  BMI 24.08 kg/m2 , BMI Body mass index is 24.08 kg/(m^2). GEN: Well nourished, thin, in no acute distress HEENT: normal Neck: no JVD, carotid bruits, or masses Cardiac: RRR; no murmurs, rubs, or gallops,no edema  Respiratory:  clear to auscultation bilaterally, normal work of breathing GI: soft, nontender, nondistended, + BS MS: no deformity or atrophy Skin: warm and dry, no rash Neuro:  Strength and sensation are intact Psych: euthymic mood, full affect   EKG:  09/01/15-sinus rhythm, nonspecific ST-T wave changes.   Recent Labs: 09/01/2015: BUN 18; Creatinine, Ser 0.86; Hemoglobin 15.1; Platelets 157; Potassium 4.4; Sodium 138    Lipid Panel    Component Value Date/Time   CHOL  01/24/2008 0320    100        ATP III CLASSIFICATION:  <200     mg/dL   Desirable  200-239  mg/dL   Borderline High  >=240    mg/dL   High   TRIG 77 01/24/2008 0320   HDL 32* 01/24/2008 0320   CHOLHDL 3.1 01/24/2008 0320   VLDL 15 01/24/2008 0320   LDLCALC  01/24/2008 0320    53        Total Cholesterol/HDL:CHD Risk Coronary Heart Disease Risk Table                     Men   Women  1/2 Average Risk   3.4   3.3      Wt Readings from Last 3 Encounters:  11/04/15  172 lb 9.6 oz (78.291 kg)  09/02/15 166 lb 1.9 oz (75.352 kg)  12/02/14 157 lb (71.215 kg)      Other studies Reviewed: Additional studies/ records that were reviewed today include: Prior labs, records reviewed. Review of the above records demonstrates: As above   ASSESSMENT AND PLAN:   Chest discomfort -Concerning enough to go to  the emergency department on 09/01/15 -2 separate episodes -Prior history of stent placement, both drug-eluting as well as bare-metal stent to his LAD in the setting of anterior infarction. -stress test exercise treadmill test on 09/28/15 was unremarkable, low risk -If symptoms worsen, would encourage beta blocker as anti-anginal or perhaps isosorbide. -We could also consider diagnostic cardiac catheterization if symptoms become more worrisome. We discussed at length. He will let me know.   Old myocardial infarction -2009, stent placement, STEMI  Coronary artery disease -History of stent placement 2009 -Spoke of the importance of prevention strategies.  Essential hypertension  - he stopped his ACEI over 1 year ago -well controlled without medication   Hyperlipidemia -Was on simvastatin 20 mg -Would prefer high intensity statin given his prior history of CAD/MI - started atorvastatin 20mg  daily. Interestingly, atorvastatin and was $20 for him. He asked for a list of different cholesterol medications to check with his insurance company.  Tobacco use -Encourage tobacco cessation. This is very important for his overall heart health.    Current medicines are reviewed at length with the patient today.  The patient does not have concerns regarding medicines.  He is resistant to taking statin or ASA.  Risk/benefit reviewed with him.  The following changes have been made:  Change simvastatin to atorvastatn 20mg  daily.  Resume ASA 81mg .  Labs/ tests ordered today include: exercise stress test No orders of the defined types were placed in this  encounter.     Disposition:   FU with me in 6 months  Signed, Jeremiah Furbish, MD  11/04/2015 8:51 AM    [facility] [place], [place], [place]  [address] Phone: [phone]; Fax: [phone]

## 2016-09-27 ENCOUNTER — Inpatient Hospital Stay (HOSPITAL_COMMUNITY)
Admission: EM | Admit: 2016-09-27 | Discharge: 2016-09-29 | DRG: 247 | Disposition: A | Discharge status: Home / Self Care | Payer: 59 | Attending: Cardiology | Admitting: Cardiology

## 2016-09-27 ENCOUNTER — Encounter (HOSPITAL_COMMUNITY): Payer: Self-pay | Admitting: Emergency Medicine

## 2016-09-27 ENCOUNTER — Emergency Department (HOSPITAL_COMMUNITY): Payer: 59

## 2016-09-27 ENCOUNTER — Encounter (HOSPITAL_COMMUNITY)
Admission: EM | Disposition: A | Discharge status: Home / Self Care | Payer: Self-pay | Source: Home / Self Care | Attending: Cardiology

## 2016-09-27 DIAGNOSIS — Z955 Presence of coronary angioplasty implant and graft: Secondary | ICD-10-CM | POA: Diagnosis not present

## 2016-09-27 DIAGNOSIS — I2510 Atherosclerotic heart disease of native coronary artery without angina pectoris: Secondary | ICD-10-CM | POA: Diagnosis present

## 2016-09-27 DIAGNOSIS — F17290 Nicotine dependence, other tobacco product, uncomplicated: Secondary | ICD-10-CM | POA: Diagnosis present

## 2016-09-27 DIAGNOSIS — Z9119 Patient's noncompliance with other medical treatment and regimen: Secondary | ICD-10-CM | POA: Diagnosis not present

## 2016-09-27 DIAGNOSIS — I213 ST elevation (STEMI) myocardial infarction of unspecified site: Secondary | ICD-10-CM

## 2016-09-27 DIAGNOSIS — Z8249 Family history of ischemic heart disease and other diseases of the circulatory system: Secondary | ICD-10-CM

## 2016-09-27 DIAGNOSIS — I2102 ST elevation (STEMI) myocardial infarction involving left anterior descending coronary artery: Secondary | ICD-10-CM | POA: Diagnosis not present

## 2016-09-27 DIAGNOSIS — F329 Major depressive disorder, single episode, unspecified: Secondary | ICD-10-CM | POA: Diagnosis present

## 2016-09-27 DIAGNOSIS — I255 Ischemic cardiomyopathy: Secondary | ICD-10-CM | POA: Diagnosis not present

## 2016-09-27 DIAGNOSIS — Z8371 Family history of colonic polyps: Secondary | ICD-10-CM | POA: Diagnosis not present

## 2016-09-27 DIAGNOSIS — Z79899 Other long term (current) drug therapy: Secondary | ICD-10-CM

## 2016-09-27 DIAGNOSIS — I110 Hypertensive heart disease with heart failure: Secondary | ICD-10-CM | POA: Diagnosis present

## 2016-09-27 DIAGNOSIS — Z7982 Long term (current) use of aspirin: Secondary | ICD-10-CM

## 2016-09-27 DIAGNOSIS — F1010 Alcohol abuse, uncomplicated: Secondary | ICD-10-CM | POA: Diagnosis present

## 2016-09-27 DIAGNOSIS — E7800 Pure hypercholesterolemia, unspecified: Secondary | ICD-10-CM | POA: Diagnosis present

## 2016-09-27 DIAGNOSIS — Z885 Allergy status to narcotic agent status: Secondary | ICD-10-CM

## 2016-09-27 DIAGNOSIS — E785 Hyperlipidemia, unspecified: Secondary | ICD-10-CM | POA: Diagnosis present

## 2016-09-27 DIAGNOSIS — K219 Gastro-esophageal reflux disease without esophagitis: Secondary | ICD-10-CM | POA: Diagnosis present

## 2016-09-27 DIAGNOSIS — Z720 Tobacco use: Secondary | ICD-10-CM | POA: Diagnosis not present

## 2016-09-27 DIAGNOSIS — F17210 Nicotine dependence, cigarettes, uncomplicated: Secondary | ICD-10-CM | POA: Diagnosis present

## 2016-09-27 DIAGNOSIS — I509 Heart failure, unspecified: Secondary | ICD-10-CM | POA: Diagnosis present

## 2016-09-27 DIAGNOSIS — I959 Hypotension, unspecified: Secondary | ICD-10-CM | POA: Diagnosis not present

## 2016-09-27 DIAGNOSIS — I2109 ST elevation (STEMI) myocardial infarction involving other coronary artery of anterior wall: Secondary | ICD-10-CM | POA: Diagnosis not present

## 2016-09-27 HISTORY — PX: CARDIAC CATHETERIZATION: SHX172

## 2016-09-27 LAB — CBC
HCT: 46.4 % (ref 39.0–52.0)
Hemoglobin: 16.1 g/dL (ref 13.0–17.0)
MCH: 33.5 pg (ref 26.0–34.0)
MCHC: 34.7 g/dL (ref 30.0–36.0)
MCV: 96.5 fL (ref 78.0–100.0)
PLATELETS: 170 10*3/uL (ref 150–400)
RBC: 4.81 MIL/uL (ref 4.22–5.81)
RDW: 12.9 % (ref 11.5–15.5)
WBC: 4.6 10*3/uL (ref 4.0–10.5)

## 2016-09-27 LAB — BASIC METABOLIC PANEL
Anion gap: 10 (ref 5–15)
BUN: 9 mg/dL (ref 6–20)
CO2: 23 mmol/L (ref 22–32)
CREATININE: 0.85 mg/dL (ref 0.61–1.24)
Calcium: 8.7 mg/dL — ABNORMAL LOW (ref 8.9–10.3)
Chloride: 107 mmol/L (ref 101–111)
GFR calc Af Amer: >60 mL/min (ref >60)
GFR calc non Af Amer: >60 mL/min (ref >60)
GLUCOSE: 95 mg/dL (ref 65–99)
Potassium: 3.6 mmol/L (ref 3.5–5.1)
Sodium: 140 mmol/L (ref 135–145)

## 2016-09-27 LAB — PROTIME-INR
INR: 0.99
Prothrombin Time: 13.1 seconds (ref 11.4–15.2)

## 2016-09-27 LAB — I-STAT CHEM 8, ED
BUN: 11 mg/dL (ref 6–20)
CHLORIDE: 104 mmol/L (ref 101–111)
CREATININE: 1.1 mg/dL (ref 0.61–1.24)
Calcium, Ion: 1.11 mmol/L — ABNORMAL LOW (ref 1.15–1.40)
GLUCOSE: 96 mg/dL (ref 65–99)
HCT: 46 % (ref 39.0–52.0)
HEMOGLOBIN: 15.6 g/dL (ref 13.0–17.0)
POTASSIUM: 3.6 mmol/L (ref 3.5–5.1)
Sodium: 143 mmol/L (ref 135–145)
TCO2: 24 mmol/L (ref 0–100)

## 2016-09-27 LAB — I-STAT TROPONIN, ED: Troponin i, poc: 0.06 ng/mL (ref 0.00–0.08)

## 2016-09-27 SURGERY — LEFT HEART CATH AND CORONARY ANGIOGRAPHY
Anesthesia: LOCAL

## 2016-09-27 MED ORDER — HEPARIN (PORCINE) IN NACL 2-0.9 UNIT/ML-% IJ SOLN
INTRAMUSCULAR | Status: AC
  Filled 2016-09-27: qty 1000

## 2016-09-27 MED ORDER — ASPIRIN 81 MG PO CHEW
CHEWABLE_TABLET | ORAL | Status: AC
  Filled 2016-09-27: qty 4

## 2016-09-27 MED ORDER — HEPARIN SODIUM (PORCINE) 5000 UNIT/ML IJ SOLN
INTRAMUSCULAR | Status: AC
  Administered 2016-09-27: 4000 [IU]
  Filled 2016-09-27: qty 1

## 2016-09-27 MED ORDER — NITROGLYCERIN 2 % TD OINT
1.0000 [in_us] | TOPICAL_OINTMENT | Freq: Once | TRANSDERMAL | Status: AC
  Administered 2016-09-27: 1 [in_us] via TOPICAL
  Filled 2016-09-27: qty 1

## 2016-09-27 MED ORDER — SODIUM CHLORIDE 0.9 % IV SOLN
INTRAVENOUS | Status: DC | PRN
  Administered 2016-09-27: 250 mL/h via INTRAVENOUS

## 2016-09-27 MED ORDER — SODIUM CHLORIDE 0.9 % IV SOLN: 250.0000 mL | INTRAVENOUS | Status: DC | PRN

## 2016-09-27 MED ORDER — SODIUM CHLORIDE 0.9% FLUSH: 3.0000 mL | INTRAVENOUS | Status: DC | PRN

## 2016-09-27 MED ORDER — ASPIRIN EC 81 MG PO TBEC
81.0000 mg | DELAYED_RELEASE_TABLET | Freq: Every day | ORAL | Status: DC
  Administered 2016-09-28 – 2016-09-29 (×2): 81 mg via ORAL
  Filled 2016-09-27 (×2): qty 1

## 2016-09-27 MED ORDER — VERAPAMIL HCL 2.5 MG/ML IV SOLN
INTRAVENOUS | Status: AC
  Filled 2016-09-27: qty 2

## 2016-09-27 MED ORDER — SODIUM CHLORIDE 0.9% FLUSH
3.0000 mL | Freq: Two times a day (BID) | INTRAVENOUS | Status: DC
  Administered 2016-09-27 – 2016-09-29 (×3): 3 mL via INTRAVENOUS

## 2016-09-27 MED ORDER — HYDROMORPHONE BOLUS VIA INFUSION: 0.5000 mg | Freq: Once | INTRAVENOUS | Status: DC

## 2016-09-27 MED ORDER — LIDOCAINE HCL (PF) 1 % IJ SOLN
INTRAMUSCULAR | Status: DC | PRN
  Administered 2016-09-27: 2 mL

## 2016-09-27 MED ORDER — SODIUM CHLORIDE 0.9 % IV BOLUS (SEPSIS)
INTRAVENOUS | Status: DC | PRN
  Administered 2016-09-27: 250 mL via INTRAVENOUS

## 2016-09-27 MED ORDER — HEPARIN SODIUM (PORCINE) 5000 UNIT/ML IJ SOLN: 4000.0000 [IU] | INTRAMUSCULAR | Status: AC

## 2016-09-27 MED ORDER — BIVALIRUDIN BOLUS VIA INFUSION - CUPID
INTRAVENOUS | Status: DC | PRN
  Administered 2016-09-27: 61.2 mg via INTRAVENOUS

## 2016-09-27 MED ORDER — NITROGLYCERIN 0.4 MG SL SUBL: 0.4000 mg | SUBLINGUAL_TABLET | SUBLINGUAL | Status: DC | PRN

## 2016-09-27 MED ORDER — IOPAMIDOL (ISOVUE-370) INJECTION 76%
INTRAVENOUS | Status: AC
  Filled 2016-09-27: qty 50

## 2016-09-27 MED ORDER — TICAGRELOR 90 MG PO TABS
ORAL_TABLET | ORAL | Status: DC | PRN
  Administered 2016-09-27: 180 mg via ORAL

## 2016-09-27 MED ORDER — HYDROMORPHONE HCL 2 MG/ML IJ SOLN
INTRAMUSCULAR | Status: AC
  Filled 2016-09-27: qty 1

## 2016-09-27 MED ORDER — HYDRALAZINE HCL 20 MG/ML IJ SOLN: 5.0000 mg | INTRAMUSCULAR | Status: AC | PRN

## 2016-09-27 MED ORDER — FENTANYL CITRATE (PF) 100 MCG/2ML IJ SOLN
INTRAMUSCULAR | Status: DC | PRN
  Administered 2016-09-27: 25 ug via INTRAVENOUS
  Administered 2016-09-27: 50 ug via INTRAVENOUS

## 2016-09-27 MED ORDER — HEPARIN (PORCINE) IN NACL 2-0.9 UNIT/ML-% IJ SOLN
INTRAMUSCULAR | Status: DC | PRN
  Administered 2016-09-27: 1000 mL

## 2016-09-27 MED ORDER — SODIUM CHLORIDE 0.9 % WEIGHT BASED INFUSION: 1.0000 mL/kg/h | INTRAVENOUS | Status: AC

## 2016-09-27 MED ORDER — FENTANYL CITRATE (PF) 100 MCG/2ML IJ SOLN
INTRAMUSCULAR | Status: AC
  Filled 2016-09-27: qty 2

## 2016-09-27 MED ORDER — BIVALIRUDIN 250 MG IV SOLR
INTRAVENOUS | Status: DC | PRN
  Administered 2016-09-27: 1.75 mg/kg/h via INTRAVENOUS

## 2016-09-27 MED ORDER — IOPAMIDOL (ISOVUE-370) INJECTION 76%
INTRAVENOUS | Status: AC
  Filled 2016-09-27: qty 125

## 2016-09-27 MED ORDER — IOPAMIDOL (ISOVUE-370) INJECTION 76%
INTRAVENOUS | Status: DC | PRN
  Administered 2016-09-27: 125 mL via INTRA_ARTERIAL

## 2016-09-27 MED ORDER — LIDOCAINE HCL (PF) 1 % IJ SOLN
INTRAMUSCULAR | Status: AC
  Filled 2016-09-27: qty 30

## 2016-09-27 MED ORDER — HYDROMORPHONE HCL 2 MG/ML IJ SOLN
0.5000 mg | Freq: Once | INTRAMUSCULAR | Status: AC
  Administered 2016-09-27: 0.5 mg via INTRAVENOUS

## 2016-09-27 MED ORDER — NITROGLYCERIN 1 MG/10 ML FOR IR/CATH LAB
INTRA_ARTERIAL | Status: AC
  Filled 2016-09-27: qty 10

## 2016-09-27 MED ORDER — VERAPAMIL HCL 2.5 MG/ML IV SOLN
INTRA_ARTERIAL | Status: DC | PRN
  Administered 2016-09-27: 15 mL via INTRA_ARTERIAL

## 2016-09-27 MED ORDER — LABETALOL HCL 5 MG/ML IV SOLN: 10.0000 mg | INTRAVENOUS | Status: AC | PRN

## 2016-09-27 MED ORDER — ONDANSETRON HCL 4 MG/2ML IJ SOLN: 4.0000 mg | Freq: Four times a day (QID) | INTRAMUSCULAR | Status: DC | PRN

## 2016-09-27 MED ORDER — BIVALIRUDIN 250 MG IV SOLR
INTRAVENOUS | Status: AC
  Filled 2016-09-27: qty 250

## 2016-09-27 MED ORDER — CARVEDILOL 3.125 MG PO TABS
3.1250 mg | ORAL_TABLET | Freq: Two times a day (BID) | ORAL | Status: DC
  Administered 2016-09-28: 3.125 mg via ORAL
  Filled 2016-09-27: qty 1

## 2016-09-27 MED ORDER — SODIUM CHLORIDE 0.9% FLUSH
3.0000 mL | Freq: Two times a day (BID) | INTRAVENOUS | Status: DC
  Administered 2016-09-29: 3 mL via INTRAVENOUS

## 2016-09-27 MED ORDER — ATORVASTATIN CALCIUM 80 MG PO TABS
80.0000 mg | ORAL_TABLET | Freq: Every day | ORAL | Status: DC
  Administered 2016-09-28: 80 mg via ORAL
  Filled 2016-09-27: qty 1

## 2016-09-27 MED ORDER — ASPIRIN 81 MG PO CHEW
324.0000 mg | CHEWABLE_TABLET | Freq: Once | ORAL | Status: AC
  Administered 2016-09-27: 324 mg via ORAL

## 2016-09-27 MED ORDER — TICAGRELOR 90 MG PO TABS
90.0000 mg | ORAL_TABLET | Freq: Two times a day (BID) | ORAL | Status: DC
  Administered 2016-09-28 – 2016-09-29 (×3): 90 mg via ORAL
  Filled 2016-09-27 (×3): qty 1

## 2016-09-27 SURGICAL SUPPLY — 21 items
BALLN EMERGE MR 2.0X12 (BALLOONS) ×2
BALLN EMERGE MR 2.5X12 (BALLOONS) ×2
BALLN ~~LOC~~ EMERGE MR 3.5X15 (BALLOONS) ×2
BALLOON EMERGE MR 2.0X12 (BALLOONS) IMPLANT
BALLOON EMERGE MR 2.5X12 (BALLOONS) IMPLANT
BALLOON ~~LOC~~ EMERGE MR 3.5X15 (BALLOONS) IMPLANT
CATH DIAG 6FR PIGTAIL ANGLED (CATHETERS) ×1 IMPLANT
CATH HEARTRAIL 6F IL4.0 (CATHETERS) ×1 IMPLANT
CATH OPTITORQUE TIG 4.0 5F (CATHETERS) ×1 IMPLANT
DEVICE RAD COMP TR BAND LRG (VASCULAR PRODUCTS) ×1 IMPLANT
GLIDESHEATH SLEND A-KIT 6F 20G (SHEATH) ×1 IMPLANT
GUIDEWIRE INQWIRE 1.5J.035X260 (WIRE) IMPLANT
INQWIRE 1.5J .035X260CM (WIRE) ×2
KIT ENCORE 26 ADVANTAGE (KITS) ×1 IMPLANT
KIT HEART LEFT (KITS) ×2 IMPLANT
PACK CARDIAC CATHETERIZATION (CUSTOM PROCEDURE TRAY) ×2 IMPLANT
STENT RESOLUTE ONYX 3.0X22 (Permanent Stent) ×1 IMPLANT
TRANSDUCER W/STOPCOCK (MISCELLANEOUS) ×2 IMPLANT
TUBING CIL FLEX 10 FLL-RA (TUBING) ×2 IMPLANT
WIRE COUGAR XT STRL 190CM (WIRE) ×1 IMPLANT
WIRE HI TORQ WHISPER MS 190CM (WIRE) ×1 IMPLANT

## 2016-09-27 NOTE — ED Notes (Signed)
Patient arrived from triage.with c/o midsternal CP, arms aching, nauseated and diaphoretic.  Patient stated he was sleeping and was awakened with CP about 7pm.  History of MI about 7 years ago

## 2016-09-27 NOTE — ED Triage Notes (Signed)
Patient reports central chest pain with SOB and nausea onset this evening , syncopal episode while at triage , his cardiologist is Dr. Fuller Plan .

## 2016-09-27 NOTE — H&P (Signed)
Jeremiah Short is an 55 y.o. male.   Chief Complaint: Chest pain HPI: Jeremiah Short  is a 55 y.o. male  With known coronary artery disease and history of stent implantation, and he does not known in 2009, hypertension, patient states normal lipids, hence had discontinued statins, has been taking aspirin on a occasional basis, continued tobacco use disorder, presented with acute onset of chest pain with radiation to both arms. Due to persistent pain for approximately couple hours, presented to the emergency room, in the ED was found to have ST elevation in anterolateral leads. STEMI was activated. He was taken emergently to cut a catheterization lab.  Denies any symptoms of claudication or TIA. No prior history of GI bleed, TIA. Otherwise associated dyspnea with chest discomfort present along with mild diaphoresis.  Past Medical History:  Diagnosis Date  . Anxiety   . Cardiomyopathy in other diseases classified elsewhere   . Coronary artery disease   . Depression   . Diverticulosis   . Esophageal stricture   . GERD (gastroesophageal reflux disease)   . History of ETOH abuse   . Hypertension   . Myocardial infarction 2009  . Pure hypercholesterolemia   . Smoker   . Stented coronary artery   . Tubular adenoma of colon 2012  . Wears glasses     Past Surgical History:  Procedure Laterality Date  . APPENDECTOMY  2011   during colectomy  . CARDIAC CATHETERIZATION  2009   placed 2 stents  . CORONARY STENT PLACEMENT    . HERNIA REPAIR    . INGUINAL HERNIA REPAIR Left 09/16/2013   Procedure: HERNIA REPAIR INGUINAL ADULT;  Surgeon: [name], MD;  Location: [facility];  Service: General;  Laterality: Left;  . MOUTH SURGERY    . PARTIAL COLECTOMY  2011   sigmoid  . TONSILLECTOMY    . WISDOM TOOTH EXTRACTION      Family History  Problem Relation Age of Onset  . Lung cancer Mother   . Heart disease Father     has pacemaker  . Breast cancer Sister   . Colon  cancer Neg Hx   . Colon polyps Neg Hx   . Esophageal cancer Neg Hx   . Kidney disease Neg Hx   . Diabetes Neg Hx    Social History:  reports that he has been smoking E-cigarettes and Cigarettes.  He has been smoking about 1.00 pack per day. He has never used smokeless tobacco. He reports that he does not drink alcohol or use drugs.  Allergies:  Allergies  Allergen Reactions  . Morphine And Related     dont remember the symptoms    Review of Systems - Negative except Chest pain and mild dyspnea  Blood pressure 135/95, pulse 103, temperature 98.1 F (36.7 C), temperature source Oral, resp. rate 15, height '5\' 11"'$  (1.803 m), weight 81.6 kg (180 lb), SpO2 98 %. General appearance: alert, cooperative, appears stated age and no distress Eyes: negative findings: lids and lashes normal Neck: no adenopathy, no carotid bruit, no JVD, supple, symmetrical, trachea midline and thyroid not enlarged, symmetric, no tenderness/mass/nodules Neck: JVP - normal, carotids 2+= without bruits Resp: clear to auscultation bilaterally Chest wall: no tenderness Cardio: regular rate and rhythm, S1, S2 normal, no murmur, click, rub or gallop GI: soft, non-tender; bowel sounds normal; no masses,  no organomegaly Extremities: extremities normal, atraumatic, no cyanosis or edema Pulses: 2+ and symmetric Skin: Skin color, texture, turgor normal. No rashes or  lesions Neurologic: Grossly normal  Results for orders placed or performed during the hospital encounter of 09/27/16 (from the past 48 hour(s))  Basic metabolic panel     Status: Abnormal   Collection Time: 09/27/16  9:24 PM  Result Value Ref Range   Sodium 140 135 - 145 mmol/L   Potassium 3.6 3.5 - 5.1 mmol/L   Chloride 107 101 - 111 mmol/L   CO2 23 22 - 32 mmol/L   Glucose, Bld 95 65 - 99 mg/dL   BUN 9 6 - 20 mg/dL   Creatinine, Ser 0.85 0.61 - 1.24 mg/dL   Calcium 8.7 (L) 8.9 - 10.3 mg/dL   GFR calc non Af Amer >60 >60 mL/min   GFR calc Af Amer >60  >60 mL/min    Comment: (NOTE) The eGFR has been calculated using the CKD EPI equation. This calculation has not been validated in all clinical situations. eGFR's persistently <60 mL/min signify possible Chronic Kidney Disease.    Anion gap 10 5 - 15  CBC     Status: None   Collection Time: 09/27/16  9:24 PM  Result Value Ref Range   WBC 4.6 4.0 - 10.5 K/uL   RBC 4.81 4.22 - 5.81 MIL/uL   Hemoglobin 16.1 13.0 - 17.0 g/dL   HCT 46.4 39.0 - 52.0 %   MCV 96.5 78.0 - 100.0 fL   MCH 33.5 26.0 - 34.0 pg   MCHC 34.7 30.0 - 36.0 g/dL   RDW 12.9 11.5 - 15.5 %   Platelets 170 150 - 400 K/uL  Protime-INR (order if Patient is taking Coumadin / Warfarin)     Status: None   Collection Time: 09/27/16  9:24 PM  Result Value Ref Range   Prothrombin Time 13.1 11.4 - 15.2 seconds   INR 0.99   I-stat troponin, ED     Status: None   Collection Time: 09/27/16  9:39 PM  Result Value Ref Range   Troponin i, poc 0.06 0.00 - 0.08 ng/mL   Comment 3            Comment: Due to the release kinetics of cTnI, a negative result within the first hours of the onset of symptoms does not rule out myocardial infarction with certainty. If myocardial infarction is still suspected, repeat the test at appropriate intervals.   I-Stat Chem 8, ED     Status: Abnormal   Collection Time: 09/27/16  9:41 PM  Result Value Ref Range   Sodium 143 135 - 145 mmol/L   Potassium 3.6 3.5 - 5.1 mmol/L   Chloride 104 101 - 111 mmol/L   BUN 11 6 - 20 mg/dL   Creatinine, Ser 1.10 0.61 - 1.24 mg/dL   Glucose, Bld 96 65 - 99 mg/dL   Calcium, Ion 1.11 (L) 1.15 - 1.40 mmol/L   TCO2 24 0 - 100 mmol/L   Hemoglobin 15.6 13.0 - 17.0 g/dL   HCT 46.0 39.0 - 52.0 %   Dg Chest Portable 1 View  Result Date: 09/27/2016 CLINICAL DATA:  Acute onset of generalized chest pain. Initial encounter. EXAM: PORTABLE CHEST 1 VIEW COMPARISON:  Chest radiograph performed 09/01/2015 FINDINGS: The lungs are hypoexpanded. Vascular congestion is noted.  There is no evidence of focal opacification, pleural effusion or pneumothorax. The cardiomediastinal silhouette is within normal limits. No acute osseous abnormalities are seen. IMPRESSION: Vascular congestion noted.  Lungs hypoexpanded but grossly clear. Electronically Signed   By: Garald Balding M.D.   On: 09/27/2016 21:56  Labs:   Lab Results  Component Value Date   WBC 4.6 09/27/2016   HGB 15.6 09/27/2016   HCT 46.0 09/27/2016   MCV 96.5 09/27/2016   PLT 170 09/27/2016    Recent Labs Lab 09/27/16 2124 09/27/16 2141  NA 140 143  K 3.6 3.6  CL 107 104  CO2 23  --   BUN 9 11  CREATININE 0.85 1.10  CALCIUM 8.7*  --   GLUCOSE 95 96    Lipid Panel     Component Value Date/Time   CHOL  01/24/2008 0320    100        ATP III CLASSIFICATION:  <200     mg/dL   Desirable  200-239  mg/dL   Borderline High  >=240    mg/dL   High   TRIG 77 01/24/2008 0320   HDL 32 (L) 01/24/2008 0320   CHOLHDL 3.1 01/24/2008 0320   VLDL 15 01/24/2008 0320   LDLCALC  01/24/2008 0320    53        Total Cholesterol/HDL:CHD Risk Coronary Heart Disease Risk Table                     Men   Women  1/2 Average Risk   3.4   3.3    BNP (last 3 results) No results for input(s): BNP in the last 8760 hours.  HEMOGLOBIN A1C No results found for: HGBA1C, MPG  Cardiac Panel (last 3 results) No results for input(s): CKTOTAL, CKMB, TROPONINI, RELINDX in the last 8760 hours.  Lab Results  Component Value Date   CKTOTAL 988 (H) 01/24/2008   CKMB 109.0 (H) 01/24/2008   TROPONINI (HH) 01/24/2008    33.72        POSSIBLE MYOCARDIAL ISCHEMIA. SERIAL TESTING RECOMMENDED. CRITICAL RESULT CALLED TO, READ BACK BY AND VERIFIED WITH: TOMLINSON T,RN 01/24/08 0543 WAYK     TSH No results for input(s): TSH in the last 8760 hours.  EKG: Normal sinus rhythm, ST elevation in anterolateral leads. Acute anterior STEMI.  Medications Prior to Admission  Medication Sig Dispense Refill  . lansoprazole  (PREVACID) 30 MG capsule Take 1 capsule (30 mg total) by mouth daily at 12 noon. (Patient not taking: Reported on 09/27/2016) 90 capsule 3    Current Facility-Administered Medications:  .  0.9 %  sodium chloride infusion, , , Continuous PRN, [name], MD, Last Rate: 250 mL/hr at 09/27/16 2300, 250 mL/hr at 09/27/16 2300 .  aspirin 81 MG chewable tablet, , , ,  .  bivalirudin (ANGIOMAX) 250 mg in sodium chloride 0.9 % 50 mL (5 mg/mL) infusion, , , Continuous PRN, [name], MD, Last Rate: 28.6 mL/hr at 09/27/16 2220, 1.75 mg/kg/hr at 09/27/16 2220 .  bivalirudin (ANGIOMAX) BOLUS via infusion, , , PRN, [name], MD, 61.2 mg at 09/27/16 2218 .  fentaNYL (SUBLIMAZE) injection, , , PRN, [name], MD, 50 mcg at 09/27/16 2258 .  heparin infusion 2 units/mL in 0.9 % sodium chloride, , , Continuous PRN, [name], MD, 1,000 mL at 09/27/16 2257 .  [MAR Hold] heparin injection 4,000 Units, 4,000 Units, Intravenous, STAT, Wenda Overland Little, MD .  HYDROmorphone (DILAUDID) 2 MG/ML injection, , , ,  .  iopamidol (ISOVUE-370) 76 % injection, , , PRN, [name], MD, 125 mL at 09/27/16 2301 .  lidocaine (PF) (XYLOCAINE) 1 % injection, , , PRN, [name], MD, 2 mL at 09/27/16 2215 .  Radial Cocktail (Verapamil 2.5 mg, NTG,  Lidocaine), , , PRN, [name], MD, 15 mL at 09/27/16 2215 .  ticagrelor (BRILINTA) tablet, , , PRN, [name], MD, 180 mg at 09/27/16 2232   Assessment/Plan 1. Anterolateral STEMI 2. History of coronary artery disease and prior stents in the past, details not known in 2009. Patient has not been compliant with aspirin on a statin. 3. Hypertension 4. Hyperlipidemia 5. Excessive alcohol use, no history of DTs, patient drinks approximately 250 mL of vodka every day.  Recommendation: Patient will be taken emergently to the catheterization lab and further recommendations will follow.  [name], MD 09/27/2016, 11:09 PM [facility] [place] Pager: [phone] Office:  [phone] If no answer: Cell:  [phone]

## 2016-09-27 NOTE — Progress Notes (Signed)
Chaplain made initial contact with patient, who was looking up sister's phone.  Per patient, chaplain contacted sister, Amalia Hailey, who is a PA here at [facility].  Patient requested that doctor contact sister (her number is [phone]).  Chaplain requested RN and cardiologist to convey message to Dr. Einar Gip.  Offered follow-up care to patient as needed.  Sister indicates she will not come to hospital as she contracted a GI bug in the last 24 hours.    Please call as needed.   [name], [place]

## 2016-09-27 NOTE — H&P (Signed)
NOTE ENTERED IN ERROR

## 2016-09-28 ENCOUNTER — Encounter (HOSPITAL_COMMUNITY): Payer: Self-pay

## 2016-09-28 DIAGNOSIS — I110 Hypertensive heart disease with heart failure: Secondary | ICD-10-CM

## 2016-09-28 DIAGNOSIS — I2102 ST elevation (STEMI) myocardial infarction involving left anterior descending coronary artery: Principal | ICD-10-CM

## 2016-09-28 DIAGNOSIS — Z720 Tobacco use: Secondary | ICD-10-CM

## 2016-09-28 DIAGNOSIS — I255 Ischemic cardiomyopathy: Secondary | ICD-10-CM

## 2016-09-28 LAB — BASIC METABOLIC PANEL
Anion gap: 4 — ABNORMAL LOW (ref 5–15)
BUN: 8 mg/dL (ref 6–20)
CALCIUM: 7.9 mg/dL — AB (ref 8.9–10.3)
CO2: 26 mmol/L (ref 22–32)
CREATININE: 0.74 mg/dL (ref 0.61–1.24)
Chloride: 106 mmol/L (ref 101–111)
GFR calc Af Amer: >60 mL/min (ref >60)
GFR calc non Af Amer: >60 mL/min (ref >60)
Glucose, Bld: 92 mg/dL (ref 65–99)
Potassium: 3.6 mmol/L (ref 3.5–5.1)
SODIUM: 136 mmol/L (ref 135–145)

## 2016-09-28 LAB — CBC
HEMATOCRIT: 41.1 % (ref 39.0–52.0)
Hemoglobin: 13.7 g/dL (ref 13.0–17.0)
MCH: 33.3 pg (ref 26.0–34.0)
MCHC: 33.3 g/dL (ref 30.0–36.0)
MCV: 100 fL (ref 78.0–100.0)
PLATELETS: 141 10*3/uL — AB (ref 150–400)
RBC: 4.11 MIL/uL — ABNORMAL LOW (ref 4.22–5.81)
RDW: 13.4 % (ref 11.5–15.5)
WBC: 5.7 10*3/uL (ref 4.0–10.5)

## 2016-09-28 LAB — LIPID PANEL
CHOLESTEROL: 113 mg/dL (ref 0–200)
HDL: 49 mg/dL (ref >40)
LDL Cholesterol: 27 mg/dL (ref 0–99)
Total CHOL/HDL Ratio: 2.3 RATIO
Triglycerides: 183 mg/dL — ABNORMAL HIGH (ref <150)
VLDL: 37 mg/dL (ref 0–40)

## 2016-09-28 LAB — TROPONIN I
TROPONIN I: 23.62 ng/mL — AB (ref <0.03)
TROPONIN I: 26.1 ng/mL — AB (ref <0.03)
TROPONIN I: 3.49 ng/mL — AB (ref <0.03)

## 2016-09-28 LAB — MRSA PCR SCREENING: MRSA by PCR: NEGATIVE

## 2016-09-28 MED ORDER — LISINOPRIL 5 MG PO TABS
5.0000 mg | ORAL_TABLET | Freq: Every day | ORAL | Status: DC
  Administered 2016-09-28: 5 mg via ORAL
  Filled 2016-09-28 (×2): qty 1

## 2016-09-28 MED ORDER — CARVEDILOL 6.25 MG PO TABS
6.2500 mg | ORAL_TABLET | Freq: Two times a day (BID) | ORAL | Status: DC
  Administered 2016-09-28 – 2016-09-29 (×2): 6.25 mg via ORAL
  Filled 2016-09-28 (×2): qty 1

## 2016-09-28 NOTE — Progress Notes (Signed)
Patient Name: Jeremiah Short Date of Encounter: 09/28/2016  Primary Cardiologist: [facility] Problem List     Principal Problem:   Acute ST elevation myocardial infarction (STEMI) involving left anterior descending (LAD) coronary artery (HCC)     Subjective   Feeling well currently, no chest pain, no shortness of breath.  Inpatient Medications    Scheduled Meds: . aspirin EC  81 mg Oral Daily  . atorvastatin  80 mg Oral q1800  . carvedilol  3.125 mg Oral BID WC  . heparin  4,000 Units Intravenous STAT  . sodium chloride flush  3 mL Intravenous Q12H  . sodium chloride flush  3 mL Intravenous Q12H  . ticagrelor  90 mg Oral BID   Continuous Infusions:  PRN Meds: sodium chloride, sodium chloride, nitroGLYCERIN, ondansetron (ZOFRAN) IV, sodium chloride flush, sodium chloride flush   Vital Signs    Vitals:   09/28/16 1200 09/28/16 1300 09/28/16 1400 09/28/16 1500  BP: (!) 121/91 (!) 128/101 (!) 137/97 (!) 134/95  Pulse: 81 87 96   Resp: (!) 28 (!) 22 (!) 25 (!) 31  Temp:    98.6 F (37 C)  TempSrc:      SpO2: 94% 98% 96%   Weight:      Height:        Intake/Output Summary (Last 24 hours) at 09/28/16 1625 Last data filed at 09/28/16 1400  Gross per 24 hour  Intake          1339.92 ml  Output             1975 ml  Net          -635.08 ml   Filed Weights   09/27/16 2117 09/27/16 2335  Weight: 180 lb (81.6 kg) 183 lb 10.3 oz (83.3 kg)    Physical Exam    GEN: Well nourished, well developed, in no acute distress.  HEENT: Grossly normal.  Neck: Supple, no JVD, carotid bruits, or masses. Cardiac: RRR, no murmurs, rubs, or gallops. No clubbing, cyanosis, edema.  Radials/DP/PT 2+ and equal bilaterally. Radial site clean dry and intact, right. Respiratory:  Respirations regular and unlabored, clear to auscultation bilaterally. GI: Soft, nontender, nondistended, BS + x 4. MS: no deformity or atrophy. Skin: warm and dry, no rash. Neuro:  Strength and  sensation are intact. Psych: AAOx3.  Normal affect.  Labs    CBC  Recent Labs  09/27/16 2124 09/27/16 2141 09/28/16 0528  WBC 4.6  --  5.7  HGB 16.1 15.6 13.7  HCT 46.4 46.0 41.1  MCV 96.5  --  100.0  PLT 170  --  [id number]*   Basic Metabolic Panel  Recent Labs  09/27/16 2124 09/27/16 2141 09/28/16 0528  NA 140 143 136  K 3.6 3.6 3.6  CL 107 104 106  CO2 23  --  26  GLUCOSE 95 96 92  BUN 9 11 8   CREATININE 0.85 1.10 0.74  CALCIUM 8.7*  --  7.9*   Liver Function Tests No results for input(s): AST, ALT, ALKPHOS, BILITOT, PROT, ALBUMIN in the last 72 hours. No results for input(s): LIPASE, AMYLASE in the last 72 hours. Cardiac Enzymes  Recent Labs  09/27/16 2357 09/28/16 0528 09/28/16 1127  TROPONINI 3.49* 23.62* 26.10*   BNP Invalid input(s): POCBNP D-Dimer No results for input(s): DDIMER in the last 72 hours. Hemoglobin A1C No results for input(s): HGBA1C in the last 72 hours. Fasting Lipid Panel  Recent Labs  09/28/16 0537  CHOL 113  HDL 49  LDLCALC 27  TRIG 183*  CHOLHDL 2.3   Thyroid Function Tests No results for input(s): TSH, T4TOTAL, T3FREE, THYROIDAB in the last 72 hours.  Invalid input(s): FREET3  Telemetry    No ventricular tachycardia, normal sinus rhythm - Personally Reviewed  ECG    Original EKG with anterolateral ST segment elevation, sinus rhythm. - Personally Reviewed  Radiology    Dg Chest Portable 1 View  Result Date: 2016/10/01 CLINICAL DATA:  Acute onset of generalized chest pain. Initial encounter. EXAM: PORTABLE CHEST 1 VIEW COMPARISON:  Chest radiograph performed 09/01/2015 FINDINGS: The lungs are hypoexpanded. Vascular congestion is noted. There is no evidence of focal opacification, pleural effusion or pneumothorax. The cardiomediastinal silhouette is within normal limits. No acute osseous abnormalities are seen. IMPRESSION: Vascular congestion noted.  Lungs hypoexpanded but grossly clear. Electronically Signed   By:  Garald Balding M.D.   On: 2016/10/01 21:56    Cardiac Studies       Coronary angiogram Oct 01, 2016: LVEF 40% with mid to distal anterior apical and inferoapical akinesis. Mild disease RCA and circumflex. Mid LAD occluded just proximal to the previously placed stent, S/P 3.0 x 22 mm resolute onyx DES, postdilated with 3.5 x 12 [place] at 16 atmospheric, 100% reduced to 0%, D2 stent jailed, balloon antroplasty with 2.0 x 12 mm balloon, 99% reduced to less than 10%, TIMI-3 to TIMI-3 in both vessels.  Recommendation: Patient will need dual antiplatelet therapy for at least one year or longer. Needs compliance reiteration. Smoking cessation already discussed with the patient. Moderation in alcohol use discussed.     Patient Profile     55 year old male with anterolateral ST elevation myocardial infarction, EF 40%  Assessment & Plan    Anterolateral ST elevation myocardial infarction  - Aspirin, Brilinta, high-intensity statin  - Smoking cessation  - Worried about cost of Brilinta, social work assistance appreciated.  - Okay to transfer to telemetry  - Cardiac rehabilitation  Ischemic cardiomyopathy, EF 40%  - I will increase carvedilol to 6.25 mg twice a day. Blood pressure can tolerate.  - I will start lisinopril 5 mg once a day  - Currently does not appear volume overloaded. Hopefully ejection fraction will improve with time.  Tobacco use  - Tobacco cessation discussed.  Hypertensive heart disease with heart failure  - Beta blocker, ACE inhibitor. Appears euvolemic  We may be able to discharge later tomorrow evening if stable. Discussed with him. He is eager to get home.  Signed, Candee Furbish, MD  09/28/2016, 4:25 PM

## 2016-09-28 NOTE — Progress Notes (Signed)
Pt made aware of being transferred to new unit 3W. Pt did not want RN to call any family to make aware. He states he will call them. Pt belongings packed including clothes, phone charger, cell phone and tablet to go with pt to new room 3w27. Report called and given to [facility].

## 2016-09-28 NOTE — Progress Notes (Addendum)
CARDIAC REHAB PHASE I   PRE:  Rate/Rhythm: 84 SR  BP:  Sitting: 133/95        SaO2: 95 RA  MODE:  Ambulation: 470 ft   POST:  Rate/Rhythm: 104 ST  BP:  Sitting: 144/108, 133/96 after 10 minutes rest         SaO2: 96 RA  Pt ambulated 470 ft on RA, handheld assist, steady gait, tolerated well.  Pt c/o mild DOE, denies cp, dizziness, declined rest stop. Completed MI/stent education.  Reviewed risk factors, tobacco cessation (pt declined fake cigarette), ETOH cessation, MI book, anti-platelet therapy, stent card, activity restrictions, ntg, exercise, heart healthy diet, and phase 2 cardiac rehab. Pt verbalized understanding, receptive to education. Pt agrees to phase 2 cardiac rehab referral, will send to [facility] per pt request. Pt to see case manager regarding brilinta co-pay prior to discharge. Pt to bed per pt request after walk, call bell within reach. Will follow-up tomorrow.   [place], RN, BSN 09/28/2016 2:23 PM

## 2016-09-28 NOTE — ED Provider Notes (Signed)
[facility] DEPT Provider Note   CSN: [id number] Arrival date & time: 09/27/16 2112     History    Chief Complaint  Patient presents with  . Chest Pain    STEMI     HPI Jeremiah Short is a 55 y.o. male.  55yo M w/ PMH including CAD s/p MI, HTN, HLD Who presents with chest pain. At approximately 7 PM this evening, the patient was awakened by central, severe, dull chest pain radiating down both arms. He waited a while to see if the pain would improve but it did not. He reports associated nausea, shortness of breath, and diaphoresis. He has had this pain previously the last time he had a heart attack. He states that he does not take any of his medications currently.   Past Medical History:  Diagnosis Date  . Anxiety   . Cardiomyopathy in other diseases classified elsewhere   . Coronary artery disease   . Depression   . Diverticulosis   . Esophageal stricture   . GERD (gastroesophageal reflux disease)   . History of ETOH abuse   . Hypertension   . Myocardial infarction 2009  . Pure hypercholesterolemia   . Smoker   . Stented coronary artery   . Tubular adenoma of colon 2012  . Wears glasses      Patient Active Problem List   Diagnosis Date Noted  . Acute ST elevation myocardial infarction (STEMI) involving left anterior descending (LAD) coronary artery ([place]) 09/27/2016  . Coronary artery disease   . Hypertension   . Myocardial infarction   . Pure hypercholesterolemia   . Cardiomyopathy in other diseases classified elsewhere   . Anxiety   . Post-operative state 10/06/2013  . Left inguinal hernia 05/08/2013    Past Surgical History:  Procedure Laterality Date  . APPENDECTOMY  2011   during colectomy  . CARDIAC CATHETERIZATION  2009   placed 2 stents  . CORONARY STENT PLACEMENT    . HERNIA REPAIR    . INGUINAL HERNIA REPAIR Left 09/16/2013   Procedure: HERNIA REPAIR INGUINAL ADULT;  Surgeon: [name], MD;  Location: [facility];   Service: General;  Laterality: Left;  . MOUTH SURGERY    . PARTIAL COLECTOMY  2011   sigmoid  . TONSILLECTOMY    . WISDOM TOOTH EXTRACTION          Home Medications    Prior to Admission medications   Medication Sig Start Date End Date Taking? Authorizing Provider  lansoprazole (PREVACID) 30 MG capsule Take 1 capsule (30 mg total) by mouth daily at 12 noon. Patient not taking: Reported on 09/27/2016 08/20/14   Ladene Artist, MD      Family History  Problem Relation Age of Onset  . Lung cancer Mother   . Heart disease Father     has pacemaker  . Breast cancer Sister   . Colon cancer Neg Hx   . Colon polyps Neg Hx   . Esophageal cancer Neg Hx   . Kidney disease Neg Hx   . Diabetes Neg Hx      Social History  Substance Use Topics  . Smoking status: Current Every Day Smoker    Packs/day: 0.75    Years: 40.00    Types: E-cigarettes, Cigarettes  . Smokeless tobacco: Never Used     Comment: Pt given handout to quit smoking  . Alcohol use 0.0 oz/week     Comment: 1/5 vodka every 3 days  Allergies     Morphine and related    Review of Systems  10 Systems reviewed and are negative for acute change except as noted in the HPI.   Physical Exam Updated Vital Signs BP 101/70   Pulse 82   Temp 98.1 F (36.7 C) (Oral)   Resp (!) 25   Ht 5\' 11"  (1.803 m)   Wt 183 lb 10.3 oz (83.3 kg)   SpO2 (!) 0%   BMI 25.61 kg/m   Physical Exam  Constitutional: He is oriented to person, place, and time. He appears well-developed and well-nourished. No distress.  uncomfortable  HENT:  Head: Normocephalic and atraumatic.  Moist mucous membranes  Eyes: Conjunctivae are normal. Pupils are equal, round, and reactive to light.  Neck: Neck supple.  Cardiovascular: Normal rate, regular rhythm and normal heart sounds.   No murmur heard. Pulmonary/Chest: Effort normal and breath sounds normal.  Abdominal: Soft. Bowel sounds are normal. He exhibits no distension. There  is no tenderness.  Musculoskeletal: He exhibits no edema.  Neurological: He is alert and oriented to person, place, and time.  Fluent speech  Skin: Skin is warm. He is diaphoretic.  Psychiatric: He has a normal mood and affect. Judgment normal.  Nursing note and vitals reviewed.     ED Treatments / Results  Labs (all labs ordered are listed, but only abnormal results are displayed) Labs Reviewed  BASIC METABOLIC PANEL - Abnormal; Notable for the following:       Result Value   Calcium 8.7 (*)    All other components within normal limits  I-STAT CHEM 8, ED - Abnormal; Notable for the following:    Calcium, Ion 1.11 (*)    All other components within normal limits  MRSA PCR SCREENING  CBC  PROTIME-INR  TROPONIN I  TROPONIN I  TROPONIN I  BASIC METABOLIC PANEL  LIPID PANEL  CBC  I-STAT TROPOININ, ED     EKG  EKG Interpretation  Date/Time:  Wednesday September 27 2016 21:27:12 EST Ventricular Rate:  95 PR Interval:  140 QRS Duration: 94 QT Interval:  330 QTC Calculation: 415 R Axis:   -28 Text Interpretation:  Sinus rhythm Inferior infarct, old Probable anterolateral infarct, acute ** ** ACUTE MI / STEMI ** ** Confirmed by LITTLE MD, RACHEL [phone]) on 09/27/2016 9:35:01 PM         Radiology Dg Chest Portable 1 View  Result Date: 09/27/2016 CLINICAL DATA:  Acute onset of generalized chest pain. Initial encounter. EXAM: PORTABLE CHEST 1 VIEW COMPARISON:  Chest radiograph performed 09/01/2015 FINDINGS: The lungs are hypoexpanded. Vascular congestion is noted. There is no evidence of focal opacification, pleural effusion or pneumothorax. The cardiomediastinal silhouette is within normal limits. No acute osseous abnormalities are seen. IMPRESSION: Vascular congestion noted.  Lungs hypoexpanded but grossly clear. Electronically Signed   By: Garald Balding M.D.   On: 09/27/2016 21:56    Procedures Procedures (including critical care time) .Critical Care Performed by:  Sharlett Iles Authorized by: Sharlett Iles   Critical care provider statement:    Critical care time (minutes):  35   Critical care time was exclusive of:  Separately billable procedures and treating other patients   Critical care was necessary to treat or prevent imminent or life-threatening deterioration of the following conditions:  Cardiac failure   Critical care was time spent personally by me on the following activities:  Development of treatment plan with patient or surrogate, discussions with consultants, examination of patient, obtaining  history from patient or surrogate, ordering and performing treatments and interventions, ordering and review of laboratory studies, ordering and review of radiographic studies, re-evaluation of patient's condition and review of old charts    Medications Ordered in ED  Medications  aspirin 81 MG chewable tablet (  Not Given 09/27/16 2132)  heparin injection 4,000 Units ( Intravenous MAR Unhold 09/27/16 2328)  HYDROmorphone (DILAUDID) 2 MG/ML injection (not administered)  aspirin EC tablet 81 mg (not administered)  nitroGLYCERIN (NITROSTAT) SL tablet 0.4 mg (not administered)  ondansetron (ZOFRAN) injection 4 mg (not administered)  sodium chloride flush (NS) 0.9 % injection 3 mL (not administered)  sodium chloride flush (NS) 0.9 % injection 3 mL (not administered)  0.9 %  sodium chloride infusion (not administered)  labetalol (NORMODYNE,TRANDATE) injection 10 mg (not administered)  hydrALAZINE (APRESOLINE) injection 5 mg (not administered)  sodium chloride flush (NS) 0.9 % injection 3 mL (not administered)  sodium chloride flush (NS) 0.9 % injection 3 mL (not administered)  0.9 %  sodium chloride infusion (not administered)  ticagrelor (BRILINTA) tablet 90 mg (not administered)  carvedilol (COREG) tablet 3.125 mg (not administered)  atorvastatin (LIPITOR) tablet 80 mg (not administered)  0.9% sodium chloride infusion (1 mL/kg/hr   81.6 kg Intravenous Transfusing/Transfer 09/27/16 2345)  heparin 5000 UNIT/ML injection (4,000 Units  Given 09/27/16 2132)  aspirin chewable tablet 324 mg (324 mg Oral Given 09/27/16 2130)  nitroGLYCERIN (NITROGLYN) 2 % ointment 1 inch (1 inch Topical Given 09/27/16 2130)  HYDROmorphone (DILAUDID) injection 0.5 mg (0.5 mg Intravenous Given 09/27/16 2158)     Initial Impression / Assessment and Plan / ED Course  I have reviewed the triage vital signs and the nursing notes.  Pertinent labs & imaging results that were available during my care of the patient were reviewed by me and considered in my medical decision making (see chart for details).  Clinical Course     A sugar with known CAD presents with chest pain beginning this evening. EKG obtained in triage was concerning for STEMI w/ ST elevation in V2-V4; pt immediately brought back to trauma bay and code STEMI activated. Given aspirin, NTG, heparin bolus. Sent above labwork. I discussed with STEMI cardiologist, Dr. Einar Gip, appreciate his assistance. He took patient to cath lab emergently for further care.  Final Clinical Impressions(s) / ED Diagnoses   Final diagnoses:  None     Current Discharge Medication List         Sharlett Iles, MD 09/28/16 [phone]

## 2016-09-29 DIAGNOSIS — I2109 ST elevation (STEMI) myocardial infarction involving other coronary artery of anterior wall: Secondary | ICD-10-CM

## 2016-09-29 LAB — POCT ACTIVATED CLOTTING TIME: Activated Clotting Time: 450 seconds

## 2016-09-29 MED ORDER — NITROGLYCERIN 0.4 MG SL SUBL: 0.4000 mg | SUBLINGUAL_TABLET | SUBLINGUAL | 2 refills | Status: DC | PRN

## 2016-09-29 MED ORDER — TICAGRELOR 90 MG PO TABS: 90.0000 mg | ORAL_TABLET | Freq: Two times a day (BID) | ORAL | 3 refills | Status: DC

## 2016-09-29 MED ORDER — ATORVASTATIN CALCIUM 80 MG PO TABS: 80.0000 mg | ORAL_TABLET | Freq: Every day | ORAL | 3 refills | Status: DC

## 2016-09-29 MED ORDER — CARVEDILOL 3.125 MG PO TABS: 3.1250 mg | ORAL_TABLET | Freq: Two times a day (BID) | ORAL | 3 refills | Status: DC

## 2016-09-29 MED ORDER — CARVEDILOL 3.125 MG PO TABS
3.1250 mg | ORAL_TABLET | Freq: Two times a day (BID) | ORAL | Status: DC
Start: 2016-09-29 — End: 2016-09-29

## 2016-09-29 MED ORDER — ASPIRIN 81 MG PO TBEC: 81.0000 mg | DELAYED_RELEASE_TABLET | Freq: Every day | ORAL | Status: AC

## 2016-09-29 NOTE — Discharge Summary (Signed)
Patient ID: Jeremiah Short,  MRN: [id number], DOB/AGE: Dec 22, 1960 55 y.o.  Admit date: 09/27/2016 Discharge date: 09/29/2016  Primary Care Provider: Melinda Crutch, MD Primary Cardiologist: Dr [name]  Discharge Diagnoses Principal Problem:   Acute ST elevation myocardial infarction (STEMI) involving left anterior descending (LAD) coronary artery [facility])    Procedures: Urgent cath/PCI 09/27/16   Hospital Course: 55 y.o. male with a history of CAD,  S/p  STEMI in 01/23/2008 treated with PCI/ stenting, HTN, HLD, and a smoker, admitted 09/27/16 with a STEMI. The pt had been seen by Dr [name] to re establish cardiology care in Dec 2016. He had stooped some of his medications on his own. He presented 09/27/16 with anterior ST elevation and was taken to the cath lab by Dr Einar Gip who was on call for STEMIs. Cath revealed mLAD occlusion just proximal to the previously placed LAD stent. He underwent sucssesful PCI with DES. EF was 40% at cath. Post op he did well. He had low B/Ps and could not tolerate an ACE. We will try and initiate this as an OP. Dr [name] feels he can be discharged 09/29/16. A 7 day TOC f/u will be arranged.  He has been instructed not to drive, return to work, or have sex till we see him in the office.   Discharge Vitals:  Blood pressure 92/76, pulse 79, temperature 97.7 F (36.5 C), temperature source Oral, resp. rate 18, height 5\' 11"  (1.803 m), weight 185 lb (83.9 kg), SpO2 97 %.    Labs: No results found for this or any previous visit (from the past 24 hour(s)).  Disposition:  Follow-up Information    Candee Furbish, MD Follow up today.   Specialty:  Cardiology Why:  Office will contact you with an appointment Contact information: [id number] [place] [address] [phone]           Discharge Medications:  Allergies as of 09/29/2016      Reactions   Morphine And Related    dont remember the symptoms      Medication List    STOP  taking these medications   lansoprazole 30 MG capsule Commonly known as:  PREVACID     TAKE these medications   aspirin 81 MG EC tablet Take 1 tablet (81 mg total) by mouth daily. Start taking on:  09/30/2016   atorvastatin 80 MG tablet Commonly known as:  LIPITOR Take 1 tablet (80 mg total) by mouth daily at 6 PM.   carvedilol 3.125 MG tablet Commonly known as:  COREG Take 1 tablet (3.125 mg total) by mouth 2 (two) times daily with a meal.   nitroGLYCERIN 0.4 MG SL tablet Commonly known as:  NITROSTAT Place 1 tablet (0.4 mg total) under the tongue every 5 (five) minutes x 3 doses as needed for chest pain.   ticagrelor 90 MG Tabs tablet Commonly known as:  BRILINTA Take 1 tablet (90 mg total) by mouth 2 (two) times daily.        Duration of Discharge Encounter: Greater than 30 minutes including physician time.  Signed, Kerin Ransom PA-C 09/29/2016   Personally seen and examined. Agree with above.  Primary Cardiologist: [facility] Problem List     Principal Problem:   Acute ST elevation myocardial infarction (STEMI) involving left anterior descending (LAD) coronary artery ([facility])     Subjective   Feeling well currently, no chest pain, no shortness of breath.Walking hallway area comfortable.  Inpatient Medications  Scheduled Meds: . aspirin EC  81 mg Oral Daily  . atorvastatin  80 mg Oral q1800  . carvedilol  6.25 mg Oral BID WC  . lisinopril  5 mg Oral Daily  . sodium chloride flush  3 mL Intravenous Q12H  . sodium chloride flush  3 mL Intravenous Q12H  . ticagrelor  90 mg Oral BID   Continuous Infusions:  PRN Meds: sodium chloride, sodium chloride, nitroGLYCERIN, ondansetron (ZOFRAN) IV, sodium chloride flush, sodium chloride flush   Vital Signs          Vitals:   09/28/16 1850 09/28/16 2128 09/29/16 0541 09/29/16 0749  BP: 115/90 110/84 94/75 92/76   Pulse: (!) 101 81 72 79  Resp: (!) 24 20 18    Temp: 97.7 F (36.5 C) 97.6 F  (36.4 C) 97.7 F (36.5 C)   TempSrc: Oral Oral Oral   SpO2: 96% 93% 97%   Weight:   185 lb (83.9 kg)   Height:   5\' 11"  (1.803 m)     Intake/Output Summary (Last 24 hours) at 09/29/16 1156 Last data filed at 09/29/16 0931  Gross per 24 hour  Intake             1060 ml  Output              750 ml  Net              310 ml        Filed Weights   09/27/16 2117 09/27/16 2335 09/29/16 0541  Weight: 180 lb (81.6 kg) 183 lb 10.3 oz (83.3 kg) 185 lb (83.9 kg)    Physical Exam    GEN: Well nourished, well developed, in no acute distress.  HEENT: Grossly normal.  Neck: Supple, no JVD, carotid bruits, or masses. Cardiac: RRR, no murmurs, rubs, or gallops. No clubbing, cyanosis, edema.  Radials/DP/PT 2+ and equal bilaterally. Radial site clean dry and intact, right. Respiratory:  Respirations regular and unlabored, clear to auscultation bilaterally. GI: Soft, nontender, nondistended, BS + x 4. MS: no deformity or atrophy. Skin: warm and dry, no rash. Neuro:  Strength and sensation are intact. Psych: AAOx3.  Normal affect.  Labs    CBC  Recent Labs (last 2 labs)    Recent Labs  09/27/16 2124 09/27/16 2141 09/28/16 0528  WBC 4.6  --  5.7  HGB 16.1 15.6 13.7  HCT 46.4 46.0 41.1  MCV 96.5  --  100.0  PLT 170  --  141*     Basic Metabolic Panel  Recent Labs (last 2 labs)    Recent Labs  09/27/16 2124 09/27/16 2141 09/28/16 0528  NA 140 143 136  K 3.6 3.6 3.6  CL 107 104 106  CO2 23  --  26  GLUCOSE 95 96 92  BUN 9 11 8   CREATININE 0.85 1.10 0.74  CALCIUM 8.7*  --  7.9*     Liver Function Tests Recent Labs (last 2 labs)   No results for input(s): AST, ALT, ALKPHOS, BILITOT, PROT, ALBUMIN in the last 72 hours.   Recent Labs (last 2 labs)   No results for input(s): LIPASE, AMYLASE in the last 72 hours.   Cardiac Enzymes  Recent Labs (last 2 labs)    Recent Labs  09/27/16 2357 09/28/16 0528 09/28/16 1127  TROPONINI 3.49*  23.62* 26.10*     BNP Recent Labs (last 2 labs)   Invalid input(s): POCBNP   D-Dimer Recent Labs (last 2 labs)  No results for input(s): DDIMER in the last 72 hours.   Hemoglobin A1C Recent Labs (last 2 labs)   No results for input(s): HGBA1C in the last 72 hours.   Fasting Lipid Panel  Recent Labs (last 2 labs)    Recent Labs  09/28/16 0537  CHOL 113  HDL 49  LDLCALC 27  TRIG 183*  CHOLHDL 2.3     Thyroid Function Tests  Recent Labs (last 2 labs)   No results for input(s): TSH, T4TOTAL, T3FREE, THYROIDAB in the last 72 hours.  Invalid input(s): FREET3    Telemetry    No ventricular tachycardia, normal sinus rhythm - Personally Reviewed  ECG    Original EKG with anterolateral ST segment elevation, sinus rhythm. - Personally Reviewed  Radiology     Imaging Results (Last 48 hours)  Dg Chest Portable 1 View  Result Date: October 15, 2016 CLINICAL DATA:  Acute onset of generalized chest pain. Initial encounter. EXAM: PORTABLE CHEST 1 VIEW COMPARISON:  Chest radiograph performed 09/01/2015 FINDINGS: The lungs are hypoexpanded. Vascular congestion is noted. There is no evidence of focal opacification, pleural effusion or pneumothorax. The cardiomediastinal silhouette is within normal limits. No acute osseous abnormalities are seen. IMPRESSION: Vascular congestion noted.  Lungs hypoexpanded but grossly clear. Electronically Signed   By: Garald Balding M.D.   On: 10-15-2016 21:56     Cardiac Studies       Coronary angiogram 10/15/16: LVEF 40% with mid to distal anterior apical and inferoapical akinesis. Mild disease RCA and circumflex. Mid LAD occluded just proximal to the previously placed stent, S/P 3.0 x 22 mm resolute onyx DES, postdilated with 3.5 x 12 [place] at 16 atmospheric, 100% reduced to 0%, D2 stent jailed, balloon antroplasty with 2.0 x 12 mm balloon, 99% reduced to less than 10%, TIMI-3 to TIMI-3 in both vessels.  Recommendation: Patient will  need dual antiplatelet therapy for at least one year or longer. Needs compliance reiteration. Smoking cessation already discussed with the patient. Moderation in alcohol use discussed.     Patient Profile     55 year old male with anterolateral ST elevation myocardial infarction, EF 40%  Assessment & Plan    Anterolateral ST elevation myocardial infarction  - Aspirin, Brilinta, high-intensity statin  - Smoking cessation  - Worried about cost of Brilinta, social work assistance appreciated.  - Okay to transfer to telemetry  - Cardiac rehabilitation  Ischemic cardiomyopathy, EF 40%  - I will Decrease carvedilol from 6.25 mg twice a day 3.125 mg twice a day as he was originally taking yesterday. Blood pressure could not tolerate.  - I tried to start lisinopril 5 mg once a day but blood pressure is 92/76. We will hold off at this time and try to initiate as outpatient.  - Currently does not appear volume overloaded. Hopefully ejection fraction will improve with time.  Tobacco use  - Tobacco cessation discussed.  Essential hypertension/hypotension this morning  - Beta blocker started here, ACE inhibitor and we will start his outpatient. Appears euvolemic. Ambulating well.  Discharge today. Discussed with him. He is eager to get home.  Signed, Candee Furbish, MD

## 2016-09-29 NOTE — Discharge Instructions (Signed)
Heart Attack A heart attack (myocardial infarction, MI) causes damage to your heart that cannot be fixed. A heart attack can happen when a heart (coronary) artery becomes blocked or narrowed. This cuts off the blood supply and oxygen to your heart. When one or more of your coronary arteries becomes blocked, that area of your heart begins to die. This causes the pain you feel during a heart attack. Heart attack pain can also occur in one part of the body but be felt in another part of the body (referred pain). You may feel referred heart attack pain in your left arm, neck, or jaw. Pain may even be felt in the right arm. What are the causes? Many conditions can cause a heart attack. These include:  Atherosclerosis. This is when a fatty substance (plaque) gradually builds up in the arteries. This buildup can block or reduce the blood supply to one or more of the heart arteries.  A blood clot. A blood clot can develop suddenly when plaque breaks up (ruptures) within a heart artery. A blood clot can block the heart artery, which prevents blood flow to the heart.  Severe tightening (spasm) of the heart artery. This cuts off blood flow through the artery. What increases the risk?   People at risk for heart attack usually have one or more of the following risk factors:  High blood pressure (hypertension).  High cholesterol.  Smoking.  Being male.  Being overweight or obese.  Older aged.  A family history of heart disease.  Lack of exercise.  Diabetes.  Stress.  Drinking too much alcohol.  Using illegal street drugs, such as cocaine and methamphetamines. What are the signs or symptoms? Heart attack symptoms can vary from person to person. Symptoms depend on factors like gender and age.  In both men and women, heart attack symptoms can include the following:  Chest pain. This may feel like crushing, squeezing, or a feeling of pressure.  Shortness of breath.  Heartburn or  indigestion with or without vomiting, shortness of breath, or sweating.  Sudden cold sweats.  Sudden light-headedness.  Upper back pain.  Women can have unique heart attack symptoms, such as:  Unexplained feelings of nervousness or anxiety.  Discomfort between the shoulder blades or upper back.  Tingling in the hands and arms.  Older people (of both genders) can have subtle heart attack symptoms, such as:  Sweating.  Shortness of breath.  General tiredness or not feeling well. How is this diagnosed? Diagnosing a heart attack involves several tests. They include:  An assessment of your vital signs. This includes checking your:  Heart rhythm.  Blood pressure.  Breathing rate.  Oxygen level.  An ECG (electrocardiogram) to measure the electrical activity of your heart.  Blood tests called cardiac markers. In these tests, blood is drawn at scheduled times to check for the specific proteins or enzymes released by damaged heart muscle.  A chest X-ray.  An echocardiogram to evaluate heart motion and blood flow.  Coronary angiography to look at the heart arteries. How is this treated? Treatment for a heart attack may include:  Medicine that breaks up or dissolves blood clots in the heart artery.  Angioplasty.  Cardiac stent placement.  Intra-aortic balloon pump therapy (IABP).  Open heart surgery (coronary artery bypass graft, CABG). Follow these instructions at home:  Take medicines only as directed by your health care provider. You may need to take medicine after a heart attack to:  Keep your blood from clotting  too easily.  Control your blood pressure.  Lower your cholesterol.  Control abnormal heart rhythms.  Do not take the following medicines unless your health care provider approves:  Nonsteroidal anti-inflammatory drugs (NSAIDs), such as ibuprofen, naproxen, or celecoxib.  Vitamin supplements that contain vitamin A, vitamin E, or  both.  Hormone replacement therapy that contains estrogen with or without progestin.  Make lifestyle changes as directed by your health care provider. These may include:  Quitting smoking, if you smoke.  Getting regular exercise. Ask your health care provider to suggest some activities that are safe for you.  Eating a heart-healthy diet. A registered dietitian can help you learn healthy eating options.  Maintaining a healthy weight.  Managing diabetes, if necessary.  Reducing stress.  Limiting how much alcohol you drink. Get help right away if:  You have sudden, unexplained chest discomfort.  You have sudden, unexplained discomfort in your arms, back, neck, or jaw.  You have shortness of breath at any time.  You suddenly start to sweat or your skin gets clammy.  You feel nauseous or vomit.  You suddenly feel light-headed or dizzy.  Your heart begins to beat fast or feels like it is skipping beats. These symptoms may represent a serious problem that is an emergency. Do not wait to see if the symptoms will go away. Get medical help right away. Call your local emergency services (911 in the [place] Do not drive yourself to the hospital.  This information is not intended to replace advice given to you by your health care provider. Make sure you discuss any questions you have with your health care provider. Document Released: 09/18/2005 Document Revised: 02/24/2016 Document Reviewed: 11/21/2013 Elsevier Interactive Patient Education  2017 [employer]. Coronary Angiogram With Stent, Care After This sheet gives you information about how to care for yourself after your procedure. Your health care provider may also give you more specific instructions. If you have problems or questions, contact your health care provider. What can I expect after the procedure? After your procedure, it is common to have:  Bruising in the area where a small, thin tube (catheter) was inserted. This  usually fades within 1-2 weeks.  Blood collecting in the tissue (hematoma) that may be painful to the touch. It should usually decrease in size and tenderness within 1-2 weeks. Follow these instructions at home: Insertion area care  Do not take baths, swim, or use a hot tub until your health care provider approves.  You may shower 24-48 hours after the procedure or as directed by your health care provider.  Follow instructions from your health care provider about how to take care of your incision. Make sure you:  Wash your hands with soap and water before you change your bandage (dressing). If soap and water are not available, use hand sanitizer.  Change your dressing as told by your health care provider.  Leave stitches (sutures), skin glue, or adhesive strips in place. These skin closures may need to stay in place for 2 weeks or longer. If adhesive strip edges start to loosen and curl up, you may trim the loose edges. Do not remove adhesive strips completely unless your health care provider tells you to do that.  Remove the bandage (dressing) and gently wash the catheter insertion site with plain soap and water.  Pat the area dry with a clean towel. Do not rub the area, because that may cause bleeding.  Do not apply powder or lotion to the incision area.  Check your incision area every day for signs of infection. Check for:  More redness, swelling, or pain.  More fluid or blood.  Warmth.  Pus or a bad smell. Activity  Do not drive for 24 hours if you were given a medicine to help you relax (sedative).  Do not lift anything that is heavier than 10 lb (4.5 kg) for 5 days after your procedure or as directed by your health care provider.  Ask your health care provider when it is okay for you:  To return to work or school.  To resume usual physical activities or sports.  To resume sexual activity. Eating and drinking  Eat a heart-healthy diet. This should include plenty  of fresh fruits and vegetables.  Avoid the following types of food:  Food that is high in salt.  Canned or highly processed food.  Food that is high in saturated fat or sugar.  Fried food.  Limit alcohol intake to no more than 1 drink a day for non-pregnant women and 2 drinks a day for men. One drink equals 12 oz of beer, 5 oz of wine, or 1 oz of hard liquor. Lifestyle  Do not use any products that contain nicotine or tobacco, such as cigarettes and e-cigarettes. If you need help quitting, ask your health care provider.  Take steps to manage and control your weight.  Get regular exercise.  Manage your blood pressure.  Manage other health problems, such as diabetes. General instructions  Take over-the-counter and prescription medicines only as told by your health care provider. Blood thinners may be prescribed after your procedure to improve blood flow through the stent.  If you need an MRI after your heart stent has been placed, be sure to tell the health care provider who orders the MRI that you have a heart stent.  Keep all follow-up visits as directed by your health care provider. This is important. Contact a health care provider if:  You have a fever.  You have chills.  You have increased bleeding from the catheter insertion area. Hold pressure on the area. Get help right away if:  You develop chest pain or shortness of breath.  You feel faint or you pass out.  You have unusual pain at the catheter insertion area.  You have redness, warmth, or swelling at the catheter insertion area.  You have drainage (other than a small amount of blood on the dressing) from the catheter insertion area.  The catheter insertion area is bleeding, and the bleeding does not stop after 30 minutes of holding steady pressure on the area.  You develop bleeding from any other place, such as from your rectum. There may be bright red blood in your urine or stool, or it may appear as  black, tarry stool. This information is not intended to replace advice given to you by your health care provider. Make sure you discuss any questions you have with your health care provider. Document Released: 04/07/2005 Document Revised: 06/15/2016 Document Reviewed: 06/15/2016 Elsevier Interactive Patient Education  2017 [employer].

## 2016-09-29 NOTE — Care Management Note (Addendum)
Case Management Note  Patient Details  Name: Herbert Tibbetts MRN: [id number] Date of Birth: [birth year]  Subjective/Objective:    S/p  Stent intervention, will be on brilinta, NCM gave patient the 30 days savings card, he will [facility] [address] to get his 30 days free, they do have in stock.  He also states when Jan begins he will have to reach his deductible again which is 2000.00 , NCM informed him that he should not go without this medication , his cardiologist has samples in the office and if he finds his co pay after Jan to be too high to let cardiologist know , maybe they can switch him to something else.  NCM awaiting benefit check.  NCM spoke with MD regarding this, he wants patient to cont with the 30 day free and they will switch him to something else.  MD went to speak with patient also.               Action/Plan:   Expected Discharge Date:                  Expected Discharge Plan:  Home/Self Care  In-House Referral:     Discharge planning Services  CM Consult  Post Acute Care Choice:    Choice offered to:     DME Arranged:    DME Agency:     HH Arranged:    HH Agency:     Status of Service:  Completed, signed off  If discussed at [employer] of Stay Meetings, dates discussed:    Additional Comments:  Zenon Mayo, RN 09/29/2016, 11:12 AM

## 2016-09-29 NOTE — Progress Notes (Addendum)
Patient Name: Jeremiah Short Date of Encounter: 09/29/2016  Primary Cardiologist: [facility] Problem List     Principal Problem:   Acute ST elevation myocardial infarction (STEMI) involving left anterior descending (LAD) coronary artery (HCC)     Subjective   Feeling well currently, no chest pain, no shortness of breath.Walking hallway area comfortable.  Inpatient Medications    Scheduled Meds: . aspirin EC  81 mg Oral Daily  . atorvastatin  80 mg Oral q1800  . carvedilol  6.25 mg Oral BID WC  . lisinopril  5 mg Oral Daily  . sodium chloride flush  3 mL Intravenous Q12H  . sodium chloride flush  3 mL Intravenous Q12H  . ticagrelor  90 mg Oral BID   Continuous Infusions:  PRN Meds: sodium chloride, sodium chloride, nitroGLYCERIN, ondansetron (ZOFRAN) IV, sodium chloride flush, sodium chloride flush   Vital Signs    Vitals:   09/28/16 1850 09/28/16 2128 09/29/16 0541 09/29/16 0749  BP: 115/90 110/84 94/75 92/76   Pulse: (!) 101 81 72 79  Resp: (!) 24 20 18    Temp: 97.7 F (36.5 C) 97.6 F (36.4 C) 97.7 F (36.5 C)   TempSrc: Oral Oral Oral   SpO2: 96% 93% 97%   Weight:   185 lb (83.9 kg)   Height:   5\' 11"  (1.803 m)     Intake/Output Summary (Last 24 hours) at 09/29/16 1156 Last data filed at 09/29/16 0931  Gross per 24 hour  Intake             1060 ml  Output              750 ml  Net              310 ml   Filed Weights   09/27/16 2117 09/27/16 2335 09/29/16 0541  Weight: 180 lb (81.6 kg) 183 lb 10.3 oz (83.3 kg) 185 lb (83.9 kg)    Physical Exam    GEN: Well nourished, well developed, in no acute distress.  HEENT: Grossly normal.  Neck: Supple, no JVD, carotid bruits, or masses. Cardiac: RRR, no murmurs, rubs, or gallops. No clubbing, cyanosis, edema.  Radials/DP/PT 2+ and equal bilaterally. Radial site clean dry and intact, right. Respiratory:  Respirations regular and unlabored, clear to auscultation bilaterally. GI: Soft, nontender,  nondistended, BS + x 4. MS: no deformity or atrophy. Skin: warm and dry, no rash. Neuro:  Strength and sensation are intact. Psych: AAOx3.  Normal affect.  Labs    CBC  Recent Labs  09/27/16 2124 09/27/16 2141 09/28/16 0528  WBC 4.6  --  5.7  HGB 16.1 15.6 13.7  HCT 46.4 46.0 41.1  MCV 96.5  --  100.0  PLT 170  --  [id number]*   Basic Metabolic Panel  Recent Labs  09/27/16 2124 09/27/16 2141 09/28/16 0528  NA 140 143 136  K 3.6 3.6 3.6  CL 107 104 106  CO2 23  --  26  GLUCOSE 95 96 92  BUN 9 11 8   CREATININE 0.85 1.10 0.74  CALCIUM 8.7*  --  7.9*   Liver Function Tests No results for input(s): AST, ALT, ALKPHOS, BILITOT, PROT, ALBUMIN in the last 72 hours. No results for input(s): LIPASE, AMYLASE in the last 72 hours. Cardiac Enzymes  Recent Labs  09/27/16 2357 09/28/16 0528 09/28/16 1127  TROPONINI 3.49* 23.62* 26.10*   BNP Invalid input(s): POCBNP D-Dimer No results for input(s): DDIMER in the last 72 hours. Hemoglobin  A1C No results for input(s): HGBA1C in the last 72 hours. Fasting Lipid Panel  Recent Labs  09/28/16 0537  CHOL 113  HDL 49  LDLCALC 27  TRIG 183*  CHOLHDL 2.3   Thyroid Function Tests No results for input(s): TSH, T4TOTAL, T3FREE, THYROIDAB in the last 72 hours.  Invalid input(s): FREET3  Telemetry    No ventricular tachycardia, normal sinus rhythm - Personally Reviewed  ECG    Original EKG with anterolateral ST segment elevation, sinus rhythm. - Personally Reviewed  Radiology    Dg Chest Portable 1 View  Result Date: 11-Oct-2016 CLINICAL DATA:  Acute onset of generalized chest pain. Initial encounter. EXAM: PORTABLE CHEST 1 VIEW COMPARISON:  Chest radiograph performed 09/01/2015 FINDINGS: The lungs are hypoexpanded. Vascular congestion is noted. There is no evidence of focal opacification, pleural effusion or pneumothorax. The cardiomediastinal silhouette is within normal limits. No acute osseous abnormalities are seen.  IMPRESSION: Vascular congestion noted.  Lungs hypoexpanded but grossly clear. Electronically Signed   By: Garald Balding M.D.   On: Oct 11, 2016 21:56    Cardiac Studies       Coronary angiogram 10/11/16: LVEF 40% with mid to distal anterior apical and inferoapical akinesis. Mild disease RCA and circumflex. Mid LAD occluded just proximal to the previously placed stent, S/P 3.0 x 22 mm resolute onyx DES, postdilated with 3.5 x 12 [place] at 16 atmospheric, 100% reduced to 0%, D2 stent jailed, balloon antroplasty with 2.0 x 12 mm balloon, 99% reduced to less than 10%, TIMI-3 to TIMI-3 in both vessels.  Recommendation: Patient will need dual antiplatelet therapy for at least one year or longer. Needs compliance reiteration. Smoking cessation already discussed with the patient. Moderation in alcohol use discussed.     Patient Profile     55 year old male with anterolateral ST elevation myocardial infarction, EF 40%  Assessment & Plan    Anterolateral ST elevation myocardial infarction  - Aspirin, Brilinta, high-intensity statin  - Smoking cessation  - Worried about cost of Brilinta, social work assistance appreciated.  - Okay to transfer to telemetry  - Cardiac rehabilitation  Ischemic cardiomyopathy, EF 40%  - I will Decrease carvedilol from 6.25 mg twice a day 3.125 mg twice a day as he was originally taking yesterday. Blood pressure could not tolerate.  - I tried to start lisinopril 5 mg once a day but blood pressure is 92/76. We will hold off at this time and try to initiate as outpatient.  - Currently does not appear volume overloaded. Hopefully ejection fraction will improve with time.  Tobacco use  - Tobacco cessation discussed.  Essential hypertension/hypotension this morning  - Beta blocker started here, ACE inhibitor and we will start his outpatient. Appears euvolemic. Ambulating well.  Discharge today. Discussed with him. He is eager to get home.  Signed, Candee Furbish, MD    09/29/2016, 11:56 AM

## 2016-09-29 NOTE — Progress Notes (Signed)
CARDIAC REHAB PHASE I   PRE:  Rate/Rhythm: 76 SR  BP:  Sitting: 94/70        SaO2: 97 RA  MODE:  Ambulation: 550 ft   POST:  Rate/Rhythm: 95 SR  BP:  Sitting: 104/88         SaO2: 98 RA  Pt ambulated 550 ft on RA, independent, steady gait, tolerated well with no complaints. Pt BP lower today, pt reports feeling more tired. Answered all questions, case manager to see pt prior to discharge. Phase 2 referral sent to [facility]. Pt to bed per pt request after walk, call bell within reach.    BC:[phone] Lenna Sciara, RN, BSN 09/29/2016 10:40 AM

## 2016-09-29 NOTE — Progress Notes (Signed)
S/W [facility] @ [facility] [place] # [id number].BRILINTA 90 MG BID 30 / 60 TAB  **  NOT COVER **   2. CLOPIDOGREL 75 MG BID 30 / 60 TAB   COVER- YES  CO-PAY- $44.80  PRIOR APPROVAL- NO  PHARMACY : [facility]

## 2016-10-03 ENCOUNTER — Telehealth: Payer: Self-pay | Admitting: Cardiology

## 2016-10-03 NOTE — Telephone Encounter (Signed)
New message    Pt verbalized that he has had a heart attack and that he wants Dr.Skains first available I offered him and he declined. He declined appt with any PA and he wants to see [name] only, he wants RN to call with an appt this week.

## 2016-10-03 NOTE — Telephone Encounter (Signed)
Patient is upset because he was told at the hospital to have a 1 week follow up with Cardiology. When he called this morning, however, he states he was offered 2 appointments one month away.  He also states he needs a doctor's note for when he was in the hospital last week. Apologized to patient for frustration this morning and scheduled him for post-hospital visit 1/5 with Truitt Merle.  Per discharge note: "A 7 day TOC f/u will be arranged.  He has been instructed not to drive, return to work, or have sex till we see him in the office." Work excuse letter placed at front desk for patient pick-up. He was grateful for call.

## 2016-10-03 NOTE — Telephone Encounter (Signed)
Follow up  Patient calling back stating he wants to see Dr [name] Pa, since he called previous this morning.   We schedule patient to see Truitt Merle on Jan 8,2018 2pm Pt needs a doctor note, and would like to speak to you regarding his care.

## 2016-10-06 ENCOUNTER — Encounter: Payer: Self-pay | Admitting: Nurse Practitioner

## 2016-10-06 ENCOUNTER — Ambulatory Visit (INDEPENDENT_AMBULATORY_CARE_PROVIDER_SITE_OTHER): Payer: 59 | Admitting: Nurse Practitioner

## 2016-10-06 ENCOUNTER — Other Ambulatory Visit: Payer: Self-pay | Admitting: *Deleted

## 2016-10-06 ENCOUNTER — Other Ambulatory Visit: Payer: Self-pay

## 2016-10-06 ENCOUNTER — Ambulatory Visit (HOSPITAL_COMMUNITY): Payer: 59 | Attending: Cardiovascular Disease

## 2016-10-06 ENCOUNTER — Telehealth: Payer: Self-pay | Admitting: Cardiology

## 2016-10-06 VITALS — BP 110/80 | HR 81 | Ht 71.0 in | Wt 185.1 lb

## 2016-10-06 DIAGNOSIS — I2510 Atherosclerotic heart disease of native coronary artery without angina pectoris: Secondary | ICD-10-CM

## 2016-10-06 DIAGNOSIS — I2583 Coronary atherosclerosis due to lipid rich plaque: Secondary | ICD-10-CM

## 2016-10-06 DIAGNOSIS — R29898 Other symptoms and signs involving the musculoskeletal system: Secondary | ICD-10-CM | POA: Diagnosis not present

## 2016-10-06 DIAGNOSIS — E7800 Pure hypercholesterolemia, unspecified: Secondary | ICD-10-CM | POA: Diagnosis not present

## 2016-10-06 DIAGNOSIS — I10 Essential (primary) hypertension: Secondary | ICD-10-CM

## 2016-10-06 DIAGNOSIS — I501 Left ventricular failure: Secondary | ICD-10-CM | POA: Insufficient documentation

## 2016-10-06 LAB — ECHOCARDIOGRAM COMPLETE
Height: 71 in
Weight: 2961.92 oz

## 2016-10-06 MED ORDER — PANTOPRAZOLE SODIUM 40 MG PO TBEC: 40.0000 mg | DELAYED_RELEASE_TABLET | Freq: Every day | ORAL | 11 refills | Status: DC

## 2016-10-06 MED ORDER — CARVEDILOL 6.25 MG PO TABS: 6.2500 mg | ORAL_TABLET | Freq: Two times a day (BID) | ORAL | 9 refills | Status: DC

## 2016-10-06 MED ORDER — PERFLUTREN LIPID MICROSPHERE
1.0000 mL | INTRAVENOUS | Status: AC | PRN
  Administered 2016-10-06: 2 mL via INTRAVENOUS

## 2016-10-06 MED ORDER — ISOSORBIDE MONONITRATE ER 30 MG PO TB24: 30.0000 mg | ORAL_TABLET | Freq: Every day | ORAL | 3 refills | Status: DC

## 2016-10-06 NOTE — Telephone Encounter (Signed)
New message      Calling to confirm pt had procedure on 09-27-16.  Please use claim number [id number] when calling back to confirm.

## 2016-10-06 NOTE — Telephone Encounter (Signed)
Returned call with claim number.  The rep was asking for me to give her the patient's DOB etc. (personal information).  I informed her I do not feel comfortable release this type of information to her not knowing who she is and whether pt has given any type of consent.  She is requesting CPT/ICD codes.  Advised she would need to contact the insurance/billing department.  She then requested to be transferred to MR.  Call was transferred.

## 2016-10-06 NOTE — Progress Notes (Signed)
CARDIOLOGY OFFICE NOTE  Date:  10/06/2016    Jeremiah Short Date of Birth: [birth year] Medical Record [id number]  PCP:  Melinda Crutch, MD  Cardiologist:  [facility]  Chief Complaint  Patient presents with  . Coronary Artery Disease    TOC visit - seen for Dr. [name]    History of Present Illness: Jeremiah Short is a 56 y.o. male who presents today for a post hospital visit. Seen for Dr. [name].   He has known CAD with prior STEMI in 01/23/2008 treated with PCI/ stenting, HTN, HLD, and has ongoing tobacco use.   Last seen here back in February. He was having chest pain and there was discussion about changing medicines and even possible cardiac catheterization.   He presented 09/27/16 with anterior ST elevation and was taken to the cath lab by Dr Einar Gip who was on call for STEMIs. Cath revealed mLAD occlusion just proximal to the previously placed LAD stent. He underwent successful PCI with DES. EF was 40% at cath. No echo noted. Post op he did well. He had low B/Ps and could not tolerate an ACE.   Comes in today. Here alone. He has had 2 spells of chest pain since discharge. Both at rest. Both similar to prior chest pain syndrome but he notes "can never tell if its my heart or my indigestion". He is not on any stomach medicine. Taking his other medicines. Used 3 NTG for the first incident because it lasted 45 minutes. Second spell was yesterday - lasted 20 minutes - no NTG use - it just resolved. He has not returned to work yet. Still smoking. Some bruising of the right forearm. Understands that he had a heart attack. No echo noted.   Past Medical History:  Diagnosis Date  . Anxiety   . Cardiomyopathy in other diseases classified elsewhere   . Coronary artery disease   . Depression   . Diverticulosis   . Esophageal stricture   . GERD (gastroesophageal reflux disease)   . History of ETOH abuse   . Hypertension   . Myocardial infarction 2009  . Pure hypercholesterolemia   . Smoker   .  Stented coronary artery   . Tubular adenoma of colon 2012  . Wears glasses     Past Surgical History:  Procedure Laterality Date  . APPENDECTOMY  2011   during colectomy  . CARDIAC CATHETERIZATION  2009   placed 2 stents  . CARDIAC CATHETERIZATION N/A 09/27/2016   Procedure: Left Heart Cath and Coronary Angiography;  Surgeon: [name], MD;  Location: [facility];  Service: Cardiovascular;  Laterality: N/A;  . CARDIAC CATHETERIZATION N/A 09/27/2016   Procedure: Coronary Stent Intervention;  Surgeon: [name], MD;  Location: [facility];  Service: Cardiovascular;  Laterality: N/A;  . CORONARY STENT PLACEMENT    . HERNIA REPAIR    . INGUINAL HERNIA REPAIR Left 09/16/2013   Procedure: HERNIA REPAIR INGUINAL ADULT;  Surgeon: [name], MD;  Location: [facility];  Service: General;  Laterality: Left;  . MOUTH SURGERY    . PARTIAL COLECTOMY  2011   sigmoid  . TONSILLECTOMY    . WISDOM TOOTH EXTRACTION       Medications: Current Outpatient Prescriptions  Medication Sig Dispense Refill  . aspirin EC 81 MG EC tablet Take 1 tablet (81 mg total) by mouth daily.    [name] atorvastatin (LIPITOR) 80 MG tablet Take 1 tablet (80 mg total) by mouth daily at  6 PM. 90 tablet 3  . carvedilol (COREG) 3.125 MG tablet Take 1 tablet (3.125 mg total) by mouth 2 (two) times daily with a meal. 180 tablet 3  . nitroGLYCERIN (NITROSTAT) 0.4 MG SL tablet Place 1 tablet (0.4 mg total) under the tongue every 5 (five) minutes x 3 doses as needed for chest pain. 25 tablet 2  . ticagrelor (BRILINTA) 90 MG TABS tablet Take 1 tablet (90 mg total) by mouth 2 (two) times daily. 180 tablet 3  . isosorbide mononitrate (IMDUR) 30 MG 24 hr tablet Take 1 tablet (30 mg total) by mouth daily. 30 tablet 3  . pantoprazole (PROTONIX) 40 MG tablet Take 1 tablet (40 mg total) by mouth daily. 30 tablet 11   No current facility-administered medications for this visit.     Allergies: Allergies    Allergen Reactions  . Morphine And Related     dont remember the symptoms    Social History: The patient  reports that he has been smoking E-cigarettes and Cigarettes.  He has a 30.00 pack-year smoking history. He has never used smokeless tobacco. He reports that he drinks about 54.0 oz of alcohol per week . He reports that he does not use drugs.   Family History: The patient's family history includes Breast cancer in his sister; Heart disease in his father; Lung cancer in his mother.   Review of Systems: Please see the history of present illness.   Otherwise, the review of systems is positive for none.   All other systems are reviewed and negative.   Physical Exam: VS:  BP 110/80   Pulse 81   Ht 5\' 11"  (1.803 m)   Wt 185 lb 1.9 oz (84 kg)   BMI 25.82 kg/m  .  BMI Body mass index is 25.82 kg/m.  Wt Readings from Last 3 Encounters:  10/06/16 185 lb 1.9 oz (84 kg)  09/29/16 185 lb (83.9 kg)  11/04/15 172 lb 9.6 oz (78.3 kg)    General: Pleasant. Well developed, well nourished and in no acute distress.   HEENT: Normal.  Neck: Supple, no JVD, carotid bruits, or masses noted.  Cardiac: Regular rate and rhythm. No murmurs, rubs, or gallops. No edema.  Respiratory:  Lungs are clear to auscultation bilaterally with normal work of breathing.  GI: Soft and nontender.  MS: No deformity or atrophy. Gait and ROM intact.  Skin: Warm and dry. Color is normal.  Neuro:  Strength and sensation are intact and no gross focal deficits noted.  Psych: Alert, appropriate and with normal affect.   LABORATORY DATA:  EKG:  EKG is ordered today. This demonstrates NSR with prior inferior infarct. Prior anterolateral infarct with evolving changes.  Lab Results  Component Value Date   WBC 5.7 09/28/2016   HGB 13.7 09/28/2016   HCT 41.1 09/28/2016   PLT 141 (L) 09/28/2016   GLUCOSE 92 09/28/2016   CHOL 113 09/28/2016   TRIG 183 (H) 09/28/2016   HDL 49 09/28/2016   LDLCALC 27 09/28/2016    ALT 22 09/15/2013   AST 19 09/15/2013   NA 136 09/28/2016   K 3.6 09/28/2016   CL 106 09/28/2016   CREATININE 0.74 09/28/2016   BUN 8 09/28/2016   CO2 26 09/28/2016   TSH 2.543 Test methodology is 3rd generation TSH 10/29/2009   INR 0.99 09/27/2016    BNP (last 3 results) No results for input(s): BNP in the last 8760 hours.  ProBNP (last 3 results) No results for input(s):  PROBNP in the last 8760 hours.   Other Studies Reviewed Today:   Coronary angiogram 09/27/2016: LVEF 40% with mid to distal anterior apical and inferoapical akinesis. Mild disease RCA and circumflex. Mid LAD occluded just proximal to the previously placed stent, S/P 3.0 x 22 mm resolute onyx DES, postdilated with 3.5 x 12 [place] at 16 atmospheric, 100% reduced to 0%, D2 stent jailed, balloon antroplasty with 2.0 x 12 mm balloon, 99% reduced to less than 10%, TIMI-3 to TIMI-3 in both vessels.  Recommendation: Patient will need dual antiplatelet therapy for at least one year or longer. Needs compliance reiteration. Smoking cessation already discussed with the patient. Moderation in alcohol use discussed.    Assessment/Plan: 1. Anterior STEMI with PCI to the LAD - D2 jailed and was treated with angioplasty - he has had 2 rather long spells of chest pain - says similar to recent presentation - adding Imdur 30 mg a day, adding Protonix 40 mg a day - will treat for both GI and CAD etiology. Not to return to work at this time - will reassess at follow up - his job is rather physical. Needs echo. Continue DAPT - may need indefinitely.   2. Reduced LV function by cath - needs echo - only on low dose beta blocker. Would try to add ACE going forward. Getting echo later today.   3. Ongoing tobacco abuse - has cut back - explained that total cessation needed.   4. HTN - BP soft today  5. HLD - on statin therapy  6. Noted moderate alcohol use. Not discussed today.   7. GERD - adding back PPI therapy   Current medicines are  reviewed with the patient today.  The patient does not have concerns regarding medicines other than what has been noted above.  The following changes have been made:  See above.  Labs/ tests ordered today include:    Orders Placed This Encounter  Procedures  . Basic metabolic panel  . CBC  . EKG 12-Lead  . ECHOCARDIOGRAM COMPLETE     Disposition:   FU with Dr. [name] in 7 to 10 days.   Patient is agreeable to this plan and will call if any problems develop in the interim.   Signed: Burtis Junes, RN, ANP-C 10/06/2016 12:26 PM  [facility] [address] [place], [place]  [address] Phone: [phone] Fax: [phone]

## 2016-10-06 NOTE — Patient Instructions (Addendum)
We will be checking the following labs today - BMET and CBC   Medication Instructions:    Continue with your current medicines. BUT  I am adding Protonix 40 mg to take one a day - this is for GERD  I am adding Imdur 30 mg to take one a day - this is for chest pain - this may cause a headache - ok to take in the daytime or at bedtime    Testing/Procedures To Be Arranged:  Echocardiogram  Follow-Up:   See me or Dr. [name] in 7 to 10 days    Other Special Instructions:   Not to return to work at this time - will readdress on return visit  Try to stop your smoking     If you need a refill on your cardiac medications before your next appointment, please call your pharmacy.   Call the [place] office at [phone] if you have any questions, problems or concerns.

## 2016-10-07 LAB — BASIC METABOLIC PANEL
BUN/Creatinine Ratio: 21 — ABNORMAL HIGH (ref 9–20)
BUN: 22 mg/dL (ref 6–24)
CO2: 25 mmol/L (ref 18–29)
Calcium: 9.4 mg/dL (ref 8.7–10.2)
Chloride: 98 mmol/L (ref 96–106)
Creatinine, Ser: 1.07 mg/dL (ref 0.76–1.27)
GFR calc Af Amer: 89 mL/min/{1.73_m2} (ref >59)
GFR calc non Af Amer: 77 mL/min/{1.73_m2} (ref >59)
Glucose: 83 mg/dL (ref 65–99)
Potassium: 4.7 mmol/L (ref 3.5–5.2)
Sodium: 138 mmol/L (ref 134–144)

## 2016-10-07 LAB — CBC
Hematocrit: 46.3 % (ref 37.5–51.0)
Hemoglobin: 15.8 g/dL (ref 13.0–17.7)
MCH: 33.8 pg — ABNORMAL HIGH (ref 26.6–33.0)
MCHC: 34.1 g/dL (ref 31.5–35.7)
MCV: 99 fL — ABNORMAL HIGH (ref 79–97)
Platelets: 217 10*3/uL (ref 150–379)
RBC: 4.67 x10E6/uL (ref 4.14–5.80)
RDW: 13.5 % (ref 12.3–15.4)
WBC: 5.6 10*3/uL (ref 3.4–10.8)

## 2016-10-09 ENCOUNTER — Encounter: Payer: Self-pay | Admitting: Nurse Practitioner

## 2016-10-12 ENCOUNTER — Ambulatory Visit (INDEPENDENT_AMBULATORY_CARE_PROVIDER_SITE_OTHER): Payer: 59 | Admitting: Cardiology

## 2016-10-12 ENCOUNTER — Encounter: Payer: Self-pay | Admitting: Cardiology

## 2016-10-12 ENCOUNTER — Encounter: Payer: Self-pay | Admitting: *Deleted

## 2016-10-12 VITALS — BP 126/84 | HR 68 | Ht 71.0 in | Wt 187.0 lb

## 2016-10-12 DIAGNOSIS — E7800 Pure hypercholesterolemia, unspecified: Secondary | ICD-10-CM

## 2016-10-12 DIAGNOSIS — I2510 Atherosclerotic heart disease of native coronary artery without angina pectoris: Secondary | ICD-10-CM

## 2016-10-12 DIAGNOSIS — I252 Old myocardial infarction: Secondary | ICD-10-CM

## 2016-10-12 DIAGNOSIS — I2583 Coronary atherosclerosis due to lipid rich plaque: Secondary | ICD-10-CM

## 2016-10-12 DIAGNOSIS — I10 Essential (primary) hypertension: Secondary | ICD-10-CM | POA: Diagnosis not present

## 2016-10-12 MED ORDER — LOSARTAN POTASSIUM 25 MG PO TABS: 25.0000 mg | ORAL_TABLET | Freq: Every day | ORAL | 3 refills | Status: DC

## 2016-10-12 MED ORDER — LANSOPRAZOLE 30 MG PO CPDR: 30.0000 mg | DELAYED_RELEASE_CAPSULE | Freq: Every day | ORAL | 3 refills | Status: DC

## 2016-10-12 NOTE — Patient Instructions (Signed)
Medication Instructions:  Please start Losartan 25 mg a day and Prevacid 30 mg a day. Continue all other medications as listed.  Follow-Up: Follow up in 6 weeks with Truitt Merle, NP  If you need a refill on your cardiac medications before your next appointment, please call your pharmacy.  Thank you for choosing [facility]!!

## 2016-10-12 NOTE — Progress Notes (Signed)
Cardiology Office Note    Date:  10/12/2016   ID:  Jeremiah Short, DOB 1960/10/09, MRN [id number]  PCP:  Melinda Crutch, MD  Cardiologist:   Candee Furbish, MD     History of Present Illness:  Jeremiah Short is a 56 y.o. male post anterior STEMI on 09/27/16 with mid LAD occlusion just proximal to the previously placed LAD stent. DES placed. EF 40% on cath, 30-35% on 10/06/16 echocardiogram as outpatient. Blood pressure soft postoperatively.  Saw Truitt Merle with 2 episodes of chest pain since discharge. Hard to tell whether it is indigestion or not.  He is feeling better. Blood pressure better today. We will start angiotensin receptor blocker. No significant shortness breath. He is eager to get back to work. I think this is reasonable. He asked about his short-term disability form that he had filled out. This has not crossed my desk as of yet.  No bleeding, no syncope, no orthopnea, no PND. Still continues to smoke.  Past Medical History:  Diagnosis Date  . Anxiety   . Cardiomyopathy in other diseases classified elsewhere   . Coronary artery disease   . Depression   . Diverticulosis   . Esophageal stricture   . GERD (gastroesophageal reflux disease)   . History of ETOH abuse   . Hypertension   . Myocardial infarction 2009  . Pure hypercholesterolemia   . Smoker   . Stented coronary artery   . Tubular adenoma of colon 2012  . Wears glasses     Past Surgical History:  Procedure Laterality Date  . APPENDECTOMY  2011   during colectomy  . CARDIAC CATHETERIZATION  2009   placed 2 stents  . CARDIAC CATHETERIZATION N/A 09/27/2016   Procedure: Left Heart Cath and Coronary Angiography;  Surgeon: [name], MD;  Location: [facility];  Service: Cardiovascular;  Laterality: N/A;  . CARDIAC CATHETERIZATION N/A 09/27/2016   Procedure: Coronary Stent Intervention;  Surgeon: [name], MD;  Location: [facility];  Service: Cardiovascular;  Laterality: N/A;  . CORONARY STENT  PLACEMENT    . HERNIA REPAIR    . INGUINAL HERNIA REPAIR Left 09/16/2013   Procedure: HERNIA REPAIR INGUINAL ADULT;  Surgeon: [name], MD;  Location: [facility];  Service: General;  Laterality: Left;  . MOUTH SURGERY    . PARTIAL COLECTOMY  2011   sigmoid  . TONSILLECTOMY    . WISDOM TOOTH EXTRACTION      Current Medications: Outpatient Medications Prior to Visit  Medication Sig Dispense Refill  . aspirin EC 81 MG EC tablet Take 1 tablet (81 mg total) by mouth daily.    [name] atorvastatin (LIPITOR) 80 MG tablet Take 1 tablet (80 mg total) by mouth daily at 6 PM. 90 tablet 3  . carvedilol (COREG) 6.25 MG tablet Take 1 tablet (6.25 mg total) by mouth 2 (two) times daily with a meal. 60 tablet 9  . isosorbide mononitrate (IMDUR) 30 MG 24 hr tablet Take 1 tablet (30 mg total) by mouth daily. 30 tablet 3  . nitroGLYCERIN (NITROSTAT) 0.4 MG SL tablet Place 1 tablet (0.4 mg total) under the tongue every 5 (five) minutes x 3 doses as needed for chest pain. 25 tablet 2  . ticagrelor (BRILINTA) 90 MG TABS tablet Take 1 tablet (90 mg total) by mouth 2 (two) times daily. 180 tablet 3  . pantoprazole (PROTONIX) 40 MG tablet Take 1 tablet (40 mg total) by mouth daily. 30 tablet 11  No facility-administered medications prior to visit.      Allergies:   Morphine and related   Social History   Social History  . Marital status: Divorced    Spouse name: N/A  . Number of children: 2  . Years of education: N/A   Occupational History  . Glass blower/designer    Social History Main Topics  . Smoking status: Current Every Day Smoker    Packs/day: 0.75    Years: 40.00    Types: E-cigarettes, Cigarettes  . Smokeless tobacco: Never Used     Comment: Pt given handout to quit smoking  . Alcohol use 54.0 oz/week    90 Shots of liquor per week     Comment: 1/5 vodka every 3 days (750 ml)  . Drug use: No  . Sexual activity: Not Asked   Other Topics Concern  . None   Social  History Narrative  . None     Family History:  The patient's family history includes Breast cancer in his sister; Heart disease in his father; Lung cancer in his mother.   ROS:   Please see the history of present illness.    ROS All other systems reviewed and are negative.   PHYSICAL EXAM:   VS:  BP 126/84   Pulse 68   Ht 5\' 11"  (1.803 m)   Wt 187 lb (84.8 kg)   BMI 26.08 kg/m    GEN: Well nourished, well developed, in no acute distress  HEENT: normal  Neck: no JVD, carotid bruits, or masses Cardiac: RRR; no murmurs, rubs, or gallops,no edema  Respiratory:  clear to auscultation bilaterally, normal work of breathing GI: soft, nontender, nondistended, + BS MS: no deformity or atrophy  Skin: warm and dry, no rash Neuro:  Alert and Oriented x 3, Strength and sensation are intact Psych: euthymic mood, full affect  Wt Readings from Last 3 Encounters:  10/12/16 187 lb (84.8 kg)  10/06/16 185 lb 1.9 oz (84 kg)  09/29/16 185 lb (83.9 kg)      Studies/Labs Reviewed:   EKG:  None today.--ST elevation anteriorly previously personally viewed  Recent Labs: 09/28/2016: Hemoglobin 13.7 10/06/2016: BUN 22; Creatinine, Ser 1.07; Platelets 217; Potassium 4.7; Sodium 138   Lipid Panel    Component Value Date/Time   CHOL 113 09/28/2016 0537   TRIG 183 (H) 09/28/2016 0537   HDL 49 09/28/2016 0537   CHOLHDL 2.3 09/28/2016 0537   VLDL 37 09/28/2016 0537   LDLCALC 27 09/28/2016 0537    Additional studies/ records that were reviewed today include:   ECHO 10/06/16 - Left ventricle: Wall thickness was increased in a pattern of   moderate LVH. Systolic function was moderately to severely   reduced. The estimated ejection fraction was in the range of 30%   to 35%. Akinesis of the basal-midanteroseptal and apical   myocardium. Doppler parameters are consistent with abnormal left   ventricular relaxation (grade 1 diastolic dysfunction).    ASSESSMENT:    1. Coronary artery disease  due to lipid rich plaque   2. Old MI (myocardial infarction)   3. Pure hypercholesterolemia   4. Essential hypertension      PLAN:  In order of problems listed above:  Anterior STEMI with PCI to the LAD  - D2 jailed and was treated with angioplasty   - Imdur 30 mg a day, adding Protonix 40 mg a day - will treat for both GI and CAD etiology. This seemed to help. He is requesting  a switch from Protonix over to Prevacid. This is fine. We will give him a 90 day supply.  - Return to work on next Monday. I'm comfortable with this. He is ready he states. No further anginal symptoms. Continue DAPT - may need indefinitely.   Chronic systolic heart failure  - NYHA class 1-2 currently  - Carvedilol 6.25 mg twice a day.  - Start losartan 25  - Echo next visit try to increase carvedilol to 12.5.  - Discussed implications of reduced ejection fraction  - Recommend repeat echocardiogram in 3 months postintervention as well as further titration of medications. If ejection fraction remains less than 35, ICD referral.  Ongoing tobacco abuse  - has cut back - explained that total cessation needed.  he has used Chantix in the past. Gives him nausea when he tries to smoke.  HTN  - BP better today  HLD  - on  high intensity statin therapy  - LDL goal less than 70  Noted moderate alcohol use. Not discussed today.   GERD  -Changing Protonix to Prevacid.     Medication Adjustments/Labs and Tests Ordered: Current medicines are reviewed at length with the patient today.  Concerns regarding medicines are outlined above.  Medication changes, Labs and Tests ordered today are listed in the Patient Instructions below. Patient Instructions  Medication Instructions:  Please start Losartan 25 mg a day and Prevacid 30 mg a day. Continue all other medications as listed.  Follow-Up: Follow up in 6 weeks with Truitt Merle, NP  If you need a refill on your cardiac medications before your next  appointment, please call your pharmacy.  Thank you for choosing [facility]!!        Signed, Candee Furbish, MD  10/12/2016 10:20 AM    [facility] [place], [place], [place]  [address] Phone: [phone]; Fax: [phone]

## 2016-10-13 ENCOUNTER — Telehealth: Payer: Self-pay | Admitting: Cardiology

## 2016-10-13 NOTE — Telephone Encounter (Signed)
Patient dropped off [employer] paperwork here today for Dr.Skains to complete-Dr.Skains completed theses papers for patient I called him and he is aware ready for pick up .

## 2016-11-17 ENCOUNTER — Encounter: Payer: Self-pay | Admitting: Nurse Practitioner

## 2016-11-17 ENCOUNTER — Telehealth: Payer: Self-pay | Admitting: Cardiology

## 2016-11-17 NOTE — Telephone Encounter (Signed)
Walk In pt form-Attending Physicians Statement-Dropped off. Will give to Pam/Skains on Tuesday 11/21/16.

## 2016-11-24 ENCOUNTER — Encounter (HOSPITAL_COMMUNITY): Payer: Self-pay | Admitting: Family Medicine

## 2016-11-24 NOTE — Progress Notes (Signed)
Mailed patient letter with information about Cardiac Rehab program. MW °

## 2016-11-27 ENCOUNTER — Encounter: Payer: Self-pay | Admitting: Nurse Practitioner

## 2016-11-27 ENCOUNTER — Ambulatory Visit (INDEPENDENT_AMBULATORY_CARE_PROVIDER_SITE_OTHER): Payer: 59 | Admitting: Nurse Practitioner

## 2016-11-27 VITALS — BP 122/90 | HR 97 | Ht 71.0 in | Wt 180.8 lb

## 2016-11-27 DIAGNOSIS — I5022 Chronic systolic (congestive) heart failure: Secondary | ICD-10-CM | POA: Diagnosis not present

## 2016-11-27 DIAGNOSIS — I2583 Coronary atherosclerosis due to lipid rich plaque: Secondary | ICD-10-CM

## 2016-11-27 DIAGNOSIS — I252 Old myocardial infarction: Secondary | ICD-10-CM | POA: Diagnosis not present

## 2016-11-27 DIAGNOSIS — I10 Essential (primary) hypertension: Secondary | ICD-10-CM | POA: Diagnosis not present

## 2016-11-27 DIAGNOSIS — E7800 Pure hypercholesterolemia, unspecified: Secondary | ICD-10-CM | POA: Diagnosis not present

## 2016-11-27 DIAGNOSIS — I2510 Atherosclerotic heart disease of native coronary artery without angina pectoris: Secondary | ICD-10-CM | POA: Diagnosis not present

## 2016-11-27 LAB — BASIC METABOLIC PANEL
BUN/Creatinine Ratio: 19 (ref 9–20)
BUN: 18 mg/dL (ref 6–24)
CO2: 22 mmol/L (ref 18–29)
Calcium: 9 mg/dL (ref 8.7–10.2)
Chloride: 103 mmol/L (ref 96–106)
Creatinine, Ser: 0.94 mg/dL (ref 0.76–1.27)
GFR calc Af Amer: 104 mL/min/{1.73_m2} (ref >59)
GFR calc non Af Amer: 90 mL/min/{1.73_m2} (ref >59)
Glucose: 67 mg/dL (ref 65–99)
Potassium: 4.2 mmol/L (ref 3.5–5.2)
Sodium: 144 mmol/L (ref 134–144)

## 2016-11-27 MED ORDER — CARVEDILOL 12.5 MG PO TABS: 12.5000 mg | ORAL_TABLET | Freq: Two times a day (BID) | ORAL | 3 refills | Status: DC

## 2016-11-27 NOTE — Progress Notes (Signed)
CARDIOLOGY OFFICE NOTE  Date:  11/27/2016    Jeremiah Short Date of Birth: 23-Dec-1960 Medical Record [id number]  PCP:  Melinda Crutch, MD  Cardiologist:  [facility]    Chief Complaint  Patient presents with  . Coronary Artery Disease    Follow up visit - seen for Dr. [name]    History of Present Illness: Jeremiah Short is a 56 y.o. male who presents today for a 6 week check. Seen for Dr. [name].   He has known CAD with prior STEMIin 01/23/2008 treated with PCI/ stenting, HTN, HLD, and has ongoing tobacco use.   He presented 09/27/16 with anterior ST elevation and was taken to the cath lab by Dr Einar Gip who was on call for STEMIs. Cath revealed mLAD occlusion just proximal to the previously placed LAD stent. He underwent successful PCI with DES. EF was 40% at cath. No echo noted. Post op he did well.   I saw him back - having recurrent chest pain - hard to discern etiology. PPI and Imdur added. Still smoking. Echo updated with EF 30 to 35% on 10/06/16. Seen by Dr. [name] about 6 weeks ago - was improved. ARB was started. Was given ok to return back to work. Needs follow up echo 3 months post intervention.   Comes in today. Here alone. Doing ok. No more chest pain. Feels pretty good. Not dizzy or lightheaded. Tolerating his medicines. Does not seem to understand why he is taking this medicines. Still smoking "some". Drinks several beers - every day - has done this for years. Back to work with no issue.   Past Medical History:  Diagnosis Date  . Anxiety   . Cardiomyopathy in other diseases classified elsewhere   . Coronary artery disease   . Depression   . Diverticulosis   . Esophageal stricture   . GERD (gastroesophageal reflux disease)   . History of ETOH abuse   . Hypertension   . Myocardial infarction 2009  . Pure hypercholesterolemia   . Smoker   . Stented coronary artery   . Tubular adenoma of colon 2012  . Wears glasses     Past Surgical History:  Procedure Laterality  Date  . APPENDECTOMY  2011   during colectomy  . CARDIAC CATHETERIZATION  2009   placed 2 stents  . CARDIAC CATHETERIZATION N/A 09/27/2016   Procedure: Left Heart Cath and Coronary Angiography;  Surgeon: [name], MD;  Location: [place] [facility];  Service: Cardiovascular;  Laterality: N/A;  . CARDIAC CATHETERIZATION N/A 09/27/2016   Procedure: Coronary Stent Intervention;  Surgeon: [name], MD;  Location: [place] [facility];  Service: Cardiovascular;  Laterality: N/A;  . CORONARY STENT PLACEMENT    . HERNIA REPAIR    . INGUINAL HERNIA REPAIR Left 09/16/2013   Procedure: HERNIA REPAIR INGUINAL ADULT;  Surgeon: [name], MD;  Location: [place];  Service: General;  Laterality: Left;  . MOUTH SURGERY    . PARTIAL COLECTOMY  2011   sigmoid  . TONSILLECTOMY    . WISDOM TOOTH EXTRACTION       Medications: Current Outpatient Prescriptions  Medication Sig Dispense Refill  . aspirin EC 81 MG EC tablet Take 1 tablet (81 mg total) by mouth daily.    [name] atorvastatin (LIPITOR) 80 MG tablet Take 1 tablet (80 mg total) by mouth daily at 6 PM. 90 tablet 3  . isosorbide mononitrate (IMDUR) 30 MG 24 hr tablet Take 1 tablet (30 mg total)  by mouth daily. 30 tablet 3  . lansoprazole (PREVACID) 30 MG capsule Take 30 mg by mouth as needed (gerd).    . losartan (COZAAR) 25 MG tablet Take 1 tablet (25 mg total) by mouth daily. 90 tablet 3  . nitroGLYCERIN (NITROSTAT) 0.4 MG SL tablet Place 1 tablet (0.4 mg total) under the tongue every 5 (five) minutes x 3 doses as needed for chest pain. 25 tablet 2  . ticagrelor (BRILINTA) 90 MG TABS tablet Take 1 tablet (90 mg total) by mouth 2 (two) times daily. 180 tablet 3  . carvedilol (COREG) 12.5 MG tablet Take 1 tablet (12.5 mg total) by mouth 2 (two) times daily. 180 tablet 3   No current facility-administered medications for this visit.     Allergies: Allergies  Allergen Reactions  . Morphine And Related     dont remember the  symptoms    Social History: The patient  reports that he has been smoking E-cigarettes and Cigarettes.  He has a 30.00 pack-year smoking history. He has never used smokeless tobacco. He reports that he drinks about 54.0 oz of alcohol per week . He reports that he does not use drugs.   Family History: The patient's family history includes Breast cancer in his sister; Heart disease in his father; Lung cancer in his mother.   Review of Systems: Please see the history of present illness.   Otherwise, the review of systems is positive for none.   All other systems are reviewed and negative.   Physical Exam: VS:  BP 122/90   Pulse 97   Ht 5\' 11"  (1.803 m)   Wt 180 lb 12.8 oz (82 kg)   SpO2 96% Comment: at rest  BMI 25.22 kg/m  .  BMI Body mass index is 25.22 kg/m.  Wt Readings from Last 3 Encounters:  11/27/16 180 lb 12.8 oz (82 kg)  10/12/16 187 lb (84.8 kg)  10/06/16 185 lb 1.9 oz (84 kg)    General: Pleasant. Well developed, well nourished and in no acute distress.   HEENT: Normal.  Neck: Supple, no JVD, carotid bruits, or masses noted.  Cardiac: Regular rate and rhythm. HR is fast. No murmurs, rubs, or gallops. No edema.  Respiratory:  Lungs are clear to auscultation bilaterally with normal work of breathing.  GI: Soft and nontender.  MS: No deformity or atrophy. Gait and ROM intact.  Skin: Warm and dry. Color is normal.  Neuro:  Strength and sensation are intact and no gross focal deficits noted.  Psych: Alert, appropriate and with normal affect.   LABORATORY DATA:  EKG:  EKG is not ordered today.  Lab Results  Component Value Date   WBC 5.6 10/06/2016   HGB 13.7 09/28/2016   HCT 46.3 10/06/2016   PLT 217 10/06/2016   GLUCOSE 83 10/06/2016   CHOL 113 09/28/2016   TRIG 183 (H) 09/28/2016   HDL 49 09/28/2016   LDLCALC 27 09/28/2016   ALT 22 09/15/2013   AST 19 09/15/2013   NA 138 10/06/2016   K 4.7 10/06/2016   CL 98 10/06/2016   CREATININE 1.07 10/06/2016    BUN 22 10/06/2016   CO2 25 10/06/2016   TSH 2.543 Test methodology is 3rd generation TSH 10/29/2009   INR 0.99 09/27/2016    BNP (last 3 results) No results for input(s): BNP in the last 8760 hours.  ProBNP (last 3 results) No results for input(s): PROBNP in the last 8760 hours.   Other Studies Reviewed Today:  ECHO 10/06/16 - Left ventricle: Wall thickness was increased in a pattern of moderate LVH. Systolic function was moderately to severely reduced. The estimated ejection fraction was in the range of 30% to 35%. Akinesis of the basal-midanteroseptal and apical myocardium. Doppler parameters are consistent with abnormal left ventricular relaxation (grade 1 diastolic dysfunction).  Coronary angiogram 09/27/2016: LVEF 40% with mid to distal anterior apical and inferoapical akinesis. Mild disease RCA and circumflex. Mid LAD occluded just proximal to the previously placed stent, S/P 3.0 x 22 mm resolute onyx DES, postdilated with 3.5 x 12 [place] at 16 atmospheric, 100% reduced to 0%, D2 stent jailed, balloon antroplasty with 2.0 x 12 mm balloon, 99% reduced to less than 10%, TIMI-3 to TIMI-3 in both vessels.  Recommendation: Patient will need dual antiplatelet therapy for at least one year or longer. Needs compliance reiteration. Smoking cessation already discussed with the patient. Moderation in alcohol use discussed.    Assessment/Plan:  1. CAD with recent Anterior STEMI with PCI to the LAD  - D2 jailed and was treated with angioplasty   - Imdur 30 mg a day, adding Protonix 40 mg a day due to recurrent chest pain - have treated for both GI and CAD etiology. Continue DAPT - may need indefinitely.   2. Chronic systolic heart failure  - NYHA class 1-2 currently. He has had ARB added. Increase Coreg to 12.5 mg BID. Discussed implications again about reduced EF - needs echo after Easter. Referral to EP for EF less than 40%. Hope to increase ARB on return.   3. Ongoing  tobacco abuse  - has cut back - explained that total cessation needed.  he has used Chantix in the past. Gives him nausea when he tries to smoke.  4. HTN  - BP better today - still trying to increase Coreg due to systolic HF.   5. HLD  - on  high intensity statin therapy  - LDL goal less than 70  6. Noted moderate alcohol use. Total cessation encouraged especially in light of his low EF.   7. GERD  -PPI  Current medicines are reviewed with the patient today.  The patient does not have concerns regarding medicines other than what has been noted above.  The following changes have been made:  See above.  Labs/ tests ordered today include:    Orders Placed This Encounter  Procedures  . Basic metabolic panel     Disposition:   FU with me in 2 weeks. Hope to increase ARB on return.    Patient is agreeable to this plan and will call if any problems develop in the interim.   SignedTruitt Merle, NP  11/27/2016 9:11 AM  [place] [address] [place], [place]  [address] Phone: [phone] Fax: [phone]

## 2016-11-27 NOTE — Patient Instructions (Addendum)
We will be checking the following labs today - BMET   Medication Instructions:    Continue with your current medicines. BUT  I am increasing the Coreg to 12.5 mg twice a day - you can take 2 of the 6.25 mg tablets twice a day (total of 4) and use up - the RX for the 12.5 is at the drug store.     Testing/Procedures To Be Arranged:  N/A  Follow-Up:   See me in 2 weeks    Other Special Instructions:   Keep trying to stop smoking!  Stop all alcohol.   Monitor your BP for [place]    If you need a refill on your cardiac medications before your next appointment, please call your pharmacy.   Call the [place] office at [phone] if you have any questions, problems or concerns.

## 2016-11-28 ENCOUNTER — Telehealth: Payer: Self-pay | Admitting: Cardiology

## 2016-11-28 NOTE — Telephone Encounter (Signed)
LVM for patient to return my call I have his Attending Physicians Statement papers ready for pick up.

## 2016-11-28 NOTE — Telephone Encounter (Signed)
Patient picked up paperwork Attending Physicians Statement

## 2016-12-04 ENCOUNTER — Encounter: Payer: Self-pay | Admitting: *Deleted

## 2016-12-11 ENCOUNTER — Encounter: Payer: Self-pay | Admitting: Nurse Practitioner

## 2016-12-11 ENCOUNTER — Ambulatory Visit (INDEPENDENT_AMBULATORY_CARE_PROVIDER_SITE_OTHER): Payer: 59 | Admitting: Nurse Practitioner

## 2016-12-11 VITALS — BP 130/90 | HR 68 | Ht 71.0 in | Wt 182.0 lb

## 2016-12-11 DIAGNOSIS — I5022 Chronic systolic (congestive) heart failure: Secondary | ICD-10-CM

## 2016-12-11 DIAGNOSIS — I259 Chronic ischemic heart disease, unspecified: Secondary | ICD-10-CM | POA: Diagnosis not present

## 2016-12-11 DIAGNOSIS — I10 Essential (primary) hypertension: Secondary | ICD-10-CM | POA: Diagnosis not present

## 2016-12-11 MED ORDER — LOSARTAN POTASSIUM 50 MG PO TABS: 50.0000 mg | ORAL_TABLET | Freq: Every day | ORAL | 3 refills | Status: DC

## 2016-12-11 MED ORDER — VARENICLINE TARTRATE 0.5 MG X 11 & 1 MG X 42 PO MISC: ORAL | 0 refills | Status: DC

## 2016-12-11 NOTE — Patient Instructions (Addendum)
We will be checking the following labs today -    Medication Instructions:    Continue with your current medicines. BUT  I am increasing the Losartan to 50 mg a day - you can take 2 of your 25 mg tablets to use up - I have sent the RX for the 50 mg to your drug store  I have sent in a RX for Chantix    Testing/Procedures To Be Arranged:  Echocardiogram after April 5th   Follow-Up:   See Dr. [name] in one month for discussion of repeat echo    Other Special Instructions:   N/A    If you need a refill on your cardiac medications before your next appointment, please call your pharmacy.   Call the [place] office at [phone] if you have any questions, problems or concerns.

## 2016-12-11 NOTE — Progress Notes (Signed)
CARDIOLOGY OFFICE NOTE  Date:  12/11/2016    Jeremiah Short Date of Birth: 08/19/1961 Medical Record #[id number]  PCP:  Melinda Crutch, MD  Cardiologist:  [facility]    Chief Complaint  Patient presents with  . Cardiomyopathy    Follow up visit - seen for Dr. [name]    History of Present Illness: Jeremiah Short is a 56 y.o. male who presents today for a follow up visit. Seen for Dr. [name].   He has known CAD with prior STEMIin 01/23/2008 treated with PCI/ stenting, HTN, HLD, and has ongoing tobacco use.   He presented 09/27/16 with anterior ST elevation and was taken to the cath lab by Dr Einar Gip who was on call for STEMIs. Cath revealed mLAD occlusion just proximal to the previously placed LAD stent. He underwent successful PCI with DES. EF was 40% at cath. No echo noted. Post op he did well.   I saw him back - having recurrent chest pain - hard to discern etiology. PPI and Imdur added. Still smoking. Echo updated with EF 30 to 35% on 10/06/16. Seen by Dr. [name] in follow up and was improved. ARB was started. Was given ok to return back to work. Needs follow up echo 3 months post intervention.   Last seen by me back about 2 weeks ago - was doing well. Titrating up his medicines. Still smoking. Has daily alcohol use.   Comes in today. Here alone. Doing ok. He is not dizzy or lightheaded. No chest pain. Breathing is good. Weight stable. Wanting Chantix - has used before and was successful with stopping. Still with fair amount of BP.   Past Medical History:  Diagnosis Date  . Anxiety   . Cardiomyopathy in other diseases classified elsewhere   . Coronary artery disease   . Depression   . Diverticulosis   . Esophageal stricture   . GERD (gastroesophageal reflux disease)   . History of ETOH abuse   . Hypertension   . Myocardial infarction 2009  . Pure hypercholesterolemia   . Smoker   . Stented coronary artery   . Tubular adenoma of colon 2012  . Wears glasses      Past Surgical History:  Procedure Laterality Date  . APPENDECTOMY  2011   during colectomy  . CARDIAC CATHETERIZATION  2009   placed 2 stents  . CARDIAC CATHETERIZATION N/A 09/27/2016   Procedure: Left Heart Cath and Coronary Angiography;  Surgeon: [name], MD;  Location: [place] [facility];  Service: Cardiovascular;  Laterality: N/A;  . CARDIAC CATHETERIZATION N/A 09/27/2016   Procedure: Coronary Stent Intervention;  Surgeon: [name], MD;  Location: [facility];  Service: Cardiovascular;  Laterality: N/A;  . CORONARY STENT PLACEMENT    . HERNIA REPAIR    . INGUINAL HERNIA REPAIR Left 09/16/2013   Procedure: HERNIA REPAIR INGUINAL ADULT;  Surgeon: [name], MD;  Location: [place];  Service: General;  Laterality: Left;  . MOUTH SURGERY    . PARTIAL COLECTOMY  2011   sigmoid  . TONSILLECTOMY    . WISDOM TOOTH EXTRACTION       Medications: Current Outpatient Prescriptions  Medication Sig Dispense Refill  . aspirin EC 81 MG EC tablet Take 1 tablet (81 mg total) by mouth daily.    [name] atorvastatin (LIPITOR) 80 MG tablet Take 1 tablet (80 mg total) by mouth daily at 6 PM. 90 tablet 3  . carvedilol (COREG) 12.5 MG tablet  Take 1 tablet (12.5 mg total) by mouth 2 (two) times daily. 180 tablet 3  . isosorbide mononitrate (IMDUR) 30 MG 24 hr tablet Take 1 tablet (30 mg total) by mouth daily. 30 tablet 3  . lansoprazole (PREVACID) 30 MG capsule Take 30 mg by mouth as needed (gerd).    . nitroGLYCERIN (NITROSTAT) 0.4 MG SL tablet Place 1 tablet (0.4 mg total) under the tongue every 5 (five) minutes x 3 doses as needed for chest pain. 25 tablet 2  . ticagrelor (BRILINTA) 90 MG TABS tablet Take 1 tablet (90 mg total) by mouth 2 (two) times daily. 180 tablet 3  . losartan (COZAAR) 50 MG tablet Take 1 tablet (50 mg total) by mouth daily. 90 tablet 3  . varenicline (CHANTIX STARTING MONTH PAK) 0.5 MG X 11 & 1 MG X 42 tablet Take one 0.5 mg tablet by mouth once  daily for 3 days, then increase to one 0.5 mg tablet twice daily for 4 days, then increase to one 1 mg tablet twice daily. 53 tablet 0   No current facility-administered medications for this visit.     Allergies: Allergies  Allergen Reactions  . Morphine And Related     dont remember the symptoms    Social History: The patient  reports that he has been smoking E-cigarettes and Cigarettes.  He has a 30.00 pack-year smoking history. He has never used smokeless tobacco. He reports that he drinks about 54.0 oz of alcohol per week . He reports that he does not use drugs.   Family History: The patient's family history includes Breast cancer in his sister; Heart disease in his father; Lung cancer in his mother.   Review of Systems: Please see the history of present illness.   Otherwise, the review of systems is positive for none.   All other systems are reviewed and negative.   Physical Exam: VS:  BP 130/90   Pulse 68   Ht 5\' 11"  (1.803 m)   Wt 182 lb (82.6 kg)   BMI 25.38 kg/m  .  BMI Body mass index is 25.38 kg/m.  Wt Readings from Last 3 Encounters:  12/11/16 182 lb (82.6 kg)  11/27/16 180 lb 12.8 oz (82 kg)  10/12/16 187 lb (84.8 kg)    General: Pleasant. Well developed, well nourished and in no acute distress.   HEENT: Normal.  Neck: Supple, no JVD, carotid bruits, or masses noted.  Cardiac: Regular rate and rhythm. No murmurs, rubs, or gallops. No edema.  Respiratory:  Lungs are clear to auscultation bilaterally with normal work of breathing.  GI: Soft and nontender.  MS: No deformity or atrophy. Gait and ROM intact.  Skin: Warm and dry. Color is normal.  Neuro:  Strength and sensation are intact and no gross focal deficits noted.  Psych: Alert, appropriate and with normal affect.   LABORATORY DATA:  EKG:  EKG is not ordered today.  Lab Results  Component Value Date   WBC 5.6 10/06/2016   HGB 13.7 09/28/2016   HCT 46.3 10/06/2016   PLT 217 10/06/2016   GLUCOSE  67 11/27/2016   CHOL 113 09/28/2016   TRIG 183 (H) 09/28/2016   HDL 49 09/28/2016   LDLCALC 27 09/28/2016   ALT 22 09/15/2013   AST 19 09/15/2013   NA 144 11/27/2016   K 4.2 11/27/2016   CL 103 11/27/2016   CREATININE 0.94 11/27/2016   BUN 18 11/27/2016   CO2 22 11/27/2016   TSH 2.543 Test  methodology is 3rd generation TSH 10/29/2009   INR 0.99 09/27/2016    BNP (last 3 results) No results for input(s): BNP in the last 8760 hours.  ProBNP (last 3 results) No results for input(s): PROBNP in the last 8760 hours.   Other Studies Reviewed Today:  ECHO 10/06/16 - Left ventricle: Wall thickness was increased in a pattern of moderate LVH. Systolic function was moderately to severely reduced. The estimated ejection fraction was in the range of 30% to 35%. Akinesis of the basal-midanteroseptal and apical myocardium. Doppler parameters are consistent with abnormal left ventricular relaxation (grade 1 diastolic dysfunction).  Coronary angiogram 09/27/2016: LVEF 40%with mid to distal anterior apical and inferoapical akinesis. Mild disease RCA and circumflex. Mid LAD occluded just proximal to the previously placed stent, S/P 3.0 x 22 mm resolute onyx DES, postdilated with 3.5 x 12 [place] at 16 atmospheric, 100% reduced to 0%, D2 stent jailed, balloon antroplasty with 2.0 x 12 mm balloon, 99% reduced to less than 10%, TIMI-3 to TIMI-3 in both vessels.  Recommendation: Patient will need dual antiplatelet therapy for at least one year or longer. Needs compliance reiteration. Smoking cessation already discussed with the patient. Moderation in alcohol use discussed.    Assessment/Plan:  1. CAD with recent Anterior STEMI with PCI to the LAD - D2 jailed and was treated with angioplasty  - Continue DAPT - may need indefinitely.   - doing well clinically.   2. Chronic systolic heart failure - NYHA class 1-2 currently. Increased Coreg at last visit. Still with plenty of BP -  will increase his Losartan today to 50 mg a day. Echo after April 5th and will then arrange follow up for possible ICD referral.  3. Ongoing tobacco abuse  - he is asking for Chantix. Has caused some nausea in the past but he wants to use this again to help him stop smoking.   4. HTN - increasing Losartan today  5. HLD - on high intensity statin therapy - LDL goal less than 70  6. Noted moderate alcohol use. Total cessation encouraged especially in light of his low EF.   7. GERD -PPI prn   Current medicines are reviewed with the patient today.  The patient does not have concerns regarding medicines other than what has been noted above.  The following changes have been made:  See above.  Labs/ tests ordered today include:    Orders Placed This Encounter  Procedures  . ECHOCARDIOGRAM COMPLETE     Disposition:   FU with Dr. [name] in one month after echocardiogram.   Patient is agreeable to this plan and will call if any problems develop in the interim.   SignedTruitt Merle, NP  12/11/2016 9:05 AM  [place] [address] [place], [place]  [address] Phone: [phone] Fax: [phone]

## 2017-01-07 ENCOUNTER — Other Ambulatory Visit: Payer: Self-pay | Admitting: Nurse Practitioner

## 2017-01-11 ENCOUNTER — Other Ambulatory Visit: Payer: Self-pay

## 2017-01-11 ENCOUNTER — Ambulatory Visit (HOSPITAL_COMMUNITY): Payer: 59 | Attending: Internal Medicine

## 2017-01-11 DIAGNOSIS — I2510 Atherosclerotic heart disease of native coronary artery without angina pectoris: Secondary | ICD-10-CM | POA: Insufficient documentation

## 2017-01-11 DIAGNOSIS — Z955 Presence of coronary angioplasty implant and graft: Secondary | ICD-10-CM | POA: Diagnosis not present

## 2017-01-11 DIAGNOSIS — I10 Essential (primary) hypertension: Secondary | ICD-10-CM | POA: Diagnosis not present

## 2017-01-11 DIAGNOSIS — I255 Ischemic cardiomyopathy: Secondary | ICD-10-CM | POA: Diagnosis present

## 2017-01-11 DIAGNOSIS — I259 Chronic ischemic heart disease, unspecified: Secondary | ICD-10-CM | POA: Diagnosis not present

## 2017-01-11 DIAGNOSIS — I110 Hypertensive heart disease with heart failure: Secondary | ICD-10-CM | POA: Insufficient documentation

## 2017-01-11 DIAGNOSIS — R29898 Other symptoms and signs involving the musculoskeletal system: Secondary | ICD-10-CM | POA: Diagnosis not present

## 2017-01-11 DIAGNOSIS — I5022 Chronic systolic (congestive) heart failure: Secondary | ICD-10-CM | POA: Diagnosis not present

## 2017-01-11 DIAGNOSIS — I252 Old myocardial infarction: Secondary | ICD-10-CM | POA: Diagnosis not present

## 2017-01-11 MED ORDER — PERFLUTREN LIPID MICROSPHERE
1.0000 mL | INTRAVENOUS | Status: AC | PRN
  Administered 2017-01-11: 2 mL via INTRAVENOUS

## 2017-01-16 ENCOUNTER — Encounter: Payer: Self-pay | Admitting: Cardiology

## 2017-01-16 ENCOUNTER — Ambulatory Visit (INDEPENDENT_AMBULATORY_CARE_PROVIDER_SITE_OTHER): Payer: 59 | Admitting: Cardiology

## 2017-01-16 VITALS — BP 110/74 | HR 79 | Ht 70.0 in | Wt 182.0 lb

## 2017-01-16 DIAGNOSIS — I5022 Chronic systolic (congestive) heart failure: Secondary | ICD-10-CM

## 2017-01-16 DIAGNOSIS — I420 Dilated cardiomyopathy: Secondary | ICD-10-CM

## 2017-01-16 DIAGNOSIS — I252 Old myocardial infarction: Secondary | ICD-10-CM

## 2017-01-16 DIAGNOSIS — I209 Angina pectoris, unspecified: Secondary | ICD-10-CM | POA: Diagnosis not present

## 2017-01-16 DIAGNOSIS — Z720 Tobacco use: Secondary | ICD-10-CM | POA: Diagnosis not present

## 2017-01-16 DIAGNOSIS — I2510 Atherosclerotic heart disease of native coronary artery without angina pectoris: Secondary | ICD-10-CM | POA: Diagnosis not present

## 2017-01-16 MED ORDER — VARENICLINE TARTRATE 1 MG PO TABS: 1.0000 mg | ORAL_TABLET | Freq: Two times a day (BID) | ORAL | 3 refills | Status: DC

## 2017-01-16 NOTE — Progress Notes (Signed)
Cardiology Office Note    Date:  01/16/2017   ID:  Jeremiah Short, DOB Jul 21, 1961, MRN [id number]  PCP:  Melinda Crutch, MD  Cardiologist:   Candee Furbish, MD     History of Present Illness:  Jeremiah Short is a 56 y.o. male post anterior STEMI on 09/27/16 with mid LAD occlusion just proximal to the previously placed LAD stent. DES placed. EF 40% on cath, 30-35% on 10/06/16 echocardiogram as outpatient. Blood pressure soft postoperatively.  Saw Truitt Merle with 2 episodes of chest pain since discharge. Hard to tell whether it is indigestion or not.  He is feeling better. Blood pressure better today. We will start angiotensin receptor blocker. No significant shortness breath. He is eager to get back to work. I think this is reasonable. He asked about his short-term disability form that he had filled out. This has not crossed my desk as of yet.  No bleeding, no syncope, no orthopnea, no PND. Still continues to smoke.Chantix seems to help him cut back.    Past Medical History:  Diagnosis Date  . Anxiety   . Cardiomyopathy in other diseases classified elsewhere   . Coronary artery disease   . Depression   . Diverticulosis   . Esophageal stricture   . GERD (gastroesophageal reflux disease)   . History of ETOH abuse   . Hypertension   . Myocardial infarction 2009  . Pure hypercholesterolemia   . Smoker   . Stented coronary artery   . Tubular adenoma of colon 2012  . Wears glasses     Past Surgical History:  Procedure Laterality Date  . APPENDECTOMY  2011   during colectomy  . CARDIAC CATHETERIZATION  2009   placed 2 stents  . CARDIAC CATHETERIZATION N/A 09/27/2016   Procedure: Left Heart Cath and Coronary Angiography;  Surgeon: [name], MD;  Location: [facility];  Service: Cardiovascular;  Laterality: N/A;  . CARDIAC CATHETERIZATION N/A 09/27/2016   Procedure: Coronary Stent Intervention;  Surgeon: [name], MD;  Location: [facility];  Service: Cardiovascular;   Laterality: N/A;  . CORONARY STENT PLACEMENT    . HERNIA REPAIR    . INGUINAL HERNIA REPAIR Left 09/16/2013   Procedure: HERNIA REPAIR INGUINAL ADULT;  Surgeon: [name], MD;  Location: [facility];  Service: General;  Laterality: Left;  . MOUTH SURGERY    . PARTIAL COLECTOMY  2011   sigmoid  . TONSILLECTOMY    . WISDOM TOOTH EXTRACTION      Current Medications: Outpatient Medications Prior to Visit  Medication Sig Dispense Refill  . aspirin EC 81 MG EC tablet Take 1 tablet (81 mg total) by mouth daily.    [name] atorvastatin (LIPITOR) 80 MG tablet Take 1 tablet (80 mg total) by mouth daily at 6 PM. 90 tablet 3  . carvedilol (COREG) 12.5 MG tablet Take 1 tablet (12.5 mg total) by mouth 2 (two) times daily. 180 tablet 3  . isosorbide mononitrate (IMDUR) 30 MG 24 hr tablet Take 1 tablet (30 mg total) by mouth daily. 30 tablet 3  . lansoprazole (PREVACID) 30 MG capsule Take 30 mg by mouth as needed (gerd).    . losartan (COZAAR) 50 MG tablet Take 1 tablet (50 mg total) by mouth daily. 90 tablet 3  . nitroGLYCERIN (NITROSTAT) 0.4 MG SL tablet Place 1 tablet (0.4 mg total) under the tongue every 5 (five) minutes x 3 doses as needed for chest pain. 25 tablet 2  .  ticagrelor (BRILINTA) 90 MG TABS tablet Take 1 tablet (90 mg total) by mouth 2 (two) times daily. 180 tablet 3  . varenicline (CHANTIX STARTING MONTH PAK) 0.5 MG X 11 & 1 MG X 42 tablet Take one 0.5 mg tablet by mouth once daily for 3 days, then increase to one 0.5 mg tablet twice daily for 4 days, then increase to one 1 mg tablet twice daily. 53 tablet 0   No facility-administered medications prior to visit.      Allergies:   Morphine and related   Social History   Social History  . Marital status: Divorced    Spouse name: N/A  . Number of children: 2  . Years of education: N/A   Occupational History  . Glass blower/designer    Social History Main Topics  . Smoking status: Current Every Day Smoker     Packs/day: 0.75    Years: 40.00    Types: E-cigarettes, Cigarettes  . Smokeless tobacco: Never Used     Comment: Pt given handout to quit smoking  . Alcohol use 54.0 oz/week    90 Shots of liquor per week     Comment: 1/5 vodka every 3 days (750 ml)  . Drug use: No  . Sexual activity: Not on file   Other Topics Concern  . Not on file   Social History Narrative  . No narrative on file     Family History:  The patient's family history includes Breast cancer in his sister; Heart disease in his father; Lung cancer in his mother.   ROS:   Please see the history of present illness.    ROS unless stated in history of present illness, all other review of systems negative. He is not having any bleeding, no syncope, orthopnea, PND, fevers.   PHYSICAL EXAM:   VS:  BP 110/74   Pulse 79   Ht 5\' 10"  (1.778 m)   Wt 182 lb (82.6 kg)   SpO2 97%   BMI 26.11 kg/m    GEN: Well nourished, well developed, in no acute distress  HEENT: normal  Neck: no JVD, carotid bruits, or masses Cardiac: RRR; no murmurs, rubs, or gallops,no edema  Respiratory:  clear to auscultation bilaterally, normal work of breathing GI: soft, nontender, nondistended, + BS MS: no deformity or atrophy  Skin: warm and dry, no rash Neuro:  Alert and Oriented x 3, Strength and sensation are intact Psych: euthymic mood, full affect  Wt Readings from Last 3 Encounters:  01/16/17 182 lb (82.6 kg)  12/11/16 182 lb (82.6 kg)  11/27/16 180 lb 12.8 oz (82 kg)      Studies/Labs Reviewed:   EKG:  None today.--ST elevation anteriorly previously personally viewed  Recent Labs: 09/28/2016: Hemoglobin 13.7 10/06/2016: Platelets 217 11/27/2016: BUN 18; Creatinine, Ser 0.94; Potassium 4.2; Sodium 144   Lipid Panel    Component Value Date/Time   CHOL 113 09/28/2016 0537   TRIG 183 (H) 09/28/2016 0537   HDL 49 09/28/2016 0537   CHOLHDL 2.3 09/28/2016 0537   VLDL 37 09/28/2016 0537   LDLCALC 27 09/28/2016 0537     Additional studies/ records that were reviewed today include:   ECHO 10/06/16 - Left ventricle: Wall thickness was increased in a pattern of   moderate LVH. Systolic function was moderately to severely   reduced. The estimated ejection fraction was in the range of 30%   to 35%. Akinesis of the basal-midanteroseptal and apical   myocardium. Doppler parameters are consistent  with abnormal left   ventricular relaxation (grade 1 diastolic dysfunction).  ECHO 01/11/17: - Compared to a prior study in 10/2016, the LVEF has improved to   45-50%. There are persistent distal anterior, anteroseptal,   apical and inferoapical wall motion abnormalities consistent with   LAD territory infarct/scar.  ASSESSMENT:    1. Coronary artery disease involving native coronary artery of native heart, angina presence unspecified   2. Dilated cardiomyopathy ([place])   3. Chronic systolic heart failure ([place])   4. Tobacco abuse   5. Old MI (myocardial infarction)   6. Angina pectoris ([place])      PLAN:  In order of problems listed above:  Anterior STEMI with PCI to the LAD  - D2 jailed and was treated with angioplasty   - Imdur 30 mg a day-Changes made Continue DAPT - may need indefinitely. I may wish to decrease Brilinta to 60 mg twice a day for 2 years after the one year.  Chronic systolic heart failure  - EF has improved to 45-50% post revasc and med titration  - NYHA class 1-2 currently  - Carvedilol 12.5 mg twice a day.  - losartan 50   Ongoing tobacco abuse  - Chantix. Gives him nausea when he tries to smoke.We will refill prescription for him. I explain him that this will not be something that he can take forever.  HTN  - BP better today, Stable. No changes made.  HLD  - on  high intensity statin therapy  - LDL goal less than 70 (was 27 in the hospital) lipid panel on 11/2016 at [facility] showed LDL of 26, ALT in normal range. Excellent.  - Stable, no changes.  GERD  -Prevacid.  Stable.    Medication Adjustments/Labs and Tests Ordered: Current medicines are reviewed at length with the patient today.  Concerns regarding medicines are outlined above.  Medication changes, Labs and Tests ordered today are listed in the Patient Instructions below. There are no Patient Instructions on file for this visit.   Signed, Candee Furbish, MD  01/16/2017 9:31 AM    [facility] [address], [place], [place]  [address] Phone: [phone]; Fax: [phone]

## 2017-01-16 NOTE — Patient Instructions (Signed)
Medication Instructions:  The current medical regimen is effective;  continue present plan and medications.  Follow-Up: Follow up in 6 months with Truitt Merle, NP  If you need a refill on your cardiac medications before your next appointment, please call your pharmacy.  Thank you for choosing [facility]!!

## 2017-01-18 ENCOUNTER — Telehealth: Payer: Self-pay | Admitting: Nurse Practitioner

## 2017-01-18 NOTE — Telephone Encounter (Signed)
[facility] requesting a refill on lansoprazole 30 mg tablet. This medication stated that it was a completed course on 10/12/16, but medication was left on pt's med list. Would you like to refill this medication. Please advise

## 2017-01-23 ENCOUNTER — Other Ambulatory Visit: Payer: Self-pay | Admitting: *Deleted

## 2017-01-23 MED ORDER — LANSOPRAZOLE 30 MG PO CPDR: 30.0000 mg | DELAYED_RELEASE_CAPSULE | ORAL | 3 refills | Status: DC | PRN

## 2017-01-31 ENCOUNTER — Other Ambulatory Visit: Payer: Self-pay | Admitting: Nurse Practitioner

## 2017-05-26 ENCOUNTER — Other Ambulatory Visit: Payer: Self-pay | Admitting: Nurse Practitioner

## 2017-07-16 ENCOUNTER — Ambulatory Visit (INDEPENDENT_AMBULATORY_CARE_PROVIDER_SITE_OTHER): Payer: 59 | Admitting: Nurse Practitioner

## 2017-07-16 ENCOUNTER — Encounter: Payer: Self-pay | Admitting: Nurse Practitioner

## 2017-07-16 ENCOUNTER — Telehealth: Payer: Self-pay | Admitting: *Deleted

## 2017-07-16 VITALS — BP 118/78 | HR 82 | Ht 70.0 in | Wt 167.0 lb

## 2017-07-16 DIAGNOSIS — I10 Essential (primary) hypertension: Secondary | ICD-10-CM

## 2017-07-16 DIAGNOSIS — I5022 Chronic systolic (congestive) heart failure: Secondary | ICD-10-CM

## 2017-07-16 DIAGNOSIS — I259 Chronic ischemic heart disease, unspecified: Secondary | ICD-10-CM

## 2017-07-16 MED ORDER — ISOSORBIDE MONONITRATE ER 30 MG PO TB24: 30.0000 mg | ORAL_TABLET | Freq: Every day | ORAL | 3 refills | Status: DC

## 2017-07-16 NOTE — Progress Notes (Signed)
CARDIOLOGY OFFICE NOTE  Date:  07/16/2017    Jeremiah Short Date of Birth: December 05, 1960 Medical Record #[id number]  PCP:  Lawerance Cruel, MD  Cardiologist:  Servando Snare [facility]    Chief Complaint  Patient presents with  . Coronary Artery Disease    Follow up visit - seen for Dr. [name]    History of Present Illness: Jeremiah Short is a 56 y.o. male who presents today for a 6 month check. Seen for Dr. [name].   He has known CAD with prior STEMIin 01/23/2008 treated with PCI/ stenting, HTN, HLD, and has ongoing tobacco use.   He presented 09/27/16 with anterior ST elevation and was taken to the cath lab by Dr Einar Gip who was on call for STEMIs. Cath revealed mLAD occlusion just proximal to the previously placed LAD stent. He underwent successful PCI with DES. EF was 40% at cath. No echo noted. Post op he did well.   I then saw him back - having recurrent chest pain - hard to discern etiology. PPI and Imdur added. Still smoking but wanting Chantix. Echo was updated with EF 30 to 35% on 10/06/16 and improved to 45 to 50% on echo in April of 2018. Seen by Dr. [name] in follow up and was improved. ARB was started. Was given ok to return back to work. Saw Dr. [name] back in April - he was doing better.   Comes in today. Here alone. He notes he is drowsy at times. Little dizzy with bending over. He works 3rd shift - wonders if he is "naturally tired" due to this schedule.  No chest pain reported. Has lost weight - says "I always fluctuate" - probably related to stopping/starting smoking. Doing well overall. He is back smoking - Chantix does work as long as he is taking Chantix - but he is "back off the wagon". Overall, seems to be doing ok. He is not fasting today - just had a sausage biscuit out in our waiting room.   Past Medical History:  Diagnosis Date  . Anxiety   . Cardiomyopathy in other diseases classified elsewhere   . Coronary artery disease   . Depression   . Diverticulosis     . Esophageal stricture   . GERD (gastroesophageal reflux disease)   . History of ETOH abuse   . Hypertension   . Myocardial infarction ([place]) 2009  . Pure hypercholesterolemia   . Smoker   . Stented coronary artery   . Tubular adenoma of colon 2012  . Wears glasses     Past Surgical History:  Procedure Laterality Date  . APPENDECTOMY  2011   during colectomy  . CARDIAC CATHETERIZATION  2009   placed 2 stents  . CARDIAC CATHETERIZATION N/A 09/27/2016   Procedure: Left Heart Cath and Coronary Angiography;  Surgeon: [name], MD;  Location: [facility];  Service: Cardiovascular;  Laterality: N/A;  . CARDIAC CATHETERIZATION N/A 09/27/2016   Procedure: Coronary Stent Intervention;  Surgeon: [name], MD;  Location: [facility];  Service: Cardiovascular;  Laterality: N/A;  . CORONARY STENT PLACEMENT    . HERNIA REPAIR    . INGUINAL HERNIA REPAIR Left 09/16/2013   Procedure: HERNIA REPAIR INGUINAL ADULT;  Surgeon: [name], MD;  Location: [facility];  Service: General;  Laterality: Left;  . MOUTH SURGERY    . PARTIAL COLECTOMY  2011   sigmoid  . TONSILLECTOMY    . WISDOM TOOTH EXTRACTION  Medications: Current Meds  Medication Sig  . aspirin EC 81 MG EC tablet Take 1 tablet (81 mg total) by mouth daily.  [name] atorvastatin (LIPITOR) 80 MG tablet Take 1 tablet (80 mg total) by mouth daily at 6 PM.  . carvedilol (COREG) 12.5 MG tablet Take 1 tablet (12.5 mg total) by mouth 2 (two) times daily.  . isosorbide mononitrate (IMDUR) 30 MG 24 hr tablet Take 1 tablet (30 mg total) by mouth daily.  . lansoprazole (PREVACID) 30 MG capsule Take 1 capsule (30 mg total) by mouth as needed (gerd).  . nitroGLYCERIN (NITROSTAT) 0.4 MG SL tablet Place 1 tablet (0.4 mg total) under the tongue every 5 (five) minutes x 3 doses as needed for chest pain.  . ticagrelor (BRILINTA) 90 MG TABS tablet Take 1 tablet (90 mg total) by mouth 2 (two) times daily.  .  [DISCONTINUED] isosorbide mononitrate (IMDUR) 30 MG 24 hr tablet TAKE 1 TABLET (30 MG TOTAL) BY MOUTH DAILY.  . [DISCONTINUED] losartan (COZAAR) 50 MG tablet Take 1 tablet (50 mg total) by mouth daily.     Allergies: Allergies  Allergen Reactions  . Morphine And Related     dont remember the symptoms    Social History: The patient  reports that he has been smoking E-cigarettes and Cigarettes.  He has a 30.00 pack-year smoking history. He has never used smokeless tobacco. He reports that he drinks about 54.0 oz of alcohol per week . He reports that he does not use drugs.   Family History: The patient's family history includes Breast cancer in his sister; Heart disease in his father; Lung cancer in his mother.   Review of Systems: Please see the history of present illness.   Otherwise, the review of systems is positive for none.   All other systems are reviewed and negative.   Physical Exam: VS:  BP 118/78 (BP Location: Left Arm, Patient Position: Sitting, Cuff Size: Normal)   Pulse 82   Ht 5\' 10"  (1.778 m)   Wt 167 lb (75.8 kg)   SpO2 97% Comment: at rest  BMI 23.96 kg/m  .  BMI Body mass index is 23.96 kg/m.  Wt Readings from Last 3 Encounters:  07/16/17 167 lb (75.8 kg)  01/16/17 182 lb (82.6 kg)  12/11/16 182 lb (82.6 kg)    General: Pleasant. Well developed, well nourished and in no acute distress.  He has lost weight. He is down 15 pounds since April.  HEENT: Normal.  Neck: Supple, no JVD, carotid bruits, or masses noted.  Cardiac: Regular rate and rhythm. No murmurs, rubs, or gallops. No edema.  Respiratory:  Lungs are coarse but with normal work of breathing.  GI: Soft and nontender.  MS: No deformity or atrophy. Gait and ROM intact.  Skin: Warm and dry. Color is normal.  Neuro:  Strength and sensation are intact and no gross focal deficits noted.  Psych: Alert, appropriate and with normal affect.   LABORATORY DATA:  EKG:  EKG is not ordered today.  Lab  Results  Component Value Date   WBC 5.6 10/06/2016   HGB 15.8 10/06/2016   HCT 46.3 10/06/2016   PLT 217 10/06/2016   GLUCOSE 67 11/27/2016   CHOL 113 09/28/2016   TRIG 183 (H) 09/28/2016   HDL 49 09/28/2016   LDLCALC 27 09/28/2016   ALT 22 09/15/2013   AST 19 09/15/2013   NA 144 11/27/2016   K 4.2 11/27/2016   CL 103 11/27/2016   CREATININE  0.94 11/27/2016   BUN 18 11/27/2016   CO2 22 11/27/2016   TSH 2.543 Test methodology is 3rd generation TSH 10/29/2009   INR 0.99 09/27/2016       BNP (last 3 results) No results for input(s): BNP in the last 8760 hours.  ProBNP (last 3 results) No results for input(s): PROBNP in the last 8760 hours.   Other Studies Reviewed Today:  ECHO 01/11/17: - Compared to a prior study in 10/2016, the LVEF has improved to 45-50%. There are persistent distal anterior, anteroseptal, apical and inferoapical wall motion abnormalities consistent with LAD territory infarct/scar.  ECHO 10/06/16 - Left ventricle: Wall thickness was increased in a pattern of moderate LVH. Systolic function was moderately to severely reduced. The estimated ejection fraction was in the range of 30% to 35%. Akinesis of the basal-midanteroseptal and apical myocardium. Doppler parameters are consistent with abnormal left ventricular relaxation (grade 1 diastolic dysfunction).   Coronary angiogram 09/27/2016: LVEF 40%with mid to distal anterior apical and inferoapical akinesis. Mild disease RCA and circumflex. Mid LAD occluded just proximal to the previously placed stent, S/P 3.0 x 22 mm resolute onyx DES, postdilated with 3.5 x 12 [place] at 16 atmospheric, 100% reduced to 0%, D2 stent jailed, balloon antroplasty with 2.0 x 12 mm balloon, 99% reduced to less than 10%, TIMI-3 to TIMI-3 in both vessels.  Recommendation: Patient will need dual antiplatelet therapy for at least one year or longer. Needs compliance reiteration. Smoking cessation already discussed  with the patient. Moderation in alcohol use discussed.    Assessment/Plan:  1. CAD with prior Anterior STEMI with PCI to the LAD December 2017 -  - D2 jailed and was treated with angioplasty  - Continue DAPT - may need indefinitely. Dr. [name] has noted that we may wish to decrease Brilinta to 60 mg twice a day for 2 years after the one year is complete. He is currently doing well without recurrent chest pain. New RX for his Imdur was given today. No other changes. Needs to work on smoking cessation.    2. Chronic systolic heart failure - doing well clinically. No symptoms. EF has improved by last echo. He is on beta blocker, ARB and nitrate. No changes made today.   3. Ongoing tobacco abuse - not ready to stop.  4. HTN- BP is fine here today. No changes made. His dizziness is more positional and probably aggravated by his ARB but not to the point that we need to cut back in my opinion.   5. HLD- not fasting today - will plan to recheck later in the next week or so.    6. History of moderate alcohol use - not discussed today.  7. GERD- using PPI just prn.   Current medicines are reviewed with the patient today.  The patient does not have concerns regarding medicines other than what has been noted above.  The following changes have been made:  See above.  Labs/ tests ordered today include:   No orders of the defined types were placed in this encounter.    Disposition:   FU with Dr. [name] in 4 months.    Patient is agreeable to this plan and will call if any problems develop in the interim.   SignedTruitt Merle, NP  07/16/2017 9:39 AM  [facility] [address] [place], [place]  [address] Phone: [phone] Fax: [phone]

## 2017-07-16 NOTE — Patient Instructions (Addendum)
We will be checking the following labs today - NONE  Fasting labs later this week - BMET, CBC, HPF and lipids   Medication Instructions:    Continue with your current medicines.     Testing/Procedures To Be Arranged:  N/A  Follow-Up:   See Dr. [name] in 4 months.     Other Special Instructions:   Keep working on stopping smoking.     If you need a refill on your cardiac medications before your next appointment, please call your pharmacy.   Call the [facility] office at [phone] if you have any questions, problems or concerns.

## 2017-07-16 NOTE — Telephone Encounter (Signed)
S/w pt to let pt know leaving script for imdur at front desk.  Pt will pick up on Monday when pt is scheduled for labs.

## 2017-07-17 ENCOUNTER — Other Ambulatory Visit: Payer: 59 | Admitting: *Deleted

## 2017-07-17 DIAGNOSIS — I259 Chronic ischemic heart disease, unspecified: Secondary | ICD-10-CM

## 2017-07-17 DIAGNOSIS — I5022 Chronic systolic (congestive) heart failure: Secondary | ICD-10-CM

## 2017-07-17 DIAGNOSIS — I10 Essential (primary) hypertension: Secondary | ICD-10-CM

## 2017-07-18 LAB — BASIC METABOLIC PANEL
BUN/Creatinine Ratio: 14 (ref 9–20)
BUN: 12 mg/dL (ref 6–24)
CO2: 23 mmol/L (ref 20–29)
Calcium: 8.7 mg/dL (ref 8.7–10.2)
Chloride: 104 mmol/L (ref 96–106)
Creatinine, Ser: 0.87 mg/dL (ref 0.76–1.27)
GFR calc Af Amer: 111 mL/min/{1.73_m2} (ref >59)
GFR calc non Af Amer: 96 mL/min/{1.73_m2} (ref >59)
Glucose: 89 mg/dL (ref 65–99)
Potassium: 4.3 mmol/L (ref 3.5–5.2)
Sodium: 140 mmol/L (ref 134–144)

## 2017-07-18 LAB — CBC
Hematocrit: 41.1 % (ref 37.5–51.0)
Hemoglobin: 13.9 g/dL (ref 13.0–17.7)
MCH: 33.5 pg — ABNORMAL HIGH (ref 26.6–33.0)
MCHC: 33.8 g/dL (ref 31.5–35.7)
MCV: 99 fL — ABNORMAL HIGH (ref 79–97)
Platelets: 152 10*3/uL (ref 150–379)
RBC: 4.15 x10E6/uL (ref 4.14–5.80)
RDW: 13.8 % (ref 12.3–15.4)
WBC: 5.3 10*3/uL (ref 3.4–10.8)

## 2017-07-18 LAB — LIPID PANEL
Chol/HDL Ratio: 1.8 ratio (ref 0.0–5.0)
Cholesterol, Total: 111 mg/dL (ref 100–199)
HDL: 63 mg/dL (ref >39)
LDL Calculated: 29 mg/dL (ref 0–99)
Triglycerides: 94 mg/dL (ref 0–149)
VLDL Cholesterol Cal: 19 mg/dL (ref 5–40)

## 2017-07-18 LAB — HEPATIC FUNCTION PANEL
ALT: 24 IU/L (ref 0–44)
AST: 34 IU/L (ref 0–40)
Albumin: 4.1 g/dL (ref 3.5–5.5)
Alkaline Phosphatase: 59 IU/L (ref 39–117)
Bilirubin Total: 0.4 mg/dL (ref 0.0–1.2)
Bilirubin, Direct: 0.17 mg/dL (ref 0.00–0.40)
Total Protein: 6 g/dL (ref 6.0–8.5)

## 2017-07-23 ENCOUNTER — Other Ambulatory Visit: Payer: 59

## 2017-09-05 ENCOUNTER — Other Ambulatory Visit: Payer: Self-pay | Admitting: Cardiology

## 2017-09-17 ENCOUNTER — Other Ambulatory Visit: Payer: Self-pay | Admitting: Cardiology

## 2017-09-28 ENCOUNTER — Encounter: Payer: Self-pay | Admitting: Gastroenterology

## 2017-11-05 ENCOUNTER — Ambulatory Visit (INDEPENDENT_AMBULATORY_CARE_PROVIDER_SITE_OTHER): Payer: 59 | Admitting: Cardiology

## 2017-11-05 ENCOUNTER — Encounter: Payer: Self-pay | Admitting: Cardiology

## 2017-11-05 VITALS — BP 124/84 | HR 67 | Ht 70.0 in | Wt 166.5 lb

## 2017-11-05 DIAGNOSIS — Z720 Tobacco use: Secondary | ICD-10-CM

## 2017-11-05 DIAGNOSIS — I5022 Chronic systolic (congestive) heart failure: Secondary | ICD-10-CM | POA: Diagnosis not present

## 2017-11-05 DIAGNOSIS — I2510 Atherosclerotic heart disease of native coronary artery without angina pectoris: Secondary | ICD-10-CM

## 2017-11-05 DIAGNOSIS — E7800 Pure hypercholesterolemia, unspecified: Secondary | ICD-10-CM

## 2017-11-05 DIAGNOSIS — I259 Chronic ischemic heart disease, unspecified: Secondary | ICD-10-CM | POA: Diagnosis not present

## 2017-11-05 MED ORDER — VARENICLINE TARTRATE 0.5 MG X 11 & 1 MG X 42 PO MISC: ORAL | 0 refills | Status: DC

## 2017-11-05 MED ORDER — VARENICLINE TARTRATE 1 MG PO TABS: 1.0000 mg | ORAL_TABLET | Freq: Two times a day (BID) | ORAL | 1 refills | Status: DC

## 2017-11-05 NOTE — Patient Instructions (Signed)
Medication Instructions:  1) you have been given a prescription for CHANTIX. Please take as directed. 2) Please call when you are running out of [place]. We will call in a lower dose - Brilinta 30 mg tablets at that time.  Labwork: None  Testing/Procedures: None  Follow-Up: Your provider wants you to follow-up in: 6 months with Truitt Merle, NP. You will receive a reminder letter in the mail two months in advance. If you don't receive a letter, please call our office to schedule the follow-up appointment.    Your provider wants you to follow-up in: 1 year with Dr. [name]. You will receive a reminder letter in the mail two months in advance. If you don't receive a letter, please call our office to schedule the follow-up appointment.    Any Other Special Instructions Will Be Listed Below (If Applicable).     If you need a refill on your cardiac medications before your next appointment, please call your pharmacy.

## 2017-11-05 NOTE — Progress Notes (Signed)
Cardiology Office Note    Date:  11/05/2017   ID:  Jeremiah Short, DOB 11-27-1960, MRN [id number]  PCP:  Lawerance Cruel, MD  Cardiologist:   Candee Furbish, MD     History of Present Illness:  Jeremiah Short is a 57 y.o. male post anterior STEMI on 09/27/16 with mid LAD occlusion just proximal to the previously placed LAD stent. DES placed. EF 40% on cath, 30-35% on 10/06/16 echocardiogram as outpatient. Blood pressure soft postoperatively.  Saw Jeremiah Short with 2 episodes of chest pain since discharge. Hard to tell whether it is indigestion or not.  11/05/17 - last Thursday took NTG for chest pain and it resolved. 30-68min. Quickly  Resolved.  Has not had any recurrences.  EKG at baseline shows ST segment and T wave inversions in the precordial anterior leads.  He is still smoking.  Still wants Chantix again.  Getting a deal on Brilinta he states.  Past Medical History:  Diagnosis Date  . Anxiety   . Cardiomyopathy in other diseases classified elsewhere   . Coronary artery disease   . Depression   . Diverticulosis   . Esophageal stricture   . GERD (gastroesophageal reflux disease)   . History of ETOH abuse   . Hypertension   . Myocardial infarction ([place]) 2009  . Pure hypercholesterolemia   . Smoker   . Stented coronary artery   . Tubular adenoma of colon 2012  . Wears glasses     Past Surgical History:  Procedure Laterality Date  . APPENDECTOMY  2011   during colectomy  . CARDIAC CATHETERIZATION  2009   placed 2 stents  . CARDIAC CATHETERIZATION N/A 09/27/2016   Procedure: Left Heart Cath and Coronary Angiography;  Surgeon: [name], MD;  Location: [facility];  Service: Cardiovascular;  Laterality: N/A;  . CARDIAC CATHETERIZATION N/A 09/27/2016   Procedure: Coronary Stent Intervention;  Surgeon: [name], MD;  Location: [facility];  Service: Cardiovascular;  Laterality: N/A;  . CORONARY STENT PLACEMENT    . HERNIA REPAIR    . INGUINAL HERNIA REPAIR  Left 09/16/2013   Procedure: HERNIA REPAIR INGUINAL ADULT;  Surgeon: [name], MD;  Location: [facility];  Service: General;  Laterality: Left;  . MOUTH SURGERY    . PARTIAL COLECTOMY  2011   sigmoid  . TONSILLECTOMY    . WISDOM TOOTH EXTRACTION      Current Medications: Outpatient Medications Prior to Visit  Medication Sig Dispense Refill  . aspirin EC 81 MG EC tablet Take 1 tablet (81 mg total) by mouth daily.    [name] atorvastatin (LIPITOR) 80 MG tablet TAKE 1 TABLET (80 MG TOTAL) BY MOUTH DAILY AT 6 PM. 90 tablet 3  . BRILINTA 90 MG TABS tablet TAKE 1 TABLET (90 MG TOTAL) BY MOUTH 2 (TWO) TIMES DAILY. 180 tablet 2  . lansoprazole (PREVACID) 30 MG capsule Take 1 capsule (30 mg total) by mouth as needed (gerd). 30 capsule 3  . nitroGLYCERIN (NITROSTAT) 0.4 MG SL tablet Place 1 tablet (0.4 mg total) under the tongue every 5 (five) minutes x 3 doses as needed for chest pain. 25 tablet 2  . carvedilol (COREG) 12.5 MG tablet Take 1 tablet (12.5 mg total) by mouth 2 (two) times daily. 180 tablet 3  . isosorbide mononitrate (IMDUR) 30 MG 24 hr tablet Take 1 tablet (30 mg total) by mouth daily. 90 tablet 3   No facility-administered medications prior to visit.  Allergies:   Morphine and related   Social History   Socioeconomic History  . Marital status: Divorced    Spouse name: None  . Number of children: 2  . Years of education: None  . Highest education level: None  Social Needs  . Financial resource strain: None  . Food insecurity - worry: None  . Food insecurity - inability: None  . Transportation needs - medical: None  . Transportation needs - non-medical: None  Occupational History  . Occupation: Glass blower/designer  Tobacco Use  . Smoking status: Current Every Day Smoker    Packs/day: 0.75    Years: 40.00    Pack years: 30.00    Types: E-cigarettes, Cigarettes  . Smokeless tobacco: Never Used  . Tobacco comment: Pt given handout to quit smoking    Substance and Sexual Activity  . Alcohol use: Yes    Alcohol/week: 54.0 oz    Types: 90 Shots of liquor per week    Comment: 1/5 vodka every 3 days (750 ml)  . Drug use: No  . Sexual activity: None  Other Topics Concern  . None  Social History Narrative  . None     Family History:  The patient's family history includes Breast cancer in his sister; Heart disease in his father; Lung cancer in his mother.   ROS:   Please see the history of present illness.    Review of Systems  All other systems reviewed and are negative.     PHYSICAL EXAM:   VS:  BP 124/84   Pulse 67   Ht 5\' 10"  (1.778 m)   Wt 166 lb 8 oz (75.5 kg)   BMI 23.89 kg/m    GEN: Well nourished, well developed, in no acute distress  HEENT: normal  Neck: no JVD, carotid bruits, or masses Cardiac: RRR; no murmurs, rubs, or gallops,no edema  Respiratory:  clear to auscultation bilaterally, normal work of breathing GI: soft, nontender, nondistended, + BS MS: no deformity or atrophy  Skin: warm and dry, no rash Neuro:  Alert and Oriented x 3, Strength and sensation are intact Psych: euthymic mood, full affect   Wt Readings from Last 3 Encounters:  11/05/17 166 lb 8 oz (75.5 kg)  07/16/17 167 lb (75.8 kg)  01/16/17 182 lb (82.6 kg)      Studies/Labs Reviewed:   EKG:  None today.--ST elevation anteriorly previously personally viewed  Recent Labs: 07/17/2017: ALT 24; BUN 12; Creatinine, Ser 0.87; Hemoglobin 13.9; Platelets 152; Potassium 4.3; Sodium 140   Lipid Panel    Component Value Date/Time   CHOL 111 07/17/2017 1449   TRIG 94 07/17/2017 1449   HDL 63 07/17/2017 1449   CHOLHDL 1.8 07/17/2017 1449   CHOLHDL 2.3 09/28/2016 0537   VLDL 37 09/28/2016 0537   LDLCALC 29 07/17/2017 1449    Additional studies/ records that were reviewed today include:   ECHO 10/06/16 - Left ventricle: Wall thickness was increased in a pattern of   moderate LVH. Systolic function was moderately to severely   reduced.  The estimated ejection fraction was in the range of 30%   to 35%. Akinesis of the basal-midanteroseptal and apical   myocardium. Doppler parameters are consistent with abnormal left   ventricular relaxation (grade 1 diastolic dysfunction).  ECHO 01/11/17: - Compared to a prior study in 10/2016, the LVEF has improved to   45-50%. There are persistent distal anterior, anteroseptal,   apical and inferoapical wall motion abnormalities consistent with   LAD  territory Warehouse manager.  ASSESSMENT:    1. Coronary artery disease involving native coronary artery of native heart, angina presence unspecified   2. Chronic systolic heart failure ([place])   3. Ischemic heart disease   4. Tobacco abuse   5. Pure hypercholesterolemia      PLAN:  In order of problems listed above:  Anterior STEMI with PCI to the LAD 2009 and 09/27/2016, CAD with angina  - D2 jailed and was treated with angioplasty   - Imdur 30 mg a day-Changes made Continue DAPT - may need indefinitely.  I would like to to decrease Brilinta to 60 mg twice a day but he does not wish for me to send in a new prescription because he has many tablets of Brilinta already.  He will call us and then at that time we will make the change to 60 twice a day.  We could also consider changing him to Plavix 75 mg once a day.  Chronic systolic heart failure  - EF has improved to 45-50% post revasc and med titration  - NYHA class 1-2 currently  - Carvedilol 12.5 mg twice a day.  -Was unable to take losartan 50 because of hypotension.   Ongoing tobacco abuse  - Chantix, tried. Gives him nausea when he tries to smoke. Will Rx again.   HTN  - BP better today, Stable. No changes made.  Doing very well.  HLD  - on  high intensity statin therapy  - LDL 29.  ALT normal.  - Stable, no changes.  GERD  -Prevacid. Stable.  Difficult for him to discern whether this is angina or not.  Lori 6, Me 12  Medication Adjustments/Labs and Tests  Ordered: Current medicines are reviewed at length with the patient today.  Concerns regarding medicines are outlined above.  Medication changes, Labs and Tests ordered today are listed in the Patient Instructions below. Patient Instructions  Medication Instructions:  1) you have been given a prescription for CHANTIX. Please take as directed. 2) Please call when you are running out of [place]. We will call in a lower dose - Brilinta 30 mg tablets at that time.  Labwork: None  Testing/Procedures: None  Follow-Up: Your provider wants you to follow-up in: 6 months with Jeremiah Merle, NP. You will receive a reminder letter in the mail two months in advance. If you don't receive a letter, please call our office to schedule the follow-up appointment.    Your provider wants you to follow-up in: 1 year with Dr. [name]. You will receive a reminder letter in the mail two months in advance. If you don't receive a letter, please call our office to schedule the follow-up appointment.    Any Other Special Instructions Will Be Listed Below (If Applicable).     If you need a refill on your cardiac medications before your next appointment, please call your pharmacy.      Signed, Candee Furbish, MD  11/05/2017 8:28 AM    [facility] [place], [place], [place]  [address] Phone: [phone]; Fax: [phone]

## 2017-12-01 ENCOUNTER — Other Ambulatory Visit: Payer: Self-pay | Admitting: Nurse Practitioner

## 2017-12-11 ENCOUNTER — Telehealth: Payer: Self-pay | Admitting: Cardiology

## 2017-12-11 NOTE — Telephone Encounter (Signed)
NEW MESSAGE    Pt c/o medication issue:  1. Name of Medication: BRILINTA 90 MG TABS tablet 2. How are you currently taking this medication (dosage and times per day)? n/a  3. Are you having a reaction (difficulty breathing--STAT)? n/a  4. What is your medication issue? Patient calling to request dosage to change from 90 MG to 50MG 

## 2017-12-11 NOTE — Telephone Encounter (Signed)
In review of the chart, Dr [name] office visit note from 11/05/2017 states to decrease to 60 mg BID however d/c instructions from that day state 30 mg BID.  Will clarify with Dr [name] and call pt back.

## 2017-12-12 MED ORDER — CLOPIDOGREL BISULFATE 75 MG PO TABS: 75.0000 mg | ORAL_TABLET | Freq: Every day | ORAL | 3 refills | Status: DC

## 2017-12-12 NOTE — Telephone Encounter (Signed)
Pt aware of medication change.  RX sent into [facility] as requested.  He will c/b if any issue or concern with cost.

## 2017-12-12 NOTE — Telephone Encounter (Signed)
For reduction in cost, I would be fine to stop Brilinta and start Plavix 75mg  PO QD. (Continue DAPT) Candee Furbish, MD

## 2017-12-22 ENCOUNTER — Other Ambulatory Visit: Payer: Self-pay | Admitting: Nurse Practitioner

## 2017-12-24 ENCOUNTER — Other Ambulatory Visit: Payer: Self-pay | Admitting: Nurse Practitioner

## 2018-01-18 ENCOUNTER — Other Ambulatory Visit: Payer: Self-pay | Admitting: Cardiology

## 2018-04-02 ENCOUNTER — Other Ambulatory Visit: Payer: Self-pay | Admitting: Nurse Practitioner

## 2018-04-02 NOTE — Telephone Encounter (Signed)
If the patient has been taking - would continue as directed.

## 2018-04-02 NOTE — Telephone Encounter (Signed)
Requested medication, losartan, not listed on patients current med list. I called and spoke with patient and he stated that he has been taking it and was unaware if he was instructed to discontinue it. Patient would like to know if he should still be taking? Please advise. Thanks, MI

## 2018-04-02 NOTE — Telephone Encounter (Signed)
Spoke with patient and made him aware that I would send in the losartan as requested. He verbalized his understanding and appreciation.

## 2018-05-02 ENCOUNTER — Encounter: Payer: Self-pay | Admitting: Family Medicine

## 2018-05-02 ENCOUNTER — Ambulatory Visit (INDEPENDENT_AMBULATORY_CARE_PROVIDER_SITE_OTHER): Payer: 59 | Admitting: Family Medicine

## 2018-05-02 DIAGNOSIS — F339 Major depressive disorder, recurrent, unspecified: Secondary | ICD-10-CM

## 2018-05-02 DIAGNOSIS — F17210 Nicotine dependence, cigarettes, uncomplicated: Secondary | ICD-10-CM | POA: Insufficient documentation

## 2018-05-02 MED ORDER — BUPROPION HCL ER (SR) 150 MG PO TB12: 150.0000 mg | ORAL_TABLET | Freq: Two times a day (BID) | ORAL | 3 refills | Status: DC

## 2018-05-02 NOTE — Progress Notes (Signed)
Jeremiah Short - 57 y.o. male MRN [id number]  Date of birth: 1961-06-17  Subjective Chief Complaint  Patient presents with  . Establish Care    would like to discuss depression/anxiety    HPI Jeremiah Short is a 57 y.o. male with history of depression with anxiety, CAD s/p antterior STEMI with stenting to LAD in 2017,  HLD, and nicotine dependence here today to establish care with new PCP.  He is followed by cardiology for CAD.  He is being seen today for complaint of depression and anxiety.  Has history of depression and anxiety but has never been on medication for management of this.  He notes that being in large crowds is as major stressor for him and that he has trouble hearing the person he is talking to when he is around a large crowd.  He does have his hearing checked yearly and was told he had some mild hearing loss.  He also reports increased anhedonia.  He recently purchased a Film/video editor but has no desire to go out and use it, he just feels like sitting at home.  He does smoke as an outlet for his anxiety and would like to quit. Has been able to cut back on Chantix but increased dose makes him nauseous.  He has been able to quit with bupropion in the past.     Depression screen [facility] 2/9 05/02/2018 05/02/2018  Decreased Interest 3 2  Down, Depressed, Hopeless 3 2  PHQ - 2 Score 6 4  Altered sleeping 1 -  Tired, decreased energy 2 -  Change in appetite 2 -  Feeling bad or failure about yourself  3 -  Trouble concentrating 2 -  Moving slowly or fidgety/restless 0 -  Suicidal thoughts 0 -  PHQ-9 Score 16 -   GAD 7 : Generalized Anxiety Score 05/02/2018  Nervous, Anxious, on Edge 3  Control/stop worrying 1  Worry too much - different things 1  Trouble relaxing 2  Restless 1  Easily annoyed or irritable 2  Afraid - awful might happen 0  Total GAD 7 Score 10    ROS:  A comprehensive ROS was completed and negative except as noted per HPI  Allergies  Allergen Reactions  . Morphine And  Related     dont remember the symptoms    Past Medical History:  Diagnosis Date  . Anxiety   . Cardiomyopathy in other diseases classified elsewhere   . Coronary artery disease   . Depression   . Diverticulosis   . Esophageal stricture   . GERD (gastroesophageal reflux disease)   . History of ETOH abuse   . Hypertension   . Myocardial infarction ([place]) 2009  . Pure hypercholesterolemia   . Smoker   . Stented coronary artery   . Tubular adenoma of colon 2012  . Wears glasses     Past Surgical History:  Procedure Laterality Date  . APPENDECTOMY  2011   during colectomy  . CARDIAC CATHETERIZATION  2009   placed 2 stents  . CARDIAC CATHETERIZATION N/A 09/27/2016   Procedure: Left Heart Cath and Coronary Angiography;  Surgeon: [name], MD;  Location: [facility];  Service: Cardiovascular;  Laterality: N/A;  . CARDIAC CATHETERIZATION N/A 09/27/2016   Procedure: Coronary Stent Intervention;  Surgeon: [name], MD;  Location: [facility];  Service: Cardiovascular;  Laterality: N/A;  . CORONARY STENT PLACEMENT    . HERNIA REPAIR    . INGUINAL HERNIA REPAIR Left 09/16/2013  Procedure: HERNIA REPAIR INGUINAL ADULT;  Surgeon: [name], MD;  Location: [facility];  Service: General;  Laterality: Left;  . MOUTH SURGERY    . PARTIAL COLECTOMY  2011   sigmoid  . TONSILLECTOMY    . WISDOM TOOTH EXTRACTION      Social History   Socioeconomic History  . Marital status: Divorced    Spouse name: Not on file  . Number of children: 2  . Years of education: Not on file  . Highest education level: Not on file  Occupational History  . Occupation: Glass blower/designer  Social Needs  . Financial resource strain: Not on file  . Food insecurity:    Worry: Not on file    Inability: Not on file  . Transportation needs:    Medical: Not on file    Non-medical: Not on file  Tobacco Use  . Smoking status: Current Every Day Smoker    Packs/day: 0.75     Years: 40.00    Pack years: 30.00    Types: E-cigarettes, Cigarettes  . Smokeless tobacco: Never Used  . Tobacco comment: Pt given handout to quit smoking  Substance and Sexual Activity  . Alcohol use: Yes    Alcohol/week: 54.0 oz    Types: 90 Shots of liquor per week    Comment: 1/5 vodka every 3 days (750 ml)  . Drug use: No  . Sexual activity: Not on file  Lifestyle  . Physical activity:    Days per week: Not on file    Minutes per session: Not on file  . Stress: Not on file  Relationships  . Social connections:    Talks on phone: Not on file    Gets together: Not on file    Attends religious service: Not on file    Active member of club or organization: Not on file    Attends meetings of clubs or organizations: Not on file    Relationship status: Not on file  Other Topics Concern  . Not on file  Social History Narrative  . Not on file    Family History  Problem Relation Age of Onset  . Lung cancer Mother   . Heart disease Father        has pacemaker  . Breast cancer Sister   . Colon cancer Neg Hx   . Colon polyps Neg Hx   . Esophageal cancer Neg Hx   . Kidney disease Neg Hx   . Diabetes Neg Hx     Health Maintenance  Topic Date Due  . Hepatitis C Screening  [birth year]  . HIV Screening  01/28/1975  . COLONOSCOPY  09/29/2017  . INFLUENZA VACCINE  05/02/2018  . TETANUS/TDAP  09/06/2024    ----------------------------------------------------------------------------------------------------------------------------------------------------------------------------------------------------------------- Physical Exam BP 114/70 (BP Location: Left Arm, Patient Position: Sitting, Cuff Size: Normal)   Pulse 86   Temp 98.4 F (36.9 C) (Oral)   Ht 5\' 10"  (1.778 m)   Wt 159 lb 6.4 oz (72.3 kg)   SpO2 96%   BMI 22.87 kg/m   Physical Exam  Constitutional: He is oriented to person, place, and time. He appears well-nourished. No distress.  HENT:  Head: Normocephalic  and atraumatic.  Neurological: He is alert and oriented to person, place, and time.  Psychiatric: He has a normal mood and affect. His behavior is normal. Judgment and thought content normal.  Additional components of exam deferred today.   ------------------------------------------------------------------------------------------------------------------------------------------------------------------------------------------------------------------- Assessment and Plan  Depression, recurrent ([place]) -Significant symptoms  of depression with some anxiety as well.  -Recommend trial of wellbutrin given good tolerance and success with quitting smoking using this previously.   -We discussed seeing a therapist as well, he would like to see how medication is working initially and we'll discuss again at f/u -F/u in 4-5 weeks.   Nicotine dependence, cigarettes, uncomplicated -Counseled on smoking cessation -Interested in quitting -Has had some success with chantix but causing nausea.  Probably not the best option given depression as well.  -Successful with wellbutrin previously, will restart.  I think this will be helpful for depression as well.  -F/u 4-5 weeks.    >45 minutes spent with patient with >50% of time spent counseling patient regarding recommendations for treatment of depression and strategies to quit smoking as outlined above.

## 2018-05-02 NOTE — Assessment & Plan Note (Signed)
-  Counseled on smoking cessation -Interested in quitting -Has had some success with chantix but causing nausea.  Probably not the best option given depression as well.  -Successful with wellbutrin previously, will restart.  I think this will be helpful for depression as well.  -F/u 4-5 weeks.

## 2018-05-02 NOTE — Assessment & Plan Note (Signed)
-  Significant symptoms of depression with some anxiety as well.  -Recommend trial of wellbutrin given good tolerance and success with quitting smoking using this previously.   -We discussed seeing a therapist as well, he would like to see how medication is working initially and we'll discuss again at f/u -F/u in 4-5 weeks.

## 2018-05-02 NOTE — Patient Instructions (Signed)
Major Depressive Disorder, Adult Major depressive disorder (MDD) is a mental health condition. It may also be called clinical depression or unipolar depression. MDD usually causes feelings of sadness, hopelessness, or helplessness. MDD can also cause physical symptoms. It can interfere with work, school, relationships, and other everyday activities. MDD may be mild, moderate, or severe. It may occur once (single episode major depressive disorder) or it may occur multiple times (recurrent major depressive disorder). What are the causes? The exact cause of this condition is not known. MDD is most likely caused by a combination of things, which may include:  Genetic factors. These are traits that are passed along from parent to child.  Individual factors. Your personality, your behavior, and the way you handle your thoughts and feelings may contribute to MDD. This includes personality traits and behaviors learned from others.  Physical factors, such as: ? Differences in the part of your brain that controls emotion. This part of your brain may be different than it is in people who do not have MDD. ? Long-term (chronic) medical or psychiatric illnesses.  Social factors. Traumatic experiences or major life changes may play a role in the development of MDD.  What increases the risk? This condition is more likely to develop in women. The following factors may also make you more likely to develop MDD:  A family history of depression.  Troubled family relationships.  Abnormally low levels of certain brain chemicals.  Traumatic events in childhood, especially abuse or the loss of a parent.  Being under a lot of stress, or long-term stress, especially from upsetting life experiences or losses.  A history of: ? Chronic physical illness. ? Other mental health disorders. ? Substance abuse.  Poor living conditions.  Experiencing social exclusion or discrimination on a regular basis.  What are  the signs or symptoms? The main symptoms of MDD typically include:  Constant depressed or irritable mood.  Loss of interest in things and activities.  MDD symptoms may also include:  Sleeping or eating too much or too little.  Unexplained weight change.  Fatigue or low energy.  Feelings of worthlessness or guilt.  Difficulty thinking clearly or making decisions.  Thoughts of suicide or of harming others.  Physical agitation or weakness.  Isolation.  Severe cases of MDD may also occur with other symptoms, such as:  Delusions or hallucinations, in which you imagine things that are not real (psychotic depression).  Low-level depression that lasts at least a year (chronic depression or persistent depressive disorder).  Extreme sadness and hopelessness (melancholic depression).  Trouble speaking and moving (catatonic depression).  How is this diagnosed? This condition may be diagnosed based on:  Your symptoms.  Your medical history, including your mental health history. This may involve tests to evaluate your mental health. You may be asked questions about your lifestyle, including any drug and alcohol use, and how long you have had symptoms of MDD.  A physical exam.  Blood tests to rule out other conditions.  You must have a depressed mood and at least four other MDD symptoms most of the day, nearly every day in the same 2-week timeframe before your health care provider can confirm a diagnosis of MDD. How is this treated? This condition is usually treated by mental health professionals, such as psychologists, psychiatrists, and clinical social workers. You may need more than one type of treatment. Treatment may include:  Psychotherapy. This is also called talk therapy or counseling. Types of psychotherapy include: ? Cognitive behavioral   therapy (CBT). This type of therapy teaches you to recognize unhealthy feelings, thoughts, and behaviors, and replace them with  positive thoughts and actions. ? Interpersonal therapy (IPT). This helps you to improve the way you relate to and communicate with others. ? Family therapy. This treatment includes members of your family.  Medicine to treat anxiety and depression, or to help you control certain emotions and behaviors.  Lifestyle changes, such as: ? Limiting alcohol and drug use. ? Exercising regularly. ? Getting plenty of sleep. ? Making healthy eating choices. ? Spending more time outdoors.  Treatments involving stimulation of the brain can be used in situations with extremely severe symptoms, or when medicine or other therapies do not work over time. These treatments include electroconvulsive therapy, transcranial magnetic stimulation, and vagal nerve stimulation. Follow these instructions at home: Activity  Return to your normal activities as told by your health care provider.  Exercise regularly and spend time outdoors as told by your health care provider. General instructions  Take over-the-counter and prescription medicines only as told by your health care provider.  Do not drink alcohol. If you drink alcohol, limit your alcohol intake to no more than 1 drink a day for nonpregnant women and 2 drinks a day for men. One drink equals 12 oz of beer, 5 oz of wine, or 1 oz of hard liquor. Alcohol can affect any antidepressant medicines you are taking. Talk to your health care provider about your alcohol use.  Eat a healthy diet and get plenty of sleep.  Find activities that you enjoy doing, and make time to do them.  Consider joining a support group. Your health care provider may be able to recommend a support group.  Keep all follow-up visits as told by your health care provider. This is important. Where to find more information: National Alliance on Mental Illness  www.nami.org  U.S. National Institute of Mental Health  www.nimh.nih.gov  National Suicide Prevention  Lifeline  1-800-273-TALK (8255). This is free, 24-hour help.  Contact a health care provider if:  Your symptoms get worse.  You develop new symptoms. Get help right away if:  You self-harm.  You have serious thoughts about hurting yourself or others.  You see, hear, taste, smell, or feel things that are not present (hallucinate). This information is not intended to replace advice given to you by your health care provider. Make sure you discuss any questions you have with your health care provider. Document Released: 01/13/2013 Document Revised: 05/25/2016 Document Reviewed: 03/29/2016 Elsevier Interactive Patient Education  2018 Elsevier Inc.  

## 2018-05-06 ENCOUNTER — Encounter: Payer: Self-pay | Admitting: Family Medicine

## 2018-05-06 ENCOUNTER — Ambulatory Visit (INDEPENDENT_AMBULATORY_CARE_PROVIDER_SITE_OTHER): Payer: 59 | Admitting: Family Medicine

## 2018-05-06 VITALS — BP 110/80 | HR 86 | Temp 99.0°F | Ht 70.0 in | Wt 160.2 lb

## 2018-05-06 DIAGNOSIS — I2510 Atherosclerotic heart disease of native coronary artery without angina pectoris: Secondary | ICD-10-CM | POA: Diagnosis not present

## 2018-05-06 DIAGNOSIS — Z1159 Encounter for screening for other viral diseases: Secondary | ICD-10-CM | POA: Diagnosis not present

## 2018-05-06 DIAGNOSIS — Z0000 Encounter for general adult medical examination without abnormal findings: Secondary | ICD-10-CM

## 2018-05-06 DIAGNOSIS — Z0001 Encounter for general adult medical examination with abnormal findings: Secondary | ICD-10-CM | POA: Diagnosis not present

## 2018-05-06 DIAGNOSIS — Z125 Encounter for screening for malignant neoplasm of prostate: Secondary | ICD-10-CM

## 2018-05-06 DIAGNOSIS — M545 Low back pain, unspecified: Secondary | ICD-10-CM

## 2018-05-06 LAB — TSH: TSH: 1.4 u[IU]/mL (ref 0.35–4.50)

## 2018-05-06 LAB — COMPREHENSIVE METABOLIC PANEL
ALBUMIN: 4 g/dL (ref 3.5–5.2)
ALT: 19 U/L (ref 0–53)
AST: 30 U/L (ref 0–37)
Alkaline Phosphatase: 53 U/L (ref 39–117)
BUN: 18 mg/dL (ref 6–23)
CHLORIDE: 108 meq/L (ref 96–112)
CO2: 30 mEq/L (ref 19–32)
CREATININE: 1.24 mg/dL (ref 0.40–1.50)
Calcium: 9.2 mg/dL (ref 8.4–10.5)
GFR: 63.58 mL/min (ref >60.00)
Glucose, Bld: 95 mg/dL (ref 70–99)
Potassium: 6 mEq/L — ABNORMAL HIGH (ref 3.5–5.1)
SODIUM: 145 meq/L (ref 135–145)
TOTAL PROTEIN: 6.1 g/dL (ref 6.0–8.3)
Total Bilirubin: 0.7 mg/dL (ref 0.2–1.2)

## 2018-05-06 LAB — CBC
HEMATOCRIT: 41.6 % (ref 39.0–52.0)
Hemoglobin: 14.2 g/dL (ref 13.0–17.0)
MCHC: 34.1 g/dL (ref 30.0–36.0)
MCV: 102.5 fl — ABNORMAL HIGH (ref 78.0–100.0)
Platelets: 138 10*3/uL — ABNORMAL LOW (ref 150.0–400.0)
RBC: 4.06 Mil/uL — ABNORMAL LOW (ref 4.22–5.81)
RDW: 13.7 % (ref 11.5–15.5)
WBC: 4.5 10*3/uL (ref 4.0–10.5)

## 2018-05-06 LAB — LIPID PANEL
Cholesterol: 98 mg/dL (ref 0–200)
HDL: 51.8 mg/dL (ref >39.00)
NonHDL: 46.22
Total CHOL/HDL Ratio: 2
Triglycerides: 287 mg/dL — ABNORMAL HIGH (ref 0.0–149.0)
VLDL: 57.4 mg/dL — ABNORMAL HIGH (ref 0.0–40.0)

## 2018-05-06 LAB — LDL CHOLESTEROL, DIRECT: LDL DIRECT: 15 mg/dL

## 2018-05-06 LAB — PSA: PSA: 0.77 ng/mL (ref 0.10–4.00)

## 2018-05-06 MED ORDER — METHYLPREDNISOLONE 4 MG PO TBPK: ORAL_TABLET | ORAL | 0 refills | Status: DC

## 2018-05-06 MED ORDER — CYCLOBENZAPRINE HCL 10 MG PO TABS: 10.0000 mg | ORAL_TABLET | Freq: Three times a day (TID) | ORAL | 0 refills | Status: DC | PRN

## 2018-05-06 NOTE — Assessment & Plan Note (Signed)
Well adult, back pain addressed today as well.  Orders Placed This Encounter  Procedures  . Comp Met (CMET)  . CBC  . TSH  . Lipid Profile  . PSA  . Hepatitis C Antibody  Immunizations: Up to date Screening:  PSA and Hep C ordered Anticipatory Guidance/Risk factor reduction:  Counseled on smoking cessation.  Additional recommendations per AVS.

## 2018-05-06 NOTE — Assessment & Plan Note (Signed)
Rx for medrol dosepak and flexeril Discussed home stretches, may use heat/ice as needed for comfort.

## 2018-05-06 NOTE — Patient Instructions (Signed)

## 2018-05-06 NOTE — Progress Notes (Signed)
Jeremiah Short - 57 y.o. male MRN [id number]  Date of birth: 05/05/1961  Subjective Chief Complaint  Patient presents with  . Annual Exam    CPE with fasting, back is hurting since this weekend.    HPI Jeremiah Short is a 57 y.o. male here today for annual exam.  He also has complaint of back pain.  Reports that back pain started over the weekend.  Has had this in the past, typically occurs a 2-3x per year.  Has been treated with opioid analgesics and muscle relaxers previously.  He also recalls taking steroids in the past and tells me that this worked the best for him.  He denies radiation of pain, numbness, tingling or weakness.   He has no other concerns today.  He continues to smoke and is hopeful that bupropion will be helpful for this.    Review of Systems  Constitutional: Negative for chills, fever, malaise/fatigue and weight loss.  HENT: Negative for congestion, ear pain and sore throat.   Eyes: Negative for blurred vision, double vision and pain.  Respiratory: Negative for cough and shortness of breath.   Cardiovascular: Negative for chest pain and palpitations.  Gastrointestinal: Negative for abdominal pain, blood in stool, constipation, heartburn and nausea.  Genitourinary: Negative for dysuria and urgency.  Musculoskeletal: Positive for back pain. Negative for joint pain and myalgias.  Neurological: Negative for dizziness and headaches.  Endo/Heme/Allergies: Does not bruise/bleed easily.  Psychiatric/Behavioral: Positive for depression. The patient is nervous/anxious. The patient does not have insomnia.     Allergies  Allergen Reactions  . Morphine And Related     dont remember the symptoms    Past Medical History:  Diagnosis Date  . Anxiety   . Cardiomyopathy in other diseases classified elsewhere   . Coronary artery disease   . Depression   . Diverticulosis   . Esophageal stricture   . GERD (gastroesophageal reflux disease)   . History of ETOH abuse   .  Hypertension   . Myocardial infarction ([place]) 2009  . Pure hypercholesterolemia   . Smoker   . Stented coronary artery   . Tubular adenoma of colon 2012  . Wears glasses     Past Surgical History:  Procedure Laterality Date  . APPENDECTOMY  2011   during colectomy  . CARDIAC CATHETERIZATION  2009   placed 2 stents  . CARDIAC CATHETERIZATION N/A 09/27/2016   Procedure: Left Heart Cath and Coronary Angiography;  Surgeon: [name], MD;  Location: [facility];  Service: Cardiovascular;  Laterality: N/A;  . CARDIAC CATHETERIZATION N/A 09/27/2016   Procedure: Coronary Stent Intervention;  Surgeon: [name], MD;  Location: [facility];  Service: Cardiovascular;  Laterality: N/A;  . CORONARY STENT PLACEMENT    . HERNIA REPAIR    . INGUINAL HERNIA REPAIR Left 09/16/2013   Procedure: HERNIA REPAIR INGUINAL ADULT;  Surgeon: [name], MD;  Location: [facility];  Service: General;  Laterality: Left;  . MOUTH SURGERY    . PARTIAL COLECTOMY  2011   sigmoid  . TONSILLECTOMY    . WISDOM TOOTH EXTRACTION      Social History   Socioeconomic History  . Marital status: Divorced    Spouse name: Not on file  . Number of children: 2  . Years of education: Not on file  . Highest education level: Not on file  Occupational History  . Occupation: Glass blower/designer  Social Needs  . Financial resource strain: Not on  file  . Food insecurity:    Worry: Not on file    Inability: Not on file  . Transportation needs:    Medical: Not on file    Non-medical: Not on file  Tobacco Use  . Smoking status: Current Every Day Smoker    Packs/day: 0.75    Years: 40.00    Pack years: 30.00    Types: E-cigarettes, Cigarettes  . Smokeless tobacco: Never Used  . Tobacco comment: Pt given handout to quit smoking  Substance and Sexual Activity  . Alcohol use: Yes    Alcohol/week: 54.0 oz    Types: 90 Shots of liquor per week    Comment: 1/5 vodka every 3 days (750 ml)    . Drug use: No  . Sexual activity: Not on file  Lifestyle  . Physical activity:    Days per week: Not on file    Minutes per session: Not on file  . Stress: Not on file  Relationships  . Social connections:    Talks on phone: Not on file    Gets together: Not on file    Attends religious service: Not on file    Active member of club or organization: Not on file    Attends meetings of clubs or organizations: Not on file    Relationship status: Not on file  Other Topics Concern  . Not on file  Social History Narrative  . Not on file    Family History  Problem Relation Age of Onset  . Lung cancer Mother   . Heart disease Father        has pacemaker  . Breast cancer Sister   . Colon cancer Neg Hx   . Colon polyps Neg Hx   . Esophageal cancer Neg Hx   . Kidney disease Neg Hx   . Diabetes Neg Hx     Health Maintenance  Topic Date Due  . Hepatitis C Screening  01/15/1961  . HIV Screening  01/28/1975  . INFLUENZA VACCINE  05/02/2018  . COLONOSCOPY  09/30/2019  . TETANUS/TDAP  09/06/2024    ----------------------------------------------------------------------------------------------------------------------------------------------------------------------------------------------------------------- Physical Exam BP 110/80 (BP Location: Left Arm, Patient Position: Sitting, Cuff Size: Normal)   Pulse 86   Temp 99 F (37.2 C) (Oral)   Ht _0  (1.778 m)   Wt 160 lb 3.2 oz (72.7 kg)   SpO2 97%   BMI 22.99 kg/m   Physical Exam  Constitutional: He is oriented to person, place, and time. He appears well-nourished. No distress.  HENT:  Head: Normocephalic and atraumatic.  Mouth/Throat: Oropharynx is clear and moist.  Eyes: No scleral icterus.  Neck: Neck supple. No thyromegaly present.  Cardiovascular: Normal rate, regular rhythm, normal heart sounds and intact distal pulses.  Pulmonary/Chest: Effort normal and breath sounds normal.  Abdominal: Soft. Bowel sounds are  normal.  Genitourinary:  Genitourinary Comments: Declines DRE  Musculoskeletal: Normal range of motion. He exhibits no edema or tenderness.  Lymphadenopathy:    He has no cervical adenopathy.  Neurological: He is alert and oriented to person, place, and time. No cranial nerve deficit. Coordination normal.  Skin: Skin is warm and dry. No rash noted.  Psychiatric: He has a normal mood and affect. His behavior is normal.    ------------------------------------------------------------------------------------------------------------------------------------------------------------------------------------------------------------------- Assessment and Plan  Well adult exam Well adult, back pain addressed today as well.  Orders Placed This Encounter  Procedures  . Comp Met (CMET)  . CBC  . TSH  . Lipid Profile  .  PSA  . Hepatitis C Antibody  Immunizations: Up to date Screening:  PSA and Hep C ordered Anticipatory Guidance/Risk factor reduction:  Counseled on smoking cessation.  Additional recommendations per AVS.   Acute low back pain Rx for medrol dosepak and flexeril Discussed home stretches, may use heat/ice as needed for comfort.

## 2018-05-07 ENCOUNTER — Other Ambulatory Visit: Payer: Self-pay | Admitting: Family Medicine

## 2018-05-07 ENCOUNTER — Encounter: Payer: 59 | Admitting: Family Medicine

## 2018-05-07 DIAGNOSIS — E875 Hyperkalemia: Secondary | ICD-10-CM

## 2018-05-07 LAB — HEPATITIS C ANTIBODY
Hepatitis C Ab: NONREACTIVE
SIGNAL TO CUT-OFF: 0.08 (ref <1.00)

## 2018-05-07 NOTE — Progress Notes (Signed)
-  Potassium returned elevated, please have him stop back in to have this rechecked.  Orders entered.  -TG are high.  Recommend low fat diet.

## 2018-05-13 ENCOUNTER — Ambulatory Visit (INDEPENDENT_AMBULATORY_CARE_PROVIDER_SITE_OTHER): Payer: 59 | Admitting: Nurse Practitioner

## 2018-05-13 ENCOUNTER — Telehealth: Payer: Self-pay | Admitting: *Deleted

## 2018-05-13 ENCOUNTER — Encounter: Payer: Self-pay | Admitting: Nurse Practitioner

## 2018-05-13 VITALS — BP 118/78 | HR 100 | Ht 70.0 in | Wt 157.8 lb

## 2018-05-13 DIAGNOSIS — R0789 Other chest pain: Secondary | ICD-10-CM | POA: Diagnosis not present

## 2018-05-13 DIAGNOSIS — E875 Hyperkalemia: Secondary | ICD-10-CM | POA: Diagnosis not present

## 2018-05-13 DIAGNOSIS — I2510 Atherosclerotic heart disease of native coronary artery without angina pectoris: Secondary | ICD-10-CM | POA: Diagnosis not present

## 2018-05-13 LAB — BASIC METABOLIC PANEL
BUN/Creatinine Ratio: 23 — ABNORMAL HIGH (ref 9–20)
BUN: 22 mg/dL (ref 6–24)
CO2: 24 mmol/L (ref 20–29)
Calcium: 8.9 mg/dL (ref 8.7–10.2)
Chloride: 101 mmol/L (ref 96–106)
Creatinine, Ser: 0.97 mg/dL (ref 0.76–1.27)
GFR calc Af Amer: 99 mL/min/{1.73_m2} (ref >59)
GFR calc non Af Amer: 86 mL/min/{1.73_m2} (ref >59)
Glucose: 78 mg/dL (ref 65–99)
Potassium: 4.5 mmol/L (ref 3.5–5.2)
Sodium: 140 mmol/L (ref 134–144)

## 2018-05-13 MED ORDER — PANTOPRAZOLE SODIUM 40 MG PO TBEC: 40.0000 mg | DELAYED_RELEASE_TABLET | Freq: Every day | ORAL | 3 refills | Status: DC

## 2018-05-13 MED ORDER — ISOSORBIDE MONONITRATE ER 60 MG PO TB24: 60.0000 mg | ORAL_TABLET | Freq: Every day | ORAL | 3 refills | Status: DC

## 2018-05-13 MED ORDER — PANTOPRAZOLE SODIUM 40 MG PO TBEC: 40.0000 mg | DELAYED_RELEASE_TABLET | Freq: Every day | ORAL | 11 refills | Status: DC

## 2018-05-13 NOTE — Progress Notes (Signed)
CARDIOLOGY OFFICE NOTE  Date:  05/13/2018    Jeremiah Short Date of Birth: Feb 09, 1961 Medical Record #[id number]  PCP:  Luetta Nutting, DO  Cardiologist:  Marisa Cyphers    No chief complaint on file.   History of Present Illness: Jeremiah Short is a 57 y.o. male who presents today for a 6 month check. Seen for Dr. [name].   He has a history of CAD with STEMIin 01/23/2008 treated with PCI/ stenting, HTN, HLD, and has ongoing tobacco use. He had prior anterior STEMI 09/2016 with mid LAD occlusion just proximal to the prior placed LAD stent - DES was placed. EF 40% by cath - 30 to 35% by echo which improved to 40 to 45% by echo in 12/2016.   He has had been seen several times - atypical chest pain. His symptoms are hard to sort out.   Last seen by Dr. [name] in February - had had some chest pain prior to that visit. Still smoking - stays on chronic Chantix. He has subsequently been switched over to Plavix.   Comes in today. Here alone. 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 He notes that he has continued to have several episodes of chest pain - typically at night - reminds him of "a mild heart attack". Described as a heavy tightness. But also with a "bad taste" in his mouth - he has had prior esophageal dilatation. He has used NTG 2 to 3 times - most recently about 2 weeks ago. He continues to smoke. BP is ok. Not dizzy. Recent labs with PCP noted - his potassium was elevated. He denies excessive foods in potassium or supplements. He is only using his Prevacid occasionally.   Past Medical History:  Diagnosis Date  . Anxiety   . Cardiomyopathy in other diseases classified elsewhere   . Coronary artery disease   . Depression   . Diverticulosis   . Esophageal stricture   . GERD (gastroesophageal reflux disease)   . History of ETOH abuse   . Hypertension   . Myocardial infarction ([place]) 2009  . Pure hypercholesterolemia   . Smoker   . Stented coronary  artery   . Tubular adenoma of colon 2012  . Wears glasses     Past Surgical History:  Procedure Laterality Date  . APPENDECTOMY  2011   during colectomy  . CARDIAC CATHETERIZATION  2009   placed 2 stents  . CARDIAC CATHETERIZATION N/A 09/27/2016   Procedure: Left Heart Cath and Coronary Angiography;  Surgeon: [name], MD;  Location: [facility];  Service: Cardiovascular;  Laterality: N/A;  . CARDIAC CATHETERIZATION N/A 09/27/2016   Procedure: Coronary Stent Intervention;  Surgeon: [name], MD;  Location: [facility];  Service: Cardiovascular;  Laterality: N/A;  . CORONARY STENT PLACEMENT    . HERNIA REPAIR    . INGUINAL HERNIA REPAIR Left 09/16/2013   Procedure: HERNIA REPAIR INGUINAL ADULT;  Surgeon: [name], MD;  Location: [facility];  Service: General;  Laterality: Left;  . MOUTH SURGERY    . PARTIAL COLECTOMY  2011   sigmoid  . TONSILLECTOMY    . WISDOM TOOTH EXTRACTION       Medications: Current Meds  Medication Sig  . aspirin EC 81 MG EC tablet Take 1 tablet (81 mg total) by mouth daily.  [name] atorvastatin (LIPITOR) 80 MG tablet TAKE 1 TABLET (80 MG TOTAL) BY MOUTH DAILY AT 6 PM.  .  buPROPion (WELLBUTRIN SR) 150 MG 12 hr tablet Take 1 tablet (150 mg total) by mouth 2 (two) times daily. Take once daily x 3 days then BID  . carvedilol (COREG) 12.5 MG tablet TAKE 1 TABLET BY MOUTH TWICE A DAY  . clopidogrel (PLAVIX) 75 MG tablet Take 1 tablet (75 mg total) by mouth daily.  . cyclobenzaprine (FLEXERIL) 10 MG tablet Take 1 tablet (10 mg total) by mouth 3 (three) times daily as needed for muscle spasms.  [name] losartan (COZAAR) 50 MG tablet Take 1 tablet (50 mg total) by mouth daily.  . nitroGLYCERIN (NITROSTAT) 0.4 MG SL tablet Place 1 tablet (0.4 mg total) under the tongue every 5 (five) minutes x 3 doses as needed for chest pain.  . [DISCONTINUED] lansoprazole (PREVACID) 30 MG capsule Take 1 capsule (30 mg total) by mouth as needed (gerd).       Allergies: Allergies  Allergen Reactions  . Morphine And Related     dont remember the symptoms    Social History: The patient  reports that he has been smoking e-cigarettes and cigarettes. He has a 30.00 pack-year smoking history. He has never used smokeless tobacco. He reports that he drinks about 90.0 standard drinks of alcohol per week. He reports that he does not use drugs.   Family History: The patient's family history includes Breast cancer in his sister; Heart disease in his father; Lung cancer in his mother.   Review of Systems: Please see the history of present illness.   Otherwise, the review of systems is positive for none.   All other systems are reviewed and negative.   Physical Exam: VS:  BP 118/78 (BP Location: Left Arm, Patient Position: Sitting, Cuff Size: Normal)   Pulse 100   Ht 5\' 10"  (1.778 m)   Wt 157 lb 12.8 oz (71.6 kg)   BMI 22.64 kg/m  .  BMI Body mass index is 22.64 kg/m.  Wt Readings from Last 3 Encounters:  05/13/18 157 lb 12.8 oz (71.6 kg)  05/06/18 160 lb 3.2 oz (72.7 kg)  05/02/18 159 lb 6.4 oz (72.3 kg)    General: Thin. Alert and in no acute distress.   HEENT: Normal.  Neck: Supple, no JVD, carotid bruits, or masses noted.  Cardiac: Regular rate and rhythm. No murmurs, rubs, or gallops. No edema.  Respiratory:  Lungs are coarse with normal work of breathing.  GI: Soft and nontender.  MS: No deformity or atrophy. Gait and ROM intact.  Skin: Warm and dry. Color is normal.  Neuro:  Strength and sensation are intact and no gross focal deficits noted.  Psych: Alert, appropriate and with normal affect.   LABORATORY DATA:  EKG:  EKG is ordered today. This demonstrates NSR with prior anterior MI - unchanged.  Lab Results  Component Value Date   WBC 4.5 05/06/2018   HGB 14.2 05/06/2018   HCT 41.6 05/06/2018   PLT 138.0 (L) 05/06/2018   GLUCOSE 95 05/06/2018   CHOL 98 05/06/2018   TRIG 287.0 (H) 05/06/2018   HDL 51.80 05/06/2018    LDLDIRECT 15.0 05/06/2018   LDLCALC 29 07/17/2017   ALT 19 05/06/2018   AST 30 05/06/2018   NA 145 05/06/2018   K 6.0 (H) 05/06/2018   CL 108 05/06/2018   CREATININE 1.24 05/06/2018   BUN 18 05/06/2018   CO2 30 05/06/2018   TSH 1.40 05/06/2018   PSA 0.77 05/06/2018   INR 0.99 09/27/2016     BNP (last 3 results)  No results for input(s): BNP in the last 8760 hours.  ProBNP (last 3 results) No results for input(s): PROBNP in the last 8760 hours.   Other Studies Reviewed Today:  ECHO 01/11/17: - Compared to a prior study in 10/2016, the LVEF has improved to 45-50%. There are persistent distal anterior, anteroseptal, apical and inferoapical wall motion abnormalities consistent with LAD territory infarct/scar.  ECHO 10/06/16 - Left ventricle: Wall thickness was increased in a pattern of moderate LVH. Systolic function was moderately to severely reduced. The estimated ejection fraction was in the range of 30% to 35%. Akinesis of the basal-midanteroseptal and apical myocardium. Doppler parameters are consistent with abnormal left ventricular relaxation (grade 1 diastolic dysfunction).   Coronary angiogram 09/27/2016: LVEF 40%with mid to distal anterior apical and inferoapical akinesis. Mild disease RCA and circumflex. Mid LAD occluded just proximal to the previously placed stent, S/P 3.0 x 22 mm resolute onyx DES, postdilated with 3.5 x 12 [place] at 16 atmospheric, 100% reduced to 0%, D2 stent jailed, balloon antroplasty with 2.0 x 12 mm balloon, 99% reduced to less than 10%, TIMI-3 to TIMI-3 in both vessels.  Recommendation: Patient will need dual antiplatelet therapy for at least one year or longer. Needs compliance reiteration. Smoking cessation already discussed with the patient. Moderation in alcohol use discussed.    Assessment/Plan:  1. CAD with prior anterior STEMI with PCI to the LAD 2009 and again in 09/2016 - the D2 was jailed and treated with  angioplasty. He has been on chronic nitrate therapy. He is on indefinite DAPT with Plavix. He continues with periodic chest pain - hard to discern if cardiac or GI - I am increasing the Imdur to 60 mg and changing PPI to Protonix 40 mg daily until seen back. May need further testing if symptoms persist and/or referral back to GI. Smoking cessation is imperative - he is not ready to stop.   2. Chronic LV systolic HF - last echo with EF of 40 to 45% -  No active symptoms. NYHA I  3. Ongoing tobacco abuse - I do not get the impression that he is ready to stop. Total cessation is encouraged.   4. HTN - BP is fine on his current regimen.   5. HLD - recent labs noted by PCP  6. GERD - changing PPI to Protonix 40 mg a day - would like for him to take until seen back to see if we can eliminate "confusing" symptoms with his chest pain as we try to sort this out.   7. Hyperkalemia - recheck here today.     Current medicines are reviewed with the patient today.  The patient does not have concerns regarding medicines other than what has been noted above.  The following changes have been made:  See above.  Labs/ tests ordered today include:    Orders Placed This Encounter  Procedures  . Basic metabolic panel  . EKG 12-Lead     Disposition:   FU with Dr. [name] in 2 months.    Patient is agreeable to this plan and will call if any problems develop in the interim.   SignedTruitt Merle, NP  05/13/2018 8:36 AM  [facility] [address] [place], [place]  [address] Phone: [phone] Fax: [phone]

## 2018-05-13 NOTE — Patient Instructions (Signed)
We will be checking the following labs today - BMET   Medication Instructions:    Continue with your current medicines. BUT  I am changing the Prevacid to Prontonix 40 mg to take one a day every day - I have sent this to your mail order and a short supply to your local pharmacy.   I am increasing the Imdur to 60 mg a day - you may take 2 of your 30 mg tablets to use up - the RX for the 60 mg has been sent to your pharmacy.     Testing/Procedures To Be Arranged:  N/A  Follow-Up:   See Dr. [name] in about 2 months for a recheck    Other Special Instructions:   Try to stop smoking    If you need a refill on your cardiac medications before your next appointment, please call your pharmacy.   Call the [place] office at [phone] if you have any questions, problems or concerns.

## 2018-05-13 NOTE — Telephone Encounter (Signed)
Jeremiah Short, [facility] @ [phone] and [facility] @ [phone]. Pt does not use mail order, will take off pt's list, t/w tiraa to cancel, stated did not show up on pt's list just the [facility] but will note this on pt's account just in case it show's up later.  S/w Mickel Baas at [employer] [facility] pt will get # [phone] on Protonix and Imdur and next time may request # 90.

## 2018-05-30 ENCOUNTER — Encounter: Payer: Self-pay | Admitting: Family Medicine

## 2018-05-30 ENCOUNTER — Ambulatory Visit (INDEPENDENT_AMBULATORY_CARE_PROVIDER_SITE_OTHER): Payer: 59 | Admitting: Family Medicine

## 2018-05-30 VITALS — BP 122/92 | HR 92 | Wt 160.0 lb

## 2018-05-30 DIAGNOSIS — F339 Major depressive disorder, recurrent, unspecified: Secondary | ICD-10-CM

## 2018-05-30 DIAGNOSIS — L821 Other seborrheic keratosis: Secondary | ICD-10-CM | POA: Diagnosis not present

## 2018-05-30 DIAGNOSIS — F17210 Nicotine dependence, cigarettes, uncomplicated: Secondary | ICD-10-CM

## 2018-05-30 MED ORDER — BUPROPION HCL ER (XL) 300 MG PO TB24: 300.0000 mg | ORAL_TABLET | Freq: Every day | ORAL | 3 refills | Status: DC

## 2018-05-30 NOTE — Assessment & Plan Note (Signed)
No improvement with wellbutrin Can't tolerate chantix. He will work on cutting back.

## 2018-05-30 NOTE — Patient Instructions (Signed)
I have sent over a new prescription for wellbutrin, you will take this one only once per day once you are out of your current medication.  I will see you back in about 6-8 weeks.

## 2018-05-30 NOTE — Assessment & Plan Note (Signed)
Lesion consistent with SK.  Becomes irritated from time to time and desired removal. Lesion treated with cryotherapy, see procedure note.

## 2018-05-30 NOTE — Progress Notes (Addendum)
Jeremiah Short - 57 y.o. male MRN [id number]  Date of birth: [birth year]  Subjective Chief Complaint  Patient presents with  . Follow-up    depression/ smoking    HPI Jeremiah Short is a 57 y.o. male with history of CAD, HTN, Nicotine dependence and depression with anxiety here today for f/u of depression and anxiety.  He also would like to have a wart frozen on the chest wall.  Area present for several months, rough in texture and gets caught on clothing making it irritated. Denis bleeding, chages in size or coloration.   -Depression/Anxiety:  Started on wellbutrin about 1 month ago.  Reports some improvement in depressive symptoms but feel that these could be better controlled. Anxiety is about the same.  Denies side effects from bupropion.  He has not been able to cut back on his smoking.   Depression screen [facility] 2/9 05/30/2018 05/02/2018 05/02/2018  Decreased Interest 2 3 2   Down, Depressed, Hopeless 1 3 2   PHQ - 2 Score 3 6 4   Altered sleeping 1 1 -  Tired, decreased energy 2 2 -  Change in appetite 0 2 -  Feeling bad or failure about yourself  1 3 -  Trouble concentrating 1 2 -  Moving slowly or fidgety/restless 1 0 -  Suicidal thoughts 0 0 -  PHQ-9 Score 9 16 -   GAD 7 : Generalized Anxiety Score 05/30/2018 05/02/2018  Nervous, Anxious, on Edge 2 3  Control/stop worrying 2 1  Worry too much - different things 1 1  Trouble relaxing 2 2  Restless 1 1  Easily annoyed or irritable 3 2  Afraid - awful might happen 1 0  Total GAD 7 Score 12 10    ROS:  A comprehensive ROS was completed and negative except as noted per HPI  Allergies  Allergen Reactions  . Morphine And Related     dont remember the symptoms    Past Medical History:  Diagnosis Date  . Anxiety   . Cardiomyopathy in other diseases classified elsewhere   . Coronary artery disease   . Depression   . Diverticulosis   . Esophageal stricture   . GERD (gastroesophageal reflux disease)   . History of ETOH abuse   .  Hypertension   . Myocardial infarction ([place]) 2009  . Pure hypercholesterolemia   . Smoker   . Stented coronary artery   . Tubular adenoma of colon 2012  . Wears glasses     Past Surgical History:  Procedure Laterality Date  . APPENDECTOMY  2011   during colectomy  . CARDIAC CATHETERIZATION  2009   placed 2 stents  . CARDIAC CATHETERIZATION N/A 09/27/2016   Procedure: Left Heart Cath and Coronary Angiography;  Surgeon: [name], MD;  Location: [facility] [facility];  Service: Cardiovascular;  Laterality: N/A;  . CARDIAC CATHETERIZATION N/A 09/27/2016   Procedure: Coronary Stent Intervention;  Surgeon: [name], MD;  Location: [facility];  Service: Cardiovascular;  Laterality: N/A;  . CORONARY STENT PLACEMENT    . HERNIA REPAIR    . INGUINAL HERNIA REPAIR Left 09/16/2013   Procedure: HERNIA REPAIR INGUINAL ADULT;  Surgeon: [name], MD;  Location: [facility];  Service: General;  Laterality: Left;  . MOUTH SURGERY    . PARTIAL COLECTOMY  2011   sigmoid  . TONSILLECTOMY    . WISDOM TOOTH EXTRACTION      Social History   Socioeconomic History  . Marital status: Divorced  Spouse name: Not on file  . Number of children: 2  . Years of education: Not on file  . Highest education level: Not on file  Occupational History  . Occupation: Glass blower/designer  Social Needs  . Financial resource strain: Not on file  . Food insecurity:    Worry: Not on file    Inability: Not on file  . Transportation needs:    Medical: Not on file    Non-medical: Not on file  Tobacco Use  . Smoking status: Current Every Day Smoker    Packs/day: 0.75    Years: 40.00    Pack years: 30.00    Types: E-cigarettes, Cigarettes  . Smokeless tobacco: Never Used  . Tobacco comment: Pt given handout to quit smoking  Substance and Sexual Activity  . Alcohol use: Yes    Alcohol/week: 90.0 standard drinks    Types: 90 Shots of liquor per week    Comment: 1/5 vodka every 3  days (750 ml)  . Drug use: No  . Sexual activity: Not on file  Lifestyle  . Physical activity:    Days per week: Not on file    Minutes per session: Not on file  . Stress: Not on file  Relationships  . Social connections:    Talks on phone: Not on file    Gets together: Not on file    Attends religious service: Not on file    Active member of club or organization: Not on file    Attends meetings of clubs or organizations: Not on file    Relationship status: Not on file  Other Topics Concern  . Not on file  Social History Narrative  . Not on file    Family History  Problem Relation Age of Onset  . Lung cancer Mother   . Heart disease Father        has pacemaker  . Breast cancer Sister   . Colon cancer Neg Hx   . Colon polyps Neg Hx   . Esophageal cancer Neg Hx   . Kidney disease Neg Hx   . Diabetes Neg Hx     Health Maintenance  Topic Date Due  . HIV Screening  01/28/1975  . INFLUENZA VACCINE  05/02/2018  . COLONOSCOPY  09/30/2019  . TETANUS/TDAP  09/06/2024  . Hepatitis C Screening  Completed    ----------------------------------------------------------------------------------------------------------------------------------------------------------------------------------------------------------------- Physical Exam BP (!) 122/92 (BP Location: Right Arm, Patient Position: Sitting, Cuff Size: Normal)   Pulse 92   Wt 160 lb (72.6 kg)   BMI 22.96 kg/m   Physical Exam  Constitutional: He is oriented to person, place, and time. He appears well-nourished. No distress.  HENT:  Head: Normocephalic and atraumatic.  Mouth/Throat: Oropharynx is clear and moist.  Eyes: No scleral icterus.  Cardiovascular: Normal rate, regular rhythm and normal heart sounds.  Pulmonary/Chest: Effort normal and breath sounds normal.  Neurological: He is alert and oriented to person, place, and time.  Skin: Skin is warm and dry. No rash noted.  Slightly raised rough, tan, stuck on  appearing lesion to R upper chest wall.   Psychiatric: He has a normal mood and affect. His behavior is normal.   Procedure note:  Procedure explained to patient and any questions answered.  Verbal consent given.  Lesion on upper chest wall frozen using histofreeze for recommended time.  He tolerated procedure well without any immediate complications.  Post procedure instructions given.   ------------------------------------------------------------------------------------------------------------------------------------------------------------------------------------------------------------------- Assessment and Plan  Depression, recurrent ([place]) Mild improvement  but not well controlled Increase to 300mg  wellbutrin xl daily F/u 6-8 weeks.  If not improving with this can consider adding traditional SSRI to wellbutrin at f/u  Nicotine dependence, cigarettes, uncomplicated No improvement with wellbutrin Can't tolerate chantix. He will work on cutting back.   Seborrheic keratosis Lesion consistent with SK.  Becomes irritated from time to time and desired removal. Lesion treated with cryotherapy, see procedure note.

## 2018-05-30 NOTE — Assessment & Plan Note (Signed)
Mild improvement but not well controlled Increase to 300mg  wellbutrin xl daily F/u 6-8 weeks.  If not improving with this can consider adding traditional SSRI to wellbutrin at f/u

## 2018-06-17 ENCOUNTER — Encounter: Payer: Self-pay | Admitting: Family Medicine

## 2018-06-17 ENCOUNTER — Ambulatory Visit (INDEPENDENT_AMBULATORY_CARE_PROVIDER_SITE_OTHER): Payer: 59 | Admitting: Family Medicine

## 2018-06-17 ENCOUNTER — Ambulatory Visit (INDEPENDENT_AMBULATORY_CARE_PROVIDER_SITE_OTHER): Payer: 59

## 2018-06-17 VITALS — BP 110/70 | HR 105 | Temp 97.5°F | Ht 70.0 in | Wt 155.8 lb

## 2018-06-17 DIAGNOSIS — Z23 Encounter for immunization: Secondary | ICD-10-CM | POA: Diagnosis not present

## 2018-06-17 DIAGNOSIS — G8929 Other chronic pain: Secondary | ICD-10-CM | POA: Diagnosis not present

## 2018-06-17 DIAGNOSIS — M545 Low back pain: Secondary | ICD-10-CM

## 2018-06-17 MED ORDER — METHYLPREDNISOLONE 4 MG PO TBPK: ORAL_TABLET | ORAL | 0 refills | Status: DC

## 2018-06-17 MED ORDER — TRAMADOL HCL 50 MG PO TABS: 50.0000 mg | ORAL_TABLET | Freq: Three times a day (TID) | ORAL | 0 refills | Status: DC | PRN

## 2018-06-17 NOTE — Patient Instructions (Signed)

## 2018-06-17 NOTE — Assessment & Plan Note (Signed)
-  Acute on chronic exacerbation of back pain -Does not recall having imaging in the past, ordered today -Rx for medrol dosepak and short term tramadol.  -Discussed chiropractor vs PT, he will consider this.  -OTC TENS unite may be helpful, provided information.

## 2018-06-17 NOTE — Progress Notes (Signed)
Jeremiah Short - 57 y.o. male MRN [id number]  Date of birth: May 29, 1961  Subjective Chief Complaint  Patient presents with  . Back Pain    HPI Jeremiah Short is a 57 y.o. male with history of CAD here today with complaint of low back pain.  Has had issues off and on with his back for several years, typically flaring up 2-3x/year.  Has responded well to steroids in the past.  This episode started about 3 weeks ago.  He denies radiation of pain, numbness, tingling or weakness.  Pain is worse after sitting or laying and first getting up.  He doesn't really have pain with standing or movement.  TENS unit at chiropractor has been helpful in the past as well.   ROS:  A comprehensive ROS was completed and negative except as noted per HPI  Allergies  Allergen Reactions  . Morphine And Related     dont remember the symptoms    Past Medical History:  Diagnosis Date  . Anxiety   . Cardiomyopathy in other diseases classified elsewhere   . Coronary artery disease   . Depression   . Diverticulosis   . Esophageal stricture   . GERD (gastroesophageal reflux disease)   . History of ETOH abuse   . Hypertension   . Myocardial infarction ([place]) 2009  . Pure hypercholesterolemia   . Smoker   . Stented coronary artery   . Tubular adenoma of colon 2012  . Wears glasses     Past Surgical History:  Procedure Laterality Date  . APPENDECTOMY  2011   during colectomy  . CARDIAC CATHETERIZATION  2009   placed 2 stents  . CARDIAC CATHETERIZATION N/A 09/27/2016   Procedure: Left Heart Cath and Coronary Angiography;  Surgeon: [name], MD;  Location: [facility];  Service: Cardiovascular;  Laterality: N/A;  . CARDIAC CATHETERIZATION N/A 09/27/2016   Procedure: Coronary Stent Intervention;  Surgeon: [name], MD;  Location: [facility];  Service: Cardiovascular;  Laterality: N/A;  . CORONARY STENT PLACEMENT    . HERNIA REPAIR    . INGUINAL HERNIA REPAIR Left 09/16/2013   Procedure:  HERNIA REPAIR INGUINAL ADULT;  Surgeon: [name], MD;  Location: [place];  Service: General;  Laterality: Left;  . MOUTH SURGERY    . PARTIAL COLECTOMY  2011   sigmoid  . TONSILLECTOMY    . WISDOM TOOTH EXTRACTION      Social History   Socioeconomic History  . Marital status: Divorced    Spouse name: Not on file  . Number of children: 2  . Years of education: Not on file  . Highest education level: Not on file  Occupational History  . Occupation: Glass blower/designer  Social Needs  . Financial resource strain: Not on file  . Food insecurity:    Worry: Not on file    Inability: Not on file  . Transportation needs:    Medical: Not on file    Non-medical: Not on file  Tobacco Use  . Smoking status: Current Every Day Smoker    Packs/day: 0.75    Years: 40.00    Pack years: 30.00    Types: E-cigarettes, Cigarettes  . Smokeless tobacco: Never Used  . Tobacco comment: Pt given handout to quit smoking  Substance and Sexual Activity  . Alcohol use: Yes    Alcohol/week: 90.0 standard drinks    Types: 90 Shots of liquor per week    Comment: 1/5 vodka every 3  days (750 ml)  . Drug use: No  . Sexual activity: Not on file  Lifestyle  . Physical activity:    Days per week: Not on file    Minutes per session: Not on file  . Stress: Not on file  Relationships  . Social connections:    Talks on phone: Not on file    Gets together: Not on file    Attends religious service: Not on file    Active member of club or organization: Not on file    Attends meetings of clubs or organizations: Not on file    Relationship status: Not on file  Other Topics Concern  . Not on file  Social History Narrative  . Not on file    Family History  Problem Relation Age of Onset  . Lung cancer Mother   . Heart disease Father        has pacemaker  . Breast cancer Sister   . Colon cancer Neg Hx   . Colon polyps Neg Hx   . Esophageal cancer Neg Hx   . Kidney disease Neg  Hx   . Diabetes Neg Hx     Health Maintenance  Topic Date Due  . HIV Screening  01/28/1975  . INFLUENZA VACCINE  05/02/2018  . COLONOSCOPY  09/30/2019  . TETANUS/TDAP  09/06/2024  . Hepatitis C Screening  Completed    ----------------------------------------------------------------------------------------------------------------------------------------------------------------------------------------------------------------- Physical Exam BP 110/70   Pulse (!) 105   Temp (!) 97.5 F (36.4 C)   Ht 5\' 10"  (1.778 m)   Wt 155 lb 12.8 oz (70.7 kg)   SpO2 95%   BMI 22.35 kg/m   Physical Exam  Constitutional: He is oriented to person, place, and time. He appears well-nourished. No distress.  HENT:  Head: Normocephalic and atraumatic.  Mouth/Throat: Oropharynx is clear and moist.  Cardiovascular: Normal rate, regular rhythm and normal heart sounds.  Musculoskeletal:  L spine is normal to palpation without step-off or spasm noted.  ROM of lumbar spine is normal with increased pain on flexion.    Neurological: He is alert and oriented to person, place, and time. He exhibits normal muscle tone. Coordination normal.  Psychiatric: He has a normal mood and affect. His behavior is normal.    ------------------------------------------------------------------------------------------------------------------------------------------------------------------------------------------------------------------- Assessment and Plan  Chronic bilateral low back pain without sciatica -Acute on chronic exacerbation of back pain -Does not recall having imaging in the past, ordered today -Rx for medrol dosepak and short term tramadol.  -Discussed chiropractor vs PT, he will consider this.  -OTC TENS unite may be helpful, provided information.

## 2018-06-18 NOTE — Progress Notes (Signed)
Xray shows arthritis and disc degeneration of lower back.  Continue current treatment plan.

## 2018-07-08 ENCOUNTER — Other Ambulatory Visit: Payer: Self-pay | Admitting: Nurse Practitioner

## 2018-07-10 ENCOUNTER — Ambulatory Visit (INDEPENDENT_AMBULATORY_CARE_PROVIDER_SITE_OTHER): Payer: 59 | Admitting: Family Medicine

## 2018-07-10 ENCOUNTER — Encounter: Payer: Self-pay | Admitting: Family Medicine

## 2018-07-10 DIAGNOSIS — F339 Major depressive disorder, recurrent, unspecified: Secondary | ICD-10-CM | POA: Diagnosis not present

## 2018-07-10 MED ORDER — ESCITALOPRAM OXALATE 10 MG PO TABS: 10.0000 mg | ORAL_TABLET | Freq: Every day | ORAL | 1 refills | Status: DC

## 2018-07-10 MED ORDER — BUPROPION HCL ER (XL) 150 MG PO TB24: 150.0000 mg | ORAL_TABLET | Freq: Every day | ORAL | 1 refills | Status: DC

## 2018-07-10 NOTE — Patient Instructions (Signed)
-  Reduce wellbutrin back to 150mg , start lexapro in addition to wellbutrin -See me again in 2 months.

## 2018-07-10 NOTE — Assessment & Plan Note (Signed)
-  Has not had further improvement with wellbutrin with increased dose.  -Will have him reduce to 150mg  and try adding lexapro for augmentation. -Return in 8 weeks.

## 2018-07-10 NOTE — Progress Notes (Signed)
Jeremiah Short - 57 y.o. male MRN [id number]  Date of birth: 1961-08-11  Subjective Chief Complaint  Patient presents with  . Depression    HPI Jeremiah Short is a 57 y.o. male here today for follow up of depression and anxiety.  He is currently treated with bupropion which was increased previously.  He continues to express feelings of diminished motivation and anhedonia.  He tells me that he just feels like sitting around and not doing anything but watching TV at the end of the day.  His anxiety has improved.  Depression screen [facility] 2/9 07/10/2018 05/30/2018 05/02/2018  Decreased Interest 3 2 3   Down, Depressed, Hopeless 1 1 3   PHQ - 2 Score 4 3 6   Altered sleeping 1 1 1   Tired, decreased energy 1 2 2   Change in appetite - 0 2  Feeling bad or failure about yourself  2 1 3   Trouble concentrating 1 1 2   Moving slowly or fidgety/restless 0 1 0  Suicidal thoughts 0 0 0  PHQ-9 Score 9 9 16    GAD 7 : Generalized Anxiety Score 07/10/2018 05/30/2018 05/02/2018  Nervous, Anxious, on Edge 1 2 3   Control/stop worrying 0 2 1  Worry too much - different things 0 1 1  Trouble relaxing 1 2 2   Restless 0 1 1  Easily annoyed or irritable 2 3 2   Afraid - awful might happen 0 1 0  Total GAD 7 Score 4 12 10     ROS:  A comprehensive ROS was completed and negative except as noted per HPI  Allergies  Allergen Reactions  . Morphine And Related     dont remember the symptoms    Past Medical History:  Diagnosis Date  . Anxiety   . Cardiomyopathy in other diseases classified elsewhere   . Coronary artery disease   . Depression   . Diverticulosis   . Esophageal stricture   . GERD (gastroesophageal reflux disease)   . History of ETOH abuse   . Hypertension   . Myocardial infarction ([place]) 2009  . Pure hypercholesterolemia   . Smoker   . Stented coronary artery   . Tubular adenoma of colon 2012  . Wears glasses     Past Surgical History:  Procedure Laterality Date  . APPENDECTOMY  2011   during colectomy  . CARDIAC CATHETERIZATION  2009   placed 2 stents  . CARDIAC CATHETERIZATION N/A 09/27/2016   Procedure: Left Heart Cath and Coronary Angiography;  Surgeon: [name], MD;  Location: [facility];  Service: Cardiovascular;  Laterality: N/A;  . CARDIAC CATHETERIZATION N/A 09/27/2016   Procedure: Coronary Stent Intervention;  Surgeon: [name], MD;  Location: [facility];  Service: Cardiovascular;  Laterality: N/A;  . CORONARY STENT PLACEMENT    . HERNIA REPAIR    . INGUINAL HERNIA REPAIR Left 09/16/2013   Procedure: HERNIA REPAIR INGUINAL ADULT;  Surgeon: [name], MD;  Location: [facility];  Service: General;  Laterality: Left;  . MOUTH SURGERY    . PARTIAL COLECTOMY  2011   sigmoid  . TONSILLECTOMY    . WISDOM TOOTH EXTRACTION      Social History   Socioeconomic History  . Marital status: Divorced    Spouse name: Not on file  . Number of children: 2  . Years of education: Not on file  . Highest education level: Not on file  Occupational History  . Occupation: Glass blower/designer  Social Needs  . Financial  resource strain: Not on file  . Food insecurity:    Worry: Not on file    Inability: Not on file  . Transportation needs:    Medical: Not on file    Non-medical: Not on file  Tobacco Use  . Smoking status: Current Every Day Smoker    Packs/day: 0.75    Years: 40.00    Pack years: 30.00    Types: E-cigarettes, Cigarettes  . Smokeless tobacco: Never Used  . Tobacco comment: Pt given handout to quit smoking  Substance and Sexual Activity  . Alcohol use: Yes    Alcohol/week: 90.0 standard drinks    Types: 90 Shots of liquor per week    Comment: 1/5 vodka every 3 days (750 ml)  . Drug use: No  . Sexual activity: Not on file  Lifestyle  . Physical activity:    Days per week: Not on file    Minutes per session: Not on file  . Stress: Not on file  Relationships  . Social connections:    Talks on phone: Not on  file    Gets together: Not on file    Attends religious service: Not on file    Active member of club or organization: Not on file    Attends meetings of clubs or organizations: Not on file    Relationship status: Not on file  Other Topics Concern  . Not on file  Social History Narrative  . Not on file    Family History  Problem Relation Age of Onset  . Lung cancer Mother   . Heart disease Father        has pacemaker  . Breast cancer Sister   . Colon cancer Neg Hx   . Colon polyps Neg Hx   . Esophageal cancer Neg Hx   . Kidney disease Neg Hx   . Diabetes Neg Hx     Health Maintenance  Topic Date Due  . HIV Screening  01/28/1975  . COLONOSCOPY  09/30/2019  . TETANUS/TDAP  09/06/2024  . INFLUENZA VACCINE  Completed  . Hepatitis C Screening  Completed    ----------------------------------------------------------------------------------------------------------------------------------------------------------------------------------------------------------------- Physical Exam BP (!) 120/92   Pulse 80   Temp 97.9 F (36.6 C)   Ht 5\' 10"  (1.778 m)   Wt 156 lb 6.4 oz (70.9 kg)   SpO2 98%   BMI 22.44 kg/m   Physical Exam  Constitutional: He is oriented to person, place, and time. He appears well-nourished. No distress.  HENT:  Head: Normocephalic and atraumatic.  Mouth/Throat: Oropharynx is clear and moist.  Neurological: He is alert and oriented to person, place, and time.  Skin: Skin is warm and dry. No rash noted.  Psychiatric: He has a normal mood and affect. His behavior is normal.    ------------------------------------------------------------------------------------------------------------------------------------------------------------------------------------------------------------------- Assessment and Plan  Depression, recurrent ([place]) -Has not had further improvement with wellbutrin with increased dose.  -Will have him reduce to 150mg  and try adding  lexapro for augmentation. -Return in 8 weeks.

## 2018-07-11 ENCOUNTER — Encounter: Payer: Self-pay | Admitting: Cardiology

## 2018-07-11 ENCOUNTER — Ambulatory Visit (INDEPENDENT_AMBULATORY_CARE_PROVIDER_SITE_OTHER): Payer: 59 | Admitting: Cardiology

## 2018-07-11 VITALS — BP 114/90 | HR 93 | Ht 70.0 in | Wt 159.2 lb

## 2018-07-11 DIAGNOSIS — I10 Essential (primary) hypertension: Secondary | ICD-10-CM

## 2018-07-11 DIAGNOSIS — E7800 Pure hypercholesterolemia, unspecified: Secondary | ICD-10-CM

## 2018-07-11 DIAGNOSIS — I209 Angina pectoris, unspecified: Secondary | ICD-10-CM

## 2018-07-11 DIAGNOSIS — Z720 Tobacco use: Secondary | ICD-10-CM

## 2018-07-11 DIAGNOSIS — I2510 Atherosclerotic heart disease of native coronary artery without angina pectoris: Secondary | ICD-10-CM

## 2018-07-11 NOTE — Progress Notes (Signed)
Cardiology Office Note:    Date:  07/11/2018   ID:  Jeremiah Short, DOB 1961/08/08, MRN [id number]  PCP:  Luetta Nutting, DO  Cardiologist:  No primary care provider on file.  Electrophysiologist:  None   Referring MD: Luetta Nutting, DO     History of Present Illness:    Jeremiah Short is a 57 y.o. male with coronary artery disease status post PCI to the LAD in the setting of ST elevation myocardial infarction in 2009 and again in December 2017 with diagonal 2 jailed treated with angioplasty here for follow-up.  Plan was to continue with indefinite DAPT at the time.  Plavix and aspirin.  Occasionally may have some periodic chest discomfort.  Possibly GI.  Isosorbide was increased to 60 at last visit with Truitt Merle, NP 05/13/2018.  EF is also mildly reduced/moderately reduced 40 to 45%.  Still try to work on smoking.  He is contemplated this.  He knows that it is bad for him for instance but he is still smoking.  We discussed this at length today.  Denies any significant bleeding, he does have some easy bruising, external hemorrhoid with occasional spotting.  No fevers chills nausea vomiting syncope.  Chest pain is improved on increased Imdur as well as GERD medication.  Past Medical History:  Diagnosis Date  . Anxiety   . Cardiomyopathy in other diseases classified elsewhere   . Coronary artery disease   . Depression   . Diverticulosis   . Esophageal stricture   . GERD (gastroesophageal reflux disease)   . History of ETOH abuse   . Hypertension   . Myocardial infarction ([place]) 2009  . Pure hypercholesterolemia   . Smoker   . Stented coronary artery   . Tubular adenoma of colon 2012  . Wears glasses     Past Surgical History:  Procedure Laterality Date  . APPENDECTOMY  2011   during colectomy  . CARDIAC CATHETERIZATION  2009   placed 2 stents  . CARDIAC CATHETERIZATION N/A 09/27/2016   Procedure: Left Heart Cath and Coronary Angiography;  Surgeon: [name], MD;   Location: [facility];  Service: Cardiovascular;  Laterality: N/A;  . CARDIAC CATHETERIZATION N/A 09/27/2016   Procedure: Coronary Stent Intervention;  Surgeon: [name], MD;  Location: [facility];  Service: Cardiovascular;  Laterality: N/A;  . CORONARY STENT PLACEMENT    . HERNIA REPAIR    . INGUINAL HERNIA REPAIR Left 09/16/2013   Procedure: HERNIA REPAIR INGUINAL ADULT;  Surgeon: [name], MD;  Location: [facility];  Service: General;  Laterality: Left;  . MOUTH SURGERY    . PARTIAL COLECTOMY  2011   sigmoid  . TONSILLECTOMY    . WISDOM TOOTH EXTRACTION      Current Medications: Current Meds  Medication Sig  . aspirin EC 81 MG EC tablet Take 1 tablet (81 mg total) by mouth daily.  [name] atorvastatin (LIPITOR) 80 MG tablet TAKE 1 TABLET (80 MG TOTAL) BY MOUTH DAILY AT 6 PM.  . buPROPion (WELLBUTRIN XL) 150 MG 24 hr tablet Take 75 mg by mouth daily. Take half tablet by mouth daily.  . carvedilol (COREG) 12.5 MG tablet TAKE 1 TABLET BY MOUTH TWICE A DAY  . clopidogrel (PLAVIX) 75 MG tablet Take 1 tablet (75 mg total) by mouth daily.  . cyclobenzaprine (FLEXERIL) 10 MG tablet Take 1 tablet (10 mg total) by mouth 3 (three) times daily as needed for muscle spasms.  [name] escitalopram (LEXAPRO)  10 MG tablet Take 1 tablet (10 mg total) by mouth daily.  . isosorbide mononitrate (IMDUR) 60 MG 24 hr tablet Take 1 tablet (60 mg total) by mouth daily.  [name] losartan (COZAAR) 50 MG tablet Take 1 tablet (50 mg total) by mouth daily.  . methylPREDNISolone (MEDROL) 4 MG TBPK tablet Take as directed on packaging  . pantoprazole (PROTONIX) 40 MG tablet Take 1 tablet (40 mg total) by mouth daily.     Allergies:   Morphine and related   Social History   Socioeconomic History  . Marital status: Divorced    Spouse name: Not on file  . Number of children: 2  . Years of education: Not on file  . Highest education level: Not on file  Occupational History  . Occupation:  Glass blower/designer  Social Needs  . Financial resource strain: Not on file  . Food insecurity:    Worry: Not on file    Inability: Not on file  . Transportation needs:    Medical: Not on file    Non-medical: Not on file  Tobacco Use  . Smoking status: Current Every Day Smoker    Packs/day: 0.75    Years: 40.00    Pack years: 30.00    Types: E-cigarettes, Cigarettes  . Smokeless tobacco: Never Used  . Tobacco comment: Pt given handout to quit smoking  Substance and Sexual Activity  . Alcohol use: Yes    Alcohol/week: 90.0 standard drinks    Types: 90 Shots of liquor per week    Comment: 1/5 vodka every 3 days (750 ml)  . Drug use: No  . Sexual activity: Not on file  Lifestyle  . Physical activity:    Days per week: Not on file    Minutes per session: Not on file  . Stress: Not on file  Relationships  . Social connections:    Talks on phone: Not on file    Gets together: Not on file    Attends religious service: Not on file    Active member of club or organization: Not on file    Attends meetings of clubs or organizations: Not on file    Relationship status: Not on file  Other Topics Concern  . Not on file  Social History Narrative  . Not on file     Family History: The patient's family history includes Breast cancer in his sister; Heart disease in his father; Lung cancer in his mother. There is no history of Colon cancer, Colon polyps, Esophageal cancer, Kidney disease, or Diabetes.  ROS:   Please see the history of present illness.     All other systems reviewed and are negative.  EKGs/Labs/Other Studies Reviewed:    The following studies were reviewed today: Labs prior office note, EKG  EKG:  EKG is not ordered today.  The ekg ordered today demonstrates from 05/13/2018-sinus rhythm poor R wave progression, old anterior infarct pattern.  Personally reviewed  Recent Labs: 05/06/2018: ALT 19; Hemoglobin 14.2; Platelets 138.0; TSH 1.40 05/13/2018: BUN 22; Creatinine,  Ser 0.97; Potassium 4.5; Sodium 140  Recent Lipid Panel    Component Value Date/Time   CHOL 98 05/06/2018 0914   CHOL 111 07/17/2017 1449   TRIG 287.0 (H) 05/06/2018 0914   HDL 51.80 05/06/2018 0914   HDL 63 07/17/2017 1449   CHOLHDL 2 05/06/2018 0914   VLDL 57.4 (H) 05/06/2018 0914   LDLCALC 29 07/17/2017 1449   LDLDIRECT 15.0 05/06/2018 0914    Physical Exam:  VS:  BP 114/90   Pulse 93   Ht 5\' 10"  (1.778 m)   Wt 159 lb 3.2 oz (72.2 kg)   SpO2 99%   BMI 22.84 kg/m     Wt Readings from Last 3 Encounters:  07/11/18 159 lb 3.2 oz (72.2 kg)  07/10/18 156 lb 6.4 oz (70.9 kg)  06/17/18 155 lb 12.8 oz (70.7 kg)     GEN:  Well nourished, well developed in no acute distress HEENT: Normal NECK: No JVD; No carotid bruits LYMPHATICS: No lymphadenopathy CARDIAC: RRR, no murmurs, rubs, gallops RESPIRATORY:  Clear to auscultation without rales, wheezing or rhonchi  ABDOMEN: Soft, non-tender, non-distended MUSCULOSKELETAL:  No edema; No deformity  SKIN: Warm and dry NEUROLOGIC:  Alert and oriented x 3 PSYCHIATRIC:  Normal affect   ASSESSMENT:    1. Coronary artery disease involving native coronary artery of native heart, angina presence unspecified   2. Angina pectoris ([place])   3. Tobacco abuse   4. Essential hypertension   5. Pure hypercholesterolemia    PLAN:    In order of problems listed above:  Coronary artery disease status post anterior MI both in 2009 and in 2017 - Plan was to continue with dual antiplatelet therapy for life.  Watch for any signs of major bleeding.  Doing well.  Last hemoglobin 14.2 -Aggressive secondary risk factor prevention. -EF previously moderately reduced 45%.  Continue with carvedilol, Cozaar.  NYHA class I.  Angina - Improved with isosorbide 60.  Also improved with concomitant GERD medication.  Hyperlipidemia -Excellent LDL 15.  Continue with high intensity statin therapy.  Atorvastatin 80.  No side effects.  ALT 19.  Tobacco use -  Encouraged tobacco cessation at length today.  We spent over 5 minutes discussing strategies, tactics including visual cues to help with tobacco cessation.  He is in the borderline contemplative stage.  Currently taking Wellbutrin.  GERD -Improved after taking Protonix.   Medication Adjustments/Labs and Tests Ordered: Current medicines are reviewed at length with the patient today.  Concerns regarding medicines are outlined above.  No orders of the defined types were placed in this encounter.  No orders of the defined types were placed in this encounter.   Patient Instructions  Medication Instructions:   Your physician recommends that you continue on your current medications as directed. Please refer to the Current Medication list given to you today.  If you need a refill on your cardiac medications before your next appointment, please call your pharmacy.     Follow-Up: At [facility], you and your health needs are our priority.  As part of our continuing mission to provide you with exceptional heart care, we have created designated Provider Care Teams.  These Care Teams include your primary Cardiologist (physician) and Advanced Practice Providers (APPs -  Physician Assistants and Nurse Practitioners) who all work together to provide you with the care you need, when you need it. You will need a follow up appointment in 1 years with Dr. [name].  Please call our office 2 months in advance to schedule this appointment.  You may see No primary care provider on file. or one of the following Advanced Practice Providers on your designated Care Team:   Truitt Merle, NP--you will see Cecille Rubin in 6 months        Signed, Candee Furbish, MD  07/11/2018 9:55 AM    [place]

## 2018-07-11 NOTE — Patient Instructions (Signed)
Medication Instructions:   Your physician recommends that you continue on your current medications as directed. Please refer to the Current Medication list given to you today.  If you need a refill on your cardiac medications before your next appointment, please call your pharmacy.     Follow-Up: At [facility], you and your health needs are our priority.  As part of our continuing mission to provide you with exceptional heart care, we have created designated Provider Care Teams.  These Care Teams include your primary Cardiologist (physician) and Advanced Practice Providers (APPs -  Physician Assistants and Nurse Practitioners) who all work together to provide you with the care you need, when you need it. You will need a follow up appointment in 1 years with Dr. [name].  Please call our office 2 months in advance to schedule this appointment.  You may see No primary care provider on file. or one of the following Advanced Practice Providers on your designated Care Team:   Truitt Merle, NP--you will see Cecille Rubin in 6 months

## 2018-08-09 ENCOUNTER — Other Ambulatory Visit: Payer: Self-pay | Admitting: Nurse Practitioner

## 2018-08-09 DIAGNOSIS — R0789 Other chest pain: Secondary | ICD-10-CM

## 2018-08-09 DIAGNOSIS — I2510 Atherosclerotic heart disease of native coronary artery without angina pectoris: Secondary | ICD-10-CM

## 2018-08-13 ENCOUNTER — Encounter: Payer: Self-pay | Admitting: Gastroenterology

## 2018-08-13 ENCOUNTER — Telehealth: Payer: Self-pay

## 2018-08-13 ENCOUNTER — Ambulatory Visit (INDEPENDENT_AMBULATORY_CARE_PROVIDER_SITE_OTHER): Payer: 59 | Admitting: Gastroenterology

## 2018-08-13 VITALS — BP 102/72 | HR 97 | Ht 70.0 in | Wt 161.0 lb

## 2018-08-13 DIAGNOSIS — K921 Melena: Secondary | ICD-10-CM | POA: Diagnosis not present

## 2018-08-13 DIAGNOSIS — Z86010 Personal history of colonic polyps: Secondary | ICD-10-CM | POA: Diagnosis not present

## 2018-08-13 DIAGNOSIS — Z7902 Long term (current) use of antithrombotics/antiplatelets: Secondary | ICD-10-CM | POA: Diagnosis not present

## 2018-08-13 DIAGNOSIS — K642 Third degree hemorrhoids: Secondary | ICD-10-CM | POA: Diagnosis not present

## 2018-08-13 MED ORDER — NA SULFATE-K SULFATE-MG SULF 17.5-3.13-1.6 GM/177ML PO SOLN: 1.0000 | Freq: Once | ORAL | 0 refills | Status: AC

## 2018-08-13 NOTE — Telephone Encounter (Signed)
Patient can hold Plavix and aspirin for colonoscopy for 5 days prior to procedure.  Please resume post colonoscopy when felt to be safe from a bleeding perspective by gastroenterologist. Candee Furbish, MD

## 2018-08-13 NOTE — Patient Instructions (Signed)
Start over the counter preperation H suppositories insert into your rectum daily at bedtime.   You have been scheduled for a colonoscopy. Please follow written instructions given to you at your visit today.  Please pick up your prep supplies at the pharmacy within the next 1-3 days. If you use inhalers (even only as needed), please bring them with you on the day of your procedure. Your physician has requested that you go to www.startemmi.com and enter the access code given to you at your visit today. This web site gives a general overview about your procedure. However, you should still follow specific instructions given to you by our office regarding your preparation for the procedure.  Normal BMI (Body Mass Index- based on height and weight) is between 19 and 25. Your BMI today is Body mass index is 23.1 kg/m. [name] Please consider follow up  regarding your BMI with your Primary Care Provider.  Thank you for choosing me and [facility].  Pricilla Riffle. Dagoberto Ligas., MD., Marval Regal

## 2018-08-13 NOTE — Telephone Encounter (Signed)
[facility] Pre-operative Risk Assessment     Request for surgical clearance:     Endoscopy Procedure  What type of surgery is being performed?     Colonoscopy  When is this surgery scheduled?     09/24/18  What type of clearance is required ?   Pharmacy  Are there any medications that need to be held prior to surgery and how long? Plavix x 5 days  Practice name and name of physician performing surgery?      [facility]  What is your office phone and fax number?      Phone- [phone]  [phone]  Anesthesia type (None, local, MAC, general) ?       MAC

## 2018-08-13 NOTE — Progress Notes (Addendum)
History of Present Illness: This is a 57 year old male self referred for the evaluation of rectal bleeding and prolapsing hemorrhoids.  He relates difficulties for the past year with bright red blood per rectum with bowel movements almost on a daily basis.  He relates frequent prolapse of tissue that he presumes to be hemorrhoids.  They reduce spontaneously intermittently and he reduces them manually intermittently.  He denies constipation and straining.  He is overdue for surveillance colonoscopy.  Reflux symptoms are well controlled. Denies weight loss, abdominal pain, constipation, diarrhea, change in stool caliber, melena, nausea, vomiting, dysphagia, reflux symptoms, chest pain.  Colonoscopy 09/2014: 1. Sessile polyp at the appendiceal orifice; polypectomy performed with a cold snare 2. Two semi-pedunculated polyps in the descending colon; polypectomies performed with a cold snare 3. Moderate diverticulosis throughout the entire examined colon 4. Prior colo-colonic surgical anastomosis in the left colon 5. The examination was otherwise normal   Allergies  Allergen Reactions  . Morphine And Related     dont remember the symptoms   Outpatient Medications Prior to Visit  Medication Sig Dispense Refill  . aspirin EC 81 MG EC tablet Take 1 tablet (81 mg total) by mouth daily.    [name] atorvastatin (LIPITOR) 80 MG tablet TAKE 1 TABLET (80 MG TOTAL) BY MOUTH DAILY AT 6 PM. 90 tablet 3  . buPROPion (WELLBUTRIN XL) 150 MG 24 hr tablet Take 75 mg by mouth daily. Take half tablet by mouth daily.    . carvedilol (COREG) 12.5 MG tablet TAKE 1 TABLET BY MOUTH TWICE A DAY 180 tablet 3  . clopidogrel (PLAVIX) 75 MG tablet Take 1 tablet (75 mg total) by mouth daily. 90 tablet 3  . escitalopram (LEXAPRO) 10 MG tablet Take 1 tablet (10 mg total) by mouth daily. 90 tablet 1  . isosorbide mononitrate (IMDUR) 60 MG 24 hr tablet Take 1 tablet (60 mg total) by mouth daily. 30 tablet 3  . losartan (COZAAR) 50  MG tablet Take 1 tablet (50 mg total) by mouth daily. 90 tablet 1  . pantoprazole (PROTONIX) 40 MG tablet TAKE 1 TABLET BY MOUTH EVERY DAY 90 tablet 3  . cyclobenzaprine (FLEXERIL) 10 MG tablet Take 1 tablet (10 mg total) by mouth 3 (three) times daily as needed for muscle spasms. 30 tablet 0  . methylPREDNISolone (MEDROL) 4 MG TBPK tablet Take as directed on packaging 21 tablet 0   No facility-administered medications prior to visit.    Past Medical History:  Diagnosis Date  . Anxiety   . Cardiomyopathy in other diseases classified elsewhere   . Coronary artery disease   . Depression   . Diverticulosis   . Esophageal stricture   . GERD (gastroesophageal reflux disease)   . History of ETOH abuse   . Hypertension   . Myocardial infarction ([place]) 2009  . Pure hypercholesterolemia   . Smoker   . Stented coronary artery   . Tubular adenoma of colon 2012  . Wears glasses    Past Surgical History:  Procedure Laterality Date  . APPENDECTOMY  2011   during colectomy  . CARDIAC CATHETERIZATION  2009   placed 2 stents  . CARDIAC CATHETERIZATION N/A 09/27/2016   Procedure: Left Heart Cath and Coronary Angiography;  Surgeon: [name], MD;  Location: [facility];  Service: Cardiovascular;  Laterality: N/A;  . CARDIAC CATHETERIZATION N/A 09/27/2016   Procedure: Coronary Stent Intervention;  Surgeon: [name], MD;  Location: [place] [facility];  Service: Cardiovascular;  Laterality: N/A;  . CORONARY STENT PLACEMENT    . HERNIA REPAIR    . INGUINAL HERNIA REPAIR Left 09/16/2013   Procedure: HERNIA REPAIR INGUINAL ADULT;  Surgeon: [name], MD;  Location: [place];  Service: General;  Laterality: Left;  . MOUTH SURGERY    . PARTIAL COLECTOMY  2011   sigmoid  . TONSILLECTOMY    . WISDOM TOOTH EXTRACTION     Social History   Socioeconomic History  . Marital status: Divorced    Spouse name: Not on file  . Number of children: 2  . Years of education:  Not on file  . Highest education level: Not on file  Occupational History  . Occupation: Glass blower/designer  Social Needs  . Financial resource strain: Not on file  . Food insecurity:    Worry: Not on file    Inability: Not on file  . Transportation needs:    Medical: Not on file    Non-medical: Not on file  Tobacco Use  . Smoking status: Current Every Day Smoker    Packs/day: 0.75    Years: 40.00    Pack years: 30.00    Types: E-cigarettes, Cigarettes  . Smokeless tobacco: Never Used  . Tobacco comment: Pt given handout to quit smoking  Substance and Sexual Activity  . Alcohol use: Yes    Alcohol/week: 90.0 standard drinks    Types: 90 Shots of liquor per week    Comment: 1/5 vodka every 3 days (750 ml)  . Drug use: No  . Sexual activity: Not on file  Lifestyle  . Physical activity:    Days per week: Not on file    Minutes per session: Not on file  . Stress: Not on file  Relationships  . Social connections:    Talks on phone: Not on file    Gets together: Not on file    Attends religious service: Not on file    Active member of club or organization: Not on file    Attends meetings of clubs or organizations: Not on file    Relationship status: Not on file  Other Topics Concern  . Not on file  Social History Narrative  . Not on file   Family History  Problem Relation Age of Onset  . Lung cancer Mother   . Heart disease Father        has pacemaker  . Breast cancer Sister   . Colon cancer Neg Hx   . Colon polyps Neg Hx   . Esophageal cancer Neg Hx   . Kidney disease Neg Hx   . Diabetes Neg Hx   . Stomach cancer Neg Hx        Review of Systems: Pertinent positive and negative review of systems were noted in the above HPI section. All other review of systems were otherwise negative.    Physical Exam: General: Well developed, well nourished, no acute distress Head: Normocephalic and atraumatic Eyes:  sclerae anicteric, EOMI Ears: Normal auditory  acuity Mouth: No deformity or lesions Neck: Supple, no masses or thyromegaly Lungs: Clear throughout to auscultation Heart: Regular rate and rhythm; no murmurs, rubs or bruits Abdomen: Soft, non tender and non distended. No masses, hepatosplenomegaly or hernias noted. Normal Bowel sounds Rectal: Deferred to colonoscopy Musculoskeletal: Symmetrical with no gross deformities  Skin: No lesions on visible extremities Pulses:  Normal pulses noted Extremities: No clubbing, cyanosis, edema or deformities noted Neurological: Alert oriented x 4, grossly  nonfocal Cervical Nodes:  No significant cervical adenopathy Inguinal Nodes: No significant inguinal adenopathy Psychological:  Alert and cooperative. Normal mood and affect   Assessment and Recommendations:  1. Hematochezia and prolapse.  Grade III internal hemorrhoids suspected.  Rule out colorectal neoplasms and other disorders.  Schedule colonoscopy. The risks (including bleeding, perforation, infection, missed lesions, medication reactions and possible hospitalization or surgery if complications occur), benefits, and alternatives to colonoscopy with possible biopsy and possible polypectomy were discussed with the patient and they consent to proceed.  Increase daily fiber intake and at least 8 glasses of water daily.  Add Benefiber daily if stools are hard or any straining is noted.  We discussed treatment with Preparation H coated with 1% hydrocortisone cream intermittently, hemorrhoidal banding and surgical referral for management options of hemorrhoids.  He prefers to proceed with hemorrhoidal banding after colonoscopy.  2. Personal history of adenomatous colon polyps. TVA with HGD in 2012.  He is overdue for a 3-year surveillance colonoscopy.  Schedule colonoscopy as above.  3. GERD history of a peptic stricture.  Continue pantoprazole 40 mg daily long term and follow standard antireflux measures long term.   4. CAD S/P MI in 2009 and 2017.  Hold Plavix 5 days before procedure - will instruct when and how to resume after procedure. Low but real risk of cardiovascular event such as heart attack, stroke, embolism, thrombosis or ischemia/infarct of other organs off Plavix explained and need to seek urgent help if this occurs. The patient consents to proceed. Will communicate by phone or EMR with patient's prescribing provider to confirm that holding Plavix is reasonable in this case.

## 2018-08-13 NOTE — Telephone Encounter (Signed)
   Primary Cardiologist: Candee Furbish, MD  Chart reviewed as part of pre-operative protocol coverage. Dr. [name], can patient hold plavix? Hx of Coronary artery disease status post anterior MI both in 2009 and in 2017 - Plan was to continue with dual antiplatelet therapy for life.  Please forward your response to [facility] PREOP.   Thank you  Leanor Kail, PA 08/13/2018, 1:54 PM

## 2018-08-14 NOTE — Telephone Encounter (Signed)
Patient called back notified him of holding the plavix and aspirin for 5 days prior per Dr.Skains did not have any other questions.

## 2018-08-14 NOTE — Telephone Encounter (Signed)
Routing Dr. Kingsley Plan recommendation's to requesting provider.   Burtis Junes, RN, [facility] [address] [place], [place]  [address] [phone]

## 2018-08-14 NOTE — Telephone Encounter (Signed)
Left message for patient to return my call.

## 2018-08-26 ENCOUNTER — Other Ambulatory Visit: Payer: Self-pay | Admitting: Cardiology

## 2018-08-27 NOTE — Telephone Encounter (Signed)
Pt pharmacy is requesting refill on Rx that is not listed on medication list. Please address. Thank you.

## 2018-09-09 ENCOUNTER — Encounter: Payer: Self-pay | Admitting: Family Medicine

## 2018-09-09 ENCOUNTER — Ambulatory Visit (INDEPENDENT_AMBULATORY_CARE_PROVIDER_SITE_OTHER): Payer: 59 | Admitting: Family Medicine

## 2018-09-09 VITALS — BP 132/76 | HR 80 | Temp 97.6°F | Ht 70.0 in | Wt 165.0 lb

## 2018-09-09 DIAGNOSIS — F339 Major depressive disorder, recurrent, unspecified: Secondary | ICD-10-CM

## 2018-09-09 DIAGNOSIS — F32A Depression, unspecified: Secondary | ICD-10-CM

## 2018-09-09 DIAGNOSIS — R5383 Other fatigue: Secondary | ICD-10-CM | POA: Diagnosis not present

## 2018-09-09 DIAGNOSIS — F329 Major depressive disorder, single episode, unspecified: Secondary | ICD-10-CM

## 2018-09-09 LAB — TESTOSTERONE: TESTOSTERONE: 272.92 ng/dL — AB (ref 300.00–890.00)

## 2018-09-09 MED ORDER — BUPROPION HCL ER (XL) 300 MG PO TB24: 300.0000 mg | ORAL_TABLET | Freq: Every day | ORAL | 0 refills | Status: DC

## 2018-09-09 NOTE — Assessment & Plan Note (Signed)
-  Stable but symptoms could be better especially regarding lack of interest -Increase bupropion to 300mg  daily , continue lexapro.  -Check testosterone levels as well to be sure this isn't contributing.

## 2018-09-09 NOTE — Progress Notes (Signed)
Jeremiah Short - 57 y.o. adult MRN [id number]  Date of birth: [birth year]  Subjective Chief Complaint  Patient presents with  . Follow-up    Has been tolerating Lexapro and Wellbutrin-has not noticed a difference.     HPI Jeremiah Short is a 57 y.o. adult here today for follow up of depression.  He was started on lexapro in addition to bupropion previously.  He has not noticed a big difference since starting this medication.  He denies side effects related to current medication.  The biggest component to his depression is his anhedonia.  He tells me that he isn't motivated to do much after getting home from work and that sometimes its a struggle to complete the work day.  His anxiety has seemed to have improved some.   Depression screen [facility] 2/9 07/10/2018 05/30/2018 05/02/2018  Decreased Interest 3 2 3   Down, Depressed, Hopeless 1 1 3   PHQ - 2 Score 4 3 6   Altered sleeping 1 1 1   Tired, decreased energy 1 2 2   Change in appetite - 0 2  Feeling bad or failure about yourself  2 1 3   Trouble concentrating 1 1 2   Moving slowly or fidgety/restless 0 1 0  Suicidal thoughts 0 0 0  PHQ-9 Score 9 9 16    generalized anxiety disorder GAD 7 : Generalized Anxiety Score 07/10/2018 05/30/2018 05/02/2018  Nervous, Anxious, on Edge 1 2 3   Control/stop worrying 0 2 1  Worry too much - different things 0 1 1  Trouble relaxing 1 2 2   Restless 0 1 1  Easily annoyed or irritable 2 3 2   Afraid - awful might happen 0 1 0  Total GAD 7 Score 4 12 10      Allergies  Allergen Reactions  . Morphine And Related     dont remember the symptoms    Past Medical History:  Diagnosis Date  . Anxiety   . Cardiomyopathy in other diseases classified elsewhere   . Coronary artery disease   . Depression   . Diverticulosis   . Esophageal stricture   . GERD (gastroesophageal reflux disease)   . History of ETOH abuse   . Hypertension   . Myocardial infarction ([place]) 2009  . Pure hypercholesterolemia   . Smoker   .  Stented coronary artery   . Tubular adenoma of colon 2012  . Wears glasses     Past Surgical History:  Procedure Laterality Date  . APPENDECTOMY  2011   during colectomy  . CARDIAC CATHETERIZATION  2009   placed 2 stents  . CARDIAC CATHETERIZATION N/A 09/27/2016   Procedure: Left Heart Cath and Coronary Angiography;  Surgeon: [name], MD;  Location: [place] [facility];  Service: Cardiovascular;  Laterality: N/A;  . CARDIAC CATHETERIZATION N/A 09/27/2016   Procedure: Coronary Stent Intervention;  Surgeon: [name], MD;  Location: [place] [facility];  Service: Cardiovascular;  Laterality: N/A;  . CORONARY STENT PLACEMENT    . HERNIA REPAIR    . INGUINAL HERNIA REPAIR Left 09/16/2013   Procedure: HERNIA REPAIR INGUINAL ADULT;  Surgeon: [name], MD;  Location: [place];  Service: General;  Laterality: Left;  . MOUTH SURGERY    . PARTIAL COLECTOMY  2011   sigmoid  . TONSILLECTOMY    . WISDOM TOOTH EXTRACTION      Social History   Socioeconomic History  . Marital status: Divorced    Spouse name: Not on file  . Number of children: 2  .  Years of education: Not on file  . Highest education level: Not on file  Occupational History  . Occupation: Glass blower/designer  Social Needs  . Financial resource strain: Not on file  . Food insecurity:    Worry: Not on file    Inability: Not on file  . Transportation needs:    Medical: Not on file    Non-medical: Not on file  Tobacco Use  . Smoking status: Current Every Day Smoker    Packs/day: 0.75    Years: 40.00    Pack years: 30.00    Types: E-cigarettes, Cigarettes  . Smokeless tobacco: Never Used  . Tobacco comment: Pt given handout to quit smoking  Substance and Sexual Activity  . Alcohol use: Yes    Alcohol/week: 90.0 standard drinks    Types: 90 Shots of liquor per week    Comment: 1/5 vodka every 3 days (750 ml)  . Drug use: No  . Sexual activity: Not on file  Lifestyle  . Physical activity:      Days per week: Not on file    Minutes per session: Not on file  . Stress: Not on file  Relationships  . Social connections:    Talks on phone: Not on file    Gets together: Not on file    Attends religious service: Not on file    Active member of club or organization: Not on file    Attends meetings of clubs or organizations: Not on file    Relationship status: Not on file  Other Topics Concern  . Not on file  Social History Narrative  . Not on file    Family History  Problem Relation Age of Onset  . Lung cancer Mother   . Heart disease Father        has pacemaker  . Breast cancer Sister   . Colon cancer Neg Hx   . Colon polyps Neg Hx   . Esophageal cancer Neg Hx   . Kidney disease Neg Hx   . Diabetes Neg Hx   . Stomach cancer Neg Hx     Health Maintenance  Topic Date Due  . HIV Screening  01/28/1975  . PAP SMEAR  01/27/1981  . MAMMOGRAM  01/27/2010  . COLONOSCOPY  09/30/2019  . TETANUS/TDAP  09/06/2024  . INFLUENZA VACCINE  Completed  . Hepatitis C Screening  Completed    ----------------------------------------------------------------------------------------------------------------------------------------------------------------------------------------------------------------- Physical Exam BP 132/76   Pulse 80   Temp 97.6 F (36.4 C) (Oral)   Ht 5\' 10"  (1.778 m)   Wt 165 lb (74.8 kg)   SpO2 96%   BMI 23.68 kg/m   Physical Exam  Constitutional: He is oriented to person, place, and time. He appears well-nourished. No distress.  HENT:  Head: Normocephalic and atraumatic.  Eyes: No scleral icterus.  Neck: Neck supple.  Cardiovascular: Normal rate, regular rhythm and normal heart sounds.  Pulmonary/Chest: Effort normal and breath sounds normal.  Neurological: He is alert and oriented to person, place, and time.  Skin: Skin is warm and dry.  Psychiatric: He has a normal mood and affect. His behavior is normal.     ------------------------------------------------------------------------------------------------------------------------------------------------------------------------------------------------------------------- Assessment and Plan  Depression, recurrent ([place]) -Stable but symptoms could be better especially regarding lack of interest -Increase bupropion to 300mg  daily , continue lexapro.  -Check testosterone levels as well to be sure this isn't contributing.

## 2018-09-09 NOTE — Patient Instructions (Signed)
-  Increase bupropion to 300mg  daily -Continue lexapro -See me again in 8-10 weeks.

## 2018-09-16 NOTE — Progress Notes (Signed)
Testosterone is a little low but not low enough that I would recommend treatment at this time.

## 2018-09-17 ENCOUNTER — Encounter: Payer: Self-pay | Admitting: Gastroenterology

## 2018-09-24 ENCOUNTER — Ambulatory Visit (AMBULATORY_SURGERY_CENTER): Payer: 59 | Admitting: Gastroenterology

## 2018-09-24 ENCOUNTER — Encounter: Payer: Self-pay | Admitting: Gastroenterology

## 2018-09-24 VITALS — BP 132/94 | HR 88 | Temp 98.6°F | Resp 25 | Ht 70.0 in | Wt 165.0 lb

## 2018-09-24 DIAGNOSIS — K642 Third degree hemorrhoids: Secondary | ICD-10-CM | POA: Diagnosis not present

## 2018-09-24 DIAGNOSIS — D124 Benign neoplasm of descending colon: Secondary | ICD-10-CM | POA: Diagnosis not present

## 2018-09-24 DIAGNOSIS — D123 Benign neoplasm of transverse colon: Secondary | ICD-10-CM

## 2018-09-24 DIAGNOSIS — D122 Benign neoplasm of ascending colon: Secondary | ICD-10-CM | POA: Diagnosis not present

## 2018-09-24 DIAGNOSIS — K921 Melena: Secondary | ICD-10-CM

## 2018-09-24 DIAGNOSIS — K5730 Diverticulosis of large intestine without perforation or abscess without bleeding: Secondary | ICD-10-CM

## 2018-09-24 DIAGNOSIS — D120 Benign neoplasm of cecum: Secondary | ICD-10-CM

## 2018-09-24 DIAGNOSIS — Z86010 Personal history of colonic polyps: Secondary | ICD-10-CM

## 2018-09-24 MED ORDER — SODIUM CHLORIDE 0.9 % IV SOLN: 500.0000 mL | Freq: Once | INTRAVENOUS | Status: DC

## 2018-09-24 NOTE — Patient Instructions (Signed)
Handouts given on polyps and diverticulosis and hemorrhoids.  YOU HAD AN ENDOSCOPIC PROCEDURE TODAY AT [facility]:   Refer to the procedure report that was given to you for any specific questions about what was found during the examination.  If the procedure report does not answer your questions, please call your gastroenterologist to clarify.  If you requested that your care partner not be given the details of your procedure findings, then the procedure report has been included in a sealed envelope for you to review at your convenience later.  YOU SHOULD EXPECT: Some feelings of bloating in the abdomen. Passage of more gas than usual.  Walking can help get rid of the air that was put into your GI tract during the procedure and reduce the bloating. If you had a lower endoscopy (such as a colonoscopy or flexible sigmoidoscopy) you may notice spotting of blood in your stool or on the toilet paper. If you underwent a bowel prep for your procedure, you may not have a normal bowel movement for a few days.  Please Note:  You might notice some irritation and congestion in your nose or some drainage.  This is from the oxygen used during your procedure.  There is no need for concern and it should clear up in a day or so.  SYMPTOMS TO REPORT IMMEDIATELY:   Following lower endoscopy (colonoscopy or flexible sigmoidoscopy):  Excessive amounts of blood in the stool  Significant tenderness or worsening of abdominal pains  Swelling of the abdomen that is new, acute  Fever of 100F or higher   For urgent or emergent issues, a gastroenterologist can be reached at any hour by calling [phone].   DIET:  We do recommend a small meal at first, but then you may proceed to your regular diet.  Drink plenty of fluids but you should avoid alcoholic beverages for 24 hours.  ACTIVITY:  You should plan to take it easy for the rest of today and you should NOT DRIVE or use heavy machinery until  tomorrow (because of the sedation medicines used during the test).    FOLLOW UP: Our staff will call the number listed on your records the next business day following your procedure to check on you and address any questions or concerns that you may have regarding the information given to you following your procedure. If we do not reach you, we will leave a message.  However, if you are feeling well and you are not experiencing any problems, there is no need to return our call.  We will assume that you have returned to your regular daily activities without incident.  If any biopsies were taken you will be contacted by phone or by letter within the next 1-3 weeks.  Please call us at [phone] if you have not heard about the biopsies in 3 weeks.    SIGNATURES/CONFIDENTIALITY: You and/or your care partner have signed paperwork which will be entered into your electronic medical record.  These signatures attest to the fact that that the information above on your After Visit Summary has been reviewed and is understood.  Full responsibility of the confidentiality of this discharge information lies with you and/or your care-partner.

## 2018-09-24 NOTE — Op Note (Addendum)
Jeremiah Short Patient Name: Jeremiah Short Procedure Date: 09/24/2018 7:47 AM MRN: [id number] Endoscopist: Ladene Artist , MD Age: 57 Referring MD:  Date of Birth: 1961-06-06 Gender: Male Account #: [id number] Procedure:                Colonoscopy Indications:              Hematochezia Medicines:                Monitored Anesthesia Care Procedure:                Pre-Anesthesia Assessment:                           - Prior to the procedure, a History and Physical                            was performed, and patient medications and                            allergies were reviewed. The patient's tolerance of                            previous anesthesia was also reviewed. The risks                            and benefits of the procedure and the sedation                            options and risks were discussed with the patient.                            All questions were answered, and informed consent                            was obtained. Prior Anticoagulants: The patient has                            taken Plavix (clopidogrel), last dose was 5 days                            prior to procedure. ASA Grade Assessment: III - A                            patient with severe systemic disease. After                            reviewing the risks and benefits, the patient was                            deemed in satisfactory condition to undergo the                            procedure.  After obtaining informed consent, the colonoscope                            was passed under direct vision. Throughout the                            procedure, the patient's blood pressure, pulse, and                            oxygen saturations were monitored continuously. The                            Colonoscope was introduced through the anus and                            advanced to the the cecum, identified by                            appendiceal  orifice and ileocecal valve. The                            ileocecal valve, appendiceal orifice, and rectum                            were photographed. The quality of the bowel                            preparation was good. The colonoscopy was performed                            without difficulty. The patient tolerated the                            procedure well. Scope In: 8:05:48 AM Scope Out: 8:25:58 AM Scope Withdrawal Time: 0 hours 18 minutes 8 seconds  Total Procedure Duration: 0 hours 20 minutes 10 seconds  Findings:                 The perianal and digital rectal examinations were                            normal.                           Six sessile polyps were found in the descending                            colon (1), transverse colon (4) and cecum (1). The                            polyps were 6 to 8 mm in size. These polyps were                            removed with a cold snare. Resection and retrieval  were complete.                           Two sessile polyps were found in the descending                            colon and ascending colon. The polyps were 10 to 14                            mm in size. These polyps were removed with a hot                            snare. Resection and retrieval were complete.                           There was evidence of a prior end-to-end                            colo-colonic anastomosis in the sigmoid colon. This                            was patent and was characterized by healthy                            appearing mucosa. The anastomosis was traversed.                           Multiple medium-mouthed diverticula were found in                            the entire colon.                           Internal hemorrhoids were found during                            retroflexion. The hemorrhoids were medium-sized and                            Grade III (internal hemorrhoids that prolapse  but                            require manual reduction).                           The exam was otherwise without abnormality on                            direct and retroflexion views. Complications:            No immediate complications. Estimated blood loss:                            None. Estimated Blood Loss:     Estimated blood loss: none. Impression:               -  Six 6 to 8 mm polyps in the descending colon, in                            the transverse colon and in the cecum, removed with                            a cold snare. Resected and retrieved.                           - Two 10 to 14 mm polyps in the descending colon                            and in the ascending colon, removed with a hot                            snare. Resected and retrieved.                           - Prior anastomosis sigmoid colon.                           - Diverticulosis in the entire examined colon.                           - Internal hemorrhoids.                           - The examination was otherwise normal on direct                            and retroflexion views. Recommendation:           - Repeat colonoscopy in 3 years for surveillance.                           - Resume Plavix (clopidogrel) tomorrow at prior                            dose. Refer to managing physician for further                            adjustment of therapy.                           - Patient has a contact number available for                            emergencies. The signs and symptoms of potential                            delayed complications were discussed with the                            patient. Return to normal activities tomorrow.  Written discharge instructions were provided to the                            patient.                           - High fiber diet.                           - Continue present medications.                           - Await pathology  results.                           - No aspirin, ibuprofen, naproxen, or other                            non-steroidal anti-inflammatory drugs for 2 weeks                            after polyp removal.                           - Schedule hemorrhoid banding. Ladene Artist, MD 09/24/2018 8:32:45 AM This report has been signed electronically.

## 2018-09-24 NOTE — Progress Notes (Signed)
Called to room to assist during endoscopic procedure.  Patient ID and intended procedure confirmed with present staff. Received instructions for my participation in the procedure from the performing physician.  

## 2018-09-24 NOTE — Progress Notes (Signed)
To PACU, VSS. Report to [email or web address] 

## 2018-09-26 ENCOUNTER — Telehealth: Payer: Self-pay | Admitting: *Deleted

## 2018-09-26 NOTE — Telephone Encounter (Signed)
  Follow up Call-  Call back number 09/24/2018  Post procedure Call Back phone  # [phone]  Some recent data might be hidden    [facility]

## 2018-10-03 ENCOUNTER — Telehealth: Payer: Self-pay | Admitting: Cardiology

## 2018-10-03 NOTE — Telephone Encounter (Signed)
Called and spoke with patient who had questions about whether or not he is supposed to be taking Losartan. Advised I do not see any documentation where this medication was discontinued and according to our records it should be continued.  Pt states understanding and states he will have it refilled.  Pt thanked me for the call back and information.

## 2018-10-03 NOTE — Telephone Encounter (Signed)
Patient would like to speak to someone to go over his medication list.

## 2018-10-07 ENCOUNTER — Encounter: Payer: Self-pay | Admitting: Gastroenterology

## 2018-10-10 ENCOUNTER — Other Ambulatory Visit: Payer: Self-pay | Admitting: Family Medicine

## 2018-11-18 ENCOUNTER — Ambulatory Visit: Payer: 59 | Admitting: Family Medicine

## 2018-11-27 ENCOUNTER — Other Ambulatory Visit: Payer: Self-pay | Admitting: Cardiology

## 2018-12-16 ENCOUNTER — Other Ambulatory Visit: Payer: Self-pay

## 2018-12-17 ENCOUNTER — Encounter: Payer: Self-pay | Admitting: Family Medicine

## 2018-12-17 ENCOUNTER — Ambulatory Visit (INDEPENDENT_AMBULATORY_CARE_PROVIDER_SITE_OTHER): Payer: 59 | Admitting: Family Medicine

## 2018-12-17 DIAGNOSIS — I10 Essential (primary) hypertension: Secondary | ICD-10-CM | POA: Diagnosis not present

## 2018-12-17 DIAGNOSIS — F321 Major depressive disorder, single episode, moderate: Secondary | ICD-10-CM | POA: Diagnosis not present

## 2018-12-17 DIAGNOSIS — F331 Major depressive disorder, recurrent, moderate: Secondary | ICD-10-CM | POA: Insufficient documentation

## 2018-12-17 MED ORDER — VORTIOXETINE HBR 10 MG PO TABS: 10.0000 mg | ORAL_TABLET | Freq: Every day | ORAL | 0 refills | Status: DC

## 2018-12-17 NOTE — Assessment & Plan Note (Signed)
-  Wean from bupropion and lexapro as he hasn't had much improvement with these. -Start trintellix, if unable to get approved with discussed trying fluoxetine -Follow up in 8 weeks.

## 2018-12-17 NOTE — Patient Instructions (Addendum)
Weaning from bupropion-Take 1/2 tab daily for 5 days, then 1/2 tab every other day for 5 days Weaning from lexapro-Take 1/2 tab daily for 5 days, then 1/2 tab every other day for 7 days.    Start trintellix after weaning from other medications.  See me in about 8 weeks after starting trintellix.

## 2018-12-17 NOTE — Assessment & Plan Note (Signed)
-  having some symptoms of hypotension, holding coreg for now.  -he will f/u with cardiologist to discussed remaining off of this.

## 2018-12-17 NOTE — Progress Notes (Signed)
Jeremiah Short - 58 y.o. adult MRN [id number]  Date of birth: 1960-12-21  Subjective Chief Complaint  Patient presents with  . Follow-up    10 Wk fatigue due to depression, "about the same"     HPI Jeremiah Short is a 58 y.o. adult here today for follow up of HTN, depression and anxiety.  Since last seen by me he had an open fracture of the finger, repaired by orthopedics.    He reports that symptoms are stable on current medication.  He denies any worsening of symptoms.  He is still experiencing some fatigue and anhedonia. He would like to consider trying something different for management of his symptoms.  He did recently d/c his coreg due to dizziness and hypotension at home.  Blood pressure remains well controlled off coreg.  He will discuss with his cardiologist whether he wants to substitute anything for this medication.  He has not had anginal symptoms, shortness of breath, palpitations.  He does continue to smoke, not interested in trying to quit at this time.  Depression screen [facility] 2/9 12/17/2018 07/10/2018 05/30/2018  Decreased Interest 3 3 2   Down, Depressed, Hopeless 2 1 1   PHQ - 2 Score 5 4 3   Altered sleeping 2 1 1   Tired, decreased energy 1 1 2   Change in appetite 1 - 0  Feeling bad or failure about yourself  2 2 1   Trouble concentrating 2 1 1   Moving slowly or fidgety/restless 0 0 1  Suicidal thoughts 0 0 0  PHQ-9 Score 13 9 9    GAD 7 : Generalized Anxiety Score 12/17/2018 07/10/2018 05/30/2018 05/02/2018  Nervous, Anxious, on Edge 1 1 2 3   Control/stop worrying 1 0 2 1  Worry too much - different things 1 0 1 1  Trouble relaxing 2 1 2 2   Restless 1 0 1 1  Easily annoyed or irritable 2 2 3 2   Afraid - awful might happen 0 0 1 0  Total GAD 7 Score 8 4 12 10        ROS:  A comprehensive ROS was completed and negative except as noted per HPI  Allergies  Allergen Reactions  . Morphine And Related     dont remember the symptoms    Past Medical History:  Diagnosis Date   . Anxiety   . Cardiomyopathy in other diseases classified elsewhere   . Coronary artery disease   . Depression   . Diverticulosis   . Esophageal stricture   . GERD (gastroesophageal reflux disease)   . History of ETOH abuse   . Hypertension   . Myocardial infarction ([place]) 2009  . Pure hypercholesterolemia   . Smoker   . Stented coronary artery   . Tubular adenoma of colon 2012  . Wears glasses     Past Surgical History:  Procedure Laterality Date  . APPENDECTOMY  2011   during colectomy  . CARDIAC CATHETERIZATION  2009   placed 2 stents  . CARDIAC CATHETERIZATION N/A 09/27/2016   Procedure: Left Heart Cath and Coronary Angiography;  Surgeon: [name], MD;  Location: [place] [facility];  Service: Cardiovascular;  Laterality: N/A;  . CARDIAC CATHETERIZATION N/A 09/27/2016   Procedure: Coronary Stent Intervention;  Surgeon: [name], MD;  Location: [place] [facility];  Service: Cardiovascular;  Laterality: N/A;  . CORONARY STENT PLACEMENT    . HERNIA REPAIR    . INGUINAL HERNIA REPAIR Left 09/16/2013   Procedure: HERNIA REPAIR INGUINAL ADULT;  Surgeon: [name],  MD;  Location: [place];  Service: General;  Laterality: Left;  . MOUTH SURGERY    . PARTIAL COLECTOMY  2011   sigmoid  . TONSILLECTOMY    . WISDOM TOOTH EXTRACTION      Social History   Socioeconomic History  . Marital status: Divorced    Spouse name: Not on file  . Number of children: 2  . Years of education: Not on file  . Highest education level: Not on file  Occupational History  . Occupation: Glass blower/designer  Social Needs  . Financial resource strain: Not on file  . Food insecurity:    Worry: Not on file    Inability: Not on file  . Transportation needs:    Medical: Not on file    Non-medical: Not on file  Tobacco Use  . Smoking status: Current Every Day Smoker    Packs/day: 0.75    Years: 40.00    Pack years: 30.00    Types: E-cigarettes, Cigarettes  . Smokeless  tobacco: Never Used  . Tobacco comment: Pt given handout to quit smoking  Substance and Sexual Activity  . Alcohol use: Yes    Alcohol/week: 90.0 standard drinks    Types: 90 Shots of liquor per week    Comment: 1/5 vodka every 3 days (750 ml)  . Drug use: No  . Sexual activity: Not on file  Lifestyle  . Physical activity:    Days per week: Not on file    Minutes per session: Not on file  . Stress: Not on file  Relationships  . Social connections:    Talks on phone: Not on file    Gets together: Not on file    Attends religious service: Not on file    Active member of club or organization: Not on file    Attends meetings of clubs or organizations: Not on file    Relationship status: Not on file  Other Topics Concern  . Not on file  Social History Narrative  . Not on file    Family History  Problem Relation Age of Onset  . Lung cancer Mother   . Heart disease Father        has pacemaker  . Breast cancer Sister   . Colon cancer Neg Hx   . Colon polyps Neg Hx   . Esophageal cancer Neg Hx   . Kidney disease Neg Hx   . Diabetes Neg Hx   . Stomach cancer Neg Hx     Health Maintenance  Topic Date Due  . HIV Screening  01/28/1975  . PAP SMEAR-Modifier  01/27/1981  . MAMMOGRAM  01/27/2010  . COLONOSCOPY  09/24/2021  . TETANUS/TDAP  09/06/2024  . INFLUENZA VACCINE  Completed  . Hepatitis C Screening  Completed    ----------------------------------------------------------------------------------------------------------------------------------------------------------------------------------------------------------------- Physical Exam There were no vitals taken for this visit.  Physical Exam Constitutional:      Appearance: Normal appearance.  HENT:     Head: Normocephalic and atraumatic.     Right Ear: Tympanic membrane normal.     Mouth/Throat:     Mouth: Mucous membranes are moist.  Eyes:     General: No scleral icterus. Neck:     Musculoskeletal: Neck  supple.  Cardiovascular:     Rate and Rhythm: Normal rate and regular rhythm.  Pulmonary:     Effort: Pulmonary effort is normal.     Breath sounds: Normal breath sounds.  Skin:    General: Skin is warm and  dry.     Findings: No rash.  Neurological:     General: No focal deficit present.     Mental Status: She is alert.  Psychiatric:        Mood and Affect: Mood normal.        Behavior: Behavior normal.     ------------------------------------------------------------------------------------------------------------------------------------------------------------------------------------------------------------------- Assessment and Plan  MDD (major depressive disorder), single episode, moderate (HCC) -Wean from bupropion and lexapro as he hasn't had much improvement with these. -Start trintellix, if unable to get approved with discussed trying fluoxetine -Follow up in 8 weeks.   Hypertension -having some symptoms of hypotension, holding coreg for now.  -he will f/u with cardiologist to discussed remaining off of this.   >25 minutes spent with patient face to face with at least 50% of time counseling patient on management of depression and treatment options as outlined above.

## 2018-12-19 ENCOUNTER — Other Ambulatory Visit: Payer: Self-pay | Admitting: Cardiology

## 2018-12-20 ENCOUNTER — Telehealth: Payer: Self-pay | Admitting: Family Medicine

## 2018-12-20 NOTE — Telephone Encounter (Signed)
Trintellix card available for pick up

## 2018-12-20 NOTE — Telephone Encounter (Signed)
Called Pt to pick up rebate card. Also called [facility] to give information, but was on hold for 10 mins.

## 2018-12-20 NOTE — Telephone Encounter (Signed)
Copied from [place] [phone]. Topic: General - Other >> Dec 20, 2018  2:21 PM Lennox Solders wrote: Reason for CRM: [facility] pharm at [facility] in [place] left voicemail on refill line. The trintellix is expensive and pt ask pharm to call to see if he can get something else

## 2018-12-24 ENCOUNTER — Other Ambulatory Visit: Payer: Self-pay | Admitting: Family Medicine

## 2018-12-24 MED ORDER — FLUOXETINE HCL 20 MG PO TABS: 20.0000 mg | ORAL_TABLET | Freq: Every day | ORAL | 3 refills | Status: DC

## 2019-01-02 ENCOUNTER — Other Ambulatory Visit: Payer: Self-pay | Admitting: Nurse Practitioner

## 2019-01-02 DIAGNOSIS — R0789 Other chest pain: Secondary | ICD-10-CM

## 2019-01-02 DIAGNOSIS — I2510 Atherosclerotic heart disease of native coronary artery without angina pectoris: Secondary | ICD-10-CM

## 2019-01-23 ENCOUNTER — Other Ambulatory Visit: Payer: Self-pay | Admitting: Nurse Practitioner

## 2019-01-23 DIAGNOSIS — I2510 Atherosclerotic heart disease of native coronary artery without angina pectoris: Secondary | ICD-10-CM

## 2019-01-23 DIAGNOSIS — R0789 Other chest pain: Secondary | ICD-10-CM

## 2019-01-27 ENCOUNTER — Other Ambulatory Visit: Payer: Self-pay

## 2019-01-27 DIAGNOSIS — F321 Major depressive disorder, single episode, moderate: Secondary | ICD-10-CM

## 2019-01-27 MED ORDER — FLUOXETINE HCL 20 MG PO TABS: 20.0000 mg | ORAL_TABLET | Freq: Every day | ORAL | 0 refills | Status: DC

## 2019-01-27 NOTE — Telephone Encounter (Signed)
[facility] over request for 90 day supply . Sent to Dr. Zigmund Daniel for review.

## 2019-01-28 ENCOUNTER — Other Ambulatory Visit: Payer: Self-pay | Admitting: Nurse Practitioner

## 2019-01-28 DIAGNOSIS — R0789 Other chest pain: Secondary | ICD-10-CM

## 2019-01-28 DIAGNOSIS — I2510 Atherosclerotic heart disease of native coronary artery without angina pectoris: Secondary | ICD-10-CM

## 2019-02-22 ENCOUNTER — Other Ambulatory Visit: Payer: Self-pay | Admitting: Family Medicine

## 2019-02-26 ENCOUNTER — Ambulatory Visit (INDEPENDENT_AMBULATORY_CARE_PROVIDER_SITE_OTHER): Payer: 59 | Admitting: Family Medicine

## 2019-02-26 ENCOUNTER — Encounter: Payer: Self-pay | Admitting: Family Medicine

## 2019-02-26 DIAGNOSIS — F321 Major depressive disorder, single episode, moderate: Secondary | ICD-10-CM

## 2019-02-26 MED ORDER — VENLAFAXINE HCL ER 75 MG PO CP24: 75.0000 mg | ORAL_CAPSULE | Freq: Every day | ORAL | 1 refills | Status: DC

## 2019-02-26 NOTE — Progress Notes (Signed)
Jeremiah Short - 58 y.o. adult MRN [id number]  Date of birth: [birth year]   This visit type was conducted due to national recommendations for restrictions regarding the COVID-19 Pandemic (e.g. social distancing).  This format is felt to be most appropriate for this patient at this time.  All issues noted in this document were discussed and addressed.  No physical exam was performed (except for noted visual exam findings with Video Visits).  I discussed the limitations of evaluation and management by telemedicine and the availability of in person appointments. The patient expressed understanding and agreed to proceed.  I connected with@ on 02/26/19 at  8:00 AM EDT by a video enabled telemedicine application and verified that I am speaking with the correct person using two identifiers.   Patient Location: Home [place] [address]   Provider location:   [facility]  Chief Complaint  Patient presents with  . Follow-up    10 wk F/U MDD/Anxiety , Meds not working, GAD7: 7, PHQ9: 16    HPI  Jeremiah Short is a 57 y.o. adult who presents via [employer], civil (consulting) for a telehealth visit today.  He is following up today for depression and anxiety.  At last visit he felt that his symptoms really hadn't improved with lexapro and bupropion even after titration.  We discussed trying trintellix however his insurance would not cover and it was too expensive even with discount card.  He was tried on fluoxetine and has been taking this for the past 8 weeks without improvement.  Still with low energy and limited motivation.    Depression screen [facility] 2/9 02/26/2019 12/17/2018 07/10/2018 05/30/2018 05/02/2018  Decreased Interest 3 3 3 2 3   Down, Depressed, Hopeless 2 2 1 1 3   PHQ - 2 Score 5 5 4 3 6   Altered sleeping 1 2 1 1 1   Tired, decreased energy 3 1 1 2 2   Change in appetite 0 1 - 0 2  Feeling bad or failure about yourself  3 2 2 1 3   Trouble concentrating 2 2 1 1 2   Moving  slowly or fidgety/restless 0 0 0 1 0  Suicidal thoughts 0 0 0 0 0  PHQ-9 Score 14 13 9 9 16   Difficult doing work/chores Extremely dIfficult - - - -   GAD 7 : Generalized Anxiety Score 02/26/2019 12/17/2018 07/10/2018 05/30/2018  Nervous, Anxious, on Edge 1 1 1 2   Control/stop worrying 2 1 0 2  Worry too much - different things 0 1 0 1  Trouble relaxing 1 2 1 2   Restless 0 1 0 1  Easily annoyed or irritable 3 2 2 3   Afraid - awful might happen 0 0 0 1  Total GAD 7 Score 7 8 4 12        ROS:  A comprehensive ROS was completed and negative except as noted per HPI  Past Medical History:  Diagnosis Date  . Anxiety   . Cardiomyopathy in other diseases classified elsewhere   . Coronary artery disease   . Depression   . Diverticulosis   . Esophageal stricture   . GERD (gastroesophageal reflux disease)   . History of ETOH abuse   . Hypertension   . Myocardial infarction ([place]) 2009  . Pure hypercholesterolemia   . Smoker   . Stented coronary artery   . Tubular adenoma of colon 2012  . Wears glasses     Past Surgical History:  Procedure Laterality Date  . APPENDECTOMY  2011  during colectomy  . CARDIAC CATHETERIZATION  2009   placed 2 stents  . CARDIAC CATHETERIZATION N/A 09/27/2016   Procedure: Left Heart Cath and Coronary Angiography;  Surgeon: [name], MD;  Location: [place] [facility];  Service: Cardiovascular;  Laterality: N/A;  . CARDIAC CATHETERIZATION N/A 09/27/2016   Procedure: Coronary Stent Intervention;  Surgeon: [name], MD;  Location: [place] [facility];  Service: Cardiovascular;  Laterality: N/A;  . CORONARY STENT PLACEMENT    . HERNIA REPAIR    . INGUINAL HERNIA REPAIR Left 09/16/2013   Procedure: HERNIA REPAIR INGUINAL ADULT;  Surgeon: [name], MD;  Location: [place];  Service: General;  Laterality: Left;  . MOUTH SURGERY    . PARTIAL COLECTOMY  2011   sigmoid  . TONSILLECTOMY    . WISDOM TOOTH EXTRACTION      Family  History  Problem Relation Age of Onset  . Lung cancer Mother   . Heart disease Father        has pacemaker  . Breast cancer Sister   . Colon cancer Neg Hx   . Colon polyps Neg Hx   . Esophageal cancer Neg Hx   . Kidney disease Neg Hx   . Diabetes Neg Hx   . Stomach cancer Neg Hx     Social History   Socioeconomic History  . Marital status: Divorced    Spouse name: Not on file  . Number of children: 2  . Years of education: Not on file  . Highest education level: Not on file  Occupational History  . Occupation: Glass blower/designer  Social Needs  . Financial resource strain: Not on file  . Food insecurity:    Worry: Not on file    Inability: Not on file  . Transportation needs:    Medical: Not on file    Non-medical: Not on file  Tobacco Use  . Smoking status: Current Every Day Smoker    Packs/day: 0.75    Years: 40.00    Pack years: 30.00    Types: E-cigarettes, Cigarettes  . Smokeless tobacco: Never Used  . Tobacco comment: Pt given handout to quit smoking  Substance and Sexual Activity  . Alcohol use: Yes    Alcohol/week: 90.0 standard drinks    Types: 90 Shots of liquor per week    Comment: 1/5 vodka every 3 days (750 ml)  . Drug use: No  . Sexual activity: Not on file  Lifestyle  . Physical activity:    Days per week: Not on file    Minutes per session: Not on file  . Stress: Not on file  Relationships  . Social connections:    Talks on phone: Not on file    Gets together: Not on file    Attends religious service: Not on file    Active member of club or organization: Not on file    Attends meetings of clubs or organizations: Not on file    Relationship status: Not on file  . Intimate partner violence:    Fear of current or ex partner: Not on file    Emotionally abused: Not on file    Physically abused: Not on file    Forced sexual activity: Not on file  Other Topics Concern  . Not on file  Social History Narrative  . Not on file     Current  Outpatient Medications:  .  aspirin EC 81 MG EC tablet, Take 1 tablet (81 mg total)  by mouth daily., Disp: , Rfl:  .  atorvastatin (LIPITOR) 80 MG tablet, TAKE 1 TABLET (80 MG TOTAL) BY MOUTH DAILY AT 6 PM., Disp: 90 tablet, Rfl: 1 .  carvedilol (COREG) 12.5 MG tablet, TAKE 1 TABLET BY MOUTH TWICE A DAY, Disp: 180 tablet, Rfl: 0 .  clopidogrel (PLAVIX) 75 MG tablet, TAKE 1 TABLET BY MOUTH EVERY DAY, Disp: 90 tablet, Rfl: 1 .  FLUoxetine (PROZAC) 20 MG tablet, Take 1 tablet (20 mg total) by mouth daily., Disp: 90 tablet, Rfl: 0 .  losartan (COZAAR) 50 MG tablet, Take 1 tablet (50 mg total) by mouth daily., Disp: 90 tablet, Rfl: 1 .  nitroGLYCERIN (NITROSTAT) 0.4 MG SL tablet, PLACE 1 TABLET UNDER THE TONGUE EVERY 5 MINUTES FOR 3 DOSES AS NEEDED FOR CHEST PAIN, Disp: 25 tablet, Rfl: 3 .  pantoprazole (PROTONIX) 40 MG tablet, TAKE 1 TABLET BY MOUTH EVERY DAY, Disp: 90 tablet, Rfl: 3 .  venlafaxine XR (EFFEXOR XR) 75 MG 24 hr capsule, Take 1 capsule (75 mg total) by mouth daily with breakfast., Disp: 90 capsule, Rfl: 1  EXAM:  VITALS per patient if applicable: Ht 5\' 10"  (1.778 m)   Wt 160 lb (72.6 kg)   BMI 22.96 kg/m   GENERAL: alert, oriented, appears well and in no acute distress  HEENT: atraumatic, conjunttiva clear, no obvious abnormalities on inspection of external nose and ears  NECK: normal movements of the head and neck  LUNGS: on inspection no signs of respiratory distress, breathing rate appears normal, no obvious gross SOB, gasping or wheezing  CV: no obvious cyanosis  MS: moves all visible extremities without noticeable abnormality  PSYCH/NEURO: pleasant and cooperative, no obvious depression or anxiety, speech and thought processing grossly intact  ASSESSMENT AND PLAN:  Discussed the following assessment and plan:  MDD (major depressive disorder), single episode, moderate (HCC) -Depression and anxiety are not well controlled. -Has not had improvement thus far with  lexapro, bupropion and fluoxetine most recently.  -We discussed trying effexor, will start 75mg .  If not seeing improvement with this we agreed that next step would be referral to psychiatry.  -F/u in 8 weeks.    >25 minutes spent with patient with 50% of time spent counseling and/or coordination of care as outlined above.      I discussed the assessment and treatment plan with the patient. The patient was provided an opportunity to ask questions and all were answered. The patient agreed with the plan and demonstrated an understanding of the instructions.   The patient was advised to call back or seek an in-person evaluation if the symptoms worsen or if the condition fails to improve as anticipated.     Luetta Nutting, DO

## 2019-02-26 NOTE — Assessment & Plan Note (Signed)
-  Depression and anxiety are not well controlled. -Has not had improvement thus far with lexapro, bupropion and fluoxetine most recently.  -We discussed trying effexor, will start 75mg .  If not seeing improvement with this we agreed that next step would be referral to psychiatry.  -F/u in 8 weeks.

## 2019-03-01 ENCOUNTER — Other Ambulatory Visit: Payer: Self-pay | Admitting: Nurse Practitioner

## 2019-03-01 DIAGNOSIS — I2510 Atherosclerotic heart disease of native coronary artery without angina pectoris: Secondary | ICD-10-CM

## 2019-03-01 DIAGNOSIS — R0789 Other chest pain: Secondary | ICD-10-CM

## 2019-03-05 ENCOUNTER — Encounter: Payer: Self-pay | Admitting: Family Medicine

## 2019-03-30 ENCOUNTER — Other Ambulatory Visit: Payer: Self-pay | Admitting: Nurse Practitioner

## 2019-03-30 ENCOUNTER — Other Ambulatory Visit: Payer: Self-pay | Admitting: Cardiology

## 2019-04-21 ENCOUNTER — Other Ambulatory Visit: Payer: Self-pay | Admitting: Nurse Practitioner

## 2019-04-23 ENCOUNTER — Ambulatory Visit: Payer: 59 | Admitting: Family Medicine

## 2019-04-30 ENCOUNTER — Encounter: Payer: Self-pay | Admitting: Family Medicine

## 2019-04-30 ENCOUNTER — Ambulatory Visit (INDEPENDENT_AMBULATORY_CARE_PROVIDER_SITE_OTHER): Payer: 59 | Admitting: Family Medicine

## 2019-04-30 VITALS — BP 132/99 | HR 101 | Temp 97.2°F | Ht 70.0 in | Wt 145.0 lb

## 2019-04-30 DIAGNOSIS — R7989 Other specified abnormal findings of blood chemistry: Secondary | ICD-10-CM

## 2019-04-30 DIAGNOSIS — F321 Major depressive disorder, single episode, moderate: Secondary | ICD-10-CM

## 2019-04-30 NOTE — Assessment & Plan Note (Signed)
Testosterone borderline low on previous check, will recheck early morning testosterone.

## 2019-04-30 NOTE — Assessment & Plan Note (Signed)
-  Effexor seems to be working well for him, we'll continue at current dose with follow up in 3 months.

## 2019-04-30 NOTE — Progress Notes (Signed)
Jeremiah Short - 58 y.o. male MRN [id number]  Date of birth: 1961-03-04   This visit type was conducted due to national recommendations for restrictions regarding the COVID-19 Pandemic (e.g. social distancing).  This format is felt to be most appropriate for this patient at this time.  All issues noted in this document were discussed and addressed.  No physical exam was performed (except for noted visual exam findings with Video Visits).  I discussed the limitations of evaluation and management by telemedicine and the availability of in person appointments. The patient expressed understanding and agreed to proceed.  I connected with@ on 04/30/19 at  9:00 AM EDT by a video enabled telemedicine application and verified that I am speaking with the correct person using two identifiers.   Patient Location: Home [place] [address]   Provider location:   [facility]  Chief Complaint  Patient presents with  . Follow-up    8 wk MDD, meds working , Refills?     HPI  Jeremiah Short is a 58 y.o. male who presents via audio/video conferencing for a telehealth visit today.  He is following up today for depression.  He was switched to effexor at previous appt and reports that this is working much better than other medications he had tried.  He has not noticed any significant side effects with this medication.  He does feel like he is having some muscle mass loss and wants to have testosterone rechecked again.    ROS:  A comprehensive ROS was completed and negative except as noted per HPI    ROS:  A comprehensive ROS was completed and negative except as noted per HPI  Past Medical History:  Diagnosis Date  . Anxiety   . Cardiomyopathy in other diseases classified elsewhere   . Coronary artery disease   . Depression   . Diverticulosis   . Esophageal stricture   . GERD (gastroesophageal reflux disease)   . History of ETOH abuse   . Hypertension   . Myocardial  infarction ([place]) 2009  . Pure hypercholesterolemia   . Smoker   . Stented coronary artery   . Tubular adenoma of colon 2012  . Wears glasses     Past Surgical History:  Procedure Laterality Date  . APPENDECTOMY  2011   during colectomy  . CARDIAC CATHETERIZATION  2009   placed 2 stents  . CARDIAC CATHETERIZATION N/A 09/27/2016   Procedure: Left Heart Cath and Coronary Angiography;  Surgeon: [name], MD;  Location: [facility];  Service: Cardiovascular;  Laterality: N/A;  . CARDIAC CATHETERIZATION N/A 09/27/2016   Procedure: Coronary Stent Intervention;  Surgeon: [name], MD;  Location: [facility];  Service: Cardiovascular;  Laterality: N/A;  . CORONARY STENT PLACEMENT    . HERNIA REPAIR    . INGUINAL HERNIA REPAIR Left 09/16/2013   Procedure: HERNIA REPAIR INGUINAL ADULT;  Surgeon: [name], MD;  Location: [facility];  Service: General;  Laterality: Left;  . MOUTH SURGERY    . PARTIAL COLECTOMY  2011   sigmoid  . TONSILLECTOMY    . WISDOM TOOTH EXTRACTION      Family History  Problem Relation Age of Onset  . Lung cancer Mother   . Heart disease Father        has pacemaker  . Breast cancer Sister   . Colon cancer Neg Hx   . Colon polyps Neg Hx   . Esophageal cancer Neg Hx   .  Kidney disease Neg Hx   . Diabetes Neg Hx   . Stomach cancer Neg Hx     Social History   Socioeconomic History  . Marital status: Divorced    Spouse name: Not on file  . Number of children: 2  . Years of education: Not on file  . Highest education level: Not on file  Occupational History  . Occupation: Glass blower/designer  Social Needs  . Financial resource strain: Not on file  . Food insecurity    Worry: Not on file    Inability: Not on file  . Transportation needs    Medical: Not on file    Non-medical: Not on file  Tobacco Use  . Smoking status: Current Every Day Smoker    Packs/day: 0.75    Years: 40.00    Pack years: 30.00    Types:  E-cigarettes, Cigarettes  . Smokeless tobacco: Never Used  . Tobacco comment: Pt given handout to quit smoking  Substance and Sexual Activity  . Alcohol use: Yes    Alcohol/week: 90.0 standard drinks    Types: 90 Shots of liquor per week    Comment: 1/5 vodka every 3 days (750 ml)  . Drug use: No  . Sexual activity: Not on file  Lifestyle  . Physical activity    Days per week: Not on file    Minutes per session: Not on file  . Stress: Not on file  Relationships  . Social Herbalist on phone: Not on file    Gets together: Not on file    Attends religious service: Not on file    Active member of club or organization: Not on file    Attends meetings of clubs or organizations: Not on file    Relationship status: Not on file  . Intimate partner violence    Fear of current or ex partner: Not on file    Emotionally abused: Not on file    Physically abused: Not on file    Forced sexual activity: Not on file  Other Topics Concern  . Not on file  Social History Narrative  . Not on file     Current Outpatient Medications:  .  aspirin EC 81 MG EC tablet, Take 1 tablet (81 mg total) by mouth daily., Disp: , Rfl:  .  atorvastatin (LIPITOR) 80 MG tablet, TAKE 1 TABLET (80 MG TOTAL) BY MOUTH DAILY AT 6 PM., Disp: 90 tablet, Rfl: 1 .  carvedilol (COREG) 12.5 MG tablet, TAKE 1 TABLET BY MOUTH TWICE A DAY, Disp: 180 tablet, Rfl: 0 .  clopidogrel (PLAVIX) 75 MG tablet, TAKE 1 TABLET BY MOUTH EVERY DAY, Disp: 90 tablet, Rfl: 1 .  isosorbide mononitrate (IMDUR) 60 MG 24 hr tablet, TAKE 1 TABLET BY MOUTH EVERY DAY, Disp: 90 tablet, Rfl: 1 .  losartan (COZAAR) 50 MG tablet, TAKE 1 TABLET (50 MG TOTAL) BY MOUTH DAILY., Disp: 90 tablet, Rfl: 2 .  nitroGLYCERIN (NITROSTAT) 0.4 MG SL tablet, PLACE 1 TABLET UNDER THE TONGUE EVERY 5 MINUTES FOR 3 DOSES AS NEEDED FOR CHEST PAIN, Disp: 25 tablet, Rfl: 3 .  pantoprazole (PROTONIX) 40 MG tablet, TAKE 1 TABLET BY MOUTH EVERY DAY, Disp: 90 tablet,  Rfl: 3 .  venlafaxine XR (EFFEXOR XR) 75 MG 24 hr capsule, Take 1 capsule (75 mg total) by mouth daily with breakfast., Disp: 90 capsule, Rfl: 1  EXAM:  VITALS per patient if applicable: BP (!) 314/97   Pulse (!) 101  Temp (!) 97.2 F (36.2 C) (Temporal)   Ht 5\' 10"  (1.778 m)   Wt 145 lb (65.8 kg)   BMI 20.81 kg/m   GENERAL: alert, oriented, appears well and in no acute distress  HEENT: atraumatic, conjunttiva clear, no obvious abnormalities on inspection of external nose and ears  NECK: normal movements of the head and neck  LUNGS: on inspection no signs of respiratory distress, breathing rate appears normal, no obvious gross SOB, gasping or wheezing  CV: no obvious cyanosis  MS: moves all visible extremities without noticeable abnormality  PSYCH/NEURO: pleasant and cooperative, no obvious depression or anxiety, speech and thought processing grossly intact  ASSESSMENT AND PLAN:  Discussed the following assessment and plan:  MDD (major depressive disorder), single episode, moderate (HCC) -Effexor seems to be working well for him, we'll continue at current dose with follow up in 3 months.    Low testosterone Testosterone borderline low on previous check, will recheck early morning testosterone.         I discussed the assessment and treatment plan with the patient. The patient was provided an opportunity to ask questions and all were answered. The patient agreed with the plan and demonstrated an understanding of the instructions.   The patient was advised to call back or seek an in-person evaluation if the symptoms worsen or if the condition fails to improve as anticipated.     Luetta Nutting, DO

## 2019-05-06 ENCOUNTER — Other Ambulatory Visit: Payer: Self-pay

## 2019-05-07 ENCOUNTER — Other Ambulatory Visit (INDEPENDENT_AMBULATORY_CARE_PROVIDER_SITE_OTHER): Payer: 59

## 2019-05-07 DIAGNOSIS — R7989 Other specified abnormal findings of blood chemistry: Secondary | ICD-10-CM

## 2019-05-07 DIAGNOSIS — E875 Hyperkalemia: Secondary | ICD-10-CM

## 2019-05-07 LAB — POTASSIUM: Potassium: 3.9 mEq/L (ref 3.5–5.1)

## 2019-05-07 LAB — TESTOSTERONE: Testosterone: 280.95 ng/dL — ABNORMAL LOW (ref 300.00–890.00)

## 2019-05-10 LAB — TESTOSTERONE, FREE: TESTOSTERONE FREE: 25.2 pg/mL — ABNORMAL LOW (ref 46.0–224.0)

## 2019-05-10 LAB — SEX HORMONE BINDING GLOBULIN: Sex Hormone Binding: 85 nmol/L — ABNORMAL HIGH (ref 22–77)

## 2019-05-12 ENCOUNTER — Other Ambulatory Visit: Payer: Self-pay | Admitting: Family Medicine

## 2019-05-15 NOTE — Progress Notes (Deleted)
{Choose 1 Note Type (Telehealth Visit or Telephone Visit):[phone]}   Date:  05/15/2019   ID:  Jeremiah Short, DOB 11/24/1960, MRN [id number]  {Patient Location:[phone]::"Home"} {Provider [phone]::"Home"}  PCP:  Luetta Nutting, DO  Cardiologist:  Candee Furbish, MD *** Electrophysiologist:  None   Evaluation Performed:  {Choose Visit Type:[id number]::"Follow-Up Visit"}  Chief Complaint:  ***  History of Present Illness:    Jeremiah Short is a 58 y.o. male with *** coronary artery disease status post PCI to the LAD in the setting of ST elevation myocardial infarction in 2009 and again in December 2017 with diagonal 2 jailed treated with angioplasty here for follow-up.  Plan was to continue with indefinite DAPT at the time.  Plavix and aspirin.  Occasionally may have some periodic chest discomfort.  Possibly GI.  Isosorbide was increased to 60 at last visit with Truitt Merle, NP 05/13/2018.  EF is also mildly reduced/moderately reduced 40 to 45%.  Still try to work on smoking.  He is contemplated this.  He knows that it is bad for him for instance but he is still smoking.  We discussed this at length today.  Denies any significant bleeding, he does have some easy bruising, external hemorrhoid with occasional spotting.  No fevers chills nausea vomiting syncope.  Chest pain is improved on increased Imdur as well as GERD medication.  Tobacco use  The patient {does/does not:200015} have symptoms concerning for COVID-19 infection (fever, chills, cough, or new shortness of breath).    Past Medical History:  Diagnosis Date  . Anxiety   . Cardiomyopathy in other diseases classified elsewhere   . Coronary artery disease   . Depression   . Diverticulosis   . Esophageal stricture   . GERD (gastroesophageal reflux disease)   . History of ETOH abuse   . Hypertension   . Myocardial infarction ([place]) 2009  . Pure hypercholesterolemia   . Smoker   . Stented coronary artery   .  Tubular adenoma of colon 2012  . Wears glasses    Past Surgical History:  Procedure Laterality Date  . APPENDECTOMY  2011   during colectomy  . CARDIAC CATHETERIZATION  2009   placed 2 stents  . CARDIAC CATHETERIZATION N/A 09/27/2016   Procedure: Left Heart Cath and Coronary Angiography;  Surgeon: [name], MD;  Location: [facility];  Service: Cardiovascular;  Laterality: N/A;  . CARDIAC CATHETERIZATION N/A 09/27/2016   Procedure: Coronary Stent Intervention;  Surgeon: [name], MD;  Location: [facility];  Service: Cardiovascular;  Laterality: N/A;  . CORONARY STENT PLACEMENT    . HERNIA REPAIR    . INGUINAL HERNIA REPAIR Left 09/16/2013   Procedure: HERNIA REPAIR INGUINAL ADULT;  Surgeon: [name], MD;  Location: [facility];  Service: General;  Laterality: Left;  . MOUTH SURGERY    . PARTIAL COLECTOMY  2011   sigmoid  . TONSILLECTOMY    . WISDOM TOOTH EXTRACTION       No outpatient medications have been marked as taking for the 05/16/19 encounter (Appointment) with Isaiah Serge, NP.     Allergies:   Morphine and related   Social History   Tobacco Use  . Smoking status: Current Every Day Smoker    Packs/day: 0.75    Years: 40.00    Pack years: 30.00    Types: E-cigarettes, Cigarettes  . Smokeless tobacco: Never Used  . Tobacco comment: Pt given handout to quit smoking  Substance Use Topics  .  Alcohol use: Yes    Alcohol/week: 90.0 standard drinks    Types: 90 Shots of liquor per week    Comment: 1/5 vodka every 3 days (750 ml)  . Drug use: No     Family Hx: The patient's family history includes Breast cancer in his sister; Heart disease in his father; Lung cancer in his mother. There is no history of Colon cancer, Colon polyps, Esophageal cancer, Kidney disease, Diabetes, or Stomach cancer.  ROS:   Please see the history of present illness.    *** All other systems reviewed and are negative.   Prior CV studies:   The  following studies were reviewed today:  ***  Labs/Other Tests and Data Reviewed:    EKG:  {EKG/Telemetry Strips Reviewed:[phone]}  Recent Labs: 05/07/2019: Potassium 3.9   Recent Lipid Panel Lab Results  Component Value Date/Time   CHOL 98 05/06/2018 09:14 AM   CHOL 111 07/17/2017 02:49 PM   TRIG 287.0 (H) 05/06/2018 09:14 AM   HDL 51.80 05/06/2018 09:14 AM   HDL 63 07/17/2017 02:49 PM   CHOLHDL 2 05/06/2018 09:14 AM   LDLCALC 29 07/17/2017 02:49 PM   LDLDIRECT 15.0 05/06/2018 09:14 AM    Wt Readings from Last 3 Encounters:  04/30/19 145 lb (65.8 kg)  02/26/19 160 lb (72.6 kg)  12/17/18 160 lb (72.6 kg)     Objective:    Vital Signs:  There were no vitals taken for this visit.   {[facility] Virtual Exam (Optional):[phone]::"VITAL SIGNS:  reviewed"}  ASSESSMENT & PLAN:    1. ***  COVID-19 Education: The signs and symptoms of COVID-19 were discussed with the patient and how to seek care for testing (follow up with PCP or arrange E-visit).  ***The importance of social distancing was discussed today.  Time:   Today, I have spent *** minutes with the patient with telehealth technology discussing the above problems.     Medication Adjustments/Labs and Tests Ordered: Current medicines are reviewed at length with the patient today.  Concerns regarding medicines are outlined above.   Tests Ordered: No orders of the defined types were placed in this encounter.   Medication Changes: No orders of the defined types were placed in this encounter.   Follow Up:  {F/U Format:[phone]} {follow up:15908}  Signed, Cecilie Kicks, NP  05/15/2019 10:06 PM    [facility]

## 2019-05-16 ENCOUNTER — Ambulatory Visit: Payer: 59 | Admitting: Cardiology

## 2019-06-11 ENCOUNTER — Other Ambulatory Visit: Payer: Self-pay | Admitting: Family Medicine

## 2019-06-24 ENCOUNTER — Other Ambulatory Visit: Payer: Self-pay

## 2019-06-24 ENCOUNTER — Ambulatory Visit (INDEPENDENT_AMBULATORY_CARE_PROVIDER_SITE_OTHER): Payer: 59 | Admitting: Family Medicine

## 2019-06-24 ENCOUNTER — Encounter: Payer: Self-pay | Admitting: Family Medicine

## 2019-06-24 VITALS — BP 126/100 | HR 102 | Temp 98.2°F | Ht 70.0 in | Wt 152.2 lb

## 2019-06-24 DIAGNOSIS — Z125 Encounter for screening for malignant neoplasm of prostate: Secondary | ICD-10-CM | POA: Diagnosis not present

## 2019-06-24 DIAGNOSIS — Z0000 Encounter for general adult medical examination without abnormal findings: Secondary | ICD-10-CM | POA: Diagnosis not present

## 2019-06-24 DIAGNOSIS — E7800 Pure hypercholesterolemia, unspecified: Secondary | ICD-10-CM | POA: Diagnosis not present

## 2019-06-24 DIAGNOSIS — R7989 Other specified abnormal findings of blood chemistry: Secondary | ICD-10-CM | POA: Diagnosis not present

## 2019-06-24 DIAGNOSIS — I10 Essential (primary) hypertension: Secondary | ICD-10-CM

## 2019-06-24 DIAGNOSIS — F321 Major depressive disorder, single episode, moderate: Secondary | ICD-10-CM

## 2019-06-24 DIAGNOSIS — Z23 Encounter for immunization: Secondary | ICD-10-CM

## 2019-06-24 LAB — COMPREHENSIVE METABOLIC PANEL
ALT: 21 U/L (ref 0–53)
AST: 31 U/L (ref 0–37)
Albumin: 3.8 g/dL (ref 3.5–5.2)
Alkaline Phosphatase: 58 U/L (ref 39–117)
BUN: 16 mg/dL (ref 6–23)
CO2: 31 mEq/L (ref 19–32)
Calcium: 8.9 mg/dL (ref 8.4–10.5)
Chloride: 102 mEq/L (ref 96–112)
Creatinine, Ser: 0.95 mg/dL (ref 0.40–1.50)
GFR: 81.03 mL/min (ref >60.00)
Glucose, Bld: 88 mg/dL (ref 70–99)
Potassium: 3.9 mEq/L (ref 3.5–5.1)
Sodium: 140 mEq/L (ref 135–145)
Total Bilirubin: 0.6 mg/dL (ref 0.2–1.2)
Total Protein: 6 g/dL (ref 6.0–8.3)

## 2019-06-24 LAB — TESTOSTERONE: Testosterone: 260.77 ng/dL — ABNORMAL LOW (ref 300.00–890.00)

## 2019-06-24 LAB — PSA: PSA: 1.08 ng/mL (ref 0.10–4.00)

## 2019-06-24 LAB — LIPID PANEL
Cholesterol: 135 mg/dL (ref 0–200)
HDL: 90 mg/dL (ref >39.00)
LDL Cholesterol: 33 mg/dL (ref 0–99)
NonHDL: 45.26
Total CHOL/HDL Ratio: 2
Triglycerides: 62 mg/dL (ref 0.0–149.0)
VLDL: 12.4 mg/dL (ref 0.0–40.0)

## 2019-06-24 LAB — CBC
HCT: 39.3 % (ref 39.0–52.0)
Hemoglobin: 13.1 g/dL (ref 13.0–17.0)
MCHC: 33.3 g/dL (ref 30.0–36.0)
MCV: 105.4 fl — ABNORMAL HIGH (ref 78.0–100.0)
Platelets: 124 10*3/uL — ABNORMAL LOW (ref 150.0–400.0)
RBC: 3.73 Mil/uL — ABNORMAL LOW (ref 4.22–5.81)
RDW: 13.3 % (ref 11.5–15.5)
WBC: 4.4 10*3/uL (ref 4.0–10.5)

## 2019-06-24 LAB — TSH: TSH: 1.31 u[IU]/mL (ref 0.35–4.50)

## 2019-06-24 NOTE — Progress Notes (Signed)
Jeremiah Short - 58 y.o. male MRN [id number]  Date of birth: Jun 27, 1961  Subjective Chief Complaint  Patient presents with  . Annual Exam    CPE/ not fasting/ wants flu shot     HPI Jeremiah Short is a 58 y.o. male with history of CAD, HTN, HLD depression and nicotine dependence here today for annual exam.  He reports he is doing well.  He denies any new concerns today.  His depression is better controlled with current medications.  He unfortunately continues to smoke, has not had success with bupropion or chantix.  He understands this increases his risk of another heart attack.  He does walk some for exercise and fairly active at work.  He does not see a dentist regularly.  He would like to have a flu vaccine today.   Review of Systems  Constitutional: Negative for chills, fever, malaise/fatigue and weight loss.  HENT: Negative for congestion, ear pain and sore throat.   Eyes: Negative for blurred vision, double vision and pain.  Respiratory: Negative for cough and shortness of breath.   Cardiovascular: Negative for chest pain and palpitations.  Gastrointestinal: Negative for abdominal pain, blood in stool, constipation, heartburn and nausea.  Genitourinary: Negative for dysuria and urgency.  Musculoskeletal: Negative for joint pain and myalgias.  Neurological: Negative for dizziness and headaches.  Endo/Heme/Allergies: Does not bruise/bleed easily.  Psychiatric/Behavioral: Negative for depression. The patient is not nervous/anxious and does not have insomnia.     Allergies  Allergen Reactions  . Morphine And Related     dont remember the symptoms    Past Medical History:  Diagnosis Date  . Anxiety   . Cardiomyopathy in other diseases classified elsewhere   . Coronary artery disease   . Depression   . Diverticulosis   . Esophageal stricture   . GERD (gastroesophageal reflux disease)   . History of ETOH abuse   . Hypertension   . Myocardial infarction ([place]) 2009  . Pure  hypercholesterolemia   . Smoker   . Stented coronary artery   . Tubular adenoma of colon 2012  . Wears glasses     Past Surgical History:  Procedure Laterality Date  . APPENDECTOMY  2011   during colectomy  . CARDIAC CATHETERIZATION  2009   placed 2 stents  . CARDIAC CATHETERIZATION N/A 09/27/2016   Procedure: Left Heart Cath and Coronary Angiography;  Surgeon: [name], MD;  Location: [facility];  Service: Cardiovascular;  Laterality: N/A;  . CARDIAC CATHETERIZATION N/A 09/27/2016   Procedure: Coronary Stent Intervention;  Surgeon: [name], MD;  Location: [facility];  Service: Cardiovascular;  Laterality: N/A;  . CORONARY STENT PLACEMENT    . HERNIA REPAIR    . INGUINAL HERNIA REPAIR Left 09/16/2013   Procedure: HERNIA REPAIR INGUINAL ADULT;  Surgeon: [name], MD;  Location: [facility];  Service: General;  Laterality: Left;  . MOUTH SURGERY    . PARTIAL COLECTOMY  2011   sigmoid  . TONSILLECTOMY    . WISDOM TOOTH EXTRACTION      Social History   Socioeconomic History  . Marital status: Divorced    Spouse name: Not on file  . Number of children: 2  . Years of education: Not on file  . Highest education level: Not on file  Occupational History  . Occupation: Glass blower/designer  Social Needs  . Financial resource strain: Not on file  . Food insecurity    Worry: Not on file  Inability: Not on file  . Transportation needs    Medical: Not on file    Non-medical: Not on file  Tobacco Use  . Smoking status: Current Every Day Smoker    Packs/day: 0.75    Years: 40.00    Pack years: 30.00    Types: E-cigarettes, Cigarettes  . Smokeless tobacco: Never Used  . Tobacco comment: Pt given handout to quit smoking  Substance and Sexual Activity  . Alcohol use: Yes    Alcohol/week: 90.0 standard drinks    Types: 90 Shots of liquor per week    Comment: 1/5 vodka every 3 days (750 ml)  . Drug use: No  . Sexual activity: Not on file   Lifestyle  . Physical activity    Days per week: Not on file    Minutes per session: Not on file  . Stress: Not on file  Relationships  . Social Herbalist on phone: Not on file    Gets together: Not on file    Attends religious service: Not on file    Active member of club or organization: Not on file    Attends meetings of clubs or organizations: Not on file    Relationship status: Not on file  Other Topics Concern  . Not on file  Social History Narrative  . Not on file    Family History  Problem Relation Age of Onset  . Lung cancer Mother   . Heart disease Father        has pacemaker  . Breast cancer Sister   . Colon cancer Neg Hx   . Colon polyps Neg Hx   . Esophageal cancer Neg Hx   . Kidney disease Neg Hx   . Diabetes Neg Hx   . Stomach cancer Neg Hx     Health Maintenance  Topic Date Due  . HIV Screening  01/28/1975  . INFLUENZA VACCINE  05/03/2019  . COLONOSCOPY  09/24/2021  . TETANUS/TDAP  10/23/2028  . Hepatitis C Screening  Completed    ----------------------------------------------------------------------------------------------------------------------------------------------------------------------------------------------------------------- Physical Exam BP (!) 126/100   Pulse (!) 102   Temp 98.2 F (36.8 C) (Oral)   Ht '5\' 10"'  (1.778 m)   Wt 152 lb 3.2 oz (69 kg)   SpO2 98%   BMI 21.84 kg/m   Physical Exam Constitutional:      General: He is not in acute distress. HENT:     Head: Normocephalic and atraumatic.     Right Ear: Tympanic membrane and external ear normal.     Left Ear: Tympanic membrane and external ear normal.     Mouth/Throat:     Mouth: Mucous membranes are moist.  Eyes:     General: No scleral icterus. Neck:     Musculoskeletal: Normal range of motion and neck supple.     Thyroid: No thyromegaly.  Cardiovascular:     Rate and Rhythm: Normal rate and regular rhythm.     Heart sounds: Normal heart sounds.   Pulmonary:     Effort: Pulmonary effort is normal.     Breath sounds: Normal breath sounds.  Abdominal:     General: Abdomen is flat. Bowel sounds are normal. There is no distension.     Palpations: Abdomen is soft.     Tenderness: There is no abdominal tenderness. There is no guarding.  Lymphadenopathy:     Cervical: No cervical adenopathy.  Skin:    General: Skin is warm and dry.  Findings: No lesion or rash.  Neurological:     General: No focal deficit present.     Mental Status: He is alert and oriented to person, place, and time.     Cranial Nerves: No cranial nerve deficit.     Motor: No abnormal muscle tone.  Psychiatric:        Mood and Affect: Mood normal.        Behavior: Behavior normal.     ------------------------------------------------------------------------------------------------------------------------------------------------------------------------------------------------------------------- Assessment and Plan  Well adult exam Well adult Orders Placed This Encounter  Procedures  . Flu Vaccine QUAD 6+ mos PF IM (Fluarix Quad PF)  . Comp Met (CMET)  . CBC  . Lipid panel  . TSH  . PSA  . Testosterone  Screenings: PSA Immunizations: flu vaccine.  Anticipatory guidance/Risk factor reduction:  He was counseled on smoking cessation.  Recommend annual dental exam, healthy diet and exercise.  Additional recommendations per AVS.    Low testosterone -Update testosterone levels today.   MDD (major depressive disorder), single episode, moderate (HCC) -Stable, continue current medication

## 2019-06-24 NOTE — Assessment & Plan Note (Signed)
Well adult Orders Placed This Encounter  Procedures  . Flu Vaccine QUAD 6+ mos PF IM (Fluarix Quad PF)  . Comp Met (CMET)  . CBC  . Lipid panel  . TSH  . PSA  . Testosterone  Screenings: PSA Immunizations: flu vaccine.  Anticipatory guidance/Risk factor reduction:  He was counseled on smoking cessation.  Recommend annual dental exam, healthy diet and exercise.  Additional recommendations per AVS.

## 2019-06-24 NOTE — Assessment & Plan Note (Signed)
-  Update testosterone levels today.

## 2019-06-24 NOTE — Patient Instructions (Signed)
Preventive Care 40-59 Years Old, Male Preventive care refers to lifestyle choices and visits with your health care provider that can promote health and wellness. This includes:  A yearly physical exam. This is also called an annual well check.  Regular dental and eye exams.  Immunizations.  Screening for certain conditions.  Healthy lifestyle choices, such as eating a healthy diet, getting regular exercise, not using drugs or products that contain nicotine and tobacco, and limiting alcohol use. What can I expect for my preventive care visit? Physical exam Your health care provider will check:  Height and weight. These may be used to calculate body mass index (BMI), which is a measurement that tells if you are at a healthy weight.  Heart rate and blood pressure.  Your skin for abnormal spots. Counseling Your health care provider may ask you questions about:  Alcohol, tobacco, and drug use.  Emotional well-being.  Home and relationship well-being.  Sexual activity.  Eating habits.  Work and work environment. What immunizations do I need?  Influenza (flu) vaccine  This is recommended every year. Tetanus, diphtheria, and pertussis (Tdap) vaccine  You may need a Td booster every 10 years. Varicella (chickenpox) vaccine  You may need this vaccine if you have not already been vaccinated. Zoster (shingles) vaccine  You may need this after age 60. Measles, mumps, and rubella (MMR) vaccine  You may need at least one dose of MMR if you were born in 1957 or later. You may also need a second dose. Pneumococcal conjugate (PCV13) vaccine  You may need this if you have certain conditions and were not previously vaccinated. Pneumococcal polysaccharide (PPSV23) vaccine  You may need one or two doses if you smoke cigarettes or if you have certain conditions. Meningococcal conjugate (MenACWY) vaccine  You may need this if you have certain conditions. Hepatitis A vaccine   You may need this if you have certain conditions or if you travel or work in places where you may be exposed to hepatitis A. Hepatitis B vaccine  You may need this if you have certain conditions or if you travel or work in places where you may be exposed to hepatitis B. Haemophilus influenzae type b (Hib) vaccine  You may need this if you have certain risk factors. Human papillomavirus (HPV) vaccine  If recommended by your health care provider, you may need three doses over 6 months. You may receive vaccines as individual doses or as more than one vaccine together in one shot (combination vaccines). Talk with your health care provider about the risks and benefits of combination vaccines. What tests do I need? Blood tests  Lipid and cholesterol levels. These may be checked every 5 years, or more frequently if you are over 50 years old.  Hepatitis C test.  Hepatitis B test. Screening  Lung cancer screening. You may have this screening every year starting at age 55 if you have a 30-pack-year history of smoking and currently smoke or have quit within the past 15 years.  Prostate cancer screening. Recommendations will vary depending on your family history and other risks.  Colorectal cancer screening. All adults should have this screening starting at age 50 and continuing until age 75. Your health care provider may recommend screening at age 45 if you are at increased risk. You will have tests every 1-10 years, depending on your results and the type of screening test.  Diabetes screening. This is done by checking your blood sugar (glucose) after you have not eaten   for a while (fasting). You may have this done every 1-3 years.  Sexually transmitted disease (STD) testing. Follow these instructions at home: Eating and drinking  Eat a diet that includes fresh fruits and vegetables, whole grains, lean protein, and low-fat dairy products.  Take vitamin and mineral supplements as recommended  by your health care provider.  Do not drink alcohol if your health care provider tells you not to drink.  If you drink alcohol: ? Limit how much you have to 0-2 drinks a day. ? Be aware of how much alcohol is in your drink. In the [place] one drink equals one 12 oz bottle of beer (355 mL), one 5 oz glass of wine (148 mL), or one 1 oz glass of hard liquor (44 mL). Lifestyle  Take daily care of your teeth and gums.  Stay active. Exercise for at least 30 minutes on 5 or more days each week.  Do not use any products that contain nicotine or tobacco, such as cigarettes, e-cigarettes, and chewing tobacco. If you need help quitting, ask your health care provider.  If you are sexually active, practice safe sex. Use a condom or other form of protection to prevent STIs (sexually transmitted infections).  Talk with your health care provider about taking a low-dose aspirin every day starting at age 33. What's next?  Go to your health care provider once a year for a well check visit.  Ask your health care provider how often you should have your eyes and teeth checked.  Stay up to date on all vaccines. This information is not intended to replace advice given to you by your health care provider. Make sure you discuss any questions you have with your health care provider. Document Released: 10/15/2015 Document Revised: 09/12/2018 Document Reviewed: 09/12/2018 Elsevier Patient Education  2020 [employer].

## 2019-06-24 NOTE — Assessment & Plan Note (Signed)
Stable, continue current medication.

## 2019-07-16 ENCOUNTER — Other Ambulatory Visit: Payer: Self-pay | Admitting: Nurse Practitioner

## 2019-07-24 ENCOUNTER — Ambulatory Visit (INDEPENDENT_AMBULATORY_CARE_PROVIDER_SITE_OTHER): Payer: 59 | Admitting: Cardiology

## 2019-07-24 ENCOUNTER — Encounter: Payer: Self-pay | Admitting: Cardiology

## 2019-07-24 ENCOUNTER — Other Ambulatory Visit: Payer: Self-pay

## 2019-07-24 VITALS — BP 128/94 | HR 85 | Ht 70.0 in | Wt 153.8 lb

## 2019-07-24 DIAGNOSIS — I209 Angina pectoris, unspecified: Secondary | ICD-10-CM | POA: Diagnosis not present

## 2019-07-24 DIAGNOSIS — Z720 Tobacco use: Secondary | ICD-10-CM | POA: Diagnosis not present

## 2019-07-24 DIAGNOSIS — I2510 Atherosclerotic heart disease of native coronary artery without angina pectoris: Secondary | ICD-10-CM | POA: Diagnosis not present

## 2019-07-24 DIAGNOSIS — I10 Essential (primary) hypertension: Secondary | ICD-10-CM

## 2019-07-24 DIAGNOSIS — E7800 Pure hypercholesterolemia, unspecified: Secondary | ICD-10-CM

## 2019-07-24 MED ORDER — CARVEDILOL 6.25 MG PO TABS: 6.2500 mg | ORAL_TABLET | Freq: Two times a day (BID) | ORAL | 3 refills | Status: DC

## 2019-07-24 NOTE — Progress Notes (Signed)
Cardiology Office Note:    Date:  07/24/2019   ID:  Jeremiah Short, DOB 1960/10/30, MRN [id number]  PCP:  Luetta Nutting, DO  Cardiologist:  Candee Furbish, MD  Electrophysiologist:  None   Referring MD: Luetta Nutting, DO     History of Present Illness:    Jeremiah Short is a 58 y.o. male with coronary artery disease status post PCI to the LAD in the setting of ST elevation myocardial infarction in 2009 and again in December 2017 with diagonal 2 jailed treated with angioplasty here for follow-up.  Plan was to continue with indefinite DAPT at the time.  Plavix and aspirin.  Occasionally may have some periodic chest discomfort.  Possibly GI.  Isosorbide was increased to 60 at last visit with Truitt Merle, NP 05/13/2018.  EF is also mildly reduced/moderately reduced 40 to 45%.  Still try to work on smoking.  He is contemplated this.  He knows that it is bad for him for instance but he is still smoking.  We discussed this at length today.  Denies any significant bleeding, he does have some easy bruising, external hemorrhoid with occasional spotting.  No fevers chills nausea vomiting syncope.  Chest pain is improved on increased Imdur as well as GERD medication.  07/24/2019-here for the follow-up of CAD.  Prior PCI to LAD in the setting of STEMI in 2009 and then again in December 27 with a second diagonal that was jailed treated with angioplasty.  Plan was to continue with dual antiplatelet therapy lifelong.  He is on both Plavix and aspirin.  EF was in the 45% range.  LDL 33.  Excellent.  Eating trail mix, nuts yesterday, took NTG. May need esophageal dilated he thinks. Chest pain mimics.  Overall no fevers chills nausea vomiting syncope bleeding.  Still smoking.  Past Medical History:  Diagnosis Date   Anxiety    Cardiomyopathy in other diseases classified elsewhere    Coronary artery disease    Depression    Diverticulosis    Esophageal stricture    GERD (gastroesophageal  reflux disease)    History of ETOH abuse    Hypertension    Myocardial infarction [facility]) 2009   Pure hypercholesterolemia    Smoker    Stented coronary artery    Tubular adenoma of colon 2012   Wears glasses     Past Surgical History:  Procedure Laterality Date   APPENDECTOMY  2011   during colectomy   CARDIAC CATHETERIZATION  2009   placed 2 stents   CARDIAC CATHETERIZATION N/A 09/27/2016   Procedure: Left Heart Cath and Coronary Angiography;  Surgeon: [name], MD;  Location: [place] [facility];  Service: Cardiovascular;  Laterality: N/A;   CARDIAC CATHETERIZATION N/A 09/27/2016   Procedure: Coronary Stent Intervention;  Surgeon: [name], MD;  Location: [place] [facility];  Service: Cardiovascular;  Laterality: N/A;   CORONARY STENT PLACEMENT     HERNIA REPAIR     INGUINAL HERNIA REPAIR Left 09/16/2013   Procedure: HERNIA REPAIR INGUINAL ADULT;  Surgeon: [name], MD;  Location: [place];  Service: General;  Laterality: Left;   MOUTH SURGERY     PARTIAL COLECTOMY  2011   sigmoid   TONSILLECTOMY     WISDOM TOOTH EXTRACTION      Current Medications: Current Meds  Medication Sig   aspirin EC 81 MG EC tablet Take 1 tablet (81 mg total) by mouth daily.   atorvastatin (LIPITOR) 80 MG tablet TAKE 1  TABLET (80 MG TOTAL) BY MOUTH DAILY AT 6 PM.   clopidogrel (PLAVIX) 75 MG tablet TAKE 1 TABLET BY MOUTH EVERY DAY   FLUoxetine (PROZAC) 20 MG tablet TAKE 1 TABLET BY MOUTH EVERY DAY   isosorbide mononitrate (IMDUR) 60 MG 24 hr tablet TAKE 1 TABLET BY MOUTH EVERY DAY   losartan (COZAAR) 50 MG tablet TAKE 1 TABLET (50 MG TOTAL) BY MOUTH DAILY.   Multiple Vitamin (MULTIVITAMIN) capsule Take 1 capsule by mouth daily.   nitroGLYCERIN (NITROSTAT) 0.4 MG SL tablet PLACE 1 TABLET UNDER THE TONGUE EVERY 5 MINUTES FOR 3 DOSES AS NEEDED FOR CHEST PAIN   pantoprazole (PROTONIX) 40 MG tablet TAKE 1 TABLET BY MOUTH EVERY DAY   venlafaxine XR  (EFFEXOR-XR) 75 MG 24 hr capsule TAKE 1 CAPSULE (75 MG TOTAL) BY MOUTH DAILY WITH BREAKFAST.     Allergies:   Morphine and related   Social History   Socioeconomic History   Marital status: Divorced    Spouse name: Not on file   Number of children: 2   Years of education: Not on file   Highest education level: Not on file  Occupational History   Occupation: Glass blower/designer  Social Needs   Financial resource strain: Not on file   Food insecurity    Worry: Not on file    Inability: Not on file   Transportation needs    Medical: Not on file    Non-medical: Not on file  Tobacco Use   Smoking status: Current Every Day Smoker    Packs/day: 0.75    Years: 40.00    Pack years: 30.00    Types: E-cigarettes, Cigarettes   Smokeless tobacco: Never Used   Tobacco comment: Pt given handout to quit smoking  Substance and Sexual Activity   Alcohol use: Yes    Alcohol/week: 90.0 standard drinks    Types: 90 Shots of liquor per week    Comment: 1/5 vodka every 3 days (750 ml)   Drug use: No   Sexual activity: Not on file  Lifestyle   Physical activity    Days per week: Not on file    Minutes per session: Not on file   Stress: Not on file  Relationships   Social connections    Talks on phone: Not on file    Gets together: Not on file    Attends religious service: Not on file    Active member of club or organization: Not on file    Attends meetings of clubs or organizations: Not on file    Relationship status: Not on file  Other Topics Concern   Not on file  Social History Narrative   Not on file     Family History: The patient's family history includes Breast cancer in his sister; Heart disease in his father; Lung cancer in his mother. There is no history of Colon cancer, Colon polyps, Esophageal cancer, Kidney disease, Diabetes, or Stomach cancer.  ROS:   Please see the history of present illness.     All other systems reviewed and are  negative.  EKGs/Labs/Other Studies Reviewed:    The following studies were reviewed today: Labs prior office note, EKG  ECHO 01/11/17: - Left ventricle: The cavity size was normal. Wall thickness was   increased in a pattern of mild LVH. Systolic function was mildly   reduced. The estimated ejection fraction was in the range of 45%   to 50%. No mural thrombus with Definity contrast. Distal  anterior, anteroseptal, apical and inferoapical akinesis   suggestive of infarct. Doppler parameters are consistent with   abnormal left ventricular relaxation (grade 1 diastolic   dysfunction). The E/e&' ratio is <8, suggesting normal LV filling   pressure. - Mitral valve: Mildly thickened leaflets . There was trivial   regurgitation. - Left atrium: The atrium was normal in size. - Systemic veins: The IVC measures >2.1 cm, but collapses more than   50%, suggesting an elevated RA pressure of 8 mmHg.  Impressions:  - Compared to a prior study in 10/2016, the LVEF has improved to   45-50%. There are persistent distal anterior, anteroseptal,   apical and inferoapical wall motion abnormalities consistent with   LAD territory infarct/scar.  EKG: 07/24/2019-sinus rhythm 58 old inferior infarct Q waves in two 3/2, subtle ST elevation in V2, V3 less than 1 mm 05/13/2018-sinus rhythm poor R wave progression, old anterior infarct pattern.  Personally reviewed  Recent Labs: 06/24/2019: ALT 21; BUN 16; Creatinine, Ser 0.95; Hemoglobin 13.1; Platelets 124.0; Potassium 3.9; Sodium 140; TSH 1.31  Recent Lipid Panel    Component Value Date/Time   CHOL 135 06/24/2019 0844   CHOL 111 07/17/2017 1449   TRIG 62.0 06/24/2019 0844   HDL 90.00 06/24/2019 0844   HDL 63 07/17/2017 1449   CHOLHDL 2 06/24/2019 0844   VLDL 12.4 06/24/2019 0844   LDLCALC 33 06/24/2019 0844   LDLCALC 29 07/17/2017 1449   LDLDIRECT 15.0 05/06/2018 0914    Physical Exam:    VS:  BP (!) 128/94    Pulse 85    Ht 5\' 10"  (1.778 m)     Wt 153 lb 12.8 oz (69.8 kg)    SpO2 98%    BMI 22.07 kg/m     Wt Readings from Last 3 Encounters:  07/24/19 153 lb 12.8 oz (69.8 kg)  06/24/19 152 lb 3.2 oz (69 kg)  04/30/19 145 lb (65.8 kg)     GEN: Well nourished, well developed, in no acute distress  HEENT: normal  Neck: no JVD, carotid bruits, or masses Cardiac: RRR; no murmurs, rubs, or gallops,no edema  Respiratory:  clear to auscultation bilaterally, normal work of breathing GI: soft, nontender, nondistended, + BS MS: no deformity or atrophy  Skin: warm and dry, no rash Neuro:  Alert and Oriented x 3, Strength and sensation are intact Psych: euthymic mood, full affect   ASSESSMENT:    1. Coronary artery disease involving native coronary artery of native heart, angina presence unspecified   2. Angina pectoris ([place])   3. Tobacco abuse   4. Essential hypertension   5. Pure hypercholesterolemia    PLAN:    In order of problems listed above:  Coronary artery disease status post anterior MI both in 2009 and in 2017 - Plan was to continue with dual antiplatelet therapy for life.  Watch for any signs of major bleeding.  Doing well.  Last hemoglobin 13.1 -Aggressive secondary risk factor prevention. -EF previously moderately reduced 45%.  Continue with carvedilol however we will decrease the dose to 6.25 mg twice a day.  He stated that he felt may be some mild dizziness with 12-1/2 twice a day.  He stopped this on his own previously.  Continuing with the losartan.  NYHA class I.  Angina - Improved with isosorbide 60.  Also improved with concomitant GERD medication.  No changes made.  Hyperlipidemia -Excellent LDL 33.  Continue with high intensity statin therapy.  Atorvastatin 80.  No side effects.  ALT 19.  Tobacco use -Continue encourage tobacco cessation.  GERD/esophageal stricture/Schatzki's ring -Improved after taking Protonix.  He wonders if he needs another dilatation.  He has had 2 in the past.  Sometimes after  eating foods like trail mix for instance he has chest discomfort.   Medication Adjustments/Labs and Tests Ordered: Current medicines are reviewed at length with the patient today.  Concerns regarding medicines are outlined above.  Orders Placed This Encounter  Procedures   EKG 12-Lead   Meds ordered this encounter  Medications   carvedilol (COREG) 6.25 MG tablet    Sig: Take 1 tablet (6.25 mg total) by mouth 2 (two) times daily.    Dispense:  180 tablet    Refill:  3    Patient Instructions  Medication Instructions:  1) START Carvedilol 6.25mg  twice daily  *If you need a refill on your cardiac medications before your next appointment, please call your pharmacy*  Lab Work: None If you have labs (blood work) drawn today and your tests are completely normal, you will receive your results only by:  [place] (if you have MyChart) OR  A paper copy in the mail If you have any lab test that is abnormal or we need to change your treatment, we will call you to review the results.  Testing/Procedures: None  Follow-Up: At [facility], you and your health needs are our priority.  As part of our continuing mission to provide you with exceptional heart care, we have created designated Provider Care Teams.  These Care Teams include your primary Cardiologist (physician) and Advanced Practice Providers (APPs -  Physician Assistants and Nurse Practitioners) who all work together to provide you with the care you need, when you need it.  Your next appointment:   12 months  The format for your next appointment:   Either In Person or Virtual  Provider:   You may see Candee Furbish, MD or one of the following Advanced Practice Providers on your designated Care Team:    Truitt Merle, NP  Cecilie Kicks, NP  Kathyrn Drown, NP   Other Instructions      Signed, Candee Furbish, MD  07/24/2019 8:52 AM    [place]

## 2019-07-24 NOTE — Patient Instructions (Signed)
Medication Instructions:  1) START Carvedilol 6.25mg  twice daily  *If you need a refill on your cardiac medications before your next appointment, please call your pharmacy*  Lab Work: None If you have labs (blood work) drawn today and your tests are completely normal, you will receive your results only by: [name] MyChart Message (if you have MyChart) OR . A paper copy in the mail If you have any lab test that is abnormal or we need to change your treatment, we will call you to review the results.  Testing/Procedures: None  Follow-Up: At [facility], you and your health needs are our priority.  As part of our continuing mission to provide you with exceptional heart care, we have created designated Provider Care Teams.  These Care Teams include your primary Cardiologist (physician) and Advanced Practice Providers (APPs -  Physician Assistants and Nurse Practitioners) who all work together to provide you with the care you need, when you need it.  Your next appointment:   12 months  The format for your next appointment:   Either In Person or Virtual  Provider:   You may see Candee Furbish, MD or one of the following Advanced Practice Providers on your designated Care Team:    Truitt Merle, NP  Cecilie Kicks, NP  Kathyrn Drown, NP   Other Instructions

## 2019-08-15 ENCOUNTER — Other Ambulatory Visit: Payer: Self-pay | Admitting: Family Medicine

## 2019-08-24 ENCOUNTER — Other Ambulatory Visit: Payer: Self-pay | Admitting: Cardiology

## 2019-09-04 ENCOUNTER — Other Ambulatory Visit: Payer: Self-pay | Admitting: Cardiology

## 2019-09-04 DIAGNOSIS — I2510 Atherosclerotic heart disease of native coronary artery without angina pectoris: Secondary | ICD-10-CM

## 2019-09-04 DIAGNOSIS — R0789 Other chest pain: Secondary | ICD-10-CM

## 2019-09-08 ENCOUNTER — Ambulatory Visit (INDEPENDENT_AMBULATORY_CARE_PROVIDER_SITE_OTHER): Payer: 59 | Admitting: Gastroenterology

## 2019-09-08 ENCOUNTER — Telehealth: Payer: Self-pay

## 2019-09-08 ENCOUNTER — Other Ambulatory Visit: Payer: Self-pay

## 2019-09-08 ENCOUNTER — Encounter: Payer: Self-pay | Admitting: Gastroenterology

## 2019-09-08 VITALS — BP 90/60 | HR 104 | Temp 98.0°F | Ht 70.0 in | Wt 145.2 lb

## 2019-09-08 DIAGNOSIS — K648 Other hemorrhoids: Secondary | ICD-10-CM

## 2019-09-08 DIAGNOSIS — K222 Esophageal obstruction: Secondary | ICD-10-CM | POA: Diagnosis not present

## 2019-09-08 DIAGNOSIS — R1319 Other dysphagia: Secondary | ICD-10-CM

## 2019-09-08 DIAGNOSIS — K642 Third degree hemorrhoids: Secondary | ICD-10-CM

## 2019-09-08 DIAGNOSIS — R1310 Dysphagia, unspecified: Secondary | ICD-10-CM

## 2019-09-08 DIAGNOSIS — Z1159 Encounter for screening for other viral diseases: Secondary | ICD-10-CM

## 2019-09-08 NOTE — Telephone Encounter (Signed)
[facility] Pre-operative Risk Assessment     Request for surgical clearance:     Endoscopy Procedure  What type of surgery is being performed?     EGD dilation  When is this surgery scheduled?     09/25/19  What type of clearance is required ?   Pharmacy  Are there any medications that need to be held prior to surgery and how long? Plavix x 5 days  Practice name and name of physician performing surgery?      [facility]  What is your office phone and fax number?      Phone- [phone]  [phone]  Anesthesia type (None, local, MAC, general) ?       MAC

## 2019-09-08 NOTE — Telephone Encounter (Signed)
   Primary Cardiologist: Candee Furbish, MD  Chart reviewed as part of pre-operative protocol coverage. Given past medical history and time since last visit, based on ACC/AHA guidelines, Jeremiah Short would be at acceptable risk for the planned procedure without further cardiovascular testing.   OK to hold Plavix 5 days pre op if needed.   I will route this recommendation to the requesting party via Epic fax function and remove from pre-op pool.  Please call with questions.  Kerin Ransom, PA-C 09/08/2019, 3:00 PM

## 2019-09-08 NOTE — Telephone Encounter (Signed)
Informed patient to hold Plavix 5 days prior to his procedure. Patient verbalized understanding.

## 2019-09-08 NOTE — Telephone Encounter (Signed)
Please hold Plavix for 5 days prior to esophageal dilatation secondary to bleeding risks associated with this if on Plavix. Cardiac risks should be minimal for such a brief hold. Candee Furbish, MD

## 2019-09-08 NOTE — Progress Notes (Addendum)
    History of Present Illness: This is a 58 year old male who relates frequent problems of rectal bleeding with bowel movements.  He manually reduces prolapsed hemorrhoids frequently. His bowel habits alternate between mild constipation with straining to frequent loose stools.  He has rectal bleeding with almost every bowel movement for the past year.  He has had progressively worsening solid food dysphagia particularly bothersome for the past year he is unable to eat solid foods and reports a 40 pound weight loss over the past year.  No reflux symptoms.  Colonoscopy 09/2018 - Six 6 to 8 mm polyps in the descending colon, in the transverse colon and in the cecum, removed with a cold snare. Resected and retrieved. - Two 10 to 14 mm polyps in the descending colon and in the ascending colon, removed with a hot snare. Resected and retrieved. - Prior anastomosis sigmoid colon. - Diverticulosis in the entire examined colon. - Internal hemorrhoids. - The examination was otherwise normal on direct and retroflexion views.  EGD 09/2014 1. Stricture at the gastroesophageal junction; dilated using savary dilators over guidewire 2. Mild gastritis in the gastric antrum and gastric body 3. The EGD otherwise appeared normal  Current Medications, Allergies, Past Medical History, Past Surgical History, Family History and Social History were reviewed in [employer] record.   Physical Exam: General: Well developed, well nourished, no acute distress Head: Normocephalic and atraumatic Eyes:  sclerae anicteric, EOMI Ears: Normal auditory acuity Mouth: No deformity or lesions Lungs: Clear throughout to auscultation Heart: Regular rate and rhythm; no murmurs, rubs or bruits Abdomen: Soft, non tender and non distended. No masses, hepatosplenomegaly or hernias noted. Normal Bowel sounds Rectal: no lesions, no tenderness, heme neg brown stool Musculoskeletal: Symmetrical with no gross  deformities  Pulses:  Normal pulses noted Extremities: No clubbing, cyanosis, edema or deformities noted Neurological: Alert oriented x 4, grossly nonfocal Psychological:  Alert and cooperative. Normal mood and affect   Assessment and Recommendations:  1. Dysphagia. History esophageal stricture. Weight loss. GERD. Suspected recurrent esophageal stricture.  Continue pantoprazole 40 mg daily.  Schedule EGD with dilation. The risks (including bleeding, perforation, infection, missed lesions, medication reactions and possible hospitalization or surgery if complications occur), benefits, and alternatives to endoscopy with possible biopsy and possible dilation were discussed with the patient and they consent to proceed.   2. Hematochezia. Grade III bleeding internal hemorrhoids. Schedule hemorrhoid banding on Plavix. Pt understands risks benefits alternatives and consents to proceed.   3. Personal history of adenomatous colon polyps.  A 3-year interval surveillance colonoscopy is recommended in December 2022.  4. Hold Plavix 5 days before EGD - will instruct when and how to resume after EGD. Low but real risk of cardiovascular event such as heart attack, stroke, embolism, thrombosis or ischemia/infarct of other organs off Plavix explained and need to seek urgent help if this occurs. The patient consents to proceed. Will communicate by phone or EMR with patient's prescribing provider to confirm that holding Plavix is reasonable in this case.

## 2019-09-08 NOTE — Patient Instructions (Signed)
You have been scheduled for an endoscopy. Please follow written instructions given to you at your visit today. If you use inhalers (even only as needed), please bring them with you on the day of your procedure.    Your hemorrhoid banding is appointment is this Wednesday 09/10/19 at 9:10am  Thank you for choosing me and [facility].  Pricilla Riffle. Dagoberto Ligas., MD., [facility] Regal

## 2019-09-08 NOTE — Telephone Encounter (Signed)
Dr [name] can you comment on holding Plavix for 5 days in this patient for EGD dilatation ? The patient has a history of CAD, s/p PCI to the LAD in the setting of STEMI in 2009 and again in December 2017 with Dx 2 jailed treated with angioplasty.  Plan was to continue with indefinite DAPT at the time.  Plavix and aspirin.  Please respond to the CV PREOP pool  Kerin Ransom PA-C 09/08/2019 11:33 AM

## 2019-09-10 ENCOUNTER — Encounter: Payer: Self-pay | Admitting: Gastroenterology

## 2019-09-10 ENCOUNTER — Ambulatory Visit (INDEPENDENT_AMBULATORY_CARE_PROVIDER_SITE_OTHER): Payer: 59 | Admitting: Gastroenterology

## 2019-09-10 VITALS — BP 96/64 | HR 102 | Temp 97.4°F | Ht 70.0 in | Wt 149.0 lb

## 2019-09-10 DIAGNOSIS — K642 Third degree hemorrhoids: Secondary | ICD-10-CM | POA: Diagnosis not present

## 2019-09-10 DIAGNOSIS — K648 Other hemorrhoids: Secondary | ICD-10-CM

## 2019-09-10 NOTE — Patient Instructions (Signed)
HEMORRHOID BANDING PROCEDURE    FOLLOW-UP CARE   1. The procedure you have had should have been relatively painless since the banding of the area involved does not have nerve endings and there is no pain sensation.  The rubber band cuts off the blood supply to the hemorrhoid and the band may fall off as soon as 48 hours after the banding (the band may occasionally be seen in the toilet bowl following a bowel movement). You may notice a temporary feeling of fullness in the rectum which should respond adequately to plain Tylenol or Motrin.  2. Following the banding, avoid strenuous exercise that evening and resume full activity the next day.  A sitz bath (soaking in a warm tub) or bidet is soothing, and can be useful for cleansing the area after bowel movements.     3. To avoid constipation, take two tablespoons of natural wheat bran, natural oat bran, flax, Benefiber or any over the counter fiber supplement and increase your water intake to 7-8 glasses daily.    4. Unless you have been prescribed anorectal medication, do not put anything inside your rectum for two weeks: No suppositories, enemas, fingers, etc.  5. Occasionally, you may have more bleeding than usual after the banding procedure.  This is often from the untreated hemorrhoids rather than the treated one.  Don't be concerned if there is a tablespoon or so of blood.  If there is more blood than this, lie flat with your bottom higher than your head and apply an ice pack to the area. If the bleeding does not stop within a half an hour or if you feel faint, call our office at [phone] or go to the emergency room.  6. Problems are not common; however, if there is a substantial amount of bleeding, severe pain, chills, fever or difficulty passing urine (very rare) or other problems, you should call us at [phone] or report to the nearest emergency room.  7. Do not stay seated continuously for more than 2-3 hours for a day or two  after the procedure.  Tighten your buttock muscles 10-15 times every two hours and take 10-15 deep breaths every 1-2 hours.  Do not spend more than a few minutes on the toilet if you cannot empty your bowel; instead re-visit the toilet at a later time.    Thank you for choosing me and [facility].  Malcolm T. Stark, Jr., MD., FACG  

## 2019-09-10 NOTE — Progress Notes (Signed)
PROCEDURE NOTE: The patient presents with symptomatic bleeding grade III hemorrhoids, requesting rubber band ligation of their hemorrhoidal disease.  All risks, benefits and alternative forms of therapy were described and informed consent was obtained. Patient remains on Plavix for banding. The risk benefits and alternatives to banding on or off Plavix discussed and patient consents to proceed while on Plavix.   The anorectum was pre-medicated during digital exam with 0.125% NTG and 5% lidocaine. In the Left Lateral Decubitus position anoscopic examination revealed grade III hemorrhoids in the RA > RP > LL positions.  The decision was made to band the RA internal hemorrhoid, and the [place] was used to perform band ligation without complication.  Digital anorectal examination was then performed to assure proper positioning of the band and to adjust the banded tissue as required. No adjustment was needed.  No complications were encountered and the patient tolerated the procedure well.  Dietary and behavioral recommendations were given and along with follow-up instructions.   Return for possible additional hemorrhoid banding in 2 - 3 weeks as required.  High fiber diet, Benefiber daily and at least 8 glasses of water daily. The patient was discharged home without pain or other post procedure problems.

## 2019-09-15 ENCOUNTER — Other Ambulatory Visit: Payer: Self-pay

## 2019-09-15 DIAGNOSIS — Z20822 Contact with and (suspected) exposure to covid-19: Secondary | ICD-10-CM

## 2019-09-17 ENCOUNTER — Other Ambulatory Visit: Payer: Self-pay | Admitting: Nurse Practitioner

## 2019-09-17 ENCOUNTER — Encounter: Payer: Self-pay | Admitting: Gastroenterology

## 2019-09-17 DIAGNOSIS — R0789 Other chest pain: Secondary | ICD-10-CM

## 2019-09-17 DIAGNOSIS — I2510 Atherosclerotic heart disease of native coronary artery without angina pectoris: Secondary | ICD-10-CM

## 2019-09-17 LAB — NOVEL CORONAVIRUS, NAA: SARS-CoV-2, NAA: NOT DETECTED

## 2019-09-23 ENCOUNTER — Encounter: Payer: 59 | Admitting: Gastroenterology

## 2019-09-25 ENCOUNTER — Encounter: Payer: 59 | Admitting: Gastroenterology

## 2019-09-25 DIAGNOSIS — S4350XA Sprain of unspecified acromioclavicular joint, initial encounter: Secondary | ICD-10-CM | POA: Insufficient documentation

## 2019-10-01 ENCOUNTER — Other Ambulatory Visit: Payer: Self-pay | Admitting: Gastroenterology

## 2019-10-01 ENCOUNTER — Ambulatory Visit (INDEPENDENT_AMBULATORY_CARE_PROVIDER_SITE_OTHER): Payer: 59

## 2019-10-01 DIAGNOSIS — Z1159 Encounter for screening for other viral diseases: Secondary | ICD-10-CM

## 2019-10-02 LAB — SARS CORONAVIRUS 2 (TAT 6-24 HRS): SARS Coronavirus 2: NEGATIVE

## 2019-10-06 ENCOUNTER — Other Ambulatory Visit: Payer: Self-pay

## 2019-10-06 ENCOUNTER — Encounter: Payer: Self-pay | Admitting: Gastroenterology

## 2019-10-06 ENCOUNTER — Ambulatory Visit: Payer: 59 | Admitting: Gastroenterology

## 2019-10-06 VITALS — BP 149/104 | HR 95 | Temp 98.1°F | Resp 21 | Ht 70.0 in | Wt 149.0 lb

## 2019-10-06 MED ORDER — SODIUM CHLORIDE 0.9 % IV SOLN: 500.0000 mL | Freq: Once | INTRAVENOUS | Status: DC

## 2019-10-06 NOTE — Progress Notes (Signed)
Pt's states no medical or surgical changes since previsit or office visit.  Patiet states he tore his ACL a few weeks ago on the right side.    YF - temp DT - vitals

## 2019-10-06 NOTE — Progress Notes (Signed)
Procedure cancelled d/t Plavix only held 2 days instead of the ordered 5 days per Dr. Fuller Plan. Upper Endoscopy rescheduled for Friday 10/10/19. Instructions printed and reviewed with patient. Patient aware to be NPO 3 hours before procedure on 10/10/19 and to continue to hold Plavix until procedure date/until further instructed by MD after procedure. Patient received no sedation this visit. Awake and alert and ambulated with daughter on d/c.

## 2019-10-06 NOTE — Progress Notes (Signed)
Case cacelled before meds given

## 2019-10-08 ENCOUNTER — Ambulatory Visit (INDEPENDENT_AMBULATORY_CARE_PROVIDER_SITE_OTHER): Payer: 59

## 2019-10-08 ENCOUNTER — Other Ambulatory Visit: Payer: Self-pay | Admitting: Gastroenterology

## 2019-10-08 DIAGNOSIS — Z1159 Encounter for screening for other viral diseases: Secondary | ICD-10-CM

## 2019-10-09 ENCOUNTER — Encounter: Payer: 59 | Admitting: Gastroenterology

## 2019-10-09 LAB — SARS CORONAVIRUS 2 (TAT 6-24 HRS): SARS Coronavirus 2: NEGATIVE

## 2019-10-10 ENCOUNTER — Encounter: Payer: Self-pay | Admitting: Gastroenterology

## 2019-10-10 ENCOUNTER — Ambulatory Visit (AMBULATORY_SURGERY_CENTER): Payer: 59 | Admitting: Gastroenterology

## 2019-10-10 ENCOUNTER — Other Ambulatory Visit: Payer: Self-pay

## 2019-10-10 VITALS — BP 108/68 | HR 108 | Temp 98.5°F | Resp 16 | Ht 70.0 in | Wt 145.0 lb

## 2019-10-10 DIAGNOSIS — R1310 Dysphagia, unspecified: Secondary | ICD-10-CM | POA: Diagnosis present

## 2019-10-10 DIAGNOSIS — K2950 Unspecified chronic gastritis without bleeding: Secondary | ICD-10-CM

## 2019-10-10 DIAGNOSIS — K222 Esophageal obstruction: Secondary | ICD-10-CM

## 2019-10-10 DIAGNOSIS — K2270 Barrett's esophagus without dysplasia: Secondary | ICD-10-CM

## 2019-10-10 DIAGNOSIS — R1319 Other dysphagia: Secondary | ICD-10-CM

## 2019-10-10 MED ORDER — SODIUM CHLORIDE 0.9 % IV SOLN: 500.0000 mL | Freq: Once | INTRAVENOUS | Status: DC

## 2019-10-10 NOTE — Progress Notes (Signed)
Report given to PACU, vss 

## 2019-10-10 NOTE — Progress Notes (Signed)
Called to room to assist during endoscopic procedure.  Patient ID and intended procedure confirmed with present staff. Received instructions for my participation in the procedure from the performing physician.  

## 2019-10-10 NOTE — Op Note (Signed)
[facility] Patient Name: Jeremiah Short Procedure Date: 10/10/2019 2:31 PM MRN: [id number] Endoscopist: Ladene Artist , MD Age: 59 Referring MD:  Date of Birth: 08-01-1961 Gender: Male Account #: [id number] Procedure:                Upper GI endoscopy Indications:              Dysphagia Medicines:                Monitored Anesthesia Care Procedure:                Pre-Anesthesia Assessment:                           - Prior to the procedure, a History and Physical                            was performed, and patient medications and                            allergies were reviewed. The patient's tolerance of                            previous anesthesia was also reviewed. The risks                            and benefits of the procedure and the sedation                            options and risks were discussed with the patient.                            All questions were answered, and informed consent                            was obtained. Prior Anticoagulants: The patient has                            taken Plavix (clopidogrel), last dose was 6 days                            prior to procedure. ASA Grade Assessment: II - A                            patient with mild systemic disease. After reviewing                            the risks and benefits, the patient was deemed in                            satisfactory condition to undergo the procedure.                           After obtaining informed consent, the endoscope was  passed under direct vision. Throughout the                            procedure, the patient's blood pressure, pulse, and                            oxygen saturations were monitored continuously. The                            Endoscope was introduced through the mouth, and                            advanced to the second part of duodenum. The upper                            GI endoscopy was accomplished  without difficulty.                            The patient tolerated the procedure well. Scope In: Scope Out: Findings:                 The Z-line was variable and was found 41 - 43 cm                            from the incisors. Biopsies were taken with a cold                            forceps for histology.                           One benign-appearing, intrinsic mild stenosis was                            found 43 cm from the incisors. This stenosis                            measured 1.4 cm (inner diameter). The stenosis was                            traversed. A guidewire was placed and the scope was                            withdrawn. Dilations were performed with Savary                            dilators with mild resistance at 14 mm, 15 mm and                            16 mm with a small amount of heme on each dilator                            following esophageal and gastric biopsies.  The exam of the esophagus was otherwise normal.                           Diffuse moderate inflammation characterized by                            erythema, friability and granularity was found in                            the entire examined stomach. Biopsies were taken                            with a cold forceps for histology.                           The exam of the stomach was otherwise normal.                           The duodenal bulb and second portion of the                            duodenum were normal. Complications:            No immediate complications. Estimated Blood Loss:     Estimated blood loss was minimal. Impression:               - Z-line variable, 41 - 43 cm from the incisors.                            Biopsied.                           - Benign-appearing esophageal stenosis. Dilated.                           - Gastritis. Biopsied.                           - Normal duodenal bulb and second portion of the                             duodenum. Recommendation:           - Patient has a contact number available for                            emergencies. The signs and symptoms of potential                            delayed complications were discussed with the                            patient. Return to normal activities tomorrow.                            Written discharge instructions were provided to the  patient.                           - Clear liquid diet for 2 hours, then advance as                            tolerated to soft diet today.                           - Resume prior diet tomorrow.                           - Follow antireflux measures.                           - Continue present medications.                           - Await pathology results.                           - Resume Plavix (clopidogrel) at prior dose                            tomorrow. Refer to managing physician for further                            adjustment of therapy.                           - Return to GI office in 6 weeks. Ladene Artist, MD 10/10/2019 2:58:15 PM This report has been signed electronically.

## 2019-10-10 NOTE — Progress Notes (Signed)
Temp by Levora Dredge by KA

## 2019-10-10 NOTE — Patient Instructions (Signed)
Thank you for allowing [place] to care for you today!  Await pathology results by mail, approximately 2 weeks.    Resume previous medications today with exception of Plavix.  Resume Plavix tomorrow Saturday 10/11/19 at prior dose.  Follow soft diet today.  Handout provided.  Return to GI clinic in 2 weeks.  If you don't from the office in one week, please call to make appointment.  Resume your normal activities tomorrow, Saturday 10/11/19.      YOU HAD AN ENDOSCOPIC PROCEDURE TODAY AT [facility]:   Refer to the procedure report that was given to you for any specific questions about what was found during the examination.  If the procedure report does not answer your questions, please call your gastroenterologist to clarify.  If you requested that your care partner not be given the details of your procedure findings, then the procedure report has been included in a sealed envelope for you to review at your convenience later.  YOU SHOULD EXPECT: Some feelings of bloating in the abdomen. Passage of more gas than usual.  Walking can help get rid of the air that was put into your GI tract during the procedure and reduce the bloating. If you had a lower endoscopy (such as a colonoscopy or flexible sigmoidoscopy) you may notice spotting of blood in your stool or on the toilet paper. If you underwent a bowel prep for your procedure, you may not have a normal bowel movement for a few days.  Please Note:  You might notice some irritation and congestion in your nose or some drainage.  This is from the oxygen used during your procedure.  There is no need for concern and it should clear up in a day or so.  SYMPTOMS TO REPORT IMMEDIATELY:     Following upper endoscopy (EGD)  Vomiting of blood or coffee ground material  New chest pain or pain under the shoulder blades  Painful or persistently difficult swallowing  New shortness of breath  Fever of 100F or higher  Black, tarry-looking  stools  For urgent or emergent issues, a gastroenterologist can be reached at any hour by calling [phone].   DIET:  We do recommend a small meal at first, but then you may proceed to your regular diet.  Drink plenty of fluids but you should avoid alcoholic beverages for 24 hours.  ACTIVITY:  You should plan to take it easy for the rest of today and you should NOT DRIVE or use heavy machinery until tomorrow (because of the sedation medicines used during the test).    FOLLOW UP: Our staff will call the number listed on your records 48-72 hours following your procedure to check on you and address any questions or concerns that you may have regarding the information given to you following your procedure. If we do not reach you, we will leave a message.  We will attempt to reach you two times.  During this call, we will ask if you have developed any symptoms of COVID 19. If you develop any symptoms (ie: fever, flu-like symptoms, shortness of breath, cough etc.) before then, please call [phone].  If you test positive for Covid 19 in the 2 weeks post procedure, please call and report this information to [place].    If any biopsies were taken you will be contacted by phone or by letter within the next 1-3 weeks.  Please call us at [phone] if you have not heard about the biopsies in  3 weeks.    SIGNATURES/CONFIDENTIALITY: You and/or your care partner have signed paperwork which will be entered into your electronic medical record.  These signatures attest to the fact that that the information above on your After Visit Summary has been reviewed and is understood.  Full responsibility of the confidentiality of this discharge information lies with you and/or your care-partner.

## 2019-10-14 ENCOUNTER — Telehealth: Payer: Self-pay | Admitting: *Deleted

## 2019-10-14 NOTE — Telephone Encounter (Signed)
  Follow up Call-  Call back number 10/10/2019 10/06/2019 09/24/2018  Post procedure Call Back phone  # [phone]  Permission to leave phone message Yes Yes -  Some recent data might be hidden     Patient questions:  Do you have a fever, pain , or abdominal swelling? No. Pain Score  0 *  Have you tolerated food without any problems? Yes.    Have you been able to return to your normal activities? Yes.    Do you have any questions about your discharge instructions: Diet   No. Medications  No. Follow up visit  Yes.    Do you have questions or concerns about your Care? No.  Actions: * If pain score is 4 or above: No action needed, pain <4.  Pt. Questioned about follow up appt. Told pt. Per report that he need to follow up in office in 6 weeks,verbalize understanding.   1. Have you developed a fever since your procedure? no  2.   Have you had an respiratory symptoms (SOB or cough) since your procedure? no  3.   Have you tested positive for COVID 19 since your procedure no  4.   Have you had any family members/close contacts diagnosed with the COVID 19 since your procedure?  no   If yes to any of these questions please route to Joylene John, RN and Alphonsa Gin, Therapist, sports.

## 2019-10-15 ENCOUNTER — Encounter: Payer: Self-pay | Admitting: Gastroenterology

## 2019-10-15 ENCOUNTER — Ambulatory Visit (INDEPENDENT_AMBULATORY_CARE_PROVIDER_SITE_OTHER): Payer: 59 | Admitting: Gastroenterology

## 2019-10-15 VITALS — BP 110/78 | HR 72 | Temp 97.6°F | Ht 70.0 in | Wt 146.0 lb

## 2019-10-15 DIAGNOSIS — K648 Other hemorrhoids: Secondary | ICD-10-CM

## 2019-10-15 DIAGNOSIS — K642 Third degree hemorrhoids: Secondary | ICD-10-CM | POA: Diagnosis not present

## 2019-10-15 NOTE — Patient Instructions (Signed)
HEMORRHOID BANDING PROCEDURE    FOLLOW-UP CARE   1. The procedure you have had should have been relatively painless since the banding of the area involved does not have nerve endings and there is no pain sensation.  The rubber band cuts off the blood supply to the hemorrhoid and the band may fall off as soon as 48 hours after the banding (the band may occasionally be seen in the toilet bowl following a bowel movement). You may notice a temporary feeling of fullness in the rectum which should respond adequately to plain Tylenol or Motrin.  2. Following the banding, avoid strenuous exercise that evening and resume full activity the next day.  A sitz bath (soaking in a warm tub) or bidet is soothing, and can be useful for cleansing the area after bowel movements.     3. To avoid constipation, take two tablespoons of natural wheat bran, natural oat bran, flax, Benefiber or any over the counter fiber supplement and increase your water intake to 7-8 glasses daily.    4. Unless you have been prescribed anorectal medication, do not put anything inside your rectum for two weeks: No suppositories, enemas, fingers, etc.  5. Occasionally, you may have more bleeding than usual after the banding procedure.  This is often from the untreated hemorrhoids rather than the treated one.  Don't be concerned if there is a tablespoon or so of blood.  If there is more blood than this, lie flat with your bottom higher than your head and apply an ice pack to the area. If the bleeding does not stop within a half an hour or if you feel faint, call our office at [phone] or go to the emergency room.  6. Problems are not common; however, if there is a substantial amount of bleeding, severe pain, chills, fever or difficulty passing urine (very rare) or other problems, you should call us at [phone] or report to the nearest emergency room.  7. Do not stay seated continuously for more than 2-3 hours for a day or two  after the procedure.  Tighten your buttock muscles 10-15 times every two hours and take 10-15 deep breaths every 1-2 hours.  Do not spend more than a few minutes on the toilet if you cannot empty your bowel; instead re-visit the toilet at a later time.    Thank you for choosing me and [facility].  Malcolm T. Stark, Jr., MD., FACG  

## 2019-10-15 NOTE — Progress Notes (Signed)
PROCEDURE NOTE: The patient presents with symptomatic bleeding grade III hemorrhoids, requesting rubber band ligation of their hemorrhoidal disease.  All risks, benefits and alternative forms of therapy were described and informed consent was obtained.  Risks, benefits and alternatives to Plavix hold or continuing reviewed and he consents to proceed with banding on Plavix. RA banded on 09/10/2019 on Plavix. Prolapse is unchanged and bleeding has improved.   The anorectum was pre-medicated during digital exam with 0.125% NTG and 5% lidocaine.   The decision was made to band the RP internal hemorrhoid, and the [place] was used to perform band ligation without complication.  Digital anorectal examination was then performed to assure proper positioning of the band and to adjust the banded tissue as required. No adjustment was needed.  No complications were encountered and the patient tolerated the procedure well.  Dietary and behavioral recommendations were given and along with follow-up instructions.   Return for possible additional hemorrhoid banding in 2 - 3 weeks as required.  High fiber diet, Benefiber daily and at least 8 glasses of water daily. The patient was discharged home without pain or other post procedure problems.

## 2019-11-06 ENCOUNTER — Ambulatory Visit (INDEPENDENT_AMBULATORY_CARE_PROVIDER_SITE_OTHER): Payer: 59 | Admitting: Gastroenterology

## 2019-11-06 ENCOUNTER — Encounter: Payer: Self-pay | Admitting: Gastroenterology

## 2019-11-06 VITALS — BP 130/72 | HR 80 | Temp 97.8°F | Ht 70.0 in | Wt 147.4 lb

## 2019-11-06 DIAGNOSIS — K648 Other hemorrhoids: Secondary | ICD-10-CM | POA: Diagnosis not present

## 2019-11-06 DIAGNOSIS — K2270 Barrett's esophagus without dysplasia: Secondary | ICD-10-CM | POA: Diagnosis not present

## 2019-11-06 DIAGNOSIS — K642 Third degree hemorrhoids: Secondary | ICD-10-CM

## 2019-11-06 NOTE — Progress Notes (Signed)
PROCEDURE NOTE: The patient presents with symptomatic bleeding grade III hemorrhoids, requesting rubber band ligation of their hemorrhoidal disease.  All risks, benefits and alternative forms of therapy were described and informed consent was obtained.  Risks, benefits and alternatives to Plavix hold or continuing reviewed and he consents to proceed with banding on Plavix. RA banded on 09/10/2019 on Plavix. RP banded on 10/15/2019 on Plavix. Prolapse has slightly improved however is still occurring 4 days/week . Bleeding has substantially improved however it persists.    Physical Exam: General: Well developed, well nourished, no acute distress Head: Normocephalic and atraumatic Eyes:  sclerae anicteric, EOMI Ears: Normal auditory acuity Neurological: Alert oriented x 4, grossly nonfocal Psychological:  Alert and cooperative. Normal mood and affect   The anorectum was pre-medicated during digital exam with 0.125% NTG and 5% lidocaine.   The decision was made to band the LL internal hemorrhoid, and the [place] was used to perform band ligation without complication.  Digital anorectal examination was then performed to assure proper positioning of the band and to adjust the banded tissue as required. No adjustment was needed.  No complications were encountered and the patient tolerated the procedure well.  Dietary and behavioral recommendations were given and along with follow-up instructions.   High fiber diet, Benefiber daily and at least 8 glasses of water daily. The patient was discharged home without pain or other post procedure problems.  REV in 1 month consider anoscopy and additional banding as indicated.   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 GERD. Esophageal stricture. Dysphagia resolved post dilation in January. Reviewed Barrett's management, dysplasia follow up. Continue pantoprazole 40 mg daily long term.  Follow antireflux  measures long-term repeat EGD with Barrett's biopsies in July 2021.

## 2019-11-06 NOTE — Patient Instructions (Signed)

## 2019-11-09 ENCOUNTER — Other Ambulatory Visit: Payer: Self-pay | Admitting: Family Medicine

## 2019-11-23 ENCOUNTER — Other Ambulatory Visit: Payer: Self-pay | Admitting: Family Medicine

## 2019-11-24 NOTE — Telephone Encounter (Signed)
Refill request for pending medication, previous patient of Dr. Zigmund Daniel last visit 06/24/2019. Okay to refill Rx please advise.

## 2019-11-24 NOTE — Telephone Encounter (Signed)
Okay to refill med for 30 days. Needs to be seen.

## 2019-11-27 NOTE — Telephone Encounter (Signed)
Dr. Ethelene Hal pending medication for you to sign and refill. Medication out of my scope to fill.

## 2019-12-11 ENCOUNTER — Ambulatory Visit (INDEPENDENT_AMBULATORY_CARE_PROVIDER_SITE_OTHER): Payer: 59 | Admitting: Gastroenterology

## 2019-12-11 ENCOUNTER — Encounter: Payer: Self-pay | Admitting: Gastroenterology

## 2019-12-11 VITALS — BP 124/76 | HR 68 | Temp 97.9°F | Ht 70.0 in | Wt 149.4 lb

## 2019-12-11 DIAGNOSIS — K642 Third degree hemorrhoids: Secondary | ICD-10-CM

## 2019-12-11 DIAGNOSIS — K648 Other hemorrhoids: Secondary | ICD-10-CM | POA: Diagnosis not present

## 2019-12-11 NOTE — Patient Instructions (Signed)

## 2019-12-11 NOTE — Progress Notes (Signed)
PROCEDURE NOTE: The patient presents with symptomatic grade 3 bleeding hemorrhoids, requesting rubber band ligation of their hemorrhoidal disease.  All risks, benefits and alternative forms of therapy were described and informed consent was obtained. Symptoms have improved following banding X 3 however frequent prolapse and bleeding persist. Risks, benefits and alternatives to a longer Plavix hold were discussed. He has been off Plavix for 3 day for an upcoming hair transplant. He consents to proceed.   The anorectum was pre-medicated during digital exam with 0.125% NTG and 5% lidocaine. In the Left Lateral Decubitus position anoscopic examination revealed grade 3 hemorrhoids in the RA position.   The decision was made to band the RA internal hemorrhoid, and the [place] was used to perform band ligation without complication.  Digital anorectal examination was then performed to assure proper positioning of the band and to adjust the banded tissue as required. No adjustment was needed.  No complications were encountered and the patient tolerated the procedure well.  Dietary and behavioral recommendations were given and along with follow-up instructions.   Return for possible additional hemorrhoid banding in 2 - 3 weeks as required if symptoms. If symptoms persist consider 1 more banding of the RA hemorrhoid and if not successful colorectal surgery referral. High fiber diet, Benefiber daily and at least 8 glasses of water daily. The patient was discharged home without pain or other post procedure problems.

## 2019-12-27 ENCOUNTER — Other Ambulatory Visit: Payer: Self-pay | Admitting: Nurse Practitioner

## 2019-12-31 ENCOUNTER — Ambulatory Visit (INDEPENDENT_AMBULATORY_CARE_PROVIDER_SITE_OTHER): Payer: 59 | Admitting: Family Medicine

## 2019-12-31 ENCOUNTER — Encounter: Payer: Self-pay | Admitting: Family Medicine

## 2019-12-31 ENCOUNTER — Other Ambulatory Visit: Payer: Self-pay

## 2019-12-31 VITALS — BP 137/98 | HR 105 | Temp 98.0°F | Ht 70.0 in | Wt 151.0 lb

## 2019-12-31 DIAGNOSIS — R7989 Other specified abnormal findings of blood chemistry: Secondary | ICD-10-CM

## 2019-12-31 DIAGNOSIS — I10 Essential (primary) hypertension: Secondary | ICD-10-CM | POA: Diagnosis not present

## 2019-12-31 DIAGNOSIS — E7800 Pure hypercholesterolemia, unspecified: Secondary | ICD-10-CM | POA: Diagnosis not present

## 2019-12-31 DIAGNOSIS — Z0000 Encounter for general adult medical examination without abnormal findings: Secondary | ICD-10-CM | POA: Diagnosis not present

## 2019-12-31 DIAGNOSIS — F321 Major depressive disorder, single episode, moderate: Secondary | ICD-10-CM

## 2019-12-31 DIAGNOSIS — Z125 Encounter for screening for malignant neoplasm of prostate: Secondary | ICD-10-CM

## 2019-12-31 LAB — CBC
HCT: 36.9 % — ABNORMAL LOW (ref 38.5–50.0)
Hemoglobin: 12.5 g/dL — ABNORMAL LOW (ref 13.2–17.1)
MCH: 36 pg — ABNORMAL HIGH (ref 27.0–33.0)
MCHC: 33.9 g/dL (ref 32.0–36.0)
MCV: 106.3 fL — ABNORMAL HIGH (ref 80.0–100.0)
MPV: 9.5 fL (ref 7.5–12.5)
Platelets: 125 10*3/uL — ABNORMAL LOW (ref 140–400)
RBC: 3.47 10*6/uL — ABNORMAL LOW (ref 4.20–5.80)
RDW: 12.4 % (ref 11.0–15.0)
WBC: 3.8 10*3/uL (ref 3.8–10.8)

## 2019-12-31 LAB — COMPLETE METABOLIC PANEL WITH GFR
AG Ratio: 1.5 (calc) (ref 1.0–2.5)
ALT: 20 U/L (ref 9–46)
AST: 34 U/L (ref 10–35)
Albumin: 3.8 g/dL (ref 3.6–5.1)
Alkaline phosphatase (APISO): 67 U/L (ref 35–144)
BUN: 22 mg/dL (ref 7–25)
CO2: 30 mmol/L (ref 20–32)
Calcium: 9.1 mg/dL (ref 8.6–10.3)
Chloride: 101 mmol/L (ref 98–110)
Creat: 0.99 mg/dL (ref 0.70–1.33)
GFR, Est African American: 96 mL/min/{1.73_m2} (ref >=60)
GFR, Est Non African American: 83 mL/min/{1.73_m2} (ref >=60)
Globulin: 2.5 g/dL (calc) (ref 1.9–3.7)
Glucose, Bld: 94 mg/dL (ref 65–99)
Potassium: 3.9 mmol/L (ref 3.5–5.3)
Sodium: 138 mmol/L (ref 135–146)
Total Bilirubin: 0.5 mg/dL (ref 0.2–1.2)
Total Protein: 6.3 g/dL (ref 6.1–8.1)

## 2019-12-31 LAB — TSH: TSH: 2.51 mIU/L (ref 0.40–4.50)

## 2019-12-31 LAB — LIPID PANEL
Cholesterol: 148 mg/dL (ref <200)
HDL: 86 mg/dL (ref >=40)
LDL Cholesterol (Calc): 41 mg/dL (calc)
Non-HDL Cholesterol (Calc): 62 mg/dL (calc) (ref <130)
Total CHOL/HDL Ratio: 1.7 (calc) (ref <5.0)
Triglycerides: 126 mg/dL (ref <150)

## 2019-12-31 LAB — TESTOSTERONE: Testosterone: 95 ng/dL — ABNORMAL LOW (ref 250–827)

## 2019-12-31 LAB — PSA: PSA: 0.7 ng/mL (ref <=4.0)

## 2019-12-31 MED ORDER — ATORVASTATIN CALCIUM 80 MG PO TABS: 80.0000 mg | ORAL_TABLET | Freq: Every day | ORAL | 1 refills | Status: DC

## 2019-12-31 MED ORDER — VENLAFAXINE HCL ER 75 MG PO CP24: 75.0000 mg | ORAL_CAPSULE | Freq: Every day | ORAL | 1 refills | Status: DC

## 2019-12-31 NOTE — Assessment & Plan Note (Signed)
Update testosterone levels.

## 2019-12-31 NOTE — Progress Notes (Signed)
Jeremiah Short - 59 y.o. male MRN [id number]  Date of birth: 11/08/1960  Subjective Chief Complaint  Patient presents with  . Annual Exam    HPI Jeremiah Short is a 59 y.o. male with history of HTN, CAD, nicotine dependence and depression here today for annual exam.  He reports that overall he is doing pretty well.  Doesn't feel that depression is adequately controlled.  He is still having quite of anhedonia and fatigue.  It appears that my previous clinic recently refilled both effexor and fluoxetine for him, he is not sure what he is taking currently.  When I last saw him he reported he had been doing well with effexor.    He had colonoscopy/endoscopy in 10/2018. He had esophageal dilatation at that time.    He continues to see his cardiologist annually.   Unfortunately he continues to smoke.  Previous efforts with bupropion and chantix have been unscuccessful.    He has cut back on EtOH use.   He does not exercise regularly.    Review of Systems  Constitutional: Negative for chills, fever, malaise/fatigue and weight loss.  HENT: Negative for congestion, ear pain and sore throat.   Eyes: Negative for blurred vision, double vision and pain.  Respiratory: Negative for cough and shortness of breath.   Cardiovascular: Negative for chest pain and palpitations.  Gastrointestinal: Negative for abdominal pain, blood in stool, constipation, heartburn and nausea.  Genitourinary: Negative for dysuria and urgency.  Musculoskeletal: Negative for joint pain and myalgias.  Neurological: Negative for dizziness and headaches.  Endo/Heme/Allergies: Does not bruise/bleed easily.  Psychiatric/Behavioral: Negative for depression. The patient is not nervous/anxious and does not have insomnia.      Allergies  Allergen Reactions  . Morphine And Related Other (See Comments)    dont remember the symptoms    Past Medical History:  Diagnosis Date  . Anxiety   . Cardiomyopathy in other diseases  classified elsewhere   . Coronary artery disease   . Depression   . Diverticulosis   . Esophageal stricture   . GERD (gastroesophageal reflux disease)   . History of ETOH abuse   . Hypertension   . Myocardial infarction ([place]) 2009  . Pure hypercholesterolemia   . Smoker   . Stented coronary artery   . Tubular adenoma of colon 2012  . Wears glasses     Past Surgical History:  Procedure Laterality Date  . APPENDECTOMY  2011   during colectomy  . CARDIAC CATHETERIZATION  2009   placed 2 stents  . CARDIAC CATHETERIZATION N/A 09/27/2016   Procedure: Left Heart Cath and Coronary Angiography;  Surgeon: [name], MD;  Location: [facility];  Service: Cardiovascular;  Laterality: N/A;  . CARDIAC CATHETERIZATION N/A 09/27/2016   Procedure: Coronary Stent Intervention;  Surgeon: [name], MD;  Location: [facility];  Service: Cardiovascular;  Laterality: N/A;  . CORONARY STENT PLACEMENT    . HERNIA REPAIR    . INGUINAL HERNIA REPAIR Left 09/16/2013   Procedure: HERNIA REPAIR INGUINAL ADULT;  Surgeon: [name], MD;  Location: [facility];  Service: General;  Laterality: Left;  . MOUTH SURGERY    . PARTIAL COLECTOMY  2011   sigmoid  . TONSILLECTOMY    . WISDOM TOOTH EXTRACTION      Social History   Socioeconomic History  . Marital status: Divorced    Spouse name: Not on file  . Number of children: 2  . Years of education:  Not on file  . Highest education level: Not on file  Occupational History  . Occupation: Glass blower/designer  Tobacco Use  . Smoking status: Current Every Day Smoker    Packs/day: 0.75    Years: 40.00    Pack years: 30.00    Types: E-cigarettes, Cigarettes  . Smokeless tobacco: Never Used  . Tobacco comment: Pt given handout to quit smoking  Substance and Sexual Activity  . Alcohol use: Yes    Alcohol/week: 90.0 standard drinks    Types: 90 Shots of liquor per week    Comment: 1/5 vodka every 3 days (750 ml)  . Drug  use: No  . Sexual activity: Not on file  Other Topics Concern  . Not on file  Social History Narrative  . Not on file   Social Determinants of Health   Financial Resource Strain:   . Difficulty of Paying Living Expenses:   Food Insecurity:   . Worried About Charity fundraiser in the Last Year:   . Arboriculturist in the Last Year:   Transportation Needs:   . Film/video editor (Medical):   [name] Lack of Transportation (Non-Medical):   Physical Activity:   . Days of Exercise per Week:   . Minutes of Exercise per Session:   Stress:   . Feeling of Stress :   Social Connections:   . Frequency of Communication with Friends and Family:   . Frequency of Social Gatherings with Friends and Family:   . Attends Religious Services:   . Active Member of Clubs or Organizations:   . Attends Archivist Meetings:   [name] Marital Status:     Family History  Problem Relation Age of Onset  . Lung cancer Mother   . Heart disease Father        has pacemaker  . Breast cancer Sister   . Colon cancer Neg Hx   . Colon polyps Neg Hx   . Esophageal cancer Neg Hx   . Kidney disease Neg Hx   . Diabetes Neg Hx   . Stomach cancer Neg Hx   . Rectal cancer Neg Hx     Health Maintenance  Topic Date Due  . COLONOSCOPY  09/24/2021  . TETANUS/TDAP  10/23/2028  . INFLUENZA VACCINE  Completed  . Hepatitis C Screening  Completed  . HIV Screening  Discontinued     ----------------------------------------------------------------------------------------------------------------------------------------------------------------------------------------------------------------- Physical Exam BP (!) 137/98   Pulse (!) 105   Temp 98 F (36.7 C) (Oral)   Ht 5\' 10"  (1.778 m)   Wt 151 lb (68.5 kg)   BMI 21.67 kg/m   Physical Exam Constitutional:      General: He is not in acute distress. HENT:     Head: Normocephalic and atraumatic.     Right Ear: Tympanic membrane and external ear normal.      Left Ear: Tympanic membrane and external ear normal.  Eyes:     General: No scleral icterus. Neck:     Thyroid: No thyromegaly.  Cardiovascular:     Rate and Rhythm: Normal rate and regular rhythm.     Heart sounds: Normal heart sounds.  Pulmonary:     Effort: Pulmonary effort is normal.     Breath sounds: Normal breath sounds.  Abdominal:     General: Bowel sounds are normal. There is no distension.     Palpations: Abdomen is soft.     Tenderness: There is no abdominal tenderness. There is no guarding.  Musculoskeletal:     Cervical back: Normal range of motion.  Lymphadenopathy:     Cervical: No cervical adenopathy.  Skin:    General: Skin is warm and dry.     Findings: No rash.  Neurological:     Mental Status: He is alert and oriented to person, place, and time.     Cranial Nerves: No cranial nerve deficit.     Motor: No abnormal muscle tone.  Psychiatric:        Mood and Affect: Mood normal.        Behavior: Behavior normal.     ------------------------------------------------------------------------------------------------------------------------------------------------------------------------------------------------------------------- Assessment and Plan  MDD (major depressive disorder), single episode, moderate ([place]) I reviewed with him that he should be taking effexor 75mg  daily.  If not taking he should restart this.  If he is already taking I am going to have him increase to 150mg  daily.  Discussed with him that he should not be taking fluoxetine at this time.   Low testosterone Update testosterone levels.   Well adult exam Well adult Orders Placed This Encounter  Procedures  . Testosterone  . COMPLETE METABOLIC PANEL WITH GFR  . CBC  . Lipid Profile  . TSH  . PSA  Screening: PSA Immunizations: UTD, COVID vaccine discussed.  Anticipatory guidance/Risk factor reduction:  Counseled on smoking cessation and continuing to cut back on EtOH use.  Follow  healthy diet with regular exercise.  Additional recommendations per AVS.    Meds ordered this encounter  Medications  . atorvastatin (LIPITOR) 80 MG tablet    Sig: Take 1 tablet (80 mg total) by mouth daily at 6 PM.    Dispense:  90 tablet    Refill:  1  . venlafaxine XR (EFFEXOR-XR) 75 MG 24 hr capsule    Sig: Take 1 capsule (75 mg total) by mouth daily with breakfast.    Dispense:  90 capsule    Refill:  1    Return in about 8 weeks (around 02/25/2020) for Depression.    This visit occurred during the SARS-CoV-2 public health emergency.  Safety protocols were in place, including screening questions prior to the visit, additional usage of staff PPE, and extensive cleaning of exam room while observing appropriate contact time as indicated for disinfecting solutions.

## 2019-12-31 NOTE — Assessment & Plan Note (Signed)
Well adult Orders Placed This Encounter  Procedures  . Testosterone  . COMPLETE METABOLIC PANEL WITH GFR  . CBC  . Lipid Profile  . TSH  . PSA  Screening: PSA Immunizations: UTD, COVID vaccine discussed.  Anticipatory guidance/Risk factor reduction:  Counseled on smoking cessation and continuing to cut back on EtOH use.  Follow healthy diet with regular exercise.  Additional recommendations per AVS.

## 2019-12-31 NOTE — Assessment & Plan Note (Signed)
I reviewed with him that he should be taking effexor 75mg  daily.  If not taking he should restart this.  If he is already taking I am going to have him increase to 150mg  daily.  Discussed with him that he should not be taking fluoxetine at this time.

## 2019-12-31 NOTE — Patient Instructions (Addendum)
You should be taking effexor (venlafaxine) 39m daily.  Do not take fluoxetine.  If you are already taking venlafaxine please increase to 2 capsules per day and see me again in about 6-8 weeks.     Preventive Care Jeremiah Short, Jeremiah Short Preventive care refers to lifestyle choices and visits with your health care provider that can promote health and wellness. This includes:  A yearly physical exam. This is also called an annual well check.  Regular dental and eye exams.  Immunizations.  Screening for certain conditions.  Healthy lifestyle choices, such as eating a healthy diet, getting regular exercise, not using drugs or products that contain nicotine and tobacco, and limiting alcohol use. What can I expect for my preventive care visit? Physical exam Your health care provider will check:  Height and weight. These may be used to calculate body mass index (BMI), which is a measurement that tells if you are at a healthy weight.  Heart rate and blood pressure.  Your skin for abnormal spots. Counseling Your health care provider may ask you questions about:  Alcohol, tobacco, and drug use.  Emotional well-being.  Home and relationship well-being.  Sexual activity.  Eating habits.  Work and work eStatistician What immunizations do I need?  Influenza (flu) vaccine  This is recommended every year. Tetanus, diphtheria, and pertussis (Tdap) vaccine  You may need a Td booster every 10 years. Varicella (chickenpox) vaccine  You may need this vaccine if you have not already been vaccinated. Zoster (shingles) vaccine  You may need this after age [address]478 Measles, mumps, and rubella (MMR) vaccine  You may need at least one dose of MMR if you were born in 1957 or later. You may also need a second dose. Pneumococcal conjugate (PCV13) vaccine  You may need this if you have certain conditions and were not previously vaccinated. Pneumococcal polysaccharide (PPSV23) vaccine  You may  need one or two doses if you smoke cigarettes or if you have certain conditions. Meningococcal conjugate (MenACWY) vaccine  You may need this if you have certain conditions. Hepatitis A vaccine  You may need this if you have certain conditions or if you travel or work in places where you may be exposed to hepatitis A. Hepatitis B vaccine  You may need this if you have certain conditions or if you travel or work in places where you may be exposed to hepatitis B. Haemophilus influenzae type b (Hib) vaccine  You may need this if you have certain risk factors. Human papillomavirus (HPV) vaccine  If recommended by your health care provider, you may need three doses over 6 months. You may receive vaccines as individual doses or as more than one vaccine together in one shot (combination vaccines). Talk with your health care provider about the risks and benefits of combination vaccines. What tests do I need? Blood tests  Lipid and cholesterol levels. These may be checked every 5 years, or more frequently if you are over 550years Short.  Hepatitis C test.  Hepatitis B test. Screening  Lung cancer screening. You may have this screening every year starting at age 630[address]if you have a 30-pack-year history of smoking and currently smoke or have quit within the past 15 years.  Prostate cancer screening. Recommendations will vary depending on your family history and other risks.  Colorectal cancer screening. All adults should have this screening starting at age 630[address]and continuing until age 60 Your health care provider may recommend screening at age 6117if  you are at increased risk. You will have tests every 1-10 years, depending on your results and the type of screening test.  Diabetes screening. This is done by checking your blood sugar (glucose) after you have not eaten for a while (fasting). You may have this done every 1-3 years.  Sexually transmitted disease (STD) testing. Follow these  instructions at home: Eating and drinking  Eat a diet that includes fresh fruits and vegetables, whole grains, lean protein, and low-fat dairy products.  Take vitamin and mineral supplements as recommended by your health care provider.  Do not drink alcohol if your health care provider tells you not to drink.  If you drink alcohol: ? Limit how much you have to 0-2 drinks a day. ? Be aware of how much alcohol is in your drink. In the [place] one drink equals one 12 oz bottle of beer (355 mL), one 5 oz glass of wine (148 mL), or one 1 oz glass of hard liquor (44 mL). Lifestyle  Take daily care of your teeth and gums.  Stay active. Exercise for at least 30 minutes on 5 or more days each week.  Do not use any products that contain nicotine or tobacco, such as cigarettes, e-cigarettes, and chewing tobacco. If you need help quitting, ask your health care provider.  If you are sexually active, practice safe sex. Use a condom or other form of protection to prevent STIs (sexually transmitted infections).  Talk with your health care provider about taking a low-dose aspirin every day starting at age 62. What's next?  Go to your health care provider once a year for a well check visit.  Ask your health care provider how often you should have your eyes and teeth checked.  Stay up to date on all vaccines. This information is not intended to replace advice given to you by your health care provider. Make sure you discuss any questions you have with your health care provider. Document Revised: 09/12/2018 Document Reviewed: 09/12/2018 Elsevier Patient Education  2020 [employer].

## 2020-01-01 ENCOUNTER — Other Ambulatory Visit: Payer: Self-pay | Admitting: Cardiology

## 2020-01-06 ENCOUNTER — Other Ambulatory Visit: Payer: Self-pay

## 2020-01-06 ENCOUNTER — Ambulatory Visit (INDEPENDENT_AMBULATORY_CARE_PROVIDER_SITE_OTHER): Payer: 59 | Admitting: Physician Assistant

## 2020-01-06 VITALS — Ht 70.0 in | Wt 145.0 lb

## 2020-01-06 DIAGNOSIS — E291 Testicular hypofunction: Secondary | ICD-10-CM

## 2020-01-06 NOTE — Progress Notes (Signed)
Patient presents to clinic for teststerone teaching. I gave the patient instructions on how to give the injection and he verbalized understanding.  He did not have any questions.

## 2020-01-06 NOTE — Progress Notes (Signed)
Patient ID: Jeremiah Short, male   DOB: September 04, 1961, 58 y.o.   MRN: [id number] Agree with above plan.

## 2020-01-06 NOTE — Patient Instructions (Addendum)
Take over the counter 1000 mg B12 vitamin per Dr. Sheppard Coil.   Instructions for testosterone given.

## 2020-01-07 ENCOUNTER — Other Ambulatory Visit: Payer: Self-pay | Admitting: Family Medicine

## 2020-01-07 DIAGNOSIS — R7989 Other specified abnormal findings of blood chemistry: Secondary | ICD-10-CM

## 2020-01-07 MED ORDER — ALCOHOL PREP PADS: MEDICATED_PAD | 0 refills | Status: DC

## 2020-01-07 MED ORDER — BD SYRINGE LUER-LOK 3 ML MISC: 1 refills | Status: DC

## 2020-01-07 MED ORDER — "NEEDLE (DISP) 18G X 1-1/2"" MISC": 1 refills | Status: DC

## 2020-01-07 MED ORDER — TESTOSTERONE CYPIONATE 200 MG/ML IM SOLN: 200.0000 mg | INTRAMUSCULAR | 0 refills | Status: DC

## 2020-01-07 MED ORDER — "BD ECLIPSE NEEDLE 22G X 1-1/2"" MISC": 1 refills | Status: DC

## 2020-01-07 NOTE — Addendum Note (Signed)
Addended by: Towana Badger on: 01/07/2020 02:16 PM   Modules accepted: Orders

## 2020-01-07 NOTE — Telephone Encounter (Signed)
PT requested testosterone to be sent into the pharmacy. No changes in pharmacy location. Was seen on 4/6 for testosterone injection learning.

## 2020-01-07 NOTE — Addendum Note (Signed)
Addended by: Fonnie Mu on: 01/07/2020 02:19 PM   Modules accepted: Orders

## 2020-01-07 NOTE — Telephone Encounter (Signed)
Please sign pended orders for testosterone injections and supplies per Dr. Zigmund Daniel result notes.  Charyl Bigger, CMA

## 2020-01-07 NOTE — Addendum Note (Signed)
Addended by: Fonnie Mu on: 01/07/2020 02:15 PM   Modules accepted: Orders

## 2020-01-12 ENCOUNTER — Telehealth: Payer: Self-pay | Admitting: Family Medicine

## 2020-01-12 NOTE — Telephone Encounter (Signed)
Received fax for PA on Testosterone sent through cover my meds waiting on determination. - CF 

## 2020-02-22 ENCOUNTER — Other Ambulatory Visit: Payer: Self-pay | Admitting: Cardiology

## 2020-02-25 ENCOUNTER — Encounter: Payer: Self-pay | Admitting: Family Medicine

## 2020-02-25 ENCOUNTER — Ambulatory Visit (INDEPENDENT_AMBULATORY_CARE_PROVIDER_SITE_OTHER): Payer: 59 | Admitting: Family Medicine

## 2020-02-25 VITALS — BP 138/98 | HR 113 | Ht 70.08 in | Wt 151.2 lb

## 2020-02-25 DIAGNOSIS — I2510 Atherosclerotic heart disease of native coronary artery without angina pectoris: Secondary | ICD-10-CM | POA: Diagnosis not present

## 2020-02-25 DIAGNOSIS — R7989 Other specified abnormal findings of blood chemistry: Secondary | ICD-10-CM

## 2020-02-25 DIAGNOSIS — I10 Essential (primary) hypertension: Secondary | ICD-10-CM

## 2020-02-25 DIAGNOSIS — E7800 Pure hypercholesterolemia, unspecified: Secondary | ICD-10-CM | POA: Diagnosis not present

## 2020-02-25 DIAGNOSIS — F321 Major depressive disorder, single episode, moderate: Secondary | ICD-10-CM

## 2020-02-25 MED ORDER — LOSARTAN POTASSIUM 25 MG PO TABS: 25.0000 mg | ORAL_TABLET | Freq: Every day | ORAL | 2 refills | Status: DC

## 2020-02-25 NOTE — Patient Instructions (Addendum)
Great to see you today! Decrease losartan to 25mg .  Stop in to have testosterone checked 1 week after an injection.  I will see you again in about 3 months.

## 2020-02-25 NOTE — Assessment & Plan Note (Signed)
Depression improved with effexor, continue at current dose.

## 2020-02-25 NOTE — Assessment & Plan Note (Signed)
Followed by cardiology.  Denies anginal symptoms.  He continues on dual anti-platelet and statin.  Discussed need to quit smoking.

## 2020-02-25 NOTE — Progress Notes (Signed)
Jeremiah Short - 59 y.o. male MRN [id number]  Date of birth: [birth year]  Subjective Chief Complaint  Patient presents with  . Follow-up    HPI Cruize Shaulis is a 59 y.o. male with history of CAD, nicotine dependence depression, HTN, HLD and low testosterone here today for follow up.  He reports that overall he is doing well.  He has no new   -Depression:  Current management with Effexor.  He reports that symptoms are much better controlled.  Denies side effects with this.   -CAD/HTN:  Prior STEMI.  Followed by cardiology and continues on DAPT. His BP is managed with imdur, losartan and coreg.  Reports that he stopped his losartan because he was having some dizziness after taking.  BP is a bit elevated today. He is compliant with atorvastatin without side effects.   -Low testosterone:  Started on testosterone a few months ago.  Reports that he has noticed a difference since starting this.  Not a dramatic difference but does have more energy.   Depression screen [facility] 2/9 02/25/2020 12/31/2019 02/26/2019  Decreased Interest 3 3 3   Down, Depressed, Hopeless 2 2 2   PHQ - 2 Score 5 5 5   Altered sleeping 0 1 1  Tired, decreased energy 1 3 3   Change in appetite 0 0 0  Feeling bad or failure about yourself  2 2 3   Trouble concentrating 0 2 2  Moving slowly or fidgety/restless 0 0 0  Suicidal thoughts 0 0 0  PHQ-9 Score 8 13 14   Difficult doing work/chores - Somewhat difficult Extremely dIfficult   GAD 7 : Generalized Anxiety Score 02/25/2020 12/31/2019 02/26/2019 12/17/2018  Nervous, Anxious, on Edge 1 1 1 1   Control/stop worrying 0 0 2 1  Worry too much - different things 0 0 0 1  Trouble relaxing 0 1 1 2   Restless 0 0 0 1  Easily annoyed or irritable 1 1 3 2   Afraid - awful might happen 0 0 0 0  Total GAD 7 Score 2 3 7 8   Anxiety Difficulty Very difficult Somewhat difficult - -     ROS:  A comprehensive ROS was completed and negative except as noted per HPI      Allergies  Allergen  Reactions  . Morphine And Related Other (See Comments)    dont remember the symptoms    Past Medical History:  Diagnosis Date  . Anxiety   . Cardiomyopathy in other diseases classified elsewhere   . Coronary artery disease   . Depression   . Diverticulosis   . Esophageal stricture   . GERD (gastroesophageal reflux disease)   . History of ETOH abuse   . Hypertension   . Myocardial infarction ([place]) 2009  . Pure hypercholesterolemia   . Smoker   . Stented coronary artery   . Tubular adenoma of colon 2012  . Wears glasses     Past Surgical History:  Procedure Laterality Date  . APPENDECTOMY  2011   during colectomy  . CARDIAC CATHETERIZATION  2009   placed 2 stents  . CARDIAC CATHETERIZATION N/A 09/27/2016   Procedure: Left Heart Cath and Coronary Angiography;  Surgeon: [name], MD;  Location: [facility];  Service: Cardiovascular;  Laterality: N/A;  . CARDIAC CATHETERIZATION N/A 09/27/2016   Procedure: Coronary Stent Intervention;  Surgeon: [name], MD;  Location: [facility];  Service: Cardiovascular;  Laterality: N/A;  . CORONARY STENT PLACEMENT    . HERNIA REPAIR    .  INGUINAL HERNIA REPAIR Left 09/16/2013   Procedure: HERNIA REPAIR INGUINAL ADULT;  Surgeon: [name], MD;  Location: [facility];  Service: General;  Laterality: Left;  . MOUTH SURGERY    . PARTIAL COLECTOMY  2011   sigmoid  . TONSILLECTOMY    . WISDOM TOOTH EXTRACTION      Social History   Socioeconomic History  . Marital status: Divorced    Spouse name: Not on file  . Number of children: 2  . Years of education: Not on file  . Highest education level: Not on file  Occupational History  . Occupation: Glass blower/designer  Tobacco Use  . Smoking status: Current Every Day Smoker    Packs/day: 0.75    Years: 40.00    Pack years: 30.00    Types: E-cigarettes, Cigarettes  . Smokeless tobacco: Never Used  . Tobacco comment: Pt given handout to quit smoking   Substance and Sexual Activity  . Alcohol use: Yes    Alcohol/week: 90.0 standard drinks    Types: 90 Shots of liquor per week    Comment: 1/5 vodka every 3 days (750 ml)  . Drug use: No  . Sexual activity: Not on file  Other Topics Concern  . Not on file  Social History Narrative  . Not on file   Social Determinants of Health   Financial Resource Strain:   . Difficulty of Paying Living Expenses:   Food Insecurity:   . Worried About Charity fundraiser in the Last Year:   . Arboriculturist in the Last Year:   Transportation Needs:   . Film/video editor (Medical):   [name] Lack of Transportation (Non-Medical):   Physical Activity:   . Days of Exercise per Week:   . Minutes of Exercise per Session:   Stress:   . Feeling of Stress :   Social Connections:   . Frequency of Communication with Friends and Family:   . Frequency of Social Gatherings with Friends and Family:   . Attends Religious Services:   . Active Member of Clubs or Organizations:   . Attends Archivist Meetings:   [name] Marital Status:     Family History  Problem Relation Age of Onset  . Lung cancer Mother   . Heart disease Father        has pacemaker  . Breast cancer Sister   . Colon cancer Neg Hx   . Colon polyps Neg Hx   . Esophageal cancer Neg Hx   . Kidney disease Neg Hx   . Diabetes Neg Hx   . Stomach cancer Neg Hx   . Rectal cancer Neg Hx     Health Maintenance  Topic Date Due  . INFLUENZA VACCINE  05/02/2020  . COLONOSCOPY  09/24/2021  . TETANUS/TDAP  10/23/2028  . COVID-19 Vaccine  Completed  . Hepatitis C Screening  Completed  . HIV Screening  Discontinued     ----------------------------------------------------------------------------------------------------------------------------------------------------------------------------------------------------------------- Physical Exam BP (!) 138/98   Pulse (!) 113   Ht 5' 10.08" (1.78 m)   Wt 151 lb 3.2 oz (68.6 kg)   BMI  21.65 kg/m   Physical Exam Constitutional:      Appearance: Normal appearance.  HENT:     Head: Normocephalic and atraumatic.  Eyes:     General: No scleral icterus. Cardiovascular:     Rate and Rhythm: Normal rate and regular rhythm.  Pulmonary:     Effort: Pulmonary effort is normal.  Breath sounds: Normal breath sounds.  Musculoskeletal:     Cervical back: Neck supple.  Neurological:     General: No focal deficit present.     Mental Status: He is alert.  Psychiatric:        Mood and Affect: Mood normal.        Behavior: Behavior normal.     ------------------------------------------------------------------------------------------------------------------------------------------------------------------------------------------------------------------- Assessment and Plan  Hypertension Blood pressure is not at goal at for age and co-morbidities.  I recommend he continue imdur and coreg.  Restart losartan at 25mg .  In addition they were instructed to follow a low sodium diet with regular exercise to help to maintain adequate control of blood pressure.    Coronary artery disease Followed by cardiology.  Denies anginal symptoms.  He continues on dual anti-platelet and statin.  Discussed need to quit smoking.   Pure hypercholesterolemia Lab Results  Component Value Date   LDLCALC 41 12/31/2019  Doing well with atorvastatin, continue.   MDD (major depressive disorder), single episode, moderate ([place]) Depression improved with effexor, continue at current dose.   Low testosterone Some improvement with testosterone.   Will check labs 1 week after next injection.   Update CBC.     Meds ordered this encounter  Medications  . losartan (COZAAR) 25 MG tablet    Sig: Take 1 tablet (25 mg total) by mouth daily.    Dispense:  90 tablet    Refill:  2    Return in about 3 months (around 05/27/2020) for HTN/Depression.    This visit occurred during the SARS-CoV-2 public  health emergency.  Safety protocols were in place, including screening questions prior to the visit, additional usage of staff PPE, and extensive cleaning of exam room while observing appropriate contact time as indicated for disinfecting solutions.

## 2020-02-25 NOTE — Assessment & Plan Note (Signed)
Lab Results  Component Value Date   LDLCALC 41 12/31/2019  Doing well with atorvastatin, continue.

## 2020-02-25 NOTE — Assessment & Plan Note (Signed)
Blood pressure is not at goal at for age and co-morbidities.  I recommend he continue imdur and coreg.  Restart losartan at 25mg .  In addition they were instructed to follow a low sodium diet with regular exercise to help to maintain adequate control of blood pressure.

## 2020-02-25 NOTE — Assessment & Plan Note (Signed)
Some improvement with testosterone.   Will check labs 1 week after next injection.   Update CBC.

## 2020-02-26 NOTE — Telephone Encounter (Signed)
Checked on PA through cover my meds and they approved testosterone.   Valid 01/09/20 - 07/10/20 - CF

## 2020-03-04 ENCOUNTER — Encounter: Payer: Self-pay | Admitting: Gastroenterology

## 2020-03-04 ENCOUNTER — Telehealth: Payer: Self-pay

## 2020-03-04 NOTE — Telephone Encounter (Signed)
Patient's sister, Jerrye Beavers, called with some concerns about her brother "under-reporting" with his health. She states that he has some unexplained weight loss, fatigues, smoking problem, depression, and other concerns. She wanted to discuss these things with Dr Zigmund Daniel.   I advised Jerrye Beavers that although she is on DPR, if she wants to discuss these concerns with Dr Zigmund Daniel, patient needs to call, schedule appointment, and she can come with patient to appointment if he is agreeable to it.   Jerrye Beavers expressed understanding and stated she will speak with her brother and have him call to set up appointment for them to come in and discuss concerns.   FYI to PCP

## 2020-03-10 ENCOUNTER — Encounter: Payer: Self-pay | Admitting: Family Medicine

## 2020-03-10 ENCOUNTER — Other Ambulatory Visit: Payer: Self-pay

## 2020-03-10 ENCOUNTER — Ambulatory Visit (INDEPENDENT_AMBULATORY_CARE_PROVIDER_SITE_OTHER): Payer: 59 | Admitting: Family Medicine

## 2020-03-10 VITALS — BP 119/83 | HR 101 | Temp 97.7°F | Ht 70.0 in | Wt 147.0 lb

## 2020-03-10 DIAGNOSIS — D539 Nutritional anemia, unspecified: Secondary | ICD-10-CM

## 2020-03-10 DIAGNOSIS — Z122 Encounter for screening for malignant neoplasm of respiratory organs: Secondary | ICD-10-CM

## 2020-03-10 DIAGNOSIS — R140 Abdominal distension (gaseous): Secondary | ICD-10-CM

## 2020-03-10 DIAGNOSIS — R630 Anorexia: Secondary | ICD-10-CM

## 2020-03-10 DIAGNOSIS — F17210 Nicotine dependence, cigarettes, uncomplicated: Secondary | ICD-10-CM

## 2020-03-10 DIAGNOSIS — R5383 Other fatigue: Secondary | ICD-10-CM | POA: Diagnosis not present

## 2020-03-10 DIAGNOSIS — Z7289 Other problems related to lifestyle: Secondary | ICD-10-CM

## 2020-03-10 DIAGNOSIS — F331 Major depressive disorder, recurrent, moderate: Secondary | ICD-10-CM

## 2020-03-10 DIAGNOSIS — Z789 Other specified health status: Secondary | ICD-10-CM

## 2020-03-10 NOTE — Assessment & Plan Note (Signed)
Mild anemia on recent labs.  MCV elevated.  Checking B12 and folate as well as thiamine given EtOH use.  Lung cancer screening ordered with low dose CT chest.  Will obtain US of abdomen as he is having some early satiety and bloating.   He feels like depression is ok right now.  He will stop back in to have testosterone levels rechecked as well.

## 2020-03-10 NOTE — Patient Instructions (Signed)
Have labs completed.  Try biotene mouthwash for dry mouth.  Xylitol is helpful as well.  You will be contacted for CT scan and ultrasound.

## 2020-03-10 NOTE — Progress Notes (Signed)
Jeremiah Short - 59 y.o. male MRN [id number]  Date of birth: 19-Oct-1960  Subjective Chief Complaint  Patient presents with  . Fatigue    HPI Jeremiah Short is a 58 y.o. male with history of HTN, HLD, depression, low testosterone, and nicotine dependence here today to discuss low energy levels.   Reports continued fatigue.  Labs in 12/2019 with low testosterone levels and started on supplementation for this. He also had some mild anemia at that time with elevated MCV.  He does admit to moderate EtOH use in the evenings.   He has noticed some improvement but not a whole lot since starting testosterone.  He has also notes decreased appetite with early satiety and epigastric bloating.  Stools have been normal.  He denies nausea.  He has noted some weight loss as well.    He denies chest pain but does have chronic cough without shortness of breath. He is a chronic smoker and has smoke .75-1 ppd for about 35 years.  He is interested in lung cancer screening.   ROS:  A comprehensive ROS was completed and negative except as noted per HPI  Allergies  Allergen Reactions  . Morphine And Related Other (See Comments)    dont remember the symptoms    Past Medical History:  Diagnosis Date  . Anxiety   . Barrett's esophagus   . Cardiomyopathy in other diseases classified elsewhere   . Coronary artery disease   . Depression   . Diverticulosis   . Esophageal stricture   . GERD (gastroesophageal reflux disease)   . History of ETOH abuse   . Hypertension   . Myocardial infarction ([place]) 2009  . Pure hypercholesterolemia   . Smoker   . Stented coronary artery   . Tubular adenoma of colon 2012  . Wears glasses     Past Surgical History:  Procedure Laterality Date  . APPENDECTOMY  2011   during colectomy  . CARDIAC CATHETERIZATION  2009   placed 2 stents  . CARDIAC CATHETERIZATION N/A 09/27/2016   Procedure: Left Heart Cath and Coronary Angiography;  Surgeon: [name], MD;  Location: [facility];  Service: Cardiovascular;  Laterality: N/A;  . CARDIAC CATHETERIZATION N/A 09/27/2016   Procedure: Coronary Stent Intervention;  Surgeon: [name], MD;  Location: [facility];  Service: Cardiovascular;  Laterality: N/A;  . CORONARY STENT PLACEMENT    . HERNIA REPAIR    . INGUINAL HERNIA REPAIR Left 09/16/2013   Procedure: HERNIA REPAIR INGUINAL ADULT;  Surgeon: [name], MD;  Location: [facility];  Service: General;  Laterality: Left;  . MOUTH SURGERY    . PARTIAL COLECTOMY  2011   sigmoid  . TONSILLECTOMY    . WISDOM TOOTH EXTRACTION      Social History   Socioeconomic History  . Marital status: Divorced    Spouse name: Not on file  . Number of children: 2  . Years of education: Not on file  . Highest education level: Not on file  Occupational History  . Occupation: Glass blower/designer  Tobacco Use  . Smoking status: Current Every Day Smoker    Packs/day: 0.75    Years: 40.00    Pack years: 30.00    Types: E-cigarettes, Cigarettes  . Smokeless tobacco: Never Used  . Tobacco comment: Pt given handout to quit smoking  Substance and Sexual Activity  . Alcohol use: Yes    Alcohol/week: 90.0 standard drinks    Types: 90 Shots  of liquor per week    Comment: 1/5 vodka every 3 days (750 ml)  . Drug use: No  . Sexual activity: Not on file  Other Topics Concern  . Not on file  Social History Narrative  . Not on file   Social Determinants of Health   Financial Resource Strain:   . Difficulty of Paying Living Expenses:   Food Insecurity:   . Worried About Charity fundraiser in the Last Year:   . Arboriculturist in the Last Year:   Transportation Needs:   . Film/video editor (Medical):   [name] Lack of Transportation (Non-Medical):   Physical Activity:   . Days of Exercise per Week:   . Minutes of Exercise per Session:   Stress:   . Feeling of Stress :   Social Connections:   . Frequency of Communication with Friends and  Family:   . Frequency of Social Gatherings with Friends and Family:   . Attends Religious Services:   . Active Member of Clubs or Organizations:   . Attends Archivist Meetings:   [name] Marital Status:     Family History  Problem Relation Age of Onset  . Lung cancer Mother   . Heart disease Father        has pacemaker  . Breast cancer Sister   . Colon cancer Neg Hx   . Colon polyps Neg Hx   . Esophageal cancer Neg Hx   . Kidney disease Neg Hx   . Diabetes Neg Hx   . Stomach cancer Neg Hx   . Rectal cancer Neg Hx     Health Maintenance  Topic Date Due  . INFLUENZA VACCINE  05/02/2020  . COLONOSCOPY  09/24/2021  . TETANUS/TDAP  10/23/2028  . COVID-19 Vaccine  Completed  . Hepatitis C Screening  Completed  . HIV Screening  Discontinued     ----------------------------------------------------------------------------------------------------------------------------------------------------------------------------------------------------------------- Physical Exam BP 119/83   Pulse (!) 101   Temp 97.7 F (36.5 C) (Oral)   Ht 5\' 10"  (1.778 m)   Wt 147 lb (66.7 kg)   BMI 21.09 kg/m   Physical Exam Constitutional:      Appearance: Normal appearance.  HENT:     Head: Normocephalic and atraumatic.  Eyes:     General: No scleral icterus. Cardiovascular:     Rate and Rhythm: Normal rate and regular rhythm.  Pulmonary:     Effort: Pulmonary effort is normal.     Breath sounds: Normal breath sounds.  Abdominal:     General: Abdomen is flat. There is no distension.     Tenderness: There is no abdominal tenderness.  Musculoskeletal:     Cervical back: Neck supple.  Neurological:     General: No focal deficit present.     Mental Status: He is alert.  Psychiatric:        Mood and Affect: Mood normal.        Behavior: Behavior normal.      ------------------------------------------------------------------------------------------------------------------------------------------------------------------------------------------------------------------- Assessment and Plan  Fatigue Mild anemia on recent labs.  MCV elevated.  Checking B12 and folate as well as thiamine given EtOH use.  Lung cancer screening ordered with low dose CT chest.  Will obtain US of abdomen as he is having some early satiety and bloating.   He feels like depression is ok right now.  He will stop back in to have testosterone levels rechecked as well.    No orders of the defined types were placed in  this encounter.   No follow-ups on file.    This visit occurred during the SARS-CoV-2 public health emergency.  Safety protocols were in place, including screening questions prior to the visit, additional usage of staff PPE, and extensive cleaning of exam room while observing appropriate contact time as indicated for disinfecting solutions.

## 2020-03-12 ENCOUNTER — Other Ambulatory Visit: Payer: 59

## 2020-03-18 ENCOUNTER — Other Ambulatory Visit: Payer: Self-pay

## 2020-03-18 ENCOUNTER — Ambulatory Visit (INDEPENDENT_AMBULATORY_CARE_PROVIDER_SITE_OTHER): Payer: 59

## 2020-03-18 ENCOUNTER — Ambulatory Visit: Payer: 59

## 2020-03-18 DIAGNOSIS — Z122 Encounter for screening for malignant neoplasm of respiratory organs: Secondary | ICD-10-CM

## 2020-03-18 DIAGNOSIS — F17210 Nicotine dependence, cigarettes, uncomplicated: Secondary | ICD-10-CM | POA: Diagnosis not present

## 2020-03-19 LAB — CBC
HCT: 38.2 % — ABNORMAL LOW (ref 38.5–50.0)
Hemoglobin: 13.1 g/dL — ABNORMAL LOW (ref 13.2–17.1)
MCH: 36 pg — ABNORMAL HIGH (ref 27.0–33.0)
MCHC: 34.3 g/dL (ref 32.0–36.0)
MCV: 104.9 fL — ABNORMAL HIGH (ref 80.0–100.0)
MPV: 9.7 fL (ref 7.5–12.5)
Platelets: 127 10*3/uL — ABNORMAL LOW (ref 140–400)
RBC: 3.64 10*6/uL — ABNORMAL LOW (ref 4.20–5.80)
RDW: 13.1 % (ref 11.0–15.0)
WBC: 5 10*3/uL (ref 3.8–10.8)

## 2020-03-19 LAB — TESTOSTERONE: Testosterone: 1021 ng/dL — ABNORMAL HIGH (ref 250–827)

## 2020-03-21 LAB — VITAMIN B1: Vitamin B1 (Thiamine): 13 nmol/L (ref 8–30)

## 2020-03-21 LAB — B12 AND FOLATE PANEL
Folate: 15.6 ng/mL
Vitamin B-12: 658 pg/mL (ref 200–1100)

## 2020-03-22 ENCOUNTER — Other Ambulatory Visit: Payer: 59

## 2020-03-24 ENCOUNTER — Other Ambulatory Visit: Payer: Self-pay | Admitting: Family Medicine

## 2020-03-24 DIAGNOSIS — K209 Esophagitis, unspecified without bleeding: Secondary | ICD-10-CM

## 2020-03-24 DIAGNOSIS — R6881 Early satiety: Secondary | ICD-10-CM

## 2020-03-30 ENCOUNTER — Other Ambulatory Visit: Payer: Self-pay | Admitting: Family Medicine

## 2020-03-31 ENCOUNTER — Other Ambulatory Visit: Payer: Self-pay | Admitting: Family Medicine

## 2020-03-31 ENCOUNTER — Other Ambulatory Visit: Payer: Self-pay

## 2020-03-31 DIAGNOSIS — R7989 Other specified abnormal findings of blood chemistry: Secondary | ICD-10-CM

## 2020-03-31 MED ORDER — VENLAFAXINE HCL ER 150 MG PO CP24: 150.0000 mg | ORAL_CAPSULE | Freq: Every day | ORAL | 1 refills | Status: DC

## 2020-03-31 MED ORDER — "BD ECLIPSE NEEDLE 22G X 1-1/2"" MISC": 1 refills | Status: DC

## 2020-03-31 MED ORDER — "NEEDLE (DISP) 18G X 1-1/2"" MISC": 1 refills | Status: DC

## 2020-03-31 MED ORDER — TESTOSTERONE CYPIONATE 200 MG/ML IM SOLN: INTRAMUSCULAR | 0 refills | Status: DC

## 2020-03-31 NOTE — Telephone Encounter (Signed)
Pt has been taking 2 tabs instead of 1. Not sure of dosage for Effexor.  Also, testosterone decrease of dosage has been changed per your note:  0.20mL every 2 weeks

## 2020-04-02 ENCOUNTER — Other Ambulatory Visit: Payer: Self-pay | Admitting: Sports Medicine

## 2020-04-02 ENCOUNTER — Other Ambulatory Visit: Payer: Self-pay | Admitting: Cardiology

## 2020-04-02 DIAGNOSIS — R7989 Other specified abnormal findings of blood chemistry: Secondary | ICD-10-CM

## 2020-04-02 NOTE — Telephone Encounter (Signed)
To PCP

## 2020-04-12 ENCOUNTER — Other Ambulatory Visit: Payer: 59

## 2020-04-12 ENCOUNTER — Ambulatory Visit (HOSPITAL_BASED_OUTPATIENT_CLINIC_OR_DEPARTMENT_OTHER): Admission: RE | Admit: 2020-04-12 | Payer: 59 | Source: Ambulatory Visit

## 2020-04-14 ENCOUNTER — Telehealth: Payer: Self-pay | Admitting: Cardiology

## 2020-04-14 MED ORDER — CLOPIDOGREL BISULFATE 75 MG PO TABS: 75.0000 mg | ORAL_TABLET | Freq: Every day | ORAL | 0 refills | Status: DC

## 2020-04-14 NOTE — Telephone Encounter (Signed)
Pt's medication was sent to pt's pharmacy as requested. Confirmation received.  °

## 2020-04-14 NOTE — Telephone Encounter (Signed)
New message   Pt c/o medication issue:  1. Name of Medication: clopidogrel (PLAVIX) 75 MG tablet  2. How are you currently taking this medication (dosage and times per day)? As written  3. Are you having a reaction (difficulty breathing--STAT)? No   4. What is your medication issue? Patient needs a new prescription sent to [id number] - [place], [place]

## 2020-04-19 ENCOUNTER — Ambulatory Visit (HOSPITAL_BASED_OUTPATIENT_CLINIC_OR_DEPARTMENT_OTHER): Admission: RE | Admit: 2020-04-19 | Payer: 59 | Source: Ambulatory Visit

## 2020-04-22 ENCOUNTER — Other Ambulatory Visit: Payer: Self-pay | Admitting: Family Medicine

## 2020-05-10 ENCOUNTER — Ambulatory Visit (INDEPENDENT_AMBULATORY_CARE_PROVIDER_SITE_OTHER): Payer: 59

## 2020-05-10 ENCOUNTER — Other Ambulatory Visit: Payer: Self-pay

## 2020-05-10 DIAGNOSIS — R630 Anorexia: Secondary | ICD-10-CM

## 2020-05-10 DIAGNOSIS — R140 Abdominal distension (gaseous): Secondary | ICD-10-CM | POA: Diagnosis not present

## 2020-05-17 ENCOUNTER — Telehealth: Payer: Self-pay

## 2020-05-17 ENCOUNTER — Encounter: Payer: Self-pay | Admitting: Gastroenterology

## 2020-05-17 ENCOUNTER — Ambulatory Visit (INDEPENDENT_AMBULATORY_CARE_PROVIDER_SITE_OTHER): Payer: 59 | Admitting: Gastroenterology

## 2020-05-17 VITALS — BP 124/90 | HR 107 | Ht 70.0 in | Wt 148.8 lb

## 2020-05-17 DIAGNOSIS — K8020 Calculus of gallbladder without cholecystitis without obstruction: Secondary | ICD-10-CM | POA: Diagnosis not present

## 2020-05-17 DIAGNOSIS — R1310 Dysphagia, unspecified: Secondary | ICD-10-CM | POA: Diagnosis not present

## 2020-05-17 DIAGNOSIS — R933 Abnormal findings on diagnostic imaging of other parts of digestive tract: Secondary | ICD-10-CM

## 2020-05-17 DIAGNOSIS — K2270 Barrett's esophagus without dysplasia: Secondary | ICD-10-CM | POA: Insufficient documentation

## 2020-05-17 NOTE — Progress Notes (Signed)
    History of Present Illness: This is a 59 year old male who relates ongoing difficulties with solid food dysphagia.  He states he did not note any relief with his EGD with dilation performed in January.  He developed intermittent mild epigastric pain, right-sided chest pain and abdominal bloating which has resolved over the past 2 weeks.  An abdominal ultrasound was performed on August 9 revealing a 2 cm gallstone and fatty infiltration of liver.  It was otherwise unremarkable.  In addition he had a chest CT performed for lung cancer screening which showed distal esophageal wall thickening characterized as mild to moderate.  Current Medications, Allergies, Past Medical History, Past Surgical History, Family History and Social History were reviewed in [employer] record.   Physical Exam: General: Well developed, well nourished, no acute distress Head: Normocephalic and atraumatic Eyes:  sclerae anicteric, EOMI Ears: Normal auditory acuity Mouth: Not examined, mask on during Covid-19 pandemic Lungs: Clear throughout to auscultation Heart: Regular rate and rhythm; no murmurs, rubs or bruits Abdomen: Soft, non tender and non distended. No masses, hepatosplenomegaly or hernias noted. Normal Bowel sounds Rectal: Not done Musculoskeletal: Symmetrical with no gross deformities  Pulses:  Normal pulses noted Extremities: No clubbing, cyanosis, edema or deformities noted Neurological: Alert oriented x 4, grossly nonfocal Psychological:  Alert and cooperative. Normal mood and affect   Assessment and Recommendations:  1.  Short segment Barrett's esophagus with biopsies in January 2021 indeterminate for dysplasia, persistent solid food dysphagia, abnormal CT of esophagus with mild to moderate distal esophageal wall thickening.  Continue pantoprazole 40 mg daily.  Follow antireflux measures.  Schedule EGD for repeat Barrett's biopsies and repeat esophageal dilation. The risks  (including bleeding, perforation, infection, missed lesions, medication reactions and possible hospitalization or surgery if complications occur), benefits, and alternatives to endoscopy with possible biopsy and possible dilation were discussed with the patient and they consent to proceed.   2. Right chest pain, epigastric pain currently symptoms are inactive.  Possible biliary symptoms.  Observe for recurrent symptoms and consider cholecystectomy if symptoms persist.  3.  Personal history of multiple adenomatous colon polyps, 2 polyps were 10 mm and 14 mm respectively.  A 3-year interval surveillance colonoscopy is recommended in December 2022.

## 2020-05-17 NOTE — Telephone Encounter (Signed)
Primary Cardiologist:Kinston [name], MD  Chart reviewed as part of pre-operative protocol coverage. Because of Jyren Dudash's past medical history and time since last visit, he/she will require a follow-up visit in order to better assess preoperative cardiovascular risk.  Pre-op covering staff: - Please schedule appointment and call patient to inform them. - Please contact requesting surgeon's office via preferred method (i.e, phone, fax) to inform them of need for appointment prior to surgery.  If applicable, this message will also be routed to pharmacy pool and/or primary cardiologist for input on holding anticoagulant/antiplatelet agent as requested below so that this information is available at time of patient's appointment.   Deberah Pelton, NP  05/17/2020, 10:58 AM

## 2020-05-17 NOTE — Telephone Encounter (Signed)
Called pt and scheduled appt at [facility] 05/31/2020 @10 :56 AM w/Lori Servando Snare, NP. Pt will arrive early for registration

## 2020-05-17 NOTE — Patient Instructions (Signed)
You have been scheduled for an endoscopy. Please follow written instructions given to you at your visit today. If you use inhalers (even only as needed), please bring them with you on the day of your procedure.  Thank you for choosing me and [facility].  Malcolm T. Stark, Jr., MD., FACG  

## 2020-05-17 NOTE — Telephone Encounter (Signed)
[facility] Pre-operative Risk Assessment     Request for surgical clearance:     Endoscopy Procedure  What type of surgery is being performed?     EGD3  When is this surgery scheduled?     06/17/20  What type of clearance is required ?   Pharmacy  Are there any medications that need to be held prior to surgery and how long? Plavix x 5 days  Practice name and name of physician performing surgery?      [facility]  What is your office phone and fax number?      Phone- [phone]  [phone]  Anesthesia type (None, local, MAC, general) ?       MAC

## 2020-05-24 NOTE — Progress Notes (Signed)
CARDIOLOGY OFFICE NOTE  Date:  05/31/2020    Jeremiah Short Date of Birth: [birth year] Medical Record #[id number]  PCP:  Luetta Nutting, DO  Cardiologist:  Marisa Cyphers  Chief Complaint  Patient presents with  . Follow-up    Seen for Dr. [name]    History of Present Illness: Jeremiah Short is a 59 y.o. male who presents today for a 11 month check. Seen for Dr. [name].   He has a known history of CAD with prior PCI to the LAD in the setting of STEMI in 2009 and again in December of 2017 with jailed DX2 that was treated with angioplasty. He is to be on indefinite DAPT. Other issues include mild LV dysfunction, ongoing smoking, HTN and HLD.   Last seen in October of 2020 and felt to be doing ok - still smoking.   Comes in today. Here alone. He is needing clearance for EGD. Had esophageal dilatation last December - has had this before. Not as much improvement with this last one. Has had progressive trouble swallowing. Has lost weight. He will occasionally use NTG for chest pain - his pattern is basically unchanged. Working 3rd shift. Still smoking. Not dizzy or lightheaded. He has no palpitations. He has had his medicines earlier this morning. Procedure is set for September 16. He remains on DAPT. Some bruising.   Past Medical History:  Diagnosis Date  . Anxiety   . Barrett's esophagus   . Cardiomyopathy in other diseases classified elsewhere   . Coronary artery disease   . Depression   . Diverticulosis   . Emphysema, unspecified ([place])   . Esophageal stricture   . GERD (gastroesophageal reflux disease)   . History of ETOH abuse   . Hypertension   . Myocardial infarction ([place]) 2009  . Pure hypercholesterolemia   . Smoker   . Stented coronary artery   . Tubular adenoma of colon 2012  . Wears glasses     Past Surgical History:  Procedure Laterality Date  . APPENDECTOMY  2011   during colectomy  . CARDIAC CATHETERIZATION  2009   placed 2 stents  . CARDIAC  CATHETERIZATION N/A 09/27/2016   Procedure: Left Heart Cath and Coronary Angiography;  Surgeon: [name], MD;  Location: [facility];  Service: Cardiovascular;  Laterality: N/A;  . CARDIAC CATHETERIZATION N/A 09/27/2016   Procedure: Coronary Stent Intervention;  Surgeon: [name], MD;  Location: [facility];  Service: Cardiovascular;  Laterality: N/A;  . CORONARY STENT PLACEMENT    . HERNIA REPAIR    . INGUINAL HERNIA REPAIR Left 09/16/2013   Procedure: HERNIA REPAIR INGUINAL ADULT;  Surgeon: [name], MD;  Location: [facility];  Service: General;  Laterality: Left;  . MOUTH SURGERY    . PARTIAL COLECTOMY  2011   sigmoid  . TONSILLECTOMY    . WISDOM TOOTH EXTRACTION       Medications: Current Meds  Medication Sig  . Alcohol Swabs (ALCOHOL PREP) PADS Use to clean area prior to injection  . aspirin EC 81 MG EC tablet Take 1 tablet (81 mg total) by mouth daily.  [name] atorvastatin (LIPITOR) 80 MG tablet Take 1 tablet (80 mg total) by mouth daily at 6 PM.  . B-D HYPODERMIC NEEDLE 22GX1" 22G X 1" MISC SMARTSIG:1 Injection Every 2 Weeks  . carvedilol (COREG) 6.25 MG tablet Take 1 tablet (6.25 mg total) by mouth 2 (two) times daily.  . clopidogrel (PLAVIX) 75 MG  tablet Take 1 tablet (75 mg total) by mouth daily. Please make yearly appt with Dr. [name] for October before anymore refills. 1st attempt  . isosorbide mononitrate (IMDUR) 60 MG 24 hr tablet TAKE 1 TABLET BY MOUTH EVERY DAY  . losartan (COZAAR) 25 MG tablet Take 1 tablet (25 mg total) by mouth daily.  . Multiple Vitamin (MULTIVITAMIN) capsule Take 1 capsule by mouth daily.  [name] NEEDLE, DISP, 18 G 18G X 1-1/2" MISC Use to draw up testosterone every 14 days  . NEEDLE, DISP, 22 G (BD ECLIPSE NEEDLE) 22G X 1-1/2" MISC Use to inject testosterone every 14 days  . nitroGLYCERIN (NITROSTAT) 0.4 MG SL tablet PLACE 1 TABLET UNDER THE TONGUE EVERY 5 MINUTES FOR 3 DOSES AS NEEDED FOR CHEST PAIN  . pantoprazole  (PROTONIX) 40 MG tablet TAKE 1 TABLET BY MOUTH EVERY DAY  . Syringe, Disposable, (B-D SYRINGE LUER-LOK 3CC) 3 ML MISC Use to inject testosterone every 14 days  . testosterone cypionate (DEPOTESTOSTERONE CYPIONATE) 200 MG/ML injection Inject 0.60ml once every 14 days.  [name] venlafaxine XR (EFFEXOR-XR) 150 MG 24 hr capsule Take 1 capsule (150 mg total) by mouth daily with breakfast.  . [DISCONTINUED] carvedilol (COREG) 6.25 MG tablet Take 1 tablet (6.25 mg total) by mouth 2 (two) times daily. Please make yearly appt with Dr [name] for October for future refills. 1st attempt     Allergies: Allergies  Allergen Reactions  . Morphine And Related Other (See Comments)    dont remember the symptoms    Social History: The patient  reports that he has been smoking e-cigarettes and cigarettes. He has a 30.00 pack-year smoking history. He has never used smokeless tobacco. He reports current alcohol use of about 90.0 standard drinks of alcohol per week. He reports that he does not use drugs.   Family History: The patient's family history includes Breast cancer in his sister; Heart disease in his father; Lung cancer in his mother.   Review of Systems: Please see the history of present illness.   All other systems are reviewed and negative.   Physical Exam: VS:  BP (!) 120/100   Pulse (!) 101   Ht 5\' 10"  (1.778 m)   Wt 147 lb (66.7 kg)   SpO2 96%   BMI 21.09 kg/m  .  BMI Body mass index is 21.09 kg/m.  Wt Readings from Last 3 Encounters:  05/31/20 147 lb (66.7 kg)  05/27/20 148 lb 6.4 oz (67.3 kg)  05/17/20 148 lb 12.8 oz (67.5 kg)    General: Alert and in no acute distress. He weighed 153# last October.   Cardiac: Regular rate and rhythm. His rate is a little fast today. No murmurs, rubs, or gallops. No edema.  Respiratory:  Lungs are coarse and with normal work of breathing.  GI: Soft and nontender.  MS: No deformity or atrophy. Gait and ROM intact.  Skin: Warm and dry. Color is normal.   Neuro:  Strength and sensation are intact and no gross focal deficits noted.  Psych: Alert, appropriate and with normal affect.   LABORATORY DATA:  EKG:  EKG is ordered today.  Personally reviewed by me and Dr. [name]. This demonstrates underlying NSR - inferior Q's and anterior Q's as well. HR is 101 today.  Lab Results  Component Value Date   WBC 5.0 03/18/2020   HGB 13.1 (L) 03/18/2020   HCT 38.2 (L) 03/18/2020   PLT 127 (L) 03/18/2020   GLUCOSE 94 12/31/2019   CHOL 148  12/31/2019   TRIG 126 12/31/2019   HDL 86 12/31/2019   LDLDIRECT 15.0 05/06/2018   LDLCALC 41 12/31/2019   ALT 20 12/31/2019   AST 34 12/31/2019   NA 138 12/31/2019   K 3.9 12/31/2019   CL 101 12/31/2019   CREATININE 0.99 12/31/2019   BUN 22 12/31/2019   CO2 30 12/31/2019   TSH 2.51 12/31/2019   PSA 0.7 12/31/2019   INR 0.99 09/27/2016     BNP (last 3 results) No results for input(s): BNP in the last 8760 hours.  ProBNP (last 3 results) No results for input(s): PROBNP in the last 8760 hours.   Other Studies Reviewed Today:  ECHO 01/11/17: - Left ventricle: The cavity size was normal. Wall thickness was increased in a pattern of mild LVH. Systolic function was mildly reduced. The estimated ejection fraction was in the range of 45% to 50%. No mural thrombus with Definity contrast. Distal anterior, anteroseptal, apical and inferoapical akinesis suggestive of infarct. Doppler parameters are consistent with abnormal left ventricular relaxation (grade 1 diastolic dysfunction). The E/e&' ratio is <8, suggesting normal LV filling pressure. - Mitral valve: Mildly thickened leaflets . There was trivial regurgitation. - Left atrium: The atrium was normal in size. - Systemic veins: The IVC measures >2.1 cm, but collapses more than 50%, suggesting an elevated RA pressure of 8 mmHg.  Impressions:  - Compared to a prior study in 10/2016, the LVEF has improved to 45-50%. There  are persistent distal anterior, anteroseptal, apical and inferoapical wall motion abnormalities consistent with LAD territory infarct/scar.    ASSESSMENT & PLAN:    1. Pre op clearance for EGD - discussed with Dr. [name] here in the office this morning - ok to proceed from our standpoint - no worrisome symptoms and his NTG use is stable. To hold DAPT for 5 days prior and restart as soon as GI says ok. Will send this note to Dr. Fuller Plan.   2. Known CAD with prior MI - stable NTG use - remains on DAPT, beta blocker, nitrate and statin therapy.   2. HTN - BP not great - has just had his medicines.   3. HLD - on statin therapy.   4. Tobacco abuse - not ready to stop.   5. ICM/chronic systolic HF - EF at 94%. Has not tolerated higher doses of medicines due to dizziness.   6. Elevated HR - has just had his medicines. He is not symptomatic - will follow.   Current medicines are reviewed with the patient today.  The patient does not have concerns regarding medicines other than what has been noted above.  The following changes have been made:  See above.  Labs/ tests ordered today include:    Orders Placed This Encounter  Procedures  . EKG 12-Lead     Disposition:   FU with Dr. [name] in about 4 months.     Patient is agreeable to this plan and will call if any problems develop in the interim.   SignedTruitt Merle, NP  05/31/2020 11:04 AM  [facility] [address] [place], [place]  [address] Phone: [phone] Fax: [phone]

## 2020-05-27 ENCOUNTER — Encounter: Payer: Self-pay | Admitting: Family Medicine

## 2020-05-27 ENCOUNTER — Ambulatory Visit (INDEPENDENT_AMBULATORY_CARE_PROVIDER_SITE_OTHER): Payer: 59 | Admitting: Family Medicine

## 2020-05-27 ENCOUNTER — Other Ambulatory Visit: Payer: Self-pay

## 2020-05-27 VITALS — BP 106/80 | HR 103 | Temp 97.7°F | Wt 148.4 lb

## 2020-05-27 DIAGNOSIS — F1010 Alcohol abuse, uncomplicated: Secondary | ICD-10-CM

## 2020-05-27 DIAGNOSIS — R238 Other skin changes: Secondary | ICD-10-CM | POA: Diagnosis not present

## 2020-05-27 DIAGNOSIS — R630 Anorexia: Secondary | ICD-10-CM

## 2020-05-27 DIAGNOSIS — I10 Essential (primary) hypertension: Secondary | ICD-10-CM

## 2020-05-27 DIAGNOSIS — R7989 Other specified abnormal findings of blood chemistry: Secondary | ICD-10-CM | POA: Diagnosis not present

## 2020-05-27 DIAGNOSIS — R233 Spontaneous ecchymoses: Secondary | ICD-10-CM

## 2020-05-27 DIAGNOSIS — F17210 Nicotine dependence, cigarettes, uncomplicated: Secondary | ICD-10-CM

## 2020-05-27 DIAGNOSIS — F331 Major depressive disorder, recurrent, moderate: Secondary | ICD-10-CM

## 2020-05-27 DIAGNOSIS — D539 Nutritional anemia, unspecified: Secondary | ICD-10-CM

## 2020-05-27 NOTE — Patient Instructions (Signed)
Have labs completed.  Work on reduction of alcohol intake and quitting smoking.  See me again in about 6 months or sooner if needed.

## 2020-05-28 ENCOUNTER — Encounter: Payer: Self-pay | Admitting: Family Medicine

## 2020-05-28 DIAGNOSIS — F1010 Alcohol abuse, uncomplicated: Secondary | ICD-10-CM | POA: Insufficient documentation

## 2020-05-28 DIAGNOSIS — F1090 Alcohol use, unspecified, uncomplicated: Secondary | ICD-10-CM | POA: Insufficient documentation

## 2020-05-28 NOTE — Assessment & Plan Note (Signed)
Complains of continued decreased appetite and morning nausea.  Has upcoming EGD and esophageal dilatation.  Discussed that his alcohol intake may be contributing to some of these symptoms.

## 2020-05-28 NOTE — Assessment & Plan Note (Signed)
Update testosterone levels.  

## 2020-05-28 NOTE — Assessment & Plan Note (Signed)
Improved with effexor.  Still has anhedonia but doesn't bother him too much at this point. He will continue on effexor at current dose.

## 2020-05-28 NOTE — Assessment & Plan Note (Signed)
Blood pressure is at goal at for age and co-morbidities.  I recommend continuation of current medications.  In addition they were instructed to follow a low sodium diet with regular exercise to help to maintain adequate control of blood pressure.  ? ?

## 2020-05-28 NOTE — Assessment & Plan Note (Signed)
Counseled on reduction of EtOH intake.  Reviewed short and long term health effects of excesss EtOH MCV elevated and platelets reduced. Bruising easier as well, check PT/INR

## 2020-05-28 NOTE — Assessment & Plan Note (Signed)
He has been counseled extensively on smoking cessation.  Tried chantix and bupropion without quitting.

## 2020-05-28 NOTE — Assessment & Plan Note (Signed)
Normal b12 and folate levels previously  Likely related to his excess alcohol intake.

## 2020-05-28 NOTE — Progress Notes (Signed)
Jeremiah Short - 59 y.o. male MRN [id number]  Date of birth: 1961-04-27  Subjective Chief Complaint  Patient presents with  . Hypertension  . Depression    HPI Jeremiah Short is a 59 y.o. male here today for follow up visit.  He has a history of HTN, CAD with prior MI, excess alcohol intake, depression with anxiety, and low testosterone.  -HTN/CAD:  Current management with carvedilol, isosorbide, and losartan. He is doing well with current medications.  He denies anginal symptoms, headache or vision changes.  He does continue to smoke. He is taking plavix and asa daily.    -Depression/anxiety:  Current treatment with effexor.  This has worked better than anything else he has tried but still has problems feeling motivated.  He does admit to continued heavy alcohol intake each day.  He has not tried to cut back but is aware that he should.    -Early satiety/nausea:  He continues to have problems with early satiety, dysphagia and nausea.  He is taking pantoprazole daily. He is seeing GI and has upcoming endoscopy.    -Low testosterone:  He states that he hasn't noticed a big difference since starting this despite levels being therapeutic. He would like to continue for now.    ROS:  A comprehensive ROS was completed and negative except as noted per HPI  Allergies  Allergen Reactions  . Morphine And Related Other (See Comments)    dont remember the symptoms    Past Medical History:  Diagnosis Date  . Anxiety   . Barrett's esophagus   . Cardiomyopathy in other diseases classified elsewhere   . Coronary artery disease   . Depression   . Diverticulosis   . Emphysema, unspecified ([place])   . Esophageal stricture   . GERD (gastroesophageal reflux disease)   . History of ETOH abuse   . Hypertension   . Myocardial infarction ([place]) 2009  . Pure hypercholesterolemia   . Smoker   . Stented coronary artery   . Tubular adenoma of colon 2012  . Wears glasses     Past Surgical History:   Procedure Laterality Date  . APPENDECTOMY  2011   during colectomy  . CARDIAC CATHETERIZATION  2009   placed 2 stents  . CARDIAC CATHETERIZATION N/A 09/27/2016   Procedure: Left Heart Cath and Coronary Angiography;  Surgeon: [name], MD;  Location: [facility];  Service: Cardiovascular;  Laterality: N/A;  . CARDIAC CATHETERIZATION N/A 09/27/2016   Procedure: Coronary Stent Intervention;  Surgeon: [name], MD;  Location: [facility];  Service: Cardiovascular;  Laterality: N/A;  . CORONARY STENT PLACEMENT    . HERNIA REPAIR    . INGUINAL HERNIA REPAIR Left 09/16/2013   Procedure: HERNIA REPAIR INGUINAL ADULT;  Surgeon: [name], MD;  Location: [facility];  Service: General;  Laterality: Left;  . MOUTH SURGERY    . PARTIAL COLECTOMY  2011   sigmoid  . TONSILLECTOMY    . WISDOM TOOTH EXTRACTION      Social History   Socioeconomic History  . Marital status: Divorced    Spouse name: Not on file  . Number of children: 2  . Years of education: Not on file  . Highest education level: Not on file  Occupational History  . Occupation: Glass blower/designer  Tobacco Use  . Smoking status: Current Every Day Smoker    Packs/day: 0.75    Years: 40.00    Pack years: 30.00  Types: E-cigarettes, Cigarettes  . Smokeless tobacco: Never Used  . Tobacco comment: Pt given handout to quit smoking  Vaping Use  . Vaping Use: Never used  Substance and Sexual Activity  . Alcohol use: Yes    Alcohol/week: 90.0 standard drinks    Types: 90 Shots of liquor per week    Comment: 1/5 vodka every 3 days (750 ml)  . Drug use: No  . Sexual activity: Not on file  Other Topics Concern  . Not on file  Social History Narrative  . Not on file   Social Determinants of Health   Financial Resource Strain:   . Difficulty of Paying Living Expenses: Not on file  Food Insecurity:   . Worried About Charity fundraiser in the Last Year: Not on file  . Ran Out of Food in  the Last Year: Not on file  Transportation Needs:   . Lack of Transportation (Medical): Not on file  . Lack of Transportation (Non-Medical): Not on file  Physical Activity:   . Days of Exercise per Week: Not on file  . Minutes of Exercise per Session: Not on file  Stress:   . Feeling of Stress : Not on file  Social Connections:   . Frequency of Communication with Friends and Family: Not on file  . Frequency of Social Gatherings with Friends and Family: Not on file  . Attends Religious Services: Not on file  . Active Member of Clubs or Organizations: Not on file  . Attends Archivist Meetings: Not on file  . Marital Status: Not on file    Family History  Problem Relation Age of Onset  . Lung cancer Mother   . Heart disease Father        has pacemaker  . Breast cancer Sister   . Colon cancer Neg Hx   . Colon polyps Neg Hx   . Esophageal cancer Neg Hx   . Kidney disease Neg Hx   . Diabetes Neg Hx   . Stomach cancer Neg Hx   . Rectal cancer Neg Hx     Health Maintenance  Topic Date Due  . INFLUENZA VACCINE  05/02/2020  . COLONOSCOPY  09/24/2021  . TETANUS/TDAP  10/23/2028  . COVID-19 Vaccine  Completed  . Hepatitis C Screening  Completed  . HIV Screening  Discontinued     ----------------------------------------------------------------------------------------------------------------------------------------------------------------------------------------------------------------- Physical Exam BP 106/80 (BP Location: Left Arm, Patient Position: Sitting, Cuff Size: Normal)   Pulse (!) 103   Temp 97.7 F (36.5 C) (Oral)   Wt 148 lb 6.4 oz (67.3 kg)   SpO2 100%   BMI 21.29 kg/m   Physical Exam Constitutional:      Appearance: Normal appearance.  HENT:     Head: Normocephalic and atraumatic.  Eyes:     General: No scleral icterus. Cardiovascular:     Rate and Rhythm: Normal rate and regular rhythm.  Pulmonary:     Effort: Pulmonary effort is normal.      Breath sounds: Normal breath sounds.  Musculoskeletal:     Cervical back: Neck supple.  Neurological:     General: No focal deficit present.     Mental Status: He is alert.  Psychiatric:        Mood and Affect: Mood normal.     ------------------------------------------------------------------------------------------------------------------------------------------------------------------------------------------------------------------- Assessment and Plan  Hypertension Blood pressure is at goal at for age and co-morbidities.  I recommend continuation of current medications.  In addition they were instructed to follow a  low sodium diet with regular exercise to help to maintain adequate control of blood pressure.    Nicotine dependence, cigarettes, uncomplicated He has been counseled extensively on smoking cessation.  Tried chantix and bupropion without quitting.   MDD (major depressive disorder), recurrent episode, moderate ([facility]) Improved with effexor.  Still has anhedonia but doesn't bother him too much at this point. He will continue on effexor at current dose.   Macrocytic anemia Normal b12 and folate levels previously  Likely related to his excess alcohol intake.   Low testosterone Update testosterone levels   Decreased appetite Complains of continued decreased appetite and morning nausea.  Has upcoming EGD and esophageal dilatation.  Discussed that his alcohol intake may be contributing to some of these symptoms.   Excessive drinking alcohol Counseled on reduction of EtOH intake.  Reviewed short and long term health effects of excesss EtOH MCV elevated and platelets reduced. Bruising easier as well, check PT/INR   No orders of the defined types were placed in this encounter.  Orders Placed This Encounter  Procedures  . COMPLETE METABOLIC PANEL WITH GFR  . Testosterone  . INR/PT  . CBC     Return in about 6 months (around 11/27/2020) for  HTN/Depression.    This visit occurred during the SARS-CoV-2 public health emergency.  Safety protocols were in place, including screening questions prior to the visit, additional usage of staff PPE, and extensive cleaning of exam room while observing appropriate contact time as indicated for disinfecting solutions.

## 2020-05-31 ENCOUNTER — Other Ambulatory Visit: Payer: Self-pay

## 2020-05-31 ENCOUNTER — Ambulatory Visit (INDEPENDENT_AMBULATORY_CARE_PROVIDER_SITE_OTHER): Payer: 59 | Admitting: Nurse Practitioner

## 2020-05-31 ENCOUNTER — Encounter: Payer: Self-pay | Admitting: Nurse Practitioner

## 2020-05-31 VITALS — BP 120/100 | HR 101 | Ht 70.0 in | Wt 147.0 lb

## 2020-05-31 DIAGNOSIS — I10 Essential (primary) hypertension: Secondary | ICD-10-CM

## 2020-05-31 DIAGNOSIS — Z01810 Encounter for preprocedural cardiovascular examination: Secondary | ICD-10-CM

## 2020-05-31 DIAGNOSIS — I2510 Atherosclerotic heart disease of native coronary artery without angina pectoris: Secondary | ICD-10-CM | POA: Diagnosis not present

## 2020-05-31 DIAGNOSIS — E7800 Pure hypercholesterolemia, unspecified: Secondary | ICD-10-CM

## 2020-05-31 DIAGNOSIS — Z720 Tobacco use: Secondary | ICD-10-CM

## 2020-05-31 MED ORDER — CARVEDILOL 6.25 MG PO TABS: 6.2500 mg | ORAL_TABLET | Freq: Two times a day (BID) | ORAL | 3 refills | Status: DC

## 2020-05-31 NOTE — Patient Instructions (Addendum)
After Visit Summary:  We will be checking the following labs today - NONE   Medication Instructions:    Continue with your current medicines.    If you need a refill on your cardiac medications before your next appointment, please call your pharmacy.     Testing/Procedures To Be Arranged:  N/A  Follow-Up:   See Dr. [name] in about 4 months    At [facility], you and your health needs are our priority.  As part of our continuing mission to provide you with exceptional heart care, we have created designated Provider Care Teams.  These Care Teams include your primary Cardiologist (physician) and Advanced Practice Providers (APPs -  Physician Assistants and Nurse Practitioners) who all work together to provide you with the care you need, when you need it.  Special Instructions:  . Stay safe, wash your hands for at least 20 seconds and wear a mask when needed.  . It was good to talk with you today.  . I will send a note to Dr. Fuller Plan - hold aspirin and Plavix for 5 days prior to your procedure.    Call the [facility] office at [phone] if you have any questions, problems or concerns.

## 2020-05-31 NOTE — Telephone Encounter (Signed)
Per Cardiology at appt on 05/31/20 patient can hold Plavix x 5 days prior to EGD. Patient was told at appt.

## 2020-06-17 ENCOUNTER — Encounter: Payer: 59 | Admitting: Gastroenterology

## 2020-06-17 ENCOUNTER — Other Ambulatory Visit: Payer: Self-pay

## 2020-06-17 ENCOUNTER — Ambulatory Visit (AMBULATORY_SURGERY_CENTER): Payer: Self-pay | Admitting: Gastroenterology

## 2020-06-17 ENCOUNTER — Encounter: Payer: Self-pay | Admitting: Gastroenterology

## 2020-06-17 VITALS — BP 119/88 | HR 106 | Temp 97.5°F | Resp 24 | Ht 70.0 in | Wt 148.0 lb

## 2020-06-17 DIAGNOSIS — K2270 Barrett's esophagus without dysplasia: Secondary | ICD-10-CM

## 2020-06-17 DIAGNOSIS — R1310 Dysphagia, unspecified: Secondary | ICD-10-CM

## 2020-06-17 DIAGNOSIS — K2970 Gastritis, unspecified, without bleeding: Secondary | ICD-10-CM

## 2020-06-17 DIAGNOSIS — K449 Diaphragmatic hernia without obstruction or gangrene: Secondary | ICD-10-CM

## 2020-06-17 DIAGNOSIS — R1319 Other dysphagia: Secondary | ICD-10-CM

## 2020-06-17 DIAGNOSIS — K3189 Other diseases of stomach and duodenum: Secondary | ICD-10-CM

## 2020-06-17 DIAGNOSIS — K228 Other specified diseases of esophagus: Secondary | ICD-10-CM

## 2020-06-17 DIAGNOSIS — K224 Dyskinesia of esophagus: Secondary | ICD-10-CM

## 2020-06-17 DIAGNOSIS — K222 Esophageal obstruction: Secondary | ICD-10-CM

## 2020-06-17 MED ORDER — SODIUM CHLORIDE 0.9 % IV SOLN: 500.0000 mL | Freq: Once | INTRAVENOUS | Status: DC

## 2020-06-17 NOTE — Progress Notes (Signed)
A and O x3. Report to RN. Tolerated MAC anesthesia well.Teeth unchanged after procedure.

## 2020-06-17 NOTE — Progress Notes (Signed)
Called to room to assist during endoscopic procedure.  Patient ID and intended procedure confirmed with present staff. Received instructions for my participation in the procedure from the performing physician.  

## 2020-06-17 NOTE — Patient Instructions (Addendum)
FOLLOW DILATATION DIET TODAY ( HANDOUT GIVEN TO YOU TODAY)   RESUME PLAVIX( AT USUAL DOSE ) IN 2 DAYS   Await biopsy results    HANDOUT ON ESOPHAGITIS GIVEN TO YOU TODAY   YOU HAD AN ENDOSCOPIC PROCEDURE TODAY AT [facility]:   Refer to the procedure report that was given to you for any specific questions about what was found during the examination.  If the procedure report does not answer your questions, please call your gastroenterologist to clarify.  If you requested that your care partner not be given the details of your procedure findings, then the procedure report has been included in a sealed envelope for you to review at your convenience later.  YOU SHOULD EXPECT: Some feelings of bloating in the abdomen. Passage of more gas than usual.  Walking can help get rid of the air that was put into your GI tract during the procedure and reduce the bloating. If you had a lower endoscopy (such as a colonoscopy or flexible sigmoidoscopy) you may notice spotting of blood in your stool or on the toilet paper. If you underwent a bowel prep for your procedure, you may not have a normal bowel movement for a few days.  Please Note:  You might notice some irritation and congestion in your nose or some drainage.  This is from the oxygen used during your procedure.  There is no need for concern and it should clear up in a day or so.  SYMPTOMS TO REPORT IMMEDIATELY:     Following upper endoscopy (EGD)  Vomiting of blood or coffee ground material  New chest pain or pain under the shoulder blades  Painful or persistently difficult swallowing  New shortness of breath  Fever of 100F or higher  Black, tarry-looking stools  For urgent or emergent issues, a gastroenterologist can be reached at any hour by calling [phone]. Do not use MyChart messaging for urgent concerns.    DIET:  We do recommend a small meal at first, but then you may proceed to your regular diet.  Drink  plenty of fluids but you should avoid alcoholic beverages for 24 hours.  ACTIVITY:  You should plan to take it easy for the rest of today and you should NOT DRIVE or use heavy machinery until tomorrow (because of the sedation medicines used during the test).    FOLLOW UP: Our staff will call the number listed on your records 48-72 hours following your procedure to check on you and address any questions or concerns that you may have regarding the information given to you following your procedure. If we do not reach you, we will leave a message.  We will attempt to reach you two times.  During this call, we will ask if you have developed any symptoms of COVID 19. If you develop any symptoms (ie: fever, flu-like symptoms, shortness of breath, cough etc.) before then, please call [phone].  If you test positive for Covid 19 in the 2 weeks post procedure, please call and report this information to [place].    If any biopsies were taken you will be contacted by phone or by letter within the next 1-3 weeks.  Please call us at [phone] if you have not heard about the biopsies in 3 weeks.    SIGNATURES/CONFIDENTIALITY: You and/or your care partner have signed paperwork which will be entered into your electronic medical record.  These signatures attest to the fact that that the information above  on your After Visit Summary has been reviewed and is understood.  Full responsibility of the confidentiality of this discharge information lies with you and/or your care-partner.

## 2020-06-17 NOTE — Op Note (Signed)
[facility] Patient Name: Jeremiah Short Procedure Date: 06/17/2020 9:46 AM MRN: [id number] Endoscopist: Ladene Artist , MD Age: 59 Referring MD:  Date of Birth: [birth year] Gender: Male Account #: [id number] Procedure:                Upper GI endoscopy Indications:              Dysphagia Medicines:                Monitored Anesthesia Care Procedure:                Pre-Anesthesia Assessment:                           - Prior to the procedure, a History and Physical                            was performed, and patient medications and                            allergies were reviewed. The patient's tolerance of                            previous anesthesia was also reviewed. The risks                            and benefits of the procedure and the sedation                            options and risks were discussed with the patient.                            All questions were answered, and informed consent                            was obtained. Prior Anticoagulants: The patient has                            taken Plavix (clopidogrel), last dose was 5 days                            prior to procedure. ASA Grade Assessment: III - A                            patient with severe systemic disease. After                            reviewing the risks and benefits, the patient was                            deemed in satisfactory condition to undergo the                            procedure.  After obtaining informed consent, the endoscope was                            passed under direct vision. Throughout the                            procedure, the patient's blood pressure, pulse, and                            oxygen saturations were monitored continuously. The                            Endoscope was introduced through the mouth, and                            advanced to the second part of duodenum. The upper                            GI  endoscopy was accomplished without difficulty.                            The patient tolerated the procedure well. Scope In: Scope Out: Findings:                 One benign-appearing, intrinsic moderate stenosis                            was found 41 cm from the incisors. This stenosis                            measured 1.2 cm (inner diameter) x less than one cm                            (in length). The stenosis was traversed. A                            guidewire was placed and the scope was withdrawn.                            Dilations were performed with Savary dilators with                            mild resistance at 13 mm, 14 mm, 15 mm.                           The lumen of the proximal esophagus and mid                            esophagus was mildly dilated with exudates,                            biopsies obtained.  There were esophageal mucosal changes suggestive of                            short-segment Barrett's esophagus present in the                            distal esophagus from 41-43 cm. The maximum                            longitudinal extent of these mucosal changes was 2                            cm in length.                           A small hiatal hernia was present.                           Diffuse mild inflammation characterized by erythema                            and granularity was found in the entire examined                            stomach.                           A small amount of food (residue) was found in the                            gastric fundus and in the gastric body.                           The exam of the stomach was otherwise normal.                           The duodenal bulb and second portion of the                            duodenum were normal. Complications:            No immediate complications. Estimated Blood Loss:     Estimated blood loss was minimal. Impression:               -  Benign-appearing esophageal stenosis. Dilated.                           - Dilation in the proximal esophagus and in the mid                            esophagus with exudates. Biopsied.                           - Esophageal mucosal changes suggestive of  short-segment Barrett's esophagus. Biopsied.                           - Small hiatal hernia.                           - Gastritis.                           - A small amount of food (residue) in the stomach.                           - Normal duodenal bulb and second portion of the                            duodenum.                           - No specimens collected. Recommendation:           - Patient has a contact number available for                            emergencies. The signs and symptoms of potential                            delayed complications were discussed with the                            patient. Return to normal activities tomorrow.                            Written discharge instructions were provided to the                            patient.                           - Clear liquid diet for 2 hours, then advance as                            tolerated to soft diet today.                           - Continue present medications.                           - Await pathology results.                           - Return to GI office in 6 weeks.                           - Resume Plavix (clopidogrel) at prior dose in 2                            days. Refer to managing  physician for further                            adjustment of therapy. Ladene Artist, MD 06/17/2020 10:33:40 AM This report has been signed electronically.

## 2020-06-17 NOTE — Progress Notes (Signed)
Lidocaine buffer  robinol antisialogogue 

## 2020-06-20 ENCOUNTER — Other Ambulatory Visit: Payer: Self-pay | Admitting: Family Medicine

## 2020-06-20 DIAGNOSIS — R7989 Other specified abnormal findings of blood chemistry: Secondary | ICD-10-CM

## 2020-06-21 ENCOUNTER — Telehealth: Payer: Self-pay

## 2020-06-21 NOTE — Telephone Encounter (Signed)
  Follow up Call-  Call back number 06/17/2020 10/10/2019 10/06/2019 09/24/2018  Post procedure Call Back phone  # [phone]  Permission to leave phone message Yes Yes Yes -  Some recent data might be hidden     Patient questions:  Do you have a fever, pain , or abdominal swelling? No. Pain Score  0 *  Have you tolerated food without any problems? Yes.    Have you been able to return to your normal activities? Yes.    Do you have any questions about your discharge instructions: Diet   No. Medications  No. Follow up visit  No.  Do you have questions or concerns about your Care? No.  Actions: * If pain score is 4 or above: No action needed, pain <4.   1. Have you developed a fever since your procedure? No   2.   Have you had an respiratory symptoms (SOB or cough) since your procedure? No   3.   Have you tested positive for COVID 19 since your procedure? No   4.   Have you had any family members/close contacts diagnosed with the COVID 19 since your procedure?  No    If yes to any of these questions please route to Joylene John, RN and Joella Prince, RN

## 2020-06-26 ENCOUNTER — Other Ambulatory Visit: Payer: Self-pay | Admitting: Family Medicine

## 2020-06-28 ENCOUNTER — Encounter: Payer: Self-pay | Admitting: Gastroenterology

## 2020-08-05 ENCOUNTER — Other Ambulatory Visit: Payer: Self-pay | Admitting: Family Medicine

## 2020-08-12 ENCOUNTER — Other Ambulatory Visit: Payer: Self-pay

## 2020-08-12 ENCOUNTER — Telehealth: Payer: Self-pay | Admitting: Gastroenterology

## 2020-08-12 ENCOUNTER — Telehealth: Payer: Self-pay

## 2020-08-12 DIAGNOSIS — R1319 Other dysphagia: Secondary | ICD-10-CM

## 2020-08-12 NOTE — Telephone Encounter (Signed)
Please schedule EGD/dilation in [place] off Plavix for 5 days.

## 2020-08-12 NOTE — Telephone Encounter (Signed)
   Primary Cardiologist: Candee Furbish, MD  Chart reviewed as part of pre-operative protocol coverage.   Per previous recommendations by Dr. [name] and given lack of interval change in cardiac history, patient can hold plavix 5 days prior to his upcoming EGD with dilation. Plavix should be restarted as soon as he is cleared to do so by his gastroenterologist.   I will route this recommendation to the requesting party via Epic fax function and remove from pre-op pool.  Please call with questions.  Abigail Butts, PA-C 08/12/2020, 3:31 PM

## 2020-08-12 NOTE — Telephone Encounter (Signed)
Patient called states he is still having problems swallowing and is seeking advise

## 2020-08-12 NOTE — Telephone Encounter (Signed)
Called patient back, and he states his dysphagia has not gotten any better with the dilation done on 06/17/20. He is still having difficulty swallowing even noodles. He is requesting another dilation. Please advise

## 2020-08-12 NOTE — Telephone Encounter (Signed)
[facility] Pre-operative Risk Assessment     Request for surgical clearance:     Endoscopy Procedure  What type of surgery is being performed?     EGD with dilation  When is this surgery scheduled?     08/23/20  What type of clearance is required ?   Pharmacy  Are there any medications that need to be held prior to surgery and how long? Plavix - 5 days  Practice name and name of physician performing surgery?      [facility] - Dr. Fuller Plan  What is your office phone and fax number?      Phone- [phone]  [phone]  Anesthesia type (None, local, MAC, general) ?       MAC

## 2020-08-12 NOTE — Telephone Encounter (Signed)
Scheduled patient for EGD w/dilation on 08/23/20 in the [facility]. Request to Hold Plavix 5 days before sent to [facility]. Patient has had both COVID vaccines. Instructions sent via MyChart. Patient aware of all the above

## 2020-08-13 NOTE — Telephone Encounter (Signed)
Please proceed with EGD/dilation with Plavix on hold for 5 days as planned.

## 2020-08-19 ENCOUNTER — Telehealth: Payer: Self-pay | Admitting: Gastroenterology

## 2020-08-19 LAB — LIPID PANEL
Cholesterol: 124 (ref 0–200)
HDL: 88 — AB (ref 35–70)
LDL Cholesterol: 22
LDl/HDL Ratio: 1.4
Triglycerides: 67 (ref 40–160)

## 2020-08-19 LAB — BASIC METABOLIC PANEL: Glucose: 118

## 2020-08-19 NOTE — Telephone Encounter (Signed)
Spoke with pt and needs instructions sent via MyChart.  He stopped his Plavix on 08-18-20 and knows to not start until after procedure 08-23-20

## 2020-08-19 NOTE — Telephone Encounter (Signed)
Instructions were on already put on my chart on 08/12/20 by Hughie Closs, RN. Resent instructions to patient on my chart.

## 2020-08-21 ENCOUNTER — Other Ambulatory Visit: Payer: Self-pay | Admitting: Cardiology

## 2020-08-23 ENCOUNTER — Ambulatory Visit (AMBULATORY_SURGERY_CENTER): Payer: Self-pay | Admitting: Gastroenterology

## 2020-08-23 ENCOUNTER — Other Ambulatory Visit: Payer: Self-pay

## 2020-08-23 ENCOUNTER — Encounter: Payer: Self-pay | Admitting: Gastroenterology

## 2020-08-23 VITALS — BP 121/86 | HR 117 | Temp 97.8°F | Resp 23 | Ht 70.0 in | Wt 148.0 lb

## 2020-08-23 DIAGNOSIS — K449 Diaphragmatic hernia without obstruction or gangrene: Secondary | ICD-10-CM

## 2020-08-23 DIAGNOSIS — K222 Esophageal obstruction: Secondary | ICD-10-CM

## 2020-08-23 DIAGNOSIS — K2970 Gastritis, unspecified, without bleeding: Secondary | ICD-10-CM

## 2020-08-23 DIAGNOSIS — R1310 Dysphagia, unspecified: Secondary | ICD-10-CM

## 2020-08-23 MED ORDER — SODIUM CHLORIDE 0.9 % IV SOLN: 500.0000 mL | Freq: Once | INTRAVENOUS | Status: DC

## 2020-08-23 NOTE — Progress Notes (Signed)
Called to room to assist during endoscopic procedure.  Patient ID and intended procedure confirmed with present staff. Received instructions for my participation in the procedure from the performing physician.  

## 2020-08-23 NOTE — Op Note (Signed)
[facility] Patient Name: Jeremiah Short Procedure Date: 08/23/2020 11:13 AM MRN: [id number] Endoscopist: Ladene Artist , MD Age: 59 Referring MD:  Date of Birth: Jul 25, 1961 Gender: Male Account #: [id number] Procedure:                Upper GI endoscopy Indications:              Dysphagia Medicines:                Monitored Anesthesia Care Procedure:                Pre-Anesthesia Assessment:                           - Prior to the procedure, a History and Physical                            was performed, and patient medications and                            allergies were reviewed. The patient's tolerance of                            previous anesthesia was also reviewed. The risks                            and benefits of the procedure and the sedation                            options and risks were discussed with the patient.                            All questions were answered, and informed consent                            was obtained. Prior Anticoagulants: The patient has                            taken Plavix (clopidogrel), last dose was 5 days                            prior to procedure. ASA Grade Assessment: II - A                            patient with mild systemic disease. After reviewing                            the risks and benefits, the patient was deemed in                            satisfactory condition to undergo the procedure.                           After obtaining informed consent, the endoscope was  passed under direct vision. Throughout the                            procedure, the patient's blood pressure, pulse, and                            oxygen saturations were monitored continuously. The                            Endoscope was introduced through the mouth, and                            advanced to the second part of duodenum. The upper                            GI endoscopy was accomplished  without difficulty.                            The patient tolerated the procedure well. Scope In: Scope Out: Findings:                 The lumen of the proximal esophagus and mid                            esophagus was mildly dilated.                           One benign-appearing, intrinsic mild stenosis was                            found 41 cm from the incisors. This stenosis                            measured 1.3 cm (inner diameter). The stenosis was                            traversed. A guidewire was placed and the scope was                            withdrawn. Dilations were performed with Savary                            dilators with mild resistance at 15 mm, 16 mm and                            17 mm. No heme noted.                           There were esophageal mucosal changes secondary to                            established short-segment Barrett's disease present  in the distal esophagus. The maximum longitudinal                            extent of these mucosal changes was 2 cm in length.                           Diffuse moderate inflammation characterized by                            erythema and granularity was found in the entire                            examined stomach.                           A small hiatal hernia was present.                           The duodenal bulb and second portion of the                            duodenum were normal. Complications:            No immediate complications. Estimated Blood Loss:     Estimated blood loss: none. Impression:               - Dilation in the proximal esophagus and in the mid                            esophagus.                           - Benign-appearing esophageal stenosis. Dilated.                           - Esophageal mucosal changes secondary to                            established short-segment Barrett's disease.                           - Gastritis.                            - Small hiatal hernia.                           - Normal duodenal bulb and second portion of the                            duodenum.                           - No specimens collected. Recommendation:           - Patient has a contact number available for  emergencies. The signs and symptoms of potential                            delayed complications were discussed with the                            patient. Return to normal activities tomorrow.                            Written discharge instructions were provided to the                            patient.                           - Clear liquid diet for 2 hours, then advance as                            tolerated to soft diet today.                           - Resume prior diet tomorrow.                           - Antireflux measures.                           - Continue present medications.                           - If dysphagia persists recommend esophageal                            manometry.                           - Return to GI office in 6 weeks.                           - Resume Plavix (clopidogrel) at prior dose                            tomorrow. Refer to managing physician for further                            adjustment of therapy. Ladene Artist, MD 08/23/2020 11:31:46 AM This report has been signed electronically.

## 2020-08-23 NOTE — Progress Notes (Signed)
Vs in adm- M Tillman Abide

## 2020-08-23 NOTE — Patient Instructions (Signed)
Handouts given for Post-esophageal diet, Stricture, Barrett's esophagus and Hiatal Hernia.  Follow the post-dilation diet today, then resume regular food tomorrow.    Resume your Plavix tomorrow.  YOU HAD AN ENDOSCOPIC PROCEDURE TODAY AT [facility]:   Refer to the procedure report that was given to you for any specific questions about what was found during the examination.  If the procedure report does not answer your questions, please call your gastroenterologist to clarify.  If you requested that your care partner not be given the details of your procedure findings, then the procedure report has been included in a sealed envelope for you to review at your convenience later.  YOU SHOULD EXPECT: Some feelings of bloating in the abdomen. Passage of more gas than usual.  Walking can help get rid of the air that was put into your GI tract during the procedure and reduce the bloating. If you had a lower endoscopy (such as a colonoscopy or flexible sigmoidoscopy) you may notice spotting of blood in your stool or on the toilet paper. If you underwent a bowel prep for your procedure, you may not have a normal bowel movement for a few days.  Please Note:  You might notice some irritation and congestion in your nose or some drainage.  This is from the oxygen used during your procedure.  There is no need for concern and it should clear up in a day or so.  SYMPTOMS TO REPORT IMMEDIATELY:    Following upper endoscopy (EGD)  Vomiting of blood or coffee ground material  New chest pain or pain under the shoulder blades  Painful or persistently difficult swallowing  New shortness of breath  Fever of 100F or higher  Black, tarry-looking stools  For urgent or emergent issues, a gastroenterologist can be reached at any hour by calling [phone]. Do not use MyChart messaging for urgent concerns.    DIET:  We do recommend a small meal at first, but then you may proceed to your regular  diet.  Drink plenty of fluids but you should avoid alcoholic beverages for 24 hours.  ACTIVITY:  You should plan to take it easy for the rest of today and you should NOT DRIVE or use heavy machinery until tomorrow (because of the sedation medicines used during the test).    FOLLOW UP: Our staff will call the number listed on your records 48-72 hours following your procedure to check on you and address any questions or concerns that you may have regarding the information given to you following your procedure. If we do not reach you, we will leave a message.  We will attempt to reach you two times.  During this call, we will ask if you have developed any symptoms of COVID 19. If you develop any symptoms (ie: fever, flu-like symptoms, shortness of breath, cough etc.) before then, please call [phone].  If you test positive for Covid 19 in the 2 weeks post procedure, please call and report this information to [place].    If any biopsies were taken you will be contacted by phone or by letter within the next 1-3 weeks.  Please call us at [phone] if you have not heard about the biopsies in 3 weeks.    SIGNATURES/CONFIDENTIALITY: You and/or your care partner have signed paperwork which will be entered into your electronic medical record.  These signatures attest to the fact that that the information above on your After Visit Summary has been reviewed and is  understood.  Full responsibility of the confidentiality of this discharge information lies with you and/or your care-partner. 

## 2020-08-23 NOTE — Progress Notes (Signed)
Report to PACU, RN, vss, BBS= Clear.  

## 2020-08-24 ENCOUNTER — Telehealth: Payer: Self-pay

## 2020-08-24 NOTE — Telephone Encounter (Signed)
Left message on answering machine. 

## 2020-09-16 ENCOUNTER — Ambulatory Visit: Payer: BC Managed Care – PPO | Admitting: Cardiology

## 2020-10-08 ENCOUNTER — Other Ambulatory Visit: Payer: Self-pay | Admitting: Family Medicine

## 2020-10-11 ENCOUNTER — Telehealth: Payer: Self-pay | Admitting: Neurology

## 2020-10-11 NOTE — Telephone Encounter (Signed)
Prior Authorization for Testosterone submitted via covermymeds. Approved 10/11/2020 - 10/11/2021.

## 2020-10-26 ENCOUNTER — Other Ambulatory Visit: Payer: Self-pay | Admitting: Cardiology

## 2020-10-26 ENCOUNTER — Other Ambulatory Visit: Payer: Self-pay | Admitting: Family Medicine

## 2020-10-26 ENCOUNTER — Other Ambulatory Visit: Payer: Self-pay | Admitting: Nurse Practitioner

## 2020-10-26 DIAGNOSIS — I2510 Atherosclerotic heart disease of native coronary artery without angina pectoris: Secondary | ICD-10-CM

## 2020-10-26 DIAGNOSIS — R0789 Other chest pain: Secondary | ICD-10-CM

## 2020-11-05 ENCOUNTER — Other Ambulatory Visit: Payer: Self-pay

## 2020-11-05 ENCOUNTER — Ambulatory Visit (INDEPENDENT_AMBULATORY_CARE_PROVIDER_SITE_OTHER): Payer: BC Managed Care – PPO | Admitting: Family Medicine

## 2020-11-05 ENCOUNTER — Encounter: Payer: Self-pay | Admitting: Family Medicine

## 2020-11-05 VITALS — BP 117/83 | HR 110 | Wt 156.6 lb

## 2020-11-05 DIAGNOSIS — F419 Anxiety disorder, unspecified: Secondary | ICD-10-CM

## 2020-11-05 DIAGNOSIS — F1010 Alcohol abuse, uncomplicated: Secondary | ICD-10-CM | POA: Diagnosis not present

## 2020-11-05 DIAGNOSIS — I10 Essential (primary) hypertension: Secondary | ICD-10-CM | POA: Diagnosis not present

## 2020-11-05 DIAGNOSIS — F331 Major depressive disorder, recurrent, moderate: Secondary | ICD-10-CM

## 2020-11-05 DIAGNOSIS — F909 Attention-deficit hyperactivity disorder, unspecified type: Secondary | ICD-10-CM | POA: Insufficient documentation

## 2020-11-05 DIAGNOSIS — F32A Depression, unspecified: Secondary | ICD-10-CM

## 2020-11-05 MED ORDER — BUSPIRONE HCL 10 MG PO TABS: 10.0000 mg | ORAL_TABLET | Freq: Two times a day (BID) | ORAL | 3 refills | Status: DC

## 2020-11-05 NOTE — Patient Instructions (Signed)
Continue effexor Add buspar 10mg  twice daily.  See me again in about 3 months.

## 2020-11-07 NOTE — Assessment & Plan Note (Signed)
BP remains well controlled at this time.  Continue current anti-hypertensive medications.

## 2020-11-07 NOTE — Assessment & Plan Note (Signed)
Effexor has been most effective medication.  Will see if better management of anxiety addition of therapy is helpful.

## 2020-11-07 NOTE — Assessment & Plan Note (Signed)
Will add buspar on in addition to effexor.  Referral placed to therapist.

## 2020-11-07 NOTE — Assessment & Plan Note (Signed)
Continued to counsel on depressant effects of alcohol and recommend continuing to cut back with goal of quitting.

## 2020-11-07 NOTE — Progress Notes (Signed)
Jeremiah Short - 60 y.o. male MRN [id number]  Date of birth: 03-Jul-1961  Subjective Chief Complaint  Patient presents with  . Hypertension    HPI Jeremiah Short is a 60 y.o. male here today for follow up visit.  He has a history of HTN, CAD, HLD, alcohol dependence and depression with anxiety.    HTN currently managed with coreg, losartan, and imdur.  Tolerating well at this time.  Denies side effects.  He has not had chest pain, shortness of breath, palpitations, headache or vision changes.  He continues to smoke, doesn't feel like he is ready to quit at this time.    Depression treated with effexor.  He has tried several medications before finding that effexor worked pretty well for his depression.  He has realized that he also has some anxiety that seems to worsen his depression.  He would be interested in seeing a therapist.  He does continues to consume [facility] daily but has cut back on this.  Depression screen [facility] 2/9 11/05/2020 05/27/2020 02/25/2020  Decreased Interest 3 0 3  Down, Depressed, Hopeless 3 1 2   PHQ - 2 Score 6 1 5   Altered sleeping 3 - 0  Tired, decreased energy 2 - 1  Change in appetite 1 - 0  Feeling bad or failure about yourself  3 - 2  Trouble concentrating 3 - 0  Moving slowly or fidgety/restless 0 - 0  Suicidal thoughts 1 - 0  PHQ-9 Score 19 - 8  Difficult doing work/chores Very difficult - -   GAD 7 : Generalized Anxiety Score 11/05/2020 02/25/2020 12/31/2019 02/26/2019  Nervous, Anxious, on Edge 3 1 1 1   Control/stop worrying 3 0 0 2  Worry too much - different things 2 0 0 0  Trouble relaxing 3 0 1 1  Restless 0 0 0 0  Easily annoyed or irritable 2 1 1 3   Afraid - awful might happen 0 0 0 0  Total GAD 7 Score 13 2 3 7   Anxiety Difficulty - Very difficult Somewhat difficult -      ROS:  A comprehensive ROS was completed and negative except as noted per HPI    Allergies  Allergen Reactions  . Morphine And Related Other (See Comments)    dont remember the  symptoms    Past Medical History:  Diagnosis Date  . Anxiety   . Barrett's esophagus   . Cardiomyopathy in other diseases classified elsewhere   . Coronary artery disease   . Depression   . Diverticulosis   . Emphysema, unspecified ([place])   . Esophageal stricture   . GERD (gastroesophageal reflux disease)   . History of ETOH abuse   . Hypertension   . Myocardial infarction ([place]) 2009  . Pure hypercholesterolemia   . Smoker   . Stented coronary artery   . Tubular adenoma of colon 2012  . Wears glasses     Past Surgical History:  Procedure Laterality Date  . APPENDECTOMY  2011   during colectomy  . CARDIAC CATHETERIZATION  2009   placed 2 stents  . CARDIAC CATHETERIZATION N/A 09/27/2016   Procedure: Left Heart Cath and Coronary Angiography;  Surgeon: [name], MD;  Location: [facility];  Service: Cardiovascular;  Laterality: N/A;  . CARDIAC CATHETERIZATION N/A 09/27/2016   Procedure: Coronary Stent Intervention;  Surgeon: [name], MD;  Location: [facility] [facility];  Service: Cardiovascular;  Laterality: N/A;  . CORONARY STENT PLACEMENT    . HERNIA REPAIR    .  INGUINAL HERNIA REPAIR Left 09/16/2013   Procedure: HERNIA REPAIR INGUINAL ADULT;  Surgeon: [name], MD;  Location: [place] [facility];  Service: General;  Laterality: Left;  . MOUTH SURGERY    . PARTIAL COLECTOMY  2011   sigmoid  . TONSILLECTOMY    . WISDOM TOOTH EXTRACTION      Social History   Socioeconomic History  . Marital status: Divorced    Spouse name: Not on file  . Number of children: 2  . Years of education: Not on file  . Highest education level: Not on file  Occupational History  . Occupation: Glass blower/designer  Tobacco Use  . Smoking status: Current Every Day Smoker    Packs/day: 0.75    Years: 40.00    Pack years: 30.00    Types: E-cigarettes, Cigarettes  . Smokeless tobacco: Never Used  . Tobacco comment: Pt given handout to quit smoking  Vaping Use  .  Vaping Use: Never used  Substance and Sexual Activity  . Alcohol use: Yes    Alcohol/week: 90.0 standard drinks    Types: 90 Shots of liquor per week    Comment: 1/5 vodka every 3 days (750 ml)  . Drug use: No  . Sexual activity: Not on file  Other Topics Concern  . Not on file  Social History Narrative  . Not on file   Social Determinants of Health   Financial Resource Strain: Not on file  Food Insecurity: Not on file  Transportation Needs: Not on file  Physical Activity: Not on file  Stress: Not on file  Social Connections: Not on file    Family History  Problem Relation Age of Onset  . Lung cancer Mother   . Heart disease Father        has pacemaker  . Breast cancer Sister   . Colon cancer Neg Hx   . Colon polyps Neg Hx   . Esophageal cancer Neg Hx   . Kidney disease Neg Hx   . Diabetes Neg Hx   . Stomach cancer Neg Hx   . Rectal cancer Neg Hx     Health Maintenance  Topic Date Due  . COVID-19 Vaccine (4 - Booster for Pfizer series) 04/07/2021  . COLONOSCOPY (Pts 45-37yrs Insurance coverage will need to be confirmed)  09/24/2021  . TETANUS/TDAP  10/23/2028  . INFLUENZA VACCINE  Completed  . Hepatitis C Screening  Completed  . HIV Screening  Discontinued     ----------------------------------------------------------------------------------------------------------------------------------------------------------------------------------------------------------------- Physical Exam BP 117/83 (BP Location: Left Arm, Patient Position: Sitting, Cuff Size: Small)   Pulse (!) 110   Wt 156 lb 9.6 oz (71 kg)   SpO2 99%   BMI 22.47 kg/m   Physical Exam Constitutional:      Appearance: Normal appearance.  HENT:     Head: Normocephalic and atraumatic.  Eyes:     General: No scleral icterus. Cardiovascular:     Rate and Rhythm: Normal rate and regular rhythm.  Pulmonary:     Effort: Pulmonary effort is normal.     Breath sounds: Normal breath sounds.   Musculoskeletal:     Cervical back: Neck supple.  Neurological:     General: No focal deficit present.     Mental Status: He is alert.  Psychiatric:        Mood and Affect: Mood normal.        Behavior: Behavior normal.     ------------------------------------------------------------------------------------------------------------------------------------------------------------------------------------------------------------------- Assessment and Plan  Anxiety Will add buspar on in  addition to effexor.  Referral placed to therapist.    MDD (major depressive disorder), recurrent episode, moderate (HCC) Effexor has been most effective medication.  Will see if better management of anxiety addition of therapy is helpful.   Excessive drinking alcohol Continued to counsel on depressant effects of alcohol and recommend continuing to cut back with goal of quitting.    Hypertension BP remains well controlled at this time.  Continue current anti-hypertensive medications.     Meds ordered this encounter  Medications  . busPIRone (BUSPAR) 10 MG tablet    Sig: Take 1 tablet (10 mg total) by mouth 2 (two) times daily.    Dispense:  60 tablet    Refill:  3    Return in about 3 months (around 02/02/2021) for Depression/anxiety.    This visit occurred during the SARS-CoV-2 public health emergency.  Safety protocols were in place, including screening questions prior to the visit, additional usage of staff PPE, and extensive cleaning of exam room while observing appropriate contact time as indicated for disinfecting solutions.

## 2020-11-11 ENCOUNTER — Encounter: Payer: Self-pay | Admitting: Family Medicine

## 2020-11-25 ENCOUNTER — Ambulatory Visit (INDEPENDENT_AMBULATORY_CARE_PROVIDER_SITE_OTHER): Payer: BC Managed Care – PPO | Admitting: Psychologist

## 2020-11-25 DIAGNOSIS — F411 Generalized anxiety disorder: Secondary | ICD-10-CM | POA: Diagnosis not present

## 2020-11-25 DIAGNOSIS — F320 Major depressive disorder, single episode, mild: Secondary | ICD-10-CM | POA: Diagnosis not present

## 2020-11-26 ENCOUNTER — Ambulatory Visit: Payer: 59 | Admitting: Family Medicine

## 2020-12-08 ENCOUNTER — Ambulatory Visit (INDEPENDENT_AMBULATORY_CARE_PROVIDER_SITE_OTHER): Payer: BC Managed Care – PPO | Admitting: Psychologist

## 2020-12-08 DIAGNOSIS — F320 Major depressive disorder, single episode, mild: Secondary | ICD-10-CM

## 2020-12-08 DIAGNOSIS — F411 Generalized anxiety disorder: Secondary | ICD-10-CM | POA: Diagnosis not present

## 2020-12-22 ENCOUNTER — Telehealth: Payer: Self-pay | Admitting: Cardiology

## 2020-12-22 ENCOUNTER — Ambulatory Visit: Payer: BC Managed Care – PPO | Admitting: Psychologist

## 2020-12-22 MED ORDER — LANSOPRAZOLE 30 MG PO CPDR: 30.0000 mg | DELAYED_RELEASE_CAPSULE | Freq: Every day | ORAL | 3 refills | Status: DC

## 2020-12-22 NOTE — Telephone Encounter (Signed)
Pt c/o medication issue:  1. Name of Medication: pantoprazole (PROTONIX) 40 MG tablet  2. How are you currently taking this medication (dosage and times per day)? 1 tablet a day  3. Are you having a reaction (difficulty breathing--STAT)? no  4. What is your medication issue? Patients is calling to see if he can take Omeprazole instead of the medication prescribe because of the cost. He states that Omeprazole is more affordable for him. And if not that he also ask bout Lansoprazole because both are cheaper then Pantoprazole. Asking for 90 day supply

## 2020-12-22 NOTE — Telephone Encounter (Signed)
With Barrett's esophagus and an esophageal stricture he should remain on a PPI. Lansoprazole, pantoprazole instead of omeprazole would be good alternative for him.

## 2020-12-22 NOTE — Telephone Encounter (Signed)
Lansoprazole seems to be better than omeprazole as far as the interaction with clopidogrel. However, I feel the safest option would be famotidine as there is no interaction with clopidogrel and PPI (lasnoprazole,omeprazole ext) are associated with poor long term outcomes (frature, Cdiff) than H2 blockers.   Dr. Fuller Plan are you ok with patient switching to famotidine instead?

## 2020-12-22 NOTE — Telephone Encounter (Signed)
Will change Rx to lansoprazole 30mg  daily. Rx sent to pharmacy. Karlene Einstein will you please let Mr. Paulos know that I ok the change with Dr. Fuller Plan.

## 2020-12-22 NOTE — Telephone Encounter (Signed)
Will forward this message to our Prior Auth Nurse and Pharmacy team, to see if the cost of this medication can be reduced with his insurance carrier, and if not, what alternative regimen should be advised for this pt. Triage to follow-up with the pt accordingly thereafter.

## 2020-12-22 NOTE — Telephone Encounter (Signed)
Left the pt a message to call the office back to endorse alternative medication change and plan per PharmD and Dr. Fuller Plan.

## 2020-12-24 NOTE — Telephone Encounter (Signed)
Patient is returning call.  °

## 2020-12-24 NOTE — Telephone Encounter (Signed)
RN called and stated to the patient that per Marcelle Overlie; lansoprazole 30mg  daily was sent to pharmacy and ok'd the change with Dr. Fuller Plan. Patient stated he was ok with the plan and didn't have any questions at this time.  Manuela Schwartz RN

## 2020-12-28 NOTE — Telephone Encounter (Signed)
Agree with plan Aqil Raif Chachere, MD  

## 2021-01-04 ENCOUNTER — Other Ambulatory Visit: Payer: Self-pay | Admitting: Family Medicine

## 2021-01-04 DIAGNOSIS — R7989 Other specified abnormal findings of blood chemistry: Secondary | ICD-10-CM

## 2021-01-05 ENCOUNTER — Other Ambulatory Visit: Payer: Self-pay | Admitting: *Deleted

## 2021-01-05 MED ORDER — LOSARTAN POTASSIUM 50 MG PO TABS: 50.0000 mg | ORAL_TABLET | Freq: Every day | ORAL | 0 refills | Status: DC

## 2021-01-25 ENCOUNTER — Other Ambulatory Visit: Payer: Self-pay

## 2021-01-25 ENCOUNTER — Other Ambulatory Visit: Payer: Self-pay | Admitting: Family Medicine

## 2021-01-25 MED ORDER — NITROGLYCERIN 0.4 MG SL SUBL: SUBLINGUAL_TABLET | SUBLINGUAL | 2 refills | Status: DC

## 2021-02-02 ENCOUNTER — Encounter: Payer: Self-pay | Admitting: Family Medicine

## 2021-02-02 ENCOUNTER — Ambulatory Visit (INDEPENDENT_AMBULATORY_CARE_PROVIDER_SITE_OTHER): Payer: BC Managed Care – PPO | Admitting: Family Medicine

## 2021-02-02 ENCOUNTER — Other Ambulatory Visit: Payer: Self-pay

## 2021-02-02 ENCOUNTER — Other Ambulatory Visit: Payer: Self-pay | Admitting: Cardiology

## 2021-02-02 DIAGNOSIS — F329 Major depressive disorder, single episode, unspecified: Secondary | ICD-10-CM | POA: Insufficient documentation

## 2021-02-02 DIAGNOSIS — F3341 Major depressive disorder, recurrent, in partial remission: Secondary | ICD-10-CM

## 2021-02-02 MED ORDER — VENLAFAXINE HCL ER 75 MG PO CP24: 225.0000 mg | ORAL_CAPSULE | Freq: Every day | ORAL | 0 refills | Status: DC

## 2021-02-02 NOTE — Assessment & Plan Note (Signed)
Continues to have depression and anxiety symptoms.  Will try maximizing effexor xr at 243m daily.  [facility] testing discussed and he would like to have this completed.  Will order testing kit.  Return in about 4 months (around 06/05/2021) for MDD.

## 2021-02-02 NOTE — Progress Notes (Signed)
Jeremiah Short - 60 y.o. male MRN [id number]  Date of birth: [birth year]  Subjective Chief Complaint  Patient presents with  . Depression    HPI Jeremiah Short is a 60 y.o. male here today for follow up of depression.  He has been on several medications, currently on effexor but still doesn't feel that this is working real well for him. Doing pretty well at first but effects have diminished at this time. Still with decreased motivation and low energy levels.  Addition of testosterone was not helpful and he had discontinued.    ROS:  A comprehensive ROS was completed and negative except as noted per HPI  Allergies  Allergen Reactions  . Morphine And Related Other (See Comments)    dont remember the symptoms    Past Medical History:  Diagnosis Date  . Anxiety   . Barrett's esophagus   . Cardiomyopathy in other diseases classified elsewhere   . Coronary artery disease   . Depression   . Diverticulosis   . Emphysema, unspecified ([place])   . Esophageal stricture   . GERD (gastroesophageal reflux disease)   . History of ETOH abuse   . Hypertension   . Myocardial infarction ([place]) 2009  . Pure hypercholesterolemia   . Smoker   . Stented coronary artery   . Tubular adenoma of colon 2012  . Wears glasses     Past Surgical History:  Procedure Laterality Date  . APPENDECTOMY  2011   during colectomy  . CARDIAC CATHETERIZATION  2009   placed 2 stents  . CARDIAC CATHETERIZATION N/A 09/27/2016   Procedure: Left Heart Cath and Coronary Angiography;  Surgeon: [name], MD;  Location: [facility];  Service: Cardiovascular;  Laterality: N/A;  . CARDIAC CATHETERIZATION N/A 09/27/2016   Procedure: Coronary Stent Intervention;  Surgeon: [name], MD;  Location: [place] [facility];  Service: Cardiovascular;  Laterality: N/A;  . CORONARY STENT PLACEMENT    . HERNIA REPAIR    . INGUINAL HERNIA REPAIR Left 09/16/2013   Procedure: HERNIA REPAIR INGUINAL ADULT;  Surgeon: Joyice Faster.  Cornett, MD;  Location: [facility];  Service: General;  Laterality: Left;  . MOUTH SURGERY    . PARTIAL COLECTOMY  2011   sigmoid  . TONSILLECTOMY    . WISDOM TOOTH EXTRACTION      Social History   Socioeconomic History  . Marital status: Divorced    Spouse name: Not on file  . Number of children: 2  . Years of education: Not on file  . Highest education level: Not on file  Occupational History  . Occupation: Glass blower/designer  Tobacco Use  . Smoking status: Current Every Day Smoker    Packs/day: 0.75    Years: 40.00    Pack years: 30.00    Types: E-cigarettes, Cigarettes  . Smokeless tobacco: Never Used  . Tobacco comment: Pt given handout to quit smoking  Vaping Use  . Vaping Use: Never used  Substance and Sexual Activity  . Alcohol use: Yes    Alcohol/week: 90.0 standard drinks    Types: 90 Shots of liquor per week    Comment: 1/5 vodka every 3 days (750 ml)  . Drug use: No  . Sexual activity: Not on file  Other Topics Concern  . Not on file  Social History Narrative  . Not on file   Social Determinants of Health   Financial Resource Strain: Not on file  Food Insecurity: Not on file  Transportation Needs:  Not on file  Physical Activity: Not on file  Stress: Not on file  Social Connections: Not on file    Family History  Problem Relation Age of Onset  . Lung cancer Mother   . Heart disease Father        has pacemaker  . Breast cancer Sister   . Colon cancer Neg Hx   . Colon polyps Neg Hx   . Esophageal cancer Neg Hx   . Kidney disease Neg Hx   . Diabetes Neg Hx   . Stomach cancer Neg Hx   . Rectal cancer Neg Hx     Health Maintenance  Topic Date Due  . COVID-19 Vaccine (4 - Booster for Pfizer series) 04/07/2021  . INFLUENZA VACCINE  05/02/2021  . COLONOSCOPY (Pts 45-42yr Insurance coverage will need to be confirmed)  09/24/2021  . TETANUS/TDAP  10/23/2028  . Hepatitis C Screening  Completed  . HPV VACCINES  Aged Out  . HIV  Screening  Discontinued     ----------------------------------------------------------------------------------------------------------------------------------------------------------------------------------------------------------------- Physical Exam BP 111/85 (BP Location: Left Arm, Patient Position: Sitting, Cuff Size: Small)   Pulse (!) 119   Ht '5\' 10"'  (1.778 m)   Wt 147 lb 1.6 oz (66.7 kg)   SpO2 96%   BMI 21.11 kg/m   Physical Exam Constitutional:      Appearance: Normal appearance.  Eyes:     General: No scleral icterus. Cardiovascular:     Rate and Rhythm: Normal rate and regular rhythm.  Pulmonary:     Effort: Pulmonary effort is normal.     Breath sounds: Normal breath sounds.  Musculoskeletal:     Cervical back: Neck supple.  Skin:    General: Skin is warm and dry.  Neurological:     General: No focal deficit present.     Mental Status: He is alert.  Psychiatric:        Mood and Affect: Mood normal.        Behavior: Behavior normal.     ------------------------------------------------------------------------------------------------------------------------------------------------------------------------------------------------------------------- Assessment and Plan  MDD (major depressive disorder) Continues to have depression and anxiety symptoms.  Will try maximizing effexor xr at 223mdaily.  [employer] testing discussed and he would like to have this completed.  Will order testing kit.  Return in about 4 months (around 06/05/2021) for MDD.    Meds ordered this encounter  Medications  . venlafaxine XR (EFFEXOR-XR) 75 MG 24 hr capsule    Sig: Take 3 capsules (225 mg total) by mouth daily with breakfast.    Dispense:  270 capsule    Refill:  0    Return in about 4 months (around 06/05/2021) for MDD.    This visit occurred during the SARS-CoV-2 public health emergency.  Safety protocols were in place, including screening questions prior to the visit,  additional usage of staff PPE, and extensive cleaning of exam room while observing appropriate contact time as indicated for disinfecting solutions.

## 2021-02-02 NOTE — Patient Instructions (Signed)
Increase effexor to 242m daily.  We'll let you know when we get a [employer] kit in.  See me again in about 4 months.

## 2021-02-23 ENCOUNTER — Encounter (HOSPITAL_BASED_OUTPATIENT_CLINIC_OR_DEPARTMENT_OTHER): Payer: Self-pay

## 2021-02-23 ENCOUNTER — Other Ambulatory Visit: Payer: Self-pay

## 2021-02-23 ENCOUNTER — Inpatient Hospital Stay (HOSPITAL_BASED_OUTPATIENT_CLINIC_OR_DEPARTMENT_OTHER)
Admission: EM | Admit: 2021-02-23 | Discharge: 2021-03-01 | DRG: 418 | Disposition: A | Discharge status: Home / Self Care | Payer: BC Managed Care – PPO | Attending: Family Medicine | Admitting: Family Medicine

## 2021-02-23 ENCOUNTER — Emergency Department (HOSPITAL_BASED_OUTPATIENT_CLINIC_OR_DEPARTMENT_OTHER): Payer: BC Managed Care – PPO

## 2021-02-23 DIAGNOSIS — K810 Acute cholecystitis: Secondary | ICD-10-CM | POA: Diagnosis not present

## 2021-02-23 DIAGNOSIS — K8012 Calculus of gallbladder with acute and chronic cholecystitis without obstruction: Secondary | ICD-10-CM | POA: Diagnosis not present

## 2021-02-23 DIAGNOSIS — Z955 Presence of coronary angioplasty implant and graft: Secondary | ICD-10-CM

## 2021-02-23 DIAGNOSIS — I25119 Atherosclerotic heart disease of native coronary artery with unspecified angina pectoris: Secondary | ICD-10-CM

## 2021-02-23 DIAGNOSIS — F1010 Alcohol abuse, uncomplicated: Secondary | ICD-10-CM | POA: Diagnosis not present

## 2021-02-23 DIAGNOSIS — K8000 Calculus of gallbladder with acute cholecystitis without obstruction: Principal | ICD-10-CM | POA: Diagnosis present

## 2021-02-23 DIAGNOSIS — J432 Centrilobular emphysema: Secondary | ICD-10-CM | POA: Diagnosis present

## 2021-02-23 DIAGNOSIS — K8020 Calculus of gallbladder without cholecystitis without obstruction: Secondary | ICD-10-CM | POA: Diagnosis not present

## 2021-02-23 DIAGNOSIS — K2270 Barrett's esophagus without dysplasia: Secondary | ICD-10-CM | POA: Diagnosis present

## 2021-02-23 DIAGNOSIS — E785 Hyperlipidemia, unspecified: Secondary | ICD-10-CM | POA: Diagnosis present

## 2021-02-23 DIAGNOSIS — F32A Depression, unspecified: Secondary | ICD-10-CM | POA: Diagnosis present

## 2021-02-23 DIAGNOSIS — Z7902 Long term (current) use of antithrombotics/antiplatelets: Secondary | ICD-10-CM

## 2021-02-23 DIAGNOSIS — I5042 Chronic combined systolic (congestive) and diastolic (congestive) heart failure: Secondary | ICD-10-CM | POA: Diagnosis not present

## 2021-02-23 DIAGNOSIS — Z716 Tobacco abuse counseling: Secondary | ICD-10-CM

## 2021-02-23 DIAGNOSIS — I110 Hypertensive heart disease with heart failure: Secondary | ICD-10-CM | POA: Diagnosis present

## 2021-02-23 DIAGNOSIS — I255 Ischemic cardiomyopathy: Secondary | ICD-10-CM | POA: Diagnosis not present

## 2021-02-23 DIAGNOSIS — F419 Anxiety disorder, unspecified: Secondary | ICD-10-CM | POA: Diagnosis not present

## 2021-02-23 DIAGNOSIS — F17290 Nicotine dependence, other tobacco product, uncomplicated: Secondary | ICD-10-CM | POA: Diagnosis not present

## 2021-02-23 DIAGNOSIS — I2510 Atherosclerotic heart disease of native coronary artery without angina pectoris: Secondary | ICD-10-CM

## 2021-02-23 DIAGNOSIS — Z885 Allergy status to narcotic agent status: Secondary | ICD-10-CM

## 2021-02-23 DIAGNOSIS — R0789 Other chest pain: Secondary | ICD-10-CM | POA: Diagnosis not present

## 2021-02-23 DIAGNOSIS — M62838 Other muscle spasm: Secondary | ICD-10-CM | POA: Diagnosis not present

## 2021-02-23 DIAGNOSIS — S32010A Wedge compression fracture of first lumbar vertebra, initial encounter for closed fracture: Secondary | ICD-10-CM | POA: Diagnosis not present

## 2021-02-23 DIAGNOSIS — K219 Gastro-esophageal reflux disease without esophagitis: Secondary | ICD-10-CM | POA: Diagnosis present

## 2021-02-23 DIAGNOSIS — I10 Essential (primary) hypertension: Secondary | ICD-10-CM

## 2021-02-23 DIAGNOSIS — Z20822 Contact with and (suspected) exposure to covid-19: Secondary | ICD-10-CM | POA: Diagnosis not present

## 2021-02-23 DIAGNOSIS — Z9049 Acquired absence of other specified parts of digestive tract: Secondary | ICD-10-CM

## 2021-02-23 DIAGNOSIS — K82A1 Gangrene of gallbladder in cholecystitis: Secondary | ICD-10-CM | POA: Diagnosis present

## 2021-02-23 DIAGNOSIS — I700 Atherosclerosis of aorta: Secondary | ICD-10-CM | POA: Diagnosis not present

## 2021-02-23 DIAGNOSIS — Z419 Encounter for procedure for purposes other than remedying health state, unspecified: Secondary | ICD-10-CM

## 2021-02-23 DIAGNOSIS — J439 Emphysema, unspecified: Secondary | ICD-10-CM | POA: Diagnosis not present

## 2021-02-23 DIAGNOSIS — Z79899 Other long term (current) drug therapy: Secondary | ICD-10-CM

## 2021-02-23 DIAGNOSIS — M4856XA Collapsed vertebra, not elsewhere classified, lumbar region, initial encounter for fracture: Secondary | ICD-10-CM | POA: Diagnosis not present

## 2021-02-23 DIAGNOSIS — I252 Old myocardial infarction: Secondary | ICD-10-CM

## 2021-02-23 DIAGNOSIS — Z7982 Long term (current) use of aspirin: Secondary | ICD-10-CM

## 2021-02-23 DIAGNOSIS — E7800 Pure hypercholesterolemia, unspecified: Secondary | ICD-10-CM | POA: Diagnosis present

## 2021-02-23 DIAGNOSIS — F1090 Alcohol use, unspecified, uncomplicated: Secondary | ICD-10-CM | POA: Diagnosis present

## 2021-02-23 DIAGNOSIS — Z8249 Family history of ischemic heart disease and other diseases of the circulatory system: Secondary | ICD-10-CM

## 2021-02-23 DIAGNOSIS — Z01810 Encounter for preprocedural cardiovascular examination: Secondary | ICD-10-CM | POA: Diagnosis not present

## 2021-02-23 LAB — CBC WITH DIFFERENTIAL/PLATELET
Abs Immature Granulocytes: 0.03 10*3/uL (ref 0.00–0.07)
Basophils Absolute: 0 10*3/uL (ref 0.0–0.1)
Basophils Relative: 1 %
Eosinophils Absolute: 0.1 10*3/uL (ref 0.0–0.5)
Eosinophils Relative: 1 %
HCT: 48.6 % (ref 39.0–52.0)
Hemoglobin: 16.8 g/dL (ref 13.0–17.0)
Immature Granulocytes: 0 %
Lymphocytes Relative: 18 %
Lymphs Abs: 1.5 10*3/uL (ref 0.7–4.0)
MCH: 34.3 pg — ABNORMAL HIGH (ref 26.0–34.0)
MCHC: 34.6 g/dL (ref 30.0–36.0)
MCV: 99.2 fL (ref 80.0–100.0)
Monocytes Absolute: 1 10*3/uL (ref 0.1–1.0)
Monocytes Relative: 12 %
Neutro Abs: 5.7 10*3/uL (ref 1.7–7.7)
Neutrophils Relative %: 68 %
Platelets: 162 10*3/uL (ref 150–400)
RBC: 4.9 MIL/uL (ref 4.22–5.81)
RDW: 13.3 % (ref 11.5–15.5)
WBC: 8.3 10*3/uL (ref 4.0–10.5)
nRBC: 0 % (ref 0.0–0.2)

## 2021-02-23 LAB — RESP PANEL BY RT-PCR (FLU A&B, COVID) ARPGX2
Influenza A by PCR: NEGATIVE
Influenza B by PCR: NEGATIVE
SARS Coronavirus 2 by RT PCR: NEGATIVE

## 2021-02-23 LAB — COMPREHENSIVE METABOLIC PANEL
ALT: 24 U/L (ref 0–44)
AST: 28 U/L (ref 15–41)
Albumin: 4.2 g/dL (ref 3.5–5.0)
Alkaline Phosphatase: 70 U/L (ref 38–126)
Anion gap: 11 (ref 5–15)
BUN: 13 mg/dL (ref 8–23)
CO2: 26 mmol/L (ref 22–32)
Calcium: 9.7 mg/dL (ref 8.9–10.3)
Chloride: 96 mmol/L — ABNORMAL LOW (ref 98–111)
Creatinine, Ser: 0.76 mg/dL (ref 0.61–1.24)
GFR, Estimated: >60 mL/min (ref >60)
Glucose, Bld: 155 mg/dL — ABNORMAL HIGH (ref 70–99)
Potassium: 3.7 mmol/L (ref 3.5–5.1)
Sodium: 133 mmol/L — ABNORMAL LOW (ref 135–145)
Total Bilirubin: 1.1 mg/dL (ref 0.3–1.2)
Total Protein: 8 g/dL (ref 6.5–8.1)

## 2021-02-23 LAB — URINALYSIS, ROUTINE W REFLEX MICROSCOPIC
Bilirubin Urine: NEGATIVE
Glucose, UA: NEGATIVE mg/dL
Ketones, ur: NEGATIVE mg/dL
Leukocytes,Ua: NEGATIVE
Nitrite: NEGATIVE
Protein, ur: NEGATIVE mg/dL
Specific Gravity, Urine: 1.025 (ref 1.005–1.030)
pH: 6 (ref 5.0–8.0)

## 2021-02-23 LAB — URINALYSIS, MICROSCOPIC (REFLEX): WBC, UA: NONE SEEN WBC/hpf (ref 0–5)

## 2021-02-23 LAB — LIPASE, BLOOD: Lipase: 25 U/L (ref 11–51)

## 2021-02-23 MED ORDER — FENTANYL CITRATE (PF) 100 MCG/2ML IJ SOLN
100.0000 ug | Freq: Once | INTRAMUSCULAR | Status: AC
  Administered 2021-02-23: 100 ug via INTRAVENOUS
  Filled 2021-02-23: qty 2

## 2021-02-23 MED ORDER — SODIUM CHLORIDE 0.9 % IV SOLN
1.0000 g | Freq: Once | INTRAVENOUS | Status: AC
  Administered 2021-02-23: 1 g via INTRAVENOUS
  Filled 2021-02-23: qty 10

## 2021-02-23 MED ORDER — FENTANYL CITRATE (PF) 100 MCG/2ML IJ SOLN
50.0000 ug | Freq: Once | INTRAMUSCULAR | Status: AC
  Administered 2021-02-23: 50 ug via INTRAVENOUS
  Filled 2021-02-23: qty 2

## 2021-02-23 MED ORDER — FENTANYL CITRATE (PF) 100 MCG/2ML IJ SOLN
50.0000 ug | Freq: Once | INTRAMUSCULAR | Status: AC
Start: 2021-02-23 — End: 2021-02-23
  Administered 2021-02-23: 50 ug via INTRAVENOUS
  Filled 2021-02-23: qty 2

## 2021-02-23 MED ORDER — ONDANSETRON HCL 4 MG/2ML IJ SOLN
4.0000 mg | Freq: Once | INTRAMUSCULAR | Status: AC
  Administered 2021-02-23: 4 mg via INTRAVENOUS
  Filled 2021-02-23: qty 2

## 2021-02-23 MED ORDER — NICOTINE 21 MG/24HR TD PT24
21.0000 mg | MEDICATED_PATCH | Freq: Once | TRANSDERMAL | Status: AC
  Administered 2021-02-23: 21 mg via TRANSDERMAL
  Filled 2021-02-23: qty 1

## 2021-02-23 NOTE — ED Provider Notes (Signed)
[facility] EMERGENCY DEPARTMENT Provider Note   CSN: [id number] Arrival date & time: 02/23/21  1616     History Chief Complaint  Patient presents with  . Abdominal Pain    Jeremiah Short is a 60 y.o. male with history of CAD status post PCI 2009 and December 2017, hypertension, third esophagus, GERD, depression, anxiety.  Presents with a chief complaint of abdominal pain.  Patient reports that his abdominal pain began approximately 0400/0500 this morning.  Patient reports that his pain woke him from sleep.  Patient's pain started the entire abdomen.  Pain is constant.  Patient rates pain 8/10 on the pain scale.  Patient describes his pain as dull.  Pain does not radiate anywhere.  Pain is worse when lying down.  Pain is improved when he is standing.  Patient has not eaten anything today.  Patient's last bowel movement occurred this morning.    Patient denies any fevers, chills, shortness of breath, chest pain, palpitations, abdominal distention, nausea, vomiting, diarrhea, constipation, blood in stool, melena, GU complaints, syncope, lightheadedness, dizziness, numbness to extremities, weakness to extremities.  Reports that he has had previous partial colectomy, appendectomy, and hernia repair.  HPI     Past Medical History:  Diagnosis Date  . Anxiety   . Barrett's esophagus   . Cardiomyopathy in other diseases classified elsewhere   . Coronary artery disease   . Depression   . Diverticulosis   . Emphysema, unspecified ([place])   . Esophageal stricture   . GERD (gastroesophageal reflux disease)   . History of ETOH abuse   . Hypertension   . Myocardial infarction ([place]) 2009  . Pure hypercholesterolemia   . Smoker   . Stented coronary artery   . Tubular adenoma of colon 2012  . Wears glasses     Patient Active Problem List   Diagnosis Date Noted  . MDD (major depressive disorder) 02/02/2021  . Excessive drinking alcohol 05/28/2020  . Calculus of gallbladder  without cholecystitis without obstruction 05/17/2020  . Barrett's esophagus without dysplasia 05/17/2020  . Macrocytic anemia 03/10/2020  . Decreased appetite 03/10/2020  . Fatigue 03/10/2020  . Sprain of acromioclavicular ligament 09/25/2019  . Low testosterone 04/30/2019  . MDD (major depressive disorder), recurrent episode, moderate ([place]) 12/17/2018  . Chronic bilateral low back pain without sciatica 06/17/2018  . Seborrheic keratosis 05/30/2018  . Well adult exam 05/06/2018  . Nicotine dependence, cigarettes, uncomplicated 32/95/1884  . Acute ST elevation myocardial infarction (STEMI) involving left anterior descending (LAD) coronary artery ([place]) 09/27/2016  . Coronary artery disease   . Hypertension   . Myocardial infarction ([place])   . Pure hypercholesterolemia   . Cardiomyopathy in other diseases classified elsewhere   . Anxiety     Past Surgical History:  Procedure Laterality Date  . APPENDECTOMY  2011   during colectomy  . CARDIAC CATHETERIZATION  2009   placed 2 stents  . CARDIAC CATHETERIZATION N/A 09/27/2016   Procedure: Left Heart Cath and Coronary Angiography;  Surgeon: [name], MD;  Location: [facility];  Service: Cardiovascular;  Laterality: N/A;  . CARDIAC CATHETERIZATION N/A 09/27/2016   Procedure: Coronary Stent Intervention;  Surgeon: [name], MD;  Location: [facility];  Service: Cardiovascular;  Laterality: N/A;  . CORONARY STENT PLACEMENT    . HERNIA REPAIR    . INGUINAL HERNIA REPAIR Left 09/16/2013   Procedure: HERNIA REPAIR INGUINAL ADULT;  Surgeon: [name], MD;  Location: [facility];  Service: General;  Laterality: Left;  . MOUTH SURGERY    . PARTIAL COLECTOMY  2011   sigmoid  . TONSILLECTOMY    . WISDOM TOOTH EXTRACTION         Family History  Problem Relation Age of Onset  . Lung cancer Mother   . Heart disease Father        has pacemaker  . Breast cancer Sister   . Colon cancer Neg Hx   . Colon  polyps Neg Hx   . Esophageal cancer Neg Hx   . Kidney disease Neg Hx   . Diabetes Neg Hx   . Stomach cancer Neg Hx   . Rectal cancer Neg Hx     Social History   Tobacco Use  . Smoking status: Current Every Day Smoker    Packs/day: 0.75    Years: 40.00    Pack years: 30.00    Types: E-cigarettes, Cigarettes  . Smokeless tobacco: Never Used  . Tobacco comment: Pt given handout to quit smoking  Vaping Use  . Vaping Use: Never used  Substance Use Topics  . Alcohol use: Yes    Alcohol/week: 90.0 standard drinks    Types: 90 Shots of liquor per week    Comment: 1/5 vodka every 3 days (750 ml)  . Drug use: No    Home Medications Prior to Admission medications   Medication Sig Start Date End Date Taking? Authorizing Provider  aspirin EC 81 MG EC tablet Take 1 tablet (81 mg total) by mouth daily. 09/30/16   Erlene Quan, PA-C  atorvastatin (LIPITOR) 80 MG tablet TAKE 1 TABLET (80 MG TOTAL) BY MOUTH DAILY AT 6 PM. 10/26/20   Luetta Nutting, DO  B-D HYPODERMIC NEEDLE 22GX1" 22G X 1" MISC SMARTSIG:1 Injection Every 2 Weeks 04/21/20   [provider]  busPIRone (BUSPAR) 10 MG tablet Take 1 tablet (10 mg total) by mouth 2 (two) times daily. 11/05/20   Luetta Nutting, DO  carvedilol (COREG) 6.25 MG tablet Take 1 tablet (6.25 mg total) by mouth 2 (two) times daily. 05/31/20   Burtis Junes, NP  clopidogrel (PLAVIX) 75 MG tablet TAKE 1 TABLET BY MOUTH DAILY. 08/23/20   Jerline Pain, MD  isosorbide mononitrate (IMDUR) 60 MG 24 hr tablet TAKE 1 TABLET BY MOUTH EVERY DAY 10/26/20   Jerline Pain, MD  lansoprazole (PREVACID) 30 MG capsule Take 1 capsule (30 mg total) by mouth daily before breakfast. 12/22/20   Jerline Pain, MD  losartan (COZAAR) 50 MG tablet Take 1 tablet (50 mg total) by mouth daily. Please make yearly appt with Dr. [name] for August 2022 for future refills. Thank you 1st attempt 02/02/21   Jerline Pain, MD  Multiple Vitamin (MULTIVITAMIN) capsule Take 1 capsule by  mouth daily.    [provider]  nitroGLYCERIN (NITROSTAT) 0.4 MG SL tablet PLACE 1 TABLET UNDER THE TONGUE EVERY 5 MINUTES FOR 3 DOSES AS NEEDED FOR CHEST PAIN 01/25/21   Jerline Pain, MD  venlafaxine XR (EFFEXOR-XR) 75 MG 24 hr capsule Take 3 capsules (225 mg total) by mouth daily with breakfast. 02/02/21 05/03/21  Luetta Nutting, DO    Allergies    Morphine and related  Review of Systems   Review of Systems  Constitutional: Negative for chills and fever.  Eyes: Negative for visual disturbance.  Respiratory: Negative for shortness of breath.   Cardiovascular: Negative for chest pain and palpitations.  Gastrointestinal: Positive for abdominal pain. Negative for abdominal distention, anal bleeding,  blood in stool, constipation, diarrhea, nausea, rectal pain and vomiting.  Genitourinary: Negative for decreased urine volume, difficulty urinating, dysuria, frequency, hematuria, penile discharge, penile pain, penile swelling, scrotal swelling, testicular pain and urgency.  Musculoskeletal: Negative for back pain and neck pain.  Skin: Negative for color change and rash.  Neurological: Negative for dizziness, syncope, weakness, light-headedness, numbness and headaches.  Psychiatric/Behavioral: Negative for confusion.    Physical Exam Updated Vital Signs BP (!) 123/97 (BP Location: Left Arm)   Pulse 62   Temp 98.3 F (36.8 C) (Oral)   Resp 18   Ht 5\' 10"  (1.778 m)   Wt 68 kg   SpO2 100%   BMI 21.52 kg/m   Physical Exam Vitals and nursing note reviewed.  Constitutional:      General: He is not in acute distress.    Appearance: He is not ill-appearing, toxic-appearing or diaphoretic.  HENT:     Head: Normocephalic.  Eyes:     General: No scleral icterus.       Right eye: No discharge.        Left eye: No discharge.  Cardiovascular:     Rate and Rhythm: Normal rate.     Pulses:          Radial pulses are 3+ on the right side and 3+ on the left side.       Femoral pulses  are 2+ on the right side and 3+ on the left side.    Heart sounds: Normal heart sounds.  Pulmonary:     Effort: Pulmonary effort is normal. No tachypnea, bradypnea or respiratory distress.     Breath sounds: Normal breath sounds.     Comments: Lungs clear to auscultation bilaterally, patient able speak in full recesses without difficulty Abdominal:     General: Abdomen is flat. Bowel sounds are normal. There is no distension. There are no signs of injury.     Palpations: Abdomen is soft. There is no mass or pulsatile mass.     Tenderness: There is generalized abdominal tenderness and tenderness in the right upper quadrant. There is no guarding or rebound. Negative signs include Murphy's sign.     Hernia: There is no hernia in the umbilical area or ventral area.     Comments: Patient has tenderness throughout abdomen, tenderness is worse to right upper quadrant  Musculoskeletal:     Cervical back: Neck supple.  Skin:    General: Skin is warm and dry.     Coloration: Skin is not jaundiced or pale.  Neurological:     General: No focal deficit present.     Mental Status: He is alert.  Psychiatric:        Behavior: Behavior is cooperative.     ED Results / Procedures / Treatments   Labs (all labs ordered are listed, but only abnormal results are displayed) Labs Reviewed  COMPREHENSIVE METABOLIC PANEL - Abnormal; Notable for the following components:      Result Value   Sodium 133 (*)    Chloride 96 (*)    Glucose, Bld 155 (*)    All other components within normal limits  URINALYSIS, ROUTINE W REFLEX MICROSCOPIC - Abnormal; Notable for the following components:   Hgb urine dipstick SMALL (*)    All other components within normal limits  CBC WITH DIFFERENTIAL/PLATELET - Abnormal; Notable for the following components:   MCH 34.3 (*)    All other components within normal limits  URINALYSIS, MICROSCOPIC (REFLEX) - Abnormal; Notable  for the following components:   Bacteria, UA RARE (*)     All other components within normal limits  RESP PANEL BY RT-PCR (FLU A&B, COVID) ARPGX2  LIPASE, BLOOD    EKG None  Radiology CT Abdomen Pelvis Wo Contrast  Result Date: 02/23/2021 CLINICAL DATA:  Bilateral lower quadrant abdominal pain. History of diverticulitis with prior repair. EXAM: CT ABDOMEN AND PELVIS WITHOUT CONTRAST TECHNIQUE: Multidetector CT imaging of the abdomen and pelvis was performed following the standard protocol without IV contrast. COMPARISON:  05/10/2020 abdominal ultrasound. Most recent CT of 10/07/2014 FINDINGS: Lower chest: Mild centrilobular emphysema with bibasilar nonspecific interstitial thickening. Normal heart size without pericardial or pleural effusion. Distal esophageal wall thickening is again identified, mild-to-moderate on 02/03. Hepatobiliary: Normal noncontrast appearance of the liver. A gallstone of 1.8 cm. Pericholecystic edema is mild to moderate on 26/3. Mild gallbladder wall thickening at up to 5 mm. No biliary duct dilatation. Pancreas: Mild pancreatic atrophy, without duct dilatation or acute inflammation. Spleen: Normal in size, without focal abnormality. Adrenals/Urinary Tract: Normal adrenal glands. Mild renal cortical thinning bilaterally. No renal calculi or hydronephrosis. No bladder calculi. Stomach/Bowel: Normal stomach, without wall thickening. Descending duodenal diverticulum. Otherwise normal small bowel. Extensive colonic diverticulosis. Vascular/Lymphatic: Aortic atherosclerosis. No abdominopelvic adenopathy. Reproductive: Normal prostate. Other: No significant free fluid. No free intraperitoneal air. Fat containing diminutive ventral abdominal wall hernia. Musculoskeletal: Degenerate disc disease at the lumbosacral junction. Superior endplate compression deformity at L1 results in approximately 30% vertebral body height loss anteriorly. No ventral canal encroachment. New since 10/17/2014. IMPRESSION: 1. Cholelithiasis with gallbladder wall  thickening and pericholecystic edema, highly suspicious for acute cholecystitis. 2. No other explanation for patient's symptoms. 3. Distal esophageal wall thickening, suggesting esophagitis. 4. L1 compression deformity, new since 2016. 5. Aortic Atherosclerosis (ICD10-I70.0) and Emphysema (ICD10-J43.9). Electronically Signed   By: Abigail Miyamoto M.D.   On: 02/23/2021 18:25   US Abdomen Limited  Result Date: 02/23/2021 CLINICAL DATA:  Right upper quadrant pain EXAM: ULTRASOUND ABDOMEN LIMITED RIGHT UPPER QUADRANT COMPARISON:  Ultrasound 05/10/2020, CT 02/23/2021 FINDINGS: Gallbladder: Gallbladder contains multiple echogenic, shadowing gallstones and tumefactive appearing adherent biliary sludge. Largest gallstone measures up to 2.5 cm in diameter. Markedly thickened gallbladder wall with edematous changes and some trace fluid gallbladder fossa. Sonographic Percell Miller sign is reportedly negative. Common bile duct: Diameter: 5 mm, nondilated Liver: Mild heterogeneity of hepatic parenchyma. Smooth surface contour. No concerning focal liver lesion. Portal vein is patent on color Doppler imaging with normal direction of blood flow towards the liver. Other: None. IMPRESSION: Mild hepatic heterogeneity, nonspecific but can be seen in the setting of intrinsic liver disease. Gallbladder containing admixed shadowing gallstones and biliary sludge. Marked gallbladder wall thickening and pericholecystic fluid. While sonographic Percell Miller sign is reportedly negative, this may be spuriously absent due to analgesic medications noted in patient's chart. Particularly given the CT appearance, findings remain suspicious for an acute cholecystitis though some degree of gallbladder wall thickening may be present in the setting of intrinsic liver disease. Electronically Signed   By: Lovena Le M.D.   On: 02/23/2021 19:58    Procedures Procedures   Medications Ordered in ED Medications  cefTRIAXone (ROCEPHIN) 1 g in sodium chloride 0.9  % 100 mL IVPB (has no administration in time range)  nicotine (NICODERM CQ - dosed in mg/24 hours) patch 21 mg (has no administration in time range)  fentaNYL (SUBLIMAZE) injection 100 mcg (100 mcg Intravenous Given 02/23/21 1755)  ondansetron (ZOFRAN) injection 4 mg (4 mg Intravenous Given  02/23/21 1755)  fentaNYL (SUBLIMAZE) injection 50 mcg (50 mcg Intravenous Given 02/23/21 2009)    ED Course  I have reviewed the triage vital signs and the nursing notes.  Pertinent labs & imaging results that were available during my care of the patient were reviewed by me and considered in my medical decision making (see chart for details).  Clinical Course as of 02/24/21 0352  Wed Feb 23, 2021  2024 Spoke with Dr. Marlou Starks who advise general surgery will consult on this patient.  Request that patient be admitted to hospitalist service. [PB]  2038 Spoke with hospitalist Dr. Hal Hope who agreed to accept patient for transfer. [PB]    Clinical Course User Index [PB] Dyann Ruddle   MDM Rules/Calculators/A&P                          Alert 60 year old male no acute distress, nontoxic-appearing.  Patient presents with chief complaint of generalized abdominal pain.  Patient denies any fevers, chills, shortness of breath, chest pain, palpitations, abdominal distention, nausea, vomiting, diarrhea, constipation, blood in stool, melena, GU complaints, syncope, lightheadedness, dizziness, numbness to extremities, weakness to extremities.  On physical exam abdomen soft generalized tenderness throughout abdomen tenderness is worse to right upper quadrant.  Negative Murphy sign.  No guarding, rebound tenderness, mass, or pulsatile mass. +3 radial pulse bilaterally.  Patient has left femoral pulse, +2 right femoral pulse.   Patient is afebrile dynamically stable.    CMP, CBC, lipase, urinalysis, contrast CT abdomen pelvis.  Noncontrast CT abdomen pelvis was obtained over IV contrast due to national  contrast dye shortage.  CMP shows sodium decreased at 133 and chloride decreased at 96. CMP is unremarkable. Urinalysis shows bacteria rare, leukocytes negative, nitrate negative, RBC 6-10.  Low suspicion for urinary tract infection as patient has no urinary symptoms. CT abdomen pelvis shows lithiasis with gallbladder wall thickening in pericholecystic edema, highly suspicious for acute cholecystitis.  Due to findings on CT scan we will obtain right upper quadrant abdominal ultrasound. Ultrasound showed gallbladder containing Magick shadowing gallstones and biliary sludge.  Marked gallbladder wall thickening and pericholecystic fluid.  Will consult general surgery. 2024 spoke with Dr. Marlou Starks who advised general surgery will consult on patient.  Requested patient be admitted to hospitalist service.  Patient started on ceftriaxone for cholecystitis.  Spoke to Dr. Hal Hope who agreed to accept patient for transfer.   Final Clinical Impression(s) / ED Diagnoses Final diagnoses:  Cholecystitis, acute    Rx / DC Orders ED Discharge Orders    None       Loni Beckwith, PA-C 02/24/21 0352    Varney Biles, MD 02/25/21 [phone]

## 2021-02-23 NOTE — ED Notes (Signed)
Patient transported to CT 

## 2021-02-23 NOTE — ED Triage Notes (Signed)
Pt c/o abd pain started this am-denies n/v/d-NAD-steady gait

## 2021-02-23 NOTE — ED Notes (Signed)
ED Provider at bedside. 

## 2021-02-24 ENCOUNTER — Encounter (HOSPITAL_COMMUNITY): Payer: Self-pay | Admitting: Internal Medicine

## 2021-02-24 DIAGNOSIS — K810 Acute cholecystitis: Secondary | ICD-10-CM

## 2021-02-24 DIAGNOSIS — Z01810 Encounter for preprocedural cardiovascular examination: Secondary | ICD-10-CM

## 2021-02-24 DIAGNOSIS — I25119 Atherosclerotic heart disease of native coronary artery with unspecified angina pectoris: Secondary | ICD-10-CM

## 2021-02-24 DIAGNOSIS — I255 Ischemic cardiomyopathy: Secondary | ICD-10-CM | POA: Diagnosis not present

## 2021-02-24 DIAGNOSIS — J439 Emphysema, unspecified: Secondary | ICD-10-CM

## 2021-02-24 DIAGNOSIS — I2510 Atherosclerotic heart disease of native coronary artery without angina pectoris: Secondary | ICD-10-CM

## 2021-02-24 DIAGNOSIS — I700 Atherosclerosis of aorta: Secondary | ICD-10-CM

## 2021-02-24 DIAGNOSIS — S32010A Wedge compression fracture of first lumbar vertebra, initial encounter for closed fracture: Secondary | ICD-10-CM

## 2021-02-24 DIAGNOSIS — I5042 Chronic combined systolic (congestive) and diastolic (congestive) heart failure: Secondary | ICD-10-CM

## 2021-02-24 DIAGNOSIS — R6510 Systemic inflammatory response syndrome (SIRS) of non-infectious origin without acute organ dysfunction: Secondary | ICD-10-CM | POA: Insufficient documentation

## 2021-02-24 HISTORY — DX: Wedge compression fracture of first lumbar vertebra, initial encounter for closed fracture: S32.010A

## 2021-02-24 HISTORY — DX: Emphysema, unspecified: J43.9

## 2021-02-24 HISTORY — DX: Atherosclerosis of aorta: I70.0

## 2021-02-24 LAB — COMPREHENSIVE METABOLIC PANEL
ALT: 19 U/L (ref 0–44)
AST: 19 U/L (ref 15–41)
Albumin: 3.8 g/dL (ref 3.5–5.0)
Alkaline Phosphatase: 63 U/L (ref 38–126)
Anion gap: 13 (ref 5–15)
BUN: 14 mg/dL (ref 8–23)
CO2: 23 mmol/L (ref 22–32)
Calcium: 9.3 mg/dL (ref 8.9–10.3)
Chloride: 100 mmol/L (ref 98–111)
Creatinine, Ser: 0.6 mg/dL — ABNORMAL LOW (ref 0.61–1.24)
GFR, Estimated: >60 mL/min (ref >60)
Glucose, Bld: 119 mg/dL — ABNORMAL HIGH (ref 70–99)
Potassium: 3.8 mmol/L (ref 3.5–5.1)
Sodium: 136 mmol/L (ref 135–145)
Total Bilirubin: 0.8 mg/dL (ref 0.3–1.2)
Total Protein: 7.4 g/dL (ref 6.5–8.1)

## 2021-02-24 LAB — CBC
HCT: 47.9 % (ref 39.0–52.0)
Hemoglobin: 16.2 g/dL (ref 13.0–17.0)
MCH: 33.6 pg (ref 26.0–34.0)
MCHC: 33.8 g/dL (ref 30.0–36.0)
MCV: 99.4 fL (ref 80.0–100.0)
Platelets: 140 10*3/uL — ABNORMAL LOW (ref 150–400)
RBC: 4.82 MIL/uL (ref 4.22–5.81)
RDW: 13.4 % (ref 11.5–15.5)
WBC: 8.1 10*3/uL (ref 4.0–10.5)
nRBC: 0 % (ref 0.0–0.2)

## 2021-02-24 LAB — MRSA PCR SCREENING: MRSA by PCR: NEGATIVE

## 2021-02-24 LAB — MAGNESIUM: Magnesium: 1.8 mg/dL (ref 1.7–2.4)

## 2021-02-24 LAB — PHOSPHORUS: Phosphorus: 3.8 mg/dL (ref 2.5–4.6)

## 2021-02-24 LAB — TROPONIN I (HIGH SENSITIVITY)
Troponin I (High Sensitivity): 10 ng/L (ref <18)
Troponin I (High Sensitivity): 9 ng/L (ref <18)

## 2021-02-24 LAB — HIV ANTIBODY (ROUTINE TESTING W REFLEX): HIV Screen 4th Generation wRfx: NONREACTIVE

## 2021-02-24 MED ORDER — ENOXAPARIN SODIUM 40 MG/0.4ML IJ SOSY
40.0000 mg | PREFILLED_SYRINGE | INTRAMUSCULAR | Status: DC
  Filled 2021-02-24: qty 0.4

## 2021-02-24 MED ORDER — ACETAMINOPHEN 325 MG PO TABS
650.0000 mg | ORAL_TABLET | Freq: Four times a day (QID) | ORAL | Status: DC
  Administered 2021-02-24 – 2021-03-01 (×16): 650 mg via ORAL
  Filled 2021-02-24 (×18): qty 2

## 2021-02-24 MED ORDER — ACETAMINOPHEN 325 MG PO TABS: 650.0000 mg | ORAL_TABLET | Freq: Four times a day (QID) | ORAL | Status: DC | PRN

## 2021-02-24 MED ORDER — THIAMINE HCL 100 MG/ML IJ SOLN: 100.0000 mg | Freq: Every day | INTRAMUSCULAR | Status: DC

## 2021-02-24 MED ORDER — HYDROMORPHONE HCL 1 MG/ML IJ SOLN
0.5000 mg | INTRAMUSCULAR | Status: DC | PRN
  Administered 2021-02-24 – 2021-02-27 (×2): 0.5 mg via INTRAVENOUS
  Filled 2021-02-24 (×2): qty 0.5

## 2021-02-24 MED ORDER — LABETALOL HCL 5 MG/ML IV SOLN
10.0000 mg | INTRAVENOUS | Status: DC | PRN
  Administered 2021-02-27: 10 mg via INTRAVENOUS
  Filled 2021-02-24 (×3): qty 4

## 2021-02-24 MED ORDER — FENTANYL CITRATE (PF) 100 MCG/2ML IJ SOLN
25.0000 ug | INTRAMUSCULAR | Status: DC | PRN
  Administered 2021-02-24 (×4): 25 ug via INTRAVENOUS
  Filled 2021-02-24 (×4): qty 2

## 2021-02-24 MED ORDER — ASPIRIN EC 81 MG PO TBEC
81.0000 mg | DELAYED_RELEASE_TABLET | Freq: Every day | ORAL | Status: DC
  Administered 2021-02-24 – 2021-03-01 (×5): 81 mg via ORAL
  Filled 2021-02-24 (×5): qty 1

## 2021-02-24 MED ORDER — THIAMINE HCL 100 MG PO TABS
100.0000 mg | ORAL_TABLET | Freq: Every day | ORAL | Status: DC
  Administered 2021-02-24 – 2021-03-01 (×5): 100 mg via ORAL
  Filled 2021-02-24 (×6): qty 1

## 2021-02-24 MED ORDER — METHOCARBAMOL 500 MG PO TABS
500.0000 mg | ORAL_TABLET | Freq: Four times a day (QID) | ORAL | Status: DC | PRN
  Administered 2021-02-24 – 2021-02-28 (×6): 500 mg via ORAL
  Filled 2021-02-24 (×6): qty 1

## 2021-02-24 MED ORDER — ACETAMINOPHEN 650 MG RE SUPP: 650.0000 mg | Freq: Four times a day (QID) | RECTAL | Status: DC | PRN

## 2021-02-24 MED ORDER — LORAZEPAM 2 MG/ML IJ SOLN
1.0000 mg | INTRAMUSCULAR | Status: AC | PRN
Start: 2021-02-24 — End: 2021-02-27

## 2021-02-24 MED ORDER — LORAZEPAM 1 MG PO TABS
1.0000 mg | ORAL_TABLET | ORAL | Status: AC | PRN
  Administered 2021-02-26: 1 mg via ORAL
  Filled 2021-02-24: qty 1

## 2021-02-24 MED ORDER — NICOTINE 14 MG/24HR TD PT24
14.0000 mg | MEDICATED_PATCH | Freq: Every day | TRANSDERMAL | Status: DC
  Administered 2021-02-25 – 2021-02-28 (×4): 14 mg via TRANSDERMAL
  Filled 2021-02-24 (×5): qty 1

## 2021-02-24 MED ORDER — OXYCODONE HCL 5 MG PO TABS
5.0000 mg | ORAL_TABLET | ORAL | Status: DC | PRN
  Administered 2021-02-24 – 2021-02-25 (×7): 10 mg via ORAL
  Administered 2021-02-26 (×2): 5 mg via ORAL
  Administered 2021-02-27 – 2021-03-01 (×4): 10 mg via ORAL
  Filled 2021-02-24 (×12): qty 2
  Filled 2021-02-24 (×2): qty 1

## 2021-02-24 MED ORDER — SODIUM CHLORIDE 0.9 % IV SOLN
2.0000 g | INTRAVENOUS | Status: DC
  Administered 2021-02-24 – 2021-02-28 (×5): 2 g via INTRAVENOUS
  Filled 2021-02-24: qty 2
  Filled 2021-02-24: qty 20
  Filled 2021-02-24 (×3): qty 2

## 2021-02-24 MED ORDER — FOLIC ACID 1 MG PO TABS
1.0000 mg | ORAL_TABLET | Freq: Every day | ORAL | Status: DC
  Administered 2021-02-24 – 2021-03-01 (×5): 1 mg via ORAL
  Filled 2021-02-24 (×6): qty 1

## 2021-02-24 MED ORDER — ADULT MULTIVITAMIN W/MINERALS CH
1.0000 | ORAL_TABLET | Freq: Every day | ORAL | Status: DC
  Administered 2021-02-24 – 2021-03-01 (×5): 1 via ORAL
  Filled 2021-02-24 (×6): qty 1

## 2021-02-24 MED ORDER — METRONIDAZOLE 500 MG/100ML IV SOLN
500.0000 mg | Freq: Three times a day (TID) | INTRAVENOUS | Status: DC
  Administered 2021-02-24 – 2021-03-01 (×16): 500 mg via INTRAVENOUS
  Filled 2021-02-24 (×16): qty 100

## 2021-02-24 MED ORDER — CARVEDILOL 3.125 MG PO TABS
3.1250 mg | ORAL_TABLET | Freq: Two times a day (BID) | ORAL | Status: DC
  Administered 2021-02-24 – 2021-03-01 (×6): 3.125 mg via ORAL
  Filled 2021-02-24 (×8): qty 1

## 2021-02-24 MED ORDER — LOSARTAN POTASSIUM 50 MG PO TABS
50.0000 mg | ORAL_TABLET | Freq: Every day | ORAL | Status: DC
  Administered 2021-02-24 – 2021-03-01 (×5): 50 mg via ORAL
  Filled 2021-02-24 (×5): qty 1

## 2021-02-24 MED ORDER — SODIUM CHLORIDE 0.9 % IV SOLN
1.0000 g | Freq: Once | INTRAVENOUS | Status: AC
  Administered 2021-02-24: 1 g via INTRAVENOUS
  Filled 2021-02-24: qty 1

## 2021-02-24 MED ORDER — ATORVASTATIN CALCIUM 40 MG PO TABS
80.0000 mg | ORAL_TABLET | Freq: Every day | ORAL | Status: DC
  Administered 2021-02-24 – 2021-02-28 (×5): 80 mg via ORAL
  Filled 2021-02-24 (×5): qty 2

## 2021-02-24 MED ORDER — SODIUM CHLORIDE 0.9 % IV SOLN: INTRAVENOUS | Status: AC

## 2021-02-24 NOTE — Hospital Course (Addendum)
60 year old man PMH including CAD, CHF, Barrett's esophagus, multiple abdominal surgeries presented with abdominal pain generalized and right upper quadrant.  Admitted for acute cholecystitis.  Hospitalization prolonged by need to wait for Plavix to washout.  Status post subtotal cholecystectomy 5/29.  A & P  Acute calculus cholecystitis, status post subtotal cholecystectomy 5/29 with drain placement. -- Better today although still having pain.  Surgery anticipates discharge home tomorrow.  Drain output and consistency.  High risk for bile leak.  Continue antibiotics until discharge.  CAD status post PCI.  Denies chest pain now although was noted in H&P.Troponins negative.  EKG nonacute. --No further cardiac work-up indicated per cardiology. -- Continue aspirin.  Resume Plavix when cleared by surgery.  Chronic combined systolic and diastolic CHF, ischemic cardiomyopathy.  April 2018 LVEF 25-30%. -- Remains stable. --Continue losartan and carvedilol.  Consider transition to [facility] as an outpatient.  Essential hypertension -- Stable.  Continue carvedilol and losartan.  Hyperlipidemia --Continue statin.  Alcohol use --No signs of withdrawal.  Continue alcohol withdrawal protocol.  GERD, Barrett's esophagus --PPI  L1 compression deformity indeterminate age new since 2016.  Patient asymptomatic. --Follow-up with PCP as an outpatient, consider further evaluation   Aortic atherosclerosis --Continue statin  Emphysema --Quiescent.  Recommend stop smoking.

## 2021-02-24 NOTE — Progress Notes (Signed)
PROGRESS NOTE  Jeremiah Short PZW:[id number] DOB: [birth year] DOA: 02/23/2021 PCP: Luetta Nutting, DO  Brief History   59 year old man PMH including CAD, CHF, Barrett's esophagus, multiple abdominal surgeries presented with abdominal pain generalized and right upper quadrant.  Admitted for acute cholecystitis.  A & P  Acute calculus cholecystitis --Currently afebrile, vital stable.  Continue ceftriaxone and Flagyl. --Plavix and aspirin on hold pending surgery. --Operative management per surgery, divided by Plavix.  CAD status post PCI.  Denies chest pain now although was noted in H&P. --Asymptomatic.  Troponins negative.  EKG nonacute. --No further cardiac work-up indicated per cardiology. --Resume aspirin and Plavix when able.  Chronic combined systolic and diastolic CHF, ischemic cardiomyopathy.  April 2018 LVEF 25-30%. --Appears euvolemic. --Continue losartan and carvedilol.  Consider transition to [facility] as an outpatient.  Essential hypertension --Stable.  Continue carvedilol and losartan.  Hyperlipidemia --Continue statin.  Alcohol use --No signs of withdrawal.  Continue alcohol withdrawal protocol.  GERD, Barrett's esophagus --PPI  L1 compression deformity indeterminate age new since 2016.  Patient asymptomatic. --Follow-up with PCP as an outpatient, consider further evaluation   Aortic atherosclerosis --Continue statin  Emphysema --Quiescent.  Recommend stop smoking.  Disposition Plan:  Discussion:   Status is: Inpatient  Remains inpatient appropriate because:IV treatments appropriate due to intensity of illness or inability to take PO and Inpatient level of care appropriate due to severity of illness   Dispo: The patient is from: Home              Anticipated d/c is to: Home              Patient currently is not medically stable to d/c.   Difficult to place patient No  DVT prophylaxis: SCDs Start: 02/24/21 1131 SCDs Start: 02/24/21 0034   Code Status:  Full Code Level of care: Med-Surg Family Communication: none  Murray Hodgkins, MD  [facility] Direct contact: see [email or web address] (further directions at bottom of note if needed) 7PM-7AM contact night coverage as at bottom of note 02/24/2021, 3:26 PM  LOS: 1 day   Significant Hospital Events   . 5/25 admit for acute cholecystitis   Consults:  . General surgery  . Cardiology    Procedures:  .   Significant Diagnostic Tests:  [name]    Micro Data:  .    Antimicrobials:  . Ceftriaxone and Flagyl 5/25 >   Interval History/Subjective  CC: f/u abd pain  Feels okay right now, but there is a pain under his right rib cage whenever he moves.  No chest pain or shortness of breath.  No recent angina or cardiac symptoms.  No shortness of breath.  Objective   Vitals:  Vitals:   02/24/21 0858 02/24/21 1259  BP: (!) 142/104 (!) 133/98  Pulse: (!) 55 (!) 56  Resp: 18 18  Temp: 97.6 F (36.4 C) 98.2 F (36.8 C)  SpO2: 98% 95%    Exam:  Constitutional:   . Appears calm and comfortable ENMT:  . grossly normal hearing  Respiratory:  . CTA bilaterally, no w/r/r.  . Respiratory effort normal.  Cardiovascular:  . RRR, no m/r/g . No LE extremity edema   Abdomen:  . Soft ntnd Psychiatric:  . Mental status o Mood, affect appropriate  I have personally reviewed the following:   Today's Data  . Complete metabolic panel unremarkable . Platelets slightly lower 140  Scheduled Meds: . acetaminophen  650 mg Oral Q6H  . aspirin EC  81 mg Oral Daily  .  atorvastatin  80 mg Oral q1800  . carvedilol  3.125 mg Oral BID WC  . folic acid  1 mg Oral Daily  . losartan  50 mg Oral Daily  . multivitamin with minerals  1 tablet Oral Daily  . nicotine  14 mg Transdermal QHS  . nicotine  21 mg Transdermal Once  . thiamine  100 mg Oral Daily   Or  . thiamine  100 mg Intravenous Daily   Continuous Infusions: . [START ON 02/25/2021] cefTRIAXone (ROCEPHIN)  IV    . metronidazole 500 mg  (02/24/21 0813)    Principal Problem:   Acute cholecystitis Active Problems:   Benign essential HTN   Pure hypercholesterolemia   Excessive drinking alcohol   CAD (coronary artery disease)   Chronic combined systolic (congestive) and diastolic (congestive) heart failure (HCC)   Aortic atherosclerosis (HCC)   Emphysema lung (HCC)   Closed compression fracture of body of L1 vertebra ([place])   LOS: 1 day   How to contact the [facility] Attending or Consulting provider 7A - 7P or covering provider during after hours [place], for this patient?  1. Check the care team in [facility] and look for a) attending/consulting [facility] provider listed and b) the [facility] team listed 2. Log into [email or web address] and use [facility]'s universal password to access. If you do not have the password, please contact the hospital operator. 3. Locate the [facility] provider you are looking for under [facility] and page to a number that you can be directly reached. 4. If you still have difficulty reaching the provider, please page the [facility] (Director on Call) for the Hospitalists listed on amion for assistance.

## 2021-02-24 NOTE — Consult Note (Addendum)
Cardiology Consultation:   Patient ID: Jeremiah Short; [id number]; 07-11-1961   Admit date: 02/23/2021 Date of Consult: 02/24/2021  Primary Care Provider: Luetta Nutting, DO Primary Cardiologist: Dr. Candee Furbish, MD   Patient Profile:   Jeremiah Short is a 60 y.o. male with a hx of CAD with prior PCI to the LAD in the setting of STEMI in 2009, recurrent STEMI 09/2016 with jailed Diagonal 2 which was treated with angioplasty on indefinite DAPT. Also with a hx of mild LV dysfunction at 40-45%, tobacco use, HLD,  HTN, ETOH, GERD and Barretts esophagus who is being seen today for preoperative evaluation at the request of Dr. Marlowe Short.  History of Present Illness:   Jeremiah Short is a 60yo M with a hx as stated above who presented to [facility] on 02/23/21 with a one day hx of acute abdominal Short. He reports that he was in his usual state of health prior to this event. He states that he woke on 02/23/21 early morning with acute abdominal Short. Initial Short was generalized however has since localized to his right LQ. He had no associated SOB, diaphoresis, N/V, fever, palpitations or syncope. He reports he had some chest discomfort however feels this was more musculoskeletal due to the abdominal Short with no recurrence since his admission. Given his symptoms, he proceeded to the ED for further evaluation.   On ED evaluation he was found to be tachycardic but afebrile. Abdominal CT obtained which was concerning for acute cholecystitis. He was given fentanyl, Zofran and was started on empiric abx. General surgery was consulted with plans for possible surgical intervention. HsT normal at 10>>9. Creatinine stable at 0.60. WBC within normal limits. BC's obtained per admitting team. Cardiology has been asked to evaluate for cardiac clearance prior to surgery given his CAD hx.   He was most recently seen in cardiology follow up for preoperative clearance prior to an EGD. He had an esophageal dilation 09/2019 and had  since had progressive issues with swallowing. He was noted to have occasional chest Short for which he used SL NTG however pattern felt to be unchanged. He was ultimately cleared to proceed with EGD that was performed 06/2020. DAPT plan was to hold Plavix 5 days prior and restart ASAP thereafter. Last echocardiogram 01/11/2017 with LVEF at 45-50% with mild LVH, distal anterior, anteroseptal, apical and inferoapical akinesis suggestive of infarct, G1DD, and trivial MR.    Past Medical History:  Diagnosis Date  . Anxiety   . Barrett's esophagus   . Cardiomyopathy in other diseases classified elsewhere   . Coronary artery disease   . Depression   . Diverticulosis   . Emphysema, unspecified ([place])   . Esophageal stricture   . GERD (gastroesophageal reflux disease)   . History of ETOH abuse   . Hypertension   . Myocardial infarction ([place]) 2009  . Pure hypercholesterolemia   . Smoker   . Stented coronary artery   . Tubular adenoma of colon 2012  . Wears glasses     Past Surgical History:  Procedure Laterality Date  . APPENDECTOMY  2011   during colectomy  . CARDIAC CATHETERIZATION  2009   placed 2 stents  . CARDIAC CATHETERIZATION N/A 09/27/2016   Procedure: Left Heart Cath and Coronary Angiography;  Surgeon: Jeremiah Prows, MD;  Location: [place];  Service: Cardiovascular;  Laterality: N/A;  . CARDIAC CATHETERIZATION N/A 09/27/2016   Procedure: Coronary Stent Intervention;  Surgeon: Jeremiah Prows, MD;  Location: [place] [facility];  Service: Cardiovascular;  Laterality: N/A;  . CORONARY STENT PLACEMENT    . HERNIA REPAIR    . INGUINAL HERNIA REPAIR Left 09/16/2013   Procedure: HERNIA REPAIR INGUINAL ADULT;  Surgeon: Jeremiah Faster. Cornett, MD;  Location: [place];  Service: General;  Laterality: Left;  . MOUTH SURGERY    . PARTIAL COLECTOMY  2011   sigmoid  . TONSILLECTOMY    . WISDOM TOOTH EXTRACTION       Prior to Admission medications   Medication Sig Start Date  End Date Taking? Authorizing Provider  aspirin EC 81 MG EC tablet Take 1 tablet (81 mg total) by mouth daily. 09/30/16  Yes Kilroy, Luke K, PA-C  atorvastatin (LIPITOR) 80 MG tablet TAKE 1 TABLET (80 MG TOTAL) BY MOUTH DAILY AT 6 PM. 10/26/20  Yes Jeremiah Nutting, DO  bismuth subsalicylate (PEPTO BISMOL) 262 MG/15ML suspension Take 30 mLs by mouth every 6 (six) hours as needed for indigestion or diarrhea or loose stools.   Yes [provider]  busPIRone (BUSPAR) 10 MG tablet Take 1 tablet (10 mg total) by mouth 2 (two) times daily. 11/05/20  Yes Jeremiah Nutting, DO  clopidogrel (PLAVIX) 75 MG tablet TAKE 1 TABLET BY MOUTH DAILY. Patient taking differently: Take 75 mg by mouth daily. 08/23/20  Yes Jeremiah Pain, MD  isosorbide mononitrate (IMDUR) 60 MG 24 hr tablet TAKE 1 TABLET BY MOUTH EVERY DAY Patient taking differently: Take 60 mg by mouth daily. 10/26/20  Yes Jeremiah Pain, MD  lansoprazole (PREVACID) 30 MG capsule Take 1 capsule (30 mg total) by mouth daily before breakfast. 12/22/20  Yes Jeremiah Pain, MD  loperamide (IMODIUM) 2 MG capsule Take 2 mg by mouth as needed for diarrhea or loose stools.   Yes [provider]  losartan (COZAAR) 50 MG tablet Take 1 tablet (50 mg total) by mouth daily. Please make yearly appt with Dr. Marlou Short for August 2022 for future refills. Thank you 1st attempt 02/02/21  Yes Jeremiah Pain, MD  Multiple Vitamin (MULTIVITAMIN) capsule Take 1 capsule by mouth daily.   Yes [provider]  nitroGLYCERIN (NITROSTAT) 0.4 MG SL tablet PLACE 1 TABLET UNDER THE TONGUE EVERY 5 MINUTES FOR 3 DOSES AS NEEDED FOR CHEST Short Patient taking differently: Place 0.4 mg under the tongue every 5 (five) minutes as needed for chest Short. 01/25/21  Yes Jeremiah Pain, MD  venlafaxine XR (EFFEXOR-XR) 75 MG 24 hr capsule Take 3 capsules (225 mg total) by mouth daily with breakfast. 02/02/21 05/03/21 Yes Jeremiah Nutting, DO  B-D HYPODERMIC NEEDLE 22GX1" 22G X 1" MISC  SMARTSIG:1 Injection Every 2 Weeks 04/21/20   [provider]  carvedilol (COREG) 6.25 MG tablet Take 1 tablet (6.25 mg total) by mouth 2 (two) times daily. Patient not taking: Reported on 02/24/2021 05/31/20   Burtis Junes, NP    Inpatient Medications: Scheduled Meds: . acetaminophen  650 mg Oral W4O  . folic acid  1 mg Oral Daily  . multivitamin with minerals  1 tablet Oral Daily  . nicotine  14 mg Transdermal QHS  . nicotine  21 mg Transdermal Once  . thiamine  100 mg Oral Daily   Or  . thiamine  100 mg Intravenous Daily   Continuous Infusions: . sodium chloride 75 mL/hr at 02/24/21 0113  . [START ON 02/25/2021] cefTRIAXone (ROCEPHIN)  IV    . metronidazole 500 mg (02/24/21 0813)   PRN Meds: [DISCONTINUED] acetaminophen **OR** acetaminophen, HYDROmorphone (DILAUDID) injection,  labetalol, LORazepam **OR** LORazepam, methocarbamol, oxyCODONE  Allergies:    Allergies  Allergen Reactions  . Morphine And Related Other (See Comments)    dont remember the symptoms    Social History:   Social History   Socioeconomic History  . Marital status: Divorced    Spouse name: Not on file  . Number of children: 2  . Years of education: Not on file  . Highest education level: Not on file  Occupational History  . Occupation: Glass blower/designer  Tobacco Use  . Smoking status: Current Every Day Smoker    Packs/day: 0.75    Years: 40.00    Pack years: 30.00    Types: E-cigarettes, Cigarettes  . Smokeless tobacco: Never Used  . Tobacco comment: Pt given handout to quit smoking  Vaping Use  . Vaping Use: Never used  Substance and Sexual Activity  . Alcohol use: Yes    Alcohol/week: 90.0 standard drinks    Types: 90 Shots of liquor per week    Comment: 1/5 vodka every 3 days (750 ml)  . Drug use: No  . Sexual activity: Not on file  Other Topics Concern  . Not on file  Social History Narrative  . Not on file   Social Determinants of Health   Financial Resource Strain:  Not on file  Food Insecurity: Not on file  Transportation Needs: Not on file  Physical Activity: Not on file  Stress: Not on file  Social Connections: Not on file  Intimate Partner Violence: Not on file    Family History:   Family History  Problem Relation Age of Onset  . Lung cancer Mother   . Heart disease Father        has pacemaker  . Breast cancer Sister   . Colon cancer Neg Hx   . Colon polyps Neg Hx   . Esophageal cancer Neg Hx   . Kidney disease Neg Hx   . Diabetes Neg Hx   . Stomach cancer Neg Hx   . Rectal cancer Neg Hx    Family Status:  Family Status  Relation Name Status  . Mother  Deceased  . Father  Alive  . Sister  Alive  . MGM  Deceased  . MGF  Deceased  . PGM  Deceased  . PGF  Deceased  . Neg Hx  (Not Specified)    ROS:  Please see the history of present illness.  All other ROS reviewed and negative.     Physical Exam/Data:   Vitals:   02/23/21 2302 02/24/21 0153 02/24/21 0543 02/24/21 0858  BP: (!) 149/114 (!) 136/108 (!) 131/96 (!) 142/104  Pulse: (!) 103 (!) 50 68 (!) 55  Resp: 18 18 18 18   Temp: 97.9 F (36.6 C) 97.9 F (36.6 C) 97.8 F (36.6 C) 97.6 F (36.4 C)  TempSrc: Oral Oral Oral Oral  SpO2: 99% 94% 97% 98%  Weight:      Height:        Intake/Output Summary (Last 24 hours) at 02/24/2021 1053 Last data filed at 02/24/2021 1000 Gross per 24 hour  Intake 487.9 ml  Output 500 ml  Net -12.1 ml   Filed Weights   02/23/21 1623  Weight: 68 kg   Body mass index is 21.52 kg/m.   General: Well developed, well nourished, NAD Neck: Negative for carotid bruits. No JVD Lungs:Clear to ausculation bilaterally. No wheezes, rales, or rhonchi. Breathing is unlabored. Cardiovascular: RRR with S1 S2. No murmurs Abdomen: Soft, non-tender,  non-distended. No obvious abdominal masses. Extremities: No edema. Radial pulses 2+ bilaterally Neuro: Alert and oriented. No focal deficits. No facial asymmetry. MAE spontaneously. Psych: Responds  to questions appropriately with normal affect.    EKG:  The EKG was personally reviewed and demonstrates:  02/24/21 ST with HR 101 and evidence of anterior MI  Telemetry:  Telemetry was personally reviewed and demonstrates: Not currently on telemetry    Relevant CV Studies:  ECHO 01/11/17: - Left ventricle: The cavity size was normal. Wall thickness was increased in a pattern of mild LVH. Systolic function was mildly reduced. The estimated ejection fraction was in the range of 45% to 50%. No mural thrombus with Definity contrast. Distal anterior, anteroseptal, apical and inferoapical akinesis suggestive of infarct. Doppler parameters are consistent with abnormal left ventricular relaxation (grade 1 diastolic dysfunction). The E/e&' ratio is <8, suggesting normal LV filling pressure. - Mitral valve: Mildly thickened leaflets . There was trivial regurgitation. - Left atrium: The atrium was normal in size. - Systemic veins: The IVC measures >2.1 cm, but collapses more than 50%, suggesting an elevated RA pressure of 8 mmHg.  Impressions:  - Compared to a prior study in 10/2016, the LVEF has improved to 45-50%. There are persistent distal anterior, anteroseptal, apical and inferoapical wall motion abnormalities consistent with  LHC 09/27/2016:  Coronary angiogram 09/27/2016: LVEF 40% with mid to distal anterior apical and inferoapical akinesis. Mild disease RCA and circumflex. Mid LAD occluded just proximal to the previously placed stent, S/P 3.0 x 22 mm resolute onyx DES, postdilated with 3.5 x 12 [place] at 16 atmospheric, 100% reduced to 0%, D2 stent jailed, balloon antroplasty with 2.0 x 12 mm balloon, 99% reduced to less than 10%, TIMI-3 to TIMI-3 in both vessels.  Recommendation: Patient will need dual antiplatelet therapy for at least one year or longer. Needs compliance reiteration. Smoking cessation already discussed with the patient. Moderation in alcohol use  discussed.  Diagnostic Dominance: Co-dominant    Intervention      Laboratory Data:  Chemistry Recent Labs  Lab 02/23/21 1635 02/24/21 0413  NA 133* 136  Short 3.7 3.8  CL 96* 100  CO2 26 23  GLUCOSE 155* 119*  BUN 13 14  CREATININE 0.76 0.60*  CALCIUM 9.7 9.3  GFRNONAA >60 >60  ANIONGAP 11 13    Total Protein  Date Value Ref Range Status  02/24/2021 7.4 6.5 - 8.1 g/dL Final  07/17/2017 6.0 6.0 - 8.5 g/dL Final   Albumin  Date Value Ref Range Status  02/24/2021 3.8 3.5 - 5.0 g/dL Final  07/17/2017 4.1 3.5 - 5.5 g/dL Final   AST  Date Value Ref Range Status  02/24/2021 19 15 - 41 U/L Final   ALT  Date Value Ref Range Status  02/24/2021 19 0 - 44 U/L Final   Alkaline Phosphatase  Date Value Ref Range Status  02/24/2021 63 38 - 126 U/L Final   Total Bilirubin  Date Value Ref Range Status  02/24/2021 0.8 0.3 - 1.2 mg/dL Final   Bilirubin Total  Date Value Ref Range Status  07/17/2017 0.4 0.0 - 1.2 mg/dL Final   Hematology Recent Labs  Lab 02/23/21 1635 02/24/21 0413  WBC 8.3 8.1  RBC 4.90 4.82  HGB 16.8 16.2  HCT 48.6 47.9  MCV 99.2 99.4  MCH 34.3* 33.6  MCHC 34.6 33.8  RDW 13.3 13.4  PLT 162 140*   Cardiac EnzymesNo results for input(s): TROPONINI in the last 168 hours. No results for  input(s): TROPIPOC in the last 168 hours.  BNPNo results for input(s): BNP, PROBNP in the last 168 hours.  DDimer No results for input(s): DDIMER in the last 168 hours. TSH:  Lab Results  Component Value Date   TSH 2.51 12/31/2019   Lipids: Lab Results  Component Value Date   CHOL 124 08/19/2020   HDL 88 (A) 08/19/2020   LDLCALC 22 08/19/2020   LDLDIRECT 15.0 05/06/2018   TRIG 67 08/19/2020   CHOLHDL 1.7 12/31/2019   HgbA1c:No results found for: HGBA1C  Radiology/Studies:  CT Abdomen Pelvis Wo Contrast  Result Date: 02/23/2021 CLINICAL DATA:  Bilateral lower quadrant abdominal Short. History of diverticulitis with prior repair. EXAM: CT ABDOMEN  AND PELVIS WITHOUT CONTRAST TECHNIQUE: Multidetector CT imaging of the abdomen and pelvis was performed following the standard protocol without IV contrast. COMPARISON:  05/10/2020 abdominal ultrasound. Most recent CT of 10/07/2014 FINDINGS: Lower chest: Mild centrilobular emphysema with bibasilar nonspecific interstitial thickening. Normal heart size without pericardial or pleural effusion. Distal esophageal wall thickening is again identified, mild-to-moderate on 02/03. Hepatobiliary: Normal noncontrast appearance of the liver. A gallstone of 1.8 cm. Pericholecystic edema is mild to moderate on 26/3. Mild gallbladder wall thickening at up to 5 mm. No biliary duct dilatation. Pancreas: Mild pancreatic atrophy, without duct dilatation or acute inflammation. Spleen: Normal in size, without focal abnormality. Adrenals/Urinary Tract: Normal adrenal glands. Mild renal cortical thinning bilaterally. No renal calculi or hydronephrosis. No bladder calculi. Stomach/Bowel: Normal stomach, without wall thickening. Descending duodenal diverticulum. Otherwise normal small bowel. Extensive colonic diverticulosis. Vascular/Lymphatic: Aortic atherosclerosis. No abdominopelvic adenopathy. Reproductive: Normal prostate. Other: No significant free fluid. No free intraperitoneal air. Fat containing diminutive ventral abdominal wall hernia. Musculoskeletal: Degenerate disc disease at the lumbosacral junction. Superior endplate compression deformity at L1 results in approximately 30% vertebral body height loss anteriorly. No ventral canal encroachment. New since 10/17/2014. IMPRESSION: 1. Cholelithiasis with gallbladder wall thickening and pericholecystic edema, highly suspicious for acute cholecystitis. 2. No other explanation for patient's symptoms. 3. Distal esophageal wall thickening, suggesting esophagitis. 4. L1 compression deformity, new since 2016. 5. Aortic Atherosclerosis (ICD10-I70.0) and Emphysema (ICD10-J43.9).  Electronically Signed   By: Abigail Miyamoto M.D.   On: 02/23/2021 18:25   US Abdomen Limited  Result Date: 02/23/2021 CLINICAL DATA:  Right upper quadrant Short EXAM: ULTRASOUND ABDOMEN LIMITED RIGHT UPPER QUADRANT COMPARISON:  Ultrasound 05/10/2020, CT 02/23/2021 FINDINGS: Gallbladder: Gallbladder contains multiple echogenic, shadowing gallstones and tumefactive appearing adherent biliary sludge. Largest gallstone measures up to 2.5 cm in diameter. Markedly thickened gallbladder wall with edematous changes and some trace fluid gallbladder fossa. Sonographic Percell Miller sign is reportedly negative. Common bile duct: Diameter: 5 mm, nondilated Liver: Mild heterogeneity of hepatic parenchyma. Smooth surface contour. No concerning focal liver lesion. Portal vein is patent on color Doppler imaging with normal direction of blood flow towards the liver. Other: None. IMPRESSION: Mild hepatic heterogeneity, nonspecific but can be seen in the setting of intrinsic liver disease. Gallbladder containing admixed shadowing gallstones and biliary sludge. Marked gallbladder wall thickening and pericholecystic fluid. While sonographic Percell Miller sign is reportedly negative, this may be spuriously absent due to analgesic medications noted in patient's chart. Particularly given the CT appearance, findings remain suspicious for an acute cholecystitis though some degree of gallbladder wall thickening may be present in the setting of intrinsic liver disease. Electronically Signed   By: Lovena Le M.D.   On: 02/23/2021 19:58   Assessment and Plan:   1. Preoperative cardiac clearance: -Pt has a hx of  STEMI in 2009 with PCI to LAD>>recurrent STEMI 2017 with jailed D2 treated with angioplasty. He is relatively active at baseline and can perform greater than 4 METS without anginal symptoms. He has not needed to use his PRN SL NTG in over 4 months. He had some chest discomfort on ED presentation however this was felt to be in the setting of  acute abdominal Short and has not recurred. HsT negative at 10>>89. EKG with no acute changes. He does not appear to be fluid volume overloaded on exam today. Plavix has been held in anticipation of surgery. He was seen last year prior to EDG with recommendations to hold ASA and Plavix and did extremely well with this and no CV complications.  -According to [facility] and AHA guidelines, he requires no further cardiac workup prior to his noncardiac surgery and should be at acceptable but moderate risk. His revised cardiac risk of peri-procedural MI or cardiac arrest following surgery is 10.1% based on his history. Our service is available as necessary in the perioperative period. -Plavix on hold for surgery as above>>>restart when acceptable from a bleeding standpoint in the post-operative setting -Given history of stenting, would not hold aspirin, should continue aspirin 81 mg perioperatively -Restart losartan and carvedilol however will restart carvedilol at 3.125  2. CAD s/p STEMI x2 2009>>2017: -Initial STEMI in 2009 with PCI to LAD>>recurrent STEMI 2017 with jailed D2 treated with angioplasty. Has known chronic angina, treated with SL NTG and has been unchanged over the last several years per chart review.  -HsT negative at 10>>89 -EKG with no acute changes  -On indefinite DAPT with ASA and Plavix>>currently holding plavix but continue ASA -Restart statin and BB    3. Ischemic cardiomyopathy: -Echocardiogram from 2018 with LVEF at 45-50% with regional wall motion abnormalities.  -PTA medications include losartan 50mg  QD and carvedilol 6.25mg  BID>>will restart losartan and carvedilol -If BP tolerates, could eventually consider Entresto for ischemic cardiomyopathy and HTN  4. HLD: -Last LDL, 22 from 08/19/20 -Continue with high intensity statin   5. Tobacco/ETOH use: -Cessation encouraged -Daily ETOH use   For questions or updates, please contact [facility] Please consult [email or web address] for  contact info under Cardiology/STEMI.   Lyndel Safe NP-C [facility] Pager: [phone] 02/24/2021 10:53 AM  Patient seen and examined.  Agree with above documentation.  Jeremiah Short is a 60 year old male with a history of CAD status post anterior STEMI in 2009, recurrent STEMI in 2017 on indefinite DAPT, chronic systolic heart failure (EF 40 to 45%), hypertension, hyperlipidemia, tobacco use, GERD who we are consulted by Dr. Marlowe Short for preoperative evaluation.  Presented to [facility] yesterday with abdominal Short.  In the ED, initial vital signs notable for BP 123/97, pulse 62, SPO2 100% on room air.  Labs notable for creatinine 0.76, sodium 133, potassium 3.7, normal LFTs and lipase, WBC 8.3, hemoglobin 16.8.  CT abdomen pelvis showed cholelithiasis with gallbladder wall thickening and pericholecystic edema highly suspicious for acute cholecystitis.  EKG shows sinus tachycardia, rate 101, Q waves in inferior leads and V1- 6.  On exam, patient is alert and oriented, regular rate and rhythm, no murmurs, lungs CTAB, no LE edema or JVD.  He reports he is active, can walk up 2 flights of stairs without stopping and no anginal symptoms.  For his preop evaluation, currently denies any anginal symptoms.  Good functional capacity, greater than 4 METS.  RCRI score 2 (CAD, CHF).  No further cardiac work-up recommended prior to surgery.  Would  continue aspirin, carvedilol, atorvastatin perioperatively.  Okay to hold Plavix, can restart once okay from surgeons perspective postoperatively.  Would not hold aspirin given history of coronary stenting.  Donato Heinz, MD

## 2021-02-24 NOTE — H&P (Signed)
History and Physical    Jeremiah Short PTW:[id number] DOB: 1961-01-23 DOA: 02/23/2021  PCP: Luetta Nutting, DO Patient coming from: [facility]  Chief Complaint: Abdominal pain  HPI: Jeremiah Short is a 60 y.o. male with medical history significant of CAD status post PCI in 2009 and 2017, chronic combined CHF (EF 45 to 50% on echo done April 2018), hypertension, hyperlipidemia, emphysema, alcohol use disorder, tobacco use, MDD, anxiety, GERD, Barrett's esophagus, diverticulosis, history of abdominal surgeries including inguinal hernia repair, appendectomy, partial colectomy presented to the ED complaining of abdominal pain.  In the ED, slightly tachycardic but afebrile.  Not hypotensive.  Labs showing WBC 8.3, hemoglobin 16.8, platelet count 162K.  Sodium 133, potassium 3.7, chloride 96, bicarb 26, BUN 13, creatinine 0.7, glucose 155.  Lipase and LFTs normal.  UA not suggestive of infection.  Screening COVID and influenza PCR negative.  CT and ultrasound showing findings concerning for acute calculus cholecystitis. Medications administered include fentanyl, nicotine patch, Zofran, and ceftriaxone. ED PA discussed the case with Dr. Marlou Starks from general surgery who requested admission under medicine service and agreed to see the patient in consultation.  Patient reports 1 day history of generalized abdominal pain associated with some chest discomfort.  No associated nausea, vomiting, or diarrhea.  He is having chills but has not checked his temperature.  States he had an ultrasound done last year and was told he had gallstones.  Reports chronic cough due to smoking, no recent change.  He drinks several beers daily.  Denies cough.  He takes aspirin and Plavix, last dose was Tuesday, May 24 at around 10 AM.  Review of Systems:  All systems reviewed and apart from history of presenting illness, are negative.  Past Medical History:  Diagnosis Date  . Anxiety   . Barrett's esophagus   . Cardiomyopathy in other  diseases classified elsewhere   . Coronary artery disease   . Depression   . Diverticulosis   . Emphysema, unspecified ([place])   . Esophageal stricture   . GERD (gastroesophageal reflux disease)   . History of ETOH abuse   . Hypertension   . Myocardial infarction ([place]) 2009  . Pure hypercholesterolemia   . Smoker   . Stented coronary artery   . Tubular adenoma of colon 2012  . Wears glasses     Past Surgical History:  Procedure Laterality Date  . APPENDECTOMY  2011   during colectomy  . CARDIAC CATHETERIZATION  2009   placed 2 stents  . CARDIAC CATHETERIZATION N/A 09/27/2016   Procedure: Left Heart Cath and Coronary Angiography;  Surgeon: [name], MD;  Location: [place] [facility];  Service: Cardiovascular;  Laterality: N/A;  . CARDIAC CATHETERIZATION N/A 09/27/2016   Procedure: Coronary Stent Intervention;  Surgeon: [name], MD;  Location: [place] [facility];  Service: Cardiovascular;  Laterality: N/A;  . CORONARY STENT PLACEMENT    . HERNIA REPAIR    . INGUINAL HERNIA REPAIR Left 09/16/2013   Procedure: HERNIA REPAIR INGUINAL ADULT;  Surgeon: [name], MD;  Location: [place];  Service: General;  Laterality: Left;  . MOUTH SURGERY    . PARTIAL COLECTOMY  2011   sigmoid  . TONSILLECTOMY    . WISDOM TOOTH EXTRACTION       reports that he has been smoking e-cigarettes and cigarettes. He has a 30.00 pack-year smoking history. He has never used smokeless tobacco. He reports current alcohol use of about 90.0 standard drinks of alcohol per week. He reports  that he does not use drugs.  Allergies  Allergen Reactions  . Morphine And Related Other (See Comments)    dont remember the symptoms    Family History  Problem Relation Age of Onset  . Lung cancer Mother   . Heart disease Father        has pacemaker  . Breast cancer Sister   . Colon cancer Neg Hx   . Colon polyps Neg Hx   . Esophageal cancer Neg Hx   . Kidney disease Neg Hx   .  Diabetes Neg Hx   . Stomach cancer Neg Hx   . Rectal cancer Neg Hx     Prior to Admission medications   Medication Sig Start Date End Date Taking? Authorizing Provider  aspirin EC 81 MG EC tablet Take 1 tablet (81 mg total) by mouth daily. 09/30/16  Yes Kilroy, Luke K, PA-C  atorvastatin (LIPITOR) 80 MG tablet TAKE 1 TABLET (80 MG TOTAL) BY MOUTH DAILY AT 6 PM. 10/26/20  Yes Luetta Nutting, DO  busPIRone (BUSPAR) 10 MG tablet Take 1 tablet (10 mg total) by mouth 2 (two) times daily. 11/05/20  Yes Luetta Nutting, DO  carvedilol (COREG) 6.25 MG tablet Take 1 tablet (6.25 mg total) by mouth 2 (two) times daily. 05/31/20  Yes Burtis Junes, NP  clopidogrel (PLAVIX) 75 MG tablet TAKE 1 TABLET BY MOUTH DAILY. 08/23/20  Yes Jerline Pain, MD  lansoprazole (PREVACID) 30 MG capsule Take 1 capsule (30 mg total) by mouth daily before breakfast. 12/22/20  Yes Jerline Pain, MD  losartan (COZAAR) 50 MG tablet Take 1 tablet (50 mg total) by mouth daily. Please make yearly appt with Dr. [name] for August 2022 for future refills. Thank you 1st attempt 02/02/21  Yes Jerline Pain, MD  Multiple Vitamin (MULTIVITAMIN) capsule Take 1 capsule by mouth daily.   Yes [provider]  venlafaxine XR (EFFEXOR-XR) 75 MG 24 hr capsule Take 3 capsules (225 mg total) by mouth daily with breakfast. 02/02/21 05/03/21 Yes Luetta Nutting, DO  B-D HYPODERMIC NEEDLE 22GX1" 22G X 1" MISC SMARTSIG:1 Injection Every 2 Weeks 04/21/20   [provider]  isosorbide mononitrate (IMDUR) 60 MG 24 hr tablet TAKE 1 TABLET BY MOUTH EVERY DAY 10/26/20   Jerline Pain, MD  nitroGLYCERIN (NITROSTAT) 0.4 MG SL tablet PLACE 1 TABLET UNDER THE TONGUE EVERY 5 MINUTES FOR 3 DOSES AS NEEDED FOR CHEST PAIN 01/25/21   Jerline Pain, MD    Physical Exam: Vitals:   02/23/21 2030 02/23/21 2136 02/23/21 2220 02/23/21 2302  BP: (!) 143/108 (!) 153/118 (!) 133/110 (!) 149/114  Pulse: (!) 101  (!) 111 (!) 103  Resp: 16  18 18   Temp:   98.7 F  (37.1 C) 97.9 F (36.6 C)  TempSrc:   Oral Oral  SpO2: 96%  98% 99%  Weight:      Height:        Physical Exam Constitutional:      General: He is not in acute distress. HENT:     Mouth/Throat:     Mouth: Mucous membranes are moist.  Eyes:     Extraocular Movements: Extraocular movements intact.     Conjunctiva/sclera: Conjunctivae normal.  Cardiovascular:     Rate and Rhythm: Regular rhythm. Tachycardia present.     Pulses: Normal pulses.  Pulmonary:     Effort: Pulmonary effort is normal. No respiratory distress.     Breath sounds: Normal breath sounds. No wheezing or  rales.  Abdominal:     General: Bowel sounds are normal. There is no distension.     Palpations: Abdomen is soft.     Tenderness: There is abdominal tenderness. There is guarding.     Comments: Generalized tenderness to palpation with guarding  Musculoskeletal:        General: No swelling or tenderness.     Cervical back: Normal range of motion and neck supple.  Skin:    General: Skin is warm and dry.  Neurological:     General: No focal deficit present.     Mental Status: He is alert and oriented to person, place, and time.     Labs on Admission: I have personally reviewed following labs and imaging studies  CBC: Recent Labs  Lab 02/23/21 1635  WBC 8.3  NEUTROABS 5.7  HGB 16.8  HCT 48.6  MCV 99.2  PLT 762   Basic Metabolic Panel: Recent Labs  Lab 02/23/21 1635  NA 133*  K 3.7  CL 96*  CO2 26  GLUCOSE 155*  BUN 13  CREATININE 0.76  CALCIUM 9.7   GFR: Estimated Creatinine Clearance: 93.3 mL/min (by C-G formula based on SCr of 0.76 mg/dL). Liver Function Tests: Recent Labs  Lab 02/23/21 1635  AST 28  ALT 24  ALKPHOS 70  BILITOT 1.1  PROT 8.0  ALBUMIN 4.2   Recent Labs  Lab 02/23/21 1635  LIPASE 25   No results for input(s): AMMONIA in the last 168 hours. Coagulation Profile: No results for input(s): INR, PROTIME in the last 168 hours. Cardiac Enzymes: No results for  input(s): CKTOTAL, CKMB, CKMBINDEX, TROPONINI in the last 168 hours. BNP (last 3 results) No results for input(s): PROBNP in the last 8760 hours. HbA1C: No results for input(s): HGBA1C in the last 72 hours. CBG: No results for input(s): GLUCAP in the last 168 hours. Lipid Profile: No results for input(s): CHOL, HDL, LDLCALC, TRIG, CHOLHDL, LDLDIRECT in the last 72 hours. Thyroid Function Tests: No results for input(s): TSH, T4TOTAL, FREET4, T3FREE, THYROIDAB in the last 72 hours. Anemia Panel: No results for input(s): VITAMINB12, FOLATE, FERRITIN, TIBC, IRON, RETICCTPCT in the last 72 hours. Urine analysis:    Component Value Date/Time   COLORURINE YELLOW 02/23/2021 1628   APPEARANCEUR CLEAR 02/23/2021 1628   LABSPEC 1.025 02/23/2021 1628   PHURINE 6.0 02/23/2021 1628   GLUCOSEU NEGATIVE 02/23/2021 1628   HGBUR SMALL (A) 02/23/2021 1628   BILIRUBINUR NEGATIVE 02/23/2021 1628   KETONESUR NEGATIVE 02/23/2021 1628   PROTEINUR NEGATIVE 02/23/2021 1628   UROBILINOGEN 1.0 10/03/2009 1500   NITRITE NEGATIVE 02/23/2021 1628   LEUKOCYTESUR NEGATIVE 02/23/2021 1628    Radiological Exams on Admission: CT Abdomen Pelvis Wo Contrast  Result Date: 02/23/2021 CLINICAL DATA:  Bilateral lower quadrant abdominal pain. History of diverticulitis with prior repair. EXAM: CT ABDOMEN AND PELVIS WITHOUT CONTRAST TECHNIQUE: Multidetector CT imaging of the abdomen and pelvis was performed following the standard protocol without IV contrast. COMPARISON:  05/10/2020 abdominal ultrasound. Most recent CT of 10/07/2014 FINDINGS: Lower chest: Mild centrilobular emphysema with bibasilar nonspecific interstitial thickening. Normal heart size without pericardial or pleural effusion. Distal esophageal wall thickening is again identified, mild-to-moderate on 02/03. Hepatobiliary: Normal noncontrast appearance of the liver. A gallstone of 1.8 cm. Pericholecystic edema is mild to moderate on 26/3. Mild gallbladder wall  thickening at up to 5 mm. No biliary duct dilatation. Pancreas: Mild pancreatic atrophy, without duct dilatation or acute inflammation. Spleen: Normal in size, without focal abnormality. Adrenals/Urinary Tract:  Normal adrenal glands. Mild renal cortical thinning bilaterally. No renal calculi or hydronephrosis. No bladder calculi. Stomach/Bowel: Normal stomach, without wall thickening. Descending duodenal diverticulum. Otherwise normal small bowel. Extensive colonic diverticulosis. Vascular/Lymphatic: Aortic atherosclerosis. No abdominopelvic adenopathy. Reproductive: Normal prostate. Other: No significant free fluid. No free intraperitoneal air. Fat containing diminutive ventral abdominal wall hernia. Musculoskeletal: Degenerate disc disease at the lumbosacral junction. Superior endplate compression deformity at L1 results in approximately 30% vertebral body height loss anteriorly. No ventral canal encroachment. New since 10/17/2014. IMPRESSION: 1. Cholelithiasis with gallbladder wall thickening and pericholecystic edema, highly suspicious for acute cholecystitis. 2. No other explanation for patient's symptoms. 3. Distal esophageal wall thickening, suggesting esophagitis. 4. L1 compression deformity, new since 2016. 5. Aortic Atherosclerosis (ICD10-I70.0) and Emphysema (ICD10-J43.9). Electronically Signed   By: Abigail Miyamoto M.D.   On: 02/23/2021 18:25   US Abdomen Limited  Result Date: 02/23/2021 CLINICAL DATA:  Right upper quadrant pain EXAM: ULTRASOUND ABDOMEN LIMITED RIGHT UPPER QUADRANT COMPARISON:  Ultrasound 05/10/2020, CT 02/23/2021 FINDINGS: Gallbladder: Gallbladder contains multiple echogenic, shadowing gallstones and tumefactive appearing adherent biliary sludge. Largest gallstone measures up to 2.5 cm in diameter. Markedly thickened gallbladder wall with edematous changes and some trace fluid gallbladder fossa. Sonographic Percell Miller sign is reportedly negative. Common bile duct: Diameter: 5 mm,  nondilated Liver: Mild heterogeneity of hepatic parenchyma. Smooth surface contour. No concerning focal liver lesion. Portal vein is patent on color Doppler imaging with normal direction of blood flow towards the liver. Other: None. IMPRESSION: Mild hepatic heterogeneity, nonspecific but can be seen in the setting of intrinsic liver disease. Gallbladder containing admixed shadowing gallstones and biliary sludge. Marked gallbladder wall thickening and pericholecystic fluid. While sonographic Percell Miller sign is reportedly negative, this may be spuriously absent due to analgesic medications noted in patient's chart. Particularly given the CT appearance, findings remain suspicious for an acute cholecystitis though some degree of gallbladder wall thickening may be present in the setting of intrinsic liver disease. Electronically Signed   By: Lovena Le M.D.   On: 02/23/2021 19:58    EKG: No EKG done in the ED.  EKG ordered now and currently pending.  Assessment/Plan Principal Problem:   Acute cholecystitis Active Problems:   Hypertension   Pure hypercholesterolemia   Excessive drinking alcohol   SIRS (systemic inflammatory response syndrome) (HCC)   SIRS secondary to acute calculus cholecystitis Patient presenting with complaint of generalized abdominal pain.  Lipase and LFTs normal. CT and ultrasound showing findings concerning for acute calculus cholecystitis.  Meets 1 SIRS criteria at this time (slight tachycardia).  No fever or leukocytosis. -Continue ceftriaxone.  Add on Flagyl.  Keep n.p.o., gentle IV fluid hydration.  Fentanyl as needed for pain.  Blood culture x2 ordered.  Hold home aspirin and Plavix (last dose Tuesday, May 24 at around 10 AM per patient).  I have notified Dr. Marlou Starks that the patient is here now, will consult in a.m.  Chest pain CAD status post PCI in 2009 and 2017 Complaining of chest discomfort since the onset of his abdominal pain. -Hold home aspirin and Plavix.  Resume  beta-blocker and statin after pharmacy med rec is done.  Check high-sensitivity troponin x2.  EKG ordered.  Chronic combined CHF Echo done April 2018 showing EF 45 to 50% and grade 1 diastolic dysfunction.  No signs of volume overload at this time. -Continue to monitor volume status closely.  Hypertension Diastolic elevated. -Resume home antihypertensives after pharmacy med rec is done.  IV labetalol PRN.   Hyperlipidemia -  Resume statin after pharmacy med rec is done.  Alcohol use disorder Slightly tachycardic but no other signs of withdrawal at this time. -CIWA protocol; Ativan as needed.  Thiamine, folate, and multivitamin.  Check mag and Phos levels.  Tobacco use -NicoDerm patch and counseling  MDD, anxiety -Resume home meds after pharmacy med rec is done.  GERD, Barrett's esophagus -Resume PPI after pharmacy med rec is done  L1 compression deformity Seen on CT and reported to be new since 2016 per radiologist.  Patient is not complaining of back pain.  DVT prophylaxis: SCDs Code Status: Full code Family Communication: No family at bedside. Disposition Plan: Status is: Inpatient  Remains inpatient appropriate because:Inpatient level of care appropriate due to severity of illness and Needs surgery for acute cholecystitis   Dispo: The patient is from: Home              Anticipated d/c is to: Home              Patient currently is not medically stable to d/c.   Difficult to place patient No  Consults called: General surgery Level of care: Level of care: Med-Surg   The medical decision making on this patient was of high complexity and the patient is at high risk for clinical deterioration, therefore this is a level 3 visit.  Shela Leff MD [facility]  If 7PM-7AM, please contact night-coverage [email or web address]  02/24/2021, 12:41 AM

## 2021-02-24 NOTE — Consult Note (Signed)
Consult Note  Jeremiah Short 05-23-1961  [id number].    Requesting MD: Dr. [name] Chief Complaint/Reason for Consult: acute cholecystitis  HPI:  60 year old male with history of CAD status post PCI 2009 and December 2017, hypertension, third esophagus, GERD, depression, anxiety who presented to [facility] with abdominal pain. Work up showed CT scan with Cholelithiasis with gallbladder wall thickening and pericholecystic edema, highly suspicious for acute cholecystitis and RUQ [place] with gallstones and biliary sludge.  He was transferred to [facility] and admitted to the medicine service for further evaluation and management. We were asked to see.  He reports abdominal pain starting early morning the day of his presentation to the ED. It woke him from sleep. He has not had nausea and emesis. His pain is not worsened after eating. He has not had constipation/diarrhea. His BM have been regular. He has hemorrhoids for which he sees GI and has noted blood on occasion related to this. He has not had fever. He has had chills at home at night.  He is known to [facility] and has seen Dr. [name] in the past most recently for left inguinal hernia repair. He had a partial colectomy and incidental appendectomy in 2011 for perforated diverticulitis with abscess by Dr. [name].  He has significant cardiac history and sees Dr. [name] outpatient. He has had MI x2 (2009, 2017) with drug eluting and bare metal stents and is on DAPT with plavix - last dose Tuesday 10 am.  His pain his currently 8/10 with out aggravating or alleviating factors. He reports and has charted allergy to morphine but cannot remember reaction and has had opioid medications since.  Substance use: every day smoker (3/4 ppd), 1 beer per day, 1/2 gallon vodka on weekends Allergies: morphine - "can't remember" reaction Blood thinners: plavix - last taken 5/24 at 10 am Past Surgeries: partial colectomy with appendectomy 2011  and inguinal hernia repair  He denies formal diagnosis of COPD or asthma in setting of tobacco use. At baseline states he mobilizes without an assistive device.  Works in [place]: [employer] Systems  Constitutional: Positive for chills. Negative for fever.  Respiratory: Positive for cough and shortness of breath. Negative for wheezing.   Cardiovascular: Negative for chest pain, palpitations, orthopnea and leg swelling.  Gastrointestinal: Positive for abdominal pain. Negative for diarrhea, melena, nausea and vomiting.  Genitourinary: Negative for dysuria.    Family History  Problem Relation Age of Onset  . Lung cancer Mother   . Heart disease Father        has pacemaker  . Breast cancer Sister   . Colon cancer Neg Hx   . Colon polyps Neg Hx   . Esophageal cancer Neg Hx   . Kidney disease Neg Hx   . Diabetes Neg Hx   . Stomach cancer Neg Hx   . Rectal cancer Neg Hx     Past Medical History:  Diagnosis Date  . Anxiety   . Barrett's esophagus   . Cardiomyopathy in other diseases classified elsewhere   . Coronary artery disease   . Depression   . Diverticulosis   . Emphysema, unspecified ([place])   . Esophageal stricture   . GERD (gastroesophageal reflux disease)   . History of ETOH abuse   . Hypertension   . Myocardial infarction ([place]) 2009  . Pure hypercholesterolemia   . Smoker   . Stented coronary artery   . Tubular adenoma  of colon 2012  . Wears glasses     Past Surgical History:  Procedure Laterality Date  . APPENDECTOMY  2011   during colectomy  . CARDIAC CATHETERIZATION  2009   placed 2 stents  . CARDIAC CATHETERIZATION N/A 09/27/2016   Procedure: Left Heart Cath and Coronary Angiography;  Surgeon: [name], MD;  Location: [facility];  Service: Cardiovascular;  Laterality: N/A;  . CARDIAC CATHETERIZATION N/A 09/27/2016   Procedure: Coronary Stent Intervention;  Surgeon: [name], MD;  Location: [facility];  Service:  Cardiovascular;  Laterality: N/A;  . CORONARY STENT PLACEMENT    . HERNIA REPAIR    . INGUINAL HERNIA REPAIR Left 09/16/2013   Procedure: HERNIA REPAIR INGUINAL ADULT;  Surgeon: [name], MD;  Location: [facility];  Service: General;  Laterality: Left;  . MOUTH SURGERY    . PARTIAL COLECTOMY  2011   sigmoid  . TONSILLECTOMY    . WISDOM TOOTH EXTRACTION      Social History:  reports that he has been smoking e-cigarettes and cigarettes. He has a 30.00 pack-year smoking history. He has never used smokeless tobacco. He reports current alcohol use of about 90.0 standard drinks of alcohol per week. He reports that he does not use drugs.  Allergies:  Allergies  Allergen Reactions  . Morphine And Related Other (See Comments)    dont remember the symptoms    Medications Prior to Admission  Medication Sig Dispense Refill  . aspirin EC 81 MG EC tablet Take 1 tablet (81 mg total) by mouth daily.    [name] atorvastatin (LIPITOR) 80 MG tablet TAKE 1 TABLET (80 MG TOTAL) BY MOUTH DAILY AT 6 PM. 90 tablet 1  . busPIRone (BUSPAR) 10 MG tablet Take 1 tablet (10 mg total) by mouth 2 (two) times daily. 60 tablet 3  . carvedilol (COREG) 6.25 MG tablet Take 1 tablet (6.25 mg total) by mouth 2 (two) times daily. 180 tablet 3  . clopidogrel (PLAVIX) 75 MG tablet TAKE 1 TABLET BY MOUTH DAILY. 90 tablet 2  . lansoprazole (PREVACID) 30 MG capsule Take 1 capsule (30 mg total) by mouth daily before breakfast. 90 capsule 3  . losartan (COZAAR) 50 MG tablet Take 1 tablet (50 mg total) by mouth daily. Please make yearly appt with Dr. [name] for August 2022 for future refills. Thank you 1st attempt 90 tablet 0  . Multiple Vitamin (MULTIVITAMIN) capsule Take 1 capsule by mouth daily.    [name] venlafaxine XR (EFFEXOR-XR) 75 MG 24 hr capsule Take 3 capsules (225 mg total) by mouth daily with breakfast. 270 capsule 0  . B-D HYPODERMIC NEEDLE 22GX1" 22G X 1" MISC SMARTSIG:1 Injection Every 2 Weeks    .  isosorbide mononitrate (IMDUR) 60 MG 24 hr tablet TAKE 1 TABLET BY MOUTH EVERY DAY 90 tablet 1  . nitroGLYCERIN (NITROSTAT) 0.4 MG SL tablet PLACE 1 TABLET UNDER THE TONGUE EVERY 5 MINUTES FOR 3 DOSES AS NEEDED FOR CHEST PAIN 25 tablet 2    Blood pressure (!) 131/96, pulse 68, temperature 97.8 F (36.6 C), temperature source Oral, resp. rate 18, height 5\' 10"  (1.778 m), weight 68 kg, SpO2 97 %. Physical Exam:  General: pleasant, WD, male who is laying in bed in NAD HEENT: head is normocephalic, atraumatic.  Sclera are noninjected.  Ears and nose without any masses or lesions.  Mouth is pink and moist Heart: regular, rate, and rhythm.  Normal s1,s2. No obvious murmurs, gallops, or  rubs noted.  Palpable radial and pedal pulses bilaterally Lungs: CTAB, no wheezes, rhonchi, or rales noted.  Respiratory effort nonlabored Abd: soft, ND, +BS, no masses, hernias, or organomegaly. No tenderness to palpation diffusely. Negative Murphy's sign. Well healed midline surgical scar MS: all 4 extremities are symmetrical with no cyanosis, clubbing, or edema. Skin: warm and dry with no masses, lesions, or rashes Neuro: Cranial nerves 2-12 grossly intact, sensation is normal throughout Psych: A&Ox3 with an appropriate affect.   Results for orders placed or performed during the hospital encounter of 02/23/21 (from the past 48 hour(s))  Urinalysis, Routine w reflex microscopic     Status: Abnormal   Collection Time: 02/23/21  4:28 PM  Result Value Ref Range   Color, Urine YELLOW YELLOW   APPearance CLEAR CLEAR   Specific Gravity, Urine 1.025 1.005 - 1.030   pH 6.0 5.0 - 8.0   Glucose, UA NEGATIVE NEGATIVE mg/dL   Hgb urine dipstick SMALL (A) NEGATIVE   Bilirubin Urine NEGATIVE NEGATIVE   Ketones, ur NEGATIVE NEGATIVE mg/dL   Protein, ur NEGATIVE NEGATIVE mg/dL   Nitrite NEGATIVE NEGATIVE   Leukocytes,Ua NEGATIVE NEGATIVE    Comment: Performed at [facility], [address] [place],  [place] [address]  Urinalysis, Microscopic (reflex)     Status: Abnormal   Collection Time: 02/23/21  4:28 PM  Result Value Ref Range   RBC / HPF 6-10 0 - 5 RBC/hpf   WBC, UA NONE SEEN 0 - 5 WBC/hpf   Bacteria, UA RARE (A) NONE SEEN   Squamous Epithelial / LPF 0-5 0 - 5    Comment: Performed at [facility], [address] [place], [place] [address]  Comprehensive metabolic panel     Status: Abnormal   Collection Time: 02/23/21  4:35 PM  Result Value Ref Range   Sodium 133 (L) 135 - 145 mmol/L   Potassium 3.7 3.5 - 5.1 mmol/L   Chloride 96 (L) 98 - 111 mmol/L   CO2 26 22 - 32 mmol/L   Glucose, Bld 155 (H) 70 - 99 mg/dL    Comment: Glucose reference range applies only to samples taken after fasting for at least 8 hours.   BUN 13 8 - 23 mg/dL   Creatinine, Ser 0.76 0.61 - 1.24 mg/dL   Calcium 9.7 8.9 - 10.3 mg/dL   Total Protein 8.0 6.5 - 8.1 g/dL   Albumin 4.2 3.5 - 5.0 g/dL   AST 28 15 - 41 U/L   ALT 24 0 - 44 U/L   Alkaline Phosphatase 70 38 - 126 U/L   Total Bilirubin 1.1 0.3 - 1.2 mg/dL   GFR, Estimated >60 >60 mL/min    Comment: (NOTE) Calculated using the CKD-EPI Creatinine Equation (2021)    Anion gap 11 5 - 15    Comment: Performed at [facility], [address] [place], [place] [address]  CBC with Differential     Status: Abnormal   Collection Time: 02/23/21  4:35 PM  Result Value Ref Range   WBC 8.3 4.0 - 10.5 K/uL   RBC 4.90 4.22 - 5.81 MIL/uL   Hemoglobin 16.8 13.0 - 17.0 g/dL   HCT 48.6 39.0 - 52.0 %   MCV 99.2 80.0 - 100.0 fL   MCH 34.3 (H) 26.0 - 34.0 pg   MCHC 34.6 30.0 - 36.0 g/dL   RDW 13.3 11.5 - 15.5 %   Platelets 162 150 - 400 K/uL  nRBC 0.0 0.0 - 0.2 %   Neutrophils Relative % 68 %   Neutro Abs 5.7 1.7 - 7.7 K/uL   Lymphocytes Relative 18 %   Lymphs Abs 1.5 0.7 - 4.0 K/uL   Monocytes Relative 12 %   Monocytes Absolute 1.0 0.1 - 1.0 K/uL   Eosinophils Relative 1 %   Eosinophils Absolute 0.1 0.0 - 0.5 K/uL   Basophils Relative  1 %   Basophils Absolute 0.0 0.0 - 0.1 K/uL   Immature Granulocytes 0 %   Abs Immature Granulocytes 0.03 0.00 - 0.07 K/uL    Comment: Performed at [facility], [address] [place], [place] [address]  Lipase, blood     Status: None   Collection Time: 02/23/21  4:35 PM  Result Value Ref Range   Lipase 25 11 - 51 U/L    Comment: Performed at [facility], [address] [place], [place] [address]  Resp Panel by RT-PCR (Flu A&B, Covid) Nasopharyngeal Swab     Status: None   Collection Time: 02/23/21  8:53 PM   Specimen: Nasopharyngeal Swab; Nasopharyngeal(NP) swabs in vial transport medium  Result Value Ref Range   SARS Coronavirus 2 by RT PCR NEGATIVE NEGATIVE    Comment: (NOTE) SARS-CoV-2 target nucleic acids are NOT DETECTED.  The SARS-CoV-2 RNA is generally detectable in upper respiratory specimens during the acute phase of infection. The lowest concentration of SARS-CoV-2 viral copies this assay can detect is 138 copies/mL. A negative result does not preclude SARS-Cov-2 infection and should not be used as the sole basis for treatment or other patient management decisions. A negative result may occur with  improper specimen collection/handling, submission of specimen other than nasopharyngeal swab, presence of viral mutation(s) within the areas targeted by this assay, and inadequate number of viral copies(<138 copies/mL). A negative result must be combined with clinical observations, patient history, and epidemiological information. The expected result is Negative.  Fact Sheet for Patients:  EntrepreneurPulse.com.au  Fact Sheet for Healthcare Providers:  IncredibleEmployment.be  This test is no t yet approved or cleared by the [place] FDA and  has been authorized for detection and/or diagnosis of SARS-CoV-2 by FDA under an Emergency Use Authorization (EUA). This EUA will remain  in effect (meaning this test  can be used) for the duration of the COVID-19 declaration under Section 564(b)(1) of the Act, 21 U.S.C.section 360bbb-3(b)(1), unless the authorization is terminated  or revoked sooner.       Influenza A by PCR NEGATIVE NEGATIVE   Influenza B by PCR NEGATIVE NEGATIVE    Comment: (NOTE) The Xpert Xpress SARS-CoV-2/FLU/RSV plus assay is intended as an aid in the diagnosis of influenza from Nasopharyngeal swab specimens and should not be used as a sole basis for treatment. Nasal washings and aspirates are unacceptable for Xpert Xpress SARS-CoV-2/FLU/RSV testing.  Fact Sheet for Patients: EntrepreneurPulse.com.au  Fact Sheet for Healthcare Providers: IncredibleEmployment.be  This test is not yet approved or cleared by the [place] FDA and has been authorized for detection and/or diagnosis of SARS-CoV-2 by FDA under an Emergency Use Authorization (EUA). This EUA will remain in effect (meaning this test can be used) for the duration of the COVID-19 declaration under Section 564(b)(1) of the Act, 21 U.S.C. section 360bbb-3(b)(1), unless the authorization is terminated or revoked.  Performed at [facility], [address] [place], [place] [address]   Culture, blood (routine x 2)     Status: None (  Preliminary result)   Collection Time: 02/24/21  1:41 AM   Specimen: BLOOD RIGHT HAND  Result Value Ref Range   Specimen Description      BLOOD RIGHT HAND Performed at [facility], [place] [address], [place], [place] [address]    Special Requests      BOTTLES DRAWN AEROBIC ONLY Blood Culture adequate volume Performed at [place] [address], [place], [place] [address]    Culture PENDING    Report Status PENDING   Culture, blood (routine x 2)     Status: None (Preliminary result)   Collection Time: 02/24/21  1:41 AM   Specimen: BLOOD RIGHT ARM  Result Value Ref Range   Specimen Description       BLOOD RIGHT ARM Performed at [facility], [place] [address] [place], [place] [address]    Special Requests      BOTTLES DRAWN AEROBIC ONLY Blood Culture adequate volume Performed at [facility] [address], [place], [place] [address]    Culture PENDING    Report Status PENDING   Troponin I (High Sensitivity)     Status: None   Collection Time: 02/24/21  1:41 AM  Result Value Ref Range   Troponin I (High Sensitivity) 10 <18 ng/L    Comment: (NOTE) Elevated high sensitivity troponin I (hsTnI) values and significant  changes across serial measurements may suggest ACS but many other  chronic and acute conditions are known to elevate hsTnI results.  Refer to the "Links" section for chest pain algorithms and additional  guidance. Performed at [facility], [place] [address], [place], [place] [address]   MRSA PCR Screening     Status: None   Collection Time: 02/24/21  3:20 AM   Specimen: Nasal Mucosa; Nasopharyngeal  Result Value Ref Range   MRSA by PCR NEGATIVE NEGATIVE    Comment:        The GeneXpert MRSA Assay (FDA approved for NASAL specimens only), is one component of a comprehensive MRSA colonization surveillance program. It is not intended to diagnose MRSA infection nor to guide or monitor treatment for MRSA infections. Performed at [facility], [place] [address], [place], [place] [address]   Comprehensive metabolic panel     Status: Abnormal   Collection Time: 02/24/21  4:13 AM  Result Value Ref Range   Sodium 136 135 - 145 mmol/L   Potassium 3.8 3.5 - 5.1 mmol/L   Chloride 100 98 - 111 mmol/L   CO2 23 22 - 32 mmol/L   Glucose, Bld 119 (H) 70 - 99 mg/dL    Comment: Glucose reference range applies only to samples taken after fasting for at least 8 hours.   BUN 14 8 - 23 mg/dL   Creatinine, Ser 0.60 (L) 0.61 - 1.24 mg/dL   Calcium 9.3 8.9 - 10.3 mg/dL   Total Protein 7.4 6.5 - 8.1 g/dL   Albumin 3.8 3.5  - 5.0 g/dL   AST 19 15 - 41 U/L   ALT 19 0 - 44 U/L   Alkaline Phosphatase 63 38 - 126 U/L   Total Bilirubin 0.8 0.3 - 1.2 mg/dL   GFR, Estimated >60 >60 mL/min    Comment: (NOTE) Calculated using the CKD-EPI Creatinine Equation (2021)    Anion gap 13 5 - 15    Comment: Performed at [facility], [place] [address], [place], [place] [address]  Phosphorus     Status:  None   Collection Time: 02/24/21  4:13 AM  Result Value Ref Range   Phosphorus 3.8 2.5 - 4.6 mg/dL    Comment: Performed at [facility], [place] [address], [place], [place] [address]  CBC     Status: Abnormal   Collection Time: 02/24/21  4:13 AM  Result Value Ref Range   WBC 8.1 4.0 - 10.5 K/uL   RBC 4.82 4.22 - 5.81 MIL/uL   Hemoglobin 16.2 13.0 - 17.0 g/dL   HCT 47.9 39.0 - 52.0 %   MCV 99.4 80.0 - 100.0 fL   MCH 33.6 26.0 - 34.0 pg   MCHC 33.8 30.0 - 36.0 g/dL   RDW 13.4 11.5 - 15.5 %   Platelets 140 (L) 150 - 400 K/uL   nRBC 0.0 0.0 - 0.2 %    Comment: Performed at [facility], [place] [address] [place], [place] [address]  Magnesium     Status: None   Collection Time: 02/24/21  4:13 AM  Result Value Ref Range   Magnesium 1.8 1.7 - 2.4 mg/dL    Comment: Performed at [facility], [place] [address], [place], [place] [address]  Troponin I (High Sensitivity)     Status: None   Collection Time: 02/24/21  4:13 AM  Result Value Ref Range   Troponin I (High Sensitivity) 9 <18 ng/L    Comment: (NOTE) Elevated high sensitivity troponin I (hsTnI) values and significant  changes across serial measurements may suggest ACS but many other  chronic and acute conditions are known to elevate hsTnI results.  Refer to the "Links" section for chest pain algorithms and additional  guidance. Performed at [facility], [place] [address], [place], [place] [address]    CT Abdomen Pelvis Wo Contrast  Result Date: 02/23/2021 CLINICAL DATA:   Bilateral lower quadrant abdominal pain. History of diverticulitis with prior repair. EXAM: CT ABDOMEN AND PELVIS WITHOUT CONTRAST TECHNIQUE: Multidetector CT imaging of the abdomen and pelvis was performed following the standard protocol without IV contrast. COMPARISON:  05/10/2020 abdominal ultrasound. Most recent CT of 10/07/2014 FINDINGS: Lower chest: Mild centrilobular emphysema with bibasilar nonspecific interstitial thickening. Normal heart size without pericardial or pleural effusion. Distal esophageal wall thickening is again identified, mild-to-moderate on 02/03. Hepatobiliary: Normal noncontrast appearance of the liver. A gallstone of 1.8 cm. Pericholecystic edema is mild to moderate on 26/3. Mild gallbladder wall thickening at up to 5 mm. No biliary duct dilatation. Pancreas: Mild pancreatic atrophy, without duct dilatation or acute inflammation. Spleen: Normal in size, without focal abnormality. Adrenals/Urinary Tract: Normal adrenal glands. Mild renal cortical thinning bilaterally. No renal calculi or hydronephrosis. No bladder calculi. Stomach/Bowel: Normal stomach, without wall thickening. Descending duodenal diverticulum. Otherwise normal small bowel. Extensive colonic diverticulosis. Vascular/Lymphatic: Aortic atherosclerosis. No abdominopelvic adenopathy. Reproductive: Normal prostate. Other: No significant free fluid. No free intraperitoneal air. Fat containing diminutive ventral abdominal wall hernia. Musculoskeletal: Degenerate disc disease at the lumbosacral junction. Superior endplate compression deformity at L1 results in approximately 30% vertebral body height loss anteriorly. No ventral canal encroachment. New since 10/17/2014. IMPRESSION: 1. Cholelithiasis with gallbladder wall thickening and pericholecystic edema, highly suspicious for acute cholecystitis. 2. No other explanation for patient's symptoms. 3. Distal esophageal wall thickening, suggesting esophagitis. 4. L1 compression  deformity, new since 2016. 5. Aortic Atherosclerosis (ICD10-I70.0) and Emphysema (ICD10-J43.9). Electronically Signed   By: Abigail Miyamoto M.D.   On: 02/23/2021 18:25   US Abdomen Limited  Result Date: 02/23/2021 CLINICAL DATA:  Right upper quadrant pain EXAM: ULTRASOUND ABDOMEN LIMITED RIGHT UPPER QUADRANT COMPARISON:  Ultrasound 05/10/2020, CT 02/23/2021 FINDINGS: Gallbladder: Gallbladder contains multiple echogenic, shadowing gallstones and tumefactive appearing adherent biliary sludge. Largest gallstone measures up to 2.5 cm in diameter. Markedly thickened gallbladder wall with edematous changes and some trace fluid gallbladder fossa. Sonographic Percell Miller sign is reportedly negative. Common bile duct: Diameter: 5 mm, nondilated Liver: Mild heterogeneity of hepatic parenchyma. Smooth surface contour. No concerning focal liver lesion. Portal vein is patent on color Doppler imaging with normal direction of blood flow towards the liver. Other: None. IMPRESSION: Mild hepatic heterogeneity, nonspecific but can be seen in the setting of intrinsic liver disease. Gallbladder containing admixed shadowing gallstones and biliary sludge. Marked gallbladder wall thickening and pericholecystic fluid. While sonographic Percell Miller sign is reportedly negative, this may be spuriously absent due to analgesic medications noted in patient's chart. Particularly given the CT appearance, findings remain suspicious for an acute cholecystitis though some degree of gallbladder wall thickening may be present in the setting of intrinsic liver disease. Electronically Signed   By: Lovena Le M.D.   On: 02/23/2021 19:58      Assessment/Plan Hypertension Pure hypercholesterolemia Excessive drinking alcohol CAD CHF GERD  Acute cholecystitis - continue ceftriaxone/flagyl - will consult cardiology for cardiac risk stratification in perioperative setting - given his past surgical history and need to delay intervention secondary to  plavix - possible lap cholecystectomy vs perc drain placement with expectant management  - will need to be off plavix for 4-5 days prior to lap chole - have adjusted pain medications including addition of dilaudid/oxycodone - he has a charted allergy from 2011 with unknown reaction which he cant remember and has taken opioid pain medications since   FEN: heart healthy ID: rocephin/falgyl 5/25>> VTE: SCDs, lovenox, hold plavix  Dispo: continue antibiotics, hold plavix, definitive surgical plan pending  Winferd Humphrey, [facility] 02/24/2021, 7:57 AM Please see Amion for pager number during day hours 7:00am-4:30pm

## 2021-02-25 ENCOUNTER — Encounter (HOSPITAL_COMMUNITY): Payer: Self-pay | Admitting: Internal Medicine

## 2021-02-25 NOTE — Progress Notes (Signed)
Progress Note     Subjective: CC: feeling well this am. Pain well controlled - no reactions to oxycodone or dilaudid. Tolerating diet without nausea/emesis. No cardiac or respiratory complaints. Last BM PTA  Previously reported he last took Plavix on Tuesday but after thinking about it more he states he last took it Monday  Objective: Vital signs in last 24 hours: Temp:  [97.6 F (36.4 C)-98.4 F (36.9 C)] 98 F (36.7 C) (05/27 0500) Pulse Rate:  [55-96] 80 (05/27 0500) Resp:  [16-18] 16 (05/27 0500) BP: (105-146)/(68-107) 120/69 (05/27 0500) SpO2:  [95 %-99 %] 99 % (05/27 0500) Last BM Date: 02/23/21  Intake/Output from previous day: 05/26 0701 - 05/27 0700 In: 2017.8 [P.O.:1070; I.V.:553; IV Piggyback:394.8] Out: 300 [Urine:300] Intake/Output this shift: No intake/output data recorded.  PE: General: pleasant, WD, male who is sitting up in bed in NAD HEENT: head is normocephalic, atraumatic. glasses Heart: regular, rate, and rhythm.  Palpable radial and pedal pulses bilaterally Lungs: CTAB, no wheezes, rhonchi, or rales noted.  Respiratory effort nonlabored Abd: soft, NT, ND, +BS MS: all 4 extremities are symmetrical with no cyanosis, clubbing, or edema. Skin: warm and dry Psych: A&Ox3 with an appropriate affect.    Lab Results:  Recent Labs    02/23/21 1635 02/24/21 0413  WBC 8.3 8.1  HGB 16.8 16.2  HCT 48.6 47.9  PLT 162 140*   BMET Recent Labs    02/23/21 1635 02/24/21 0413  NA 133* 136  K 3.7 3.8  CL 96* 100  CO2 26 23  GLUCOSE 155* 119*  BUN 13 14  CREATININE 0.76 0.60*  CALCIUM 9.7 9.3   PT/INR No results for input(s): LABPROT, INR in the last 72 hours. CMP     Component Value Date/Time   NA 136 02/24/2021 0413   NA 140 05/13/2018 0852   K 3.8 02/24/2021 0413   CL 100 02/24/2021 0413   CO2 23 02/24/2021 0413   GLUCOSE 119 (H) 02/24/2021 0413   BUN 14 02/24/2021 0413   BUN 22 05/13/2018 0852   CREATININE 0.60 (L) 02/24/2021 0413    CREATININE 0.99 12/31/2019 0857   CALCIUM 9.3 02/24/2021 0413   PROT 7.4 02/24/2021 0413   PROT 6.0 07/17/2017 1449   ALBUMIN 3.8 02/24/2021 0413   ALBUMIN 4.1 07/17/2017 1449   AST 19 02/24/2021 0413   ALT 19 02/24/2021 0413   ALKPHOS 63 02/24/2021 0413   BILITOT 0.8 02/24/2021 0413   BILITOT 0.4 07/17/2017 1449   GFRNONAA >60 02/24/2021 0413   GFRNONAA 83 12/31/2019 0857   GFRAA 96 12/31/2019 0857   Lipase     Component Value Date/Time   LIPASE 25 02/23/2021 1635       Studies/Results: CT Abdomen Pelvis Wo Contrast  Result Date: 02/23/2021 CLINICAL DATA:  Bilateral lower quadrant abdominal pain. History of diverticulitis with prior repair. EXAM: CT ABDOMEN AND PELVIS WITHOUT CONTRAST TECHNIQUE: Multidetector CT imaging of the abdomen and pelvis was performed following the standard protocol without IV contrast. COMPARISON:  05/10/2020 abdominal ultrasound. Most recent CT of 10/07/2014 FINDINGS: Lower chest: Mild centrilobular emphysema with bibasilar nonspecific interstitial thickening. Normal heart size without pericardial or pleural effusion. Distal esophageal wall thickening is again identified, mild-to-moderate on 02/03. Hepatobiliary: Normal noncontrast appearance of the liver. A gallstone of 1.8 cm. Pericholecystic edema is mild to moderate on 26/3. Mild gallbladder wall thickening at up to 5 mm. No biliary duct dilatation. Pancreas: Mild pancreatic atrophy, without duct dilatation or acute inflammation. Spleen:  Normal in size, without focal abnormality. Adrenals/Urinary Tract: Normal adrenal glands. Mild renal cortical thinning bilaterally. No renal calculi or hydronephrosis. No bladder calculi. Stomach/Bowel: Normal stomach, without wall thickening. Descending duodenal diverticulum. Otherwise normal small bowel. Extensive colonic diverticulosis. Vascular/Lymphatic: Aortic atherosclerosis. No abdominopelvic adenopathy. Reproductive: Normal prostate. Other: No significant free  fluid. No free intraperitoneal air. Fat containing diminutive ventral abdominal wall hernia. Musculoskeletal: Degenerate disc disease at the lumbosacral junction. Superior endplate compression deformity at L1 results in approximately 30% vertebral body height loss anteriorly. No ventral canal encroachment. New since 10/17/2014. IMPRESSION: 1. Cholelithiasis with gallbladder wall thickening and pericholecystic edema, highly suspicious for acute cholecystitis. 2. No other explanation for patient's symptoms. 3. Distal esophageal wall thickening, suggesting esophagitis. 4. L1 compression deformity, new since 2016. 5. Aortic Atherosclerosis (ICD10-I70.0) and Emphysema (ICD10-J43.9). Electronically Signed   By: Abigail Miyamoto M.D.   On: 02/23/2021 18:25   US Abdomen Limited  Result Date: 02/23/2021 CLINICAL DATA:  Right upper quadrant pain EXAM: ULTRASOUND ABDOMEN LIMITED RIGHT UPPER QUADRANT COMPARISON:  Ultrasound 05/10/2020, CT 02/23/2021 FINDINGS: Gallbladder: Gallbladder contains multiple echogenic, shadowing gallstones and tumefactive appearing adherent biliary sludge. Largest gallstone measures up to 2.5 cm in diameter. Markedly thickened gallbladder wall with edematous changes and some trace fluid gallbladder fossa. Sonographic Percell Miller sign is reportedly negative. Common bile duct: Diameter: 5 mm, nondilated Liver: Mild heterogeneity of hepatic parenchyma. Smooth surface contour. No concerning focal liver lesion. Portal vein is patent on color Doppler imaging with normal direction of blood flow towards the liver. Other: None. IMPRESSION: Mild hepatic heterogeneity, nonspecific but can be seen in the setting of intrinsic liver disease. Gallbladder containing admixed shadowing gallstones and biliary sludge. Marked gallbladder wall thickening and pericholecystic fluid. While sonographic Percell Miller sign is reportedly negative, this may be spuriously absent due to analgesic medications noted in patient's chart.  Particularly given the CT appearance, findings remain suspicious for an acute cholecystitis though some degree of gallbladder wall thickening may be present in the setting of intrinsic liver disease. Electronically Signed   By: Lovena Le M.D.   On: 02/23/2021 19:58    Anti-infectives: Anti-infectives (From admission, onward)   Start     Dose/Rate Route Frequency Ordered Stop   02/25/21 0000  cefTRIAXone (ROCEPHIN) 2 g in sodium chloride 0.9 % 100 mL IVPB        2 g 200 mL/hr over 30 Minutes Intravenous Every 24 hours 02/24/21 0035     02/24/21 0100  cefTRIAXone (ROCEPHIN) 1 g in sodium chloride 0.9 % 100 mL IVPB        1 g 200 mL/hr over 30 Minutes Intravenous  Once 02/24/21 0035 02/24/21 0151   02/24/21 0100  metroNIDAZOLE (FLAGYL) IVPB 500 mg        500 mg 100 mL/hr over 60 Minutes Intravenous Every 8 hours 02/24/21 0035     02/23/21 2045  cefTRIAXone (ROCEPHIN) 1 g in sodium chloride 0.9 % 100 mL IVPB        1 g 200 mL/hr over 30 Minutes Intravenous  Once 02/23/21 2039 02/23/21 2131       Assessment/Plan Hypertension Pure hypercholesterolemia Excessive drinking alcohol CAD - hold Plavix. Continue ASA perioperatively. Cards has seen - no further pre op work up CHF GERD  Acute cholecystitis - continue ceftriaxone/flagyl - given his past surgical history and need to delay intervention secondary to plavix - possible lap cholecystectomy vs perc drain placement with expectant management             -  will need to be off plavix for 4-5 days prior to lap chole - possible sx date 5/28 or 5/29 - pain well controlled   FEN: heart healthy, NPO MN in case of surgery ID: rocephin/falgyl 5/25>> VTE: SCDs, lovenox, hold plavix. Continue ASA  Dispo: continue antibiotics, hold plavix, definitive surgical plan pending    LOS: 2 days    Winferd Humphrey, [facility] 02/25/2021, 8:52 AM Please see Amion for pager number during day hours 7:00am-4:30pm

## 2021-02-25 NOTE — Progress Notes (Signed)
PROGRESS NOTE  Jeremiah Short HYQ:[id number] DOB: 12/09/1960 DOA: 02/23/2021 PCP: Luetta Nutting, DO  Brief History   60 year old man PMH including CAD, CHF, Barrett's esophagus, multiple abdominal surgeries presented with abdominal pain generalized and right upper quadrant.  Admitted for acute cholecystitis.  Hospitalization prolonged by need to wait for Plavix to washout.  Plan for cholecystectomy next 48 hours.  A & P  Acute calculus cholecystitis -- Remains afebrile, vital signs stable.  Continue ceftriaxone and Flagyl. -- Cleared for surgery by cardiology. --Continue management per general surgery.  CAD status post PCI.  Denies chest pain now although was noted in H&P.Troponins negative.  EKG nonacute. --No further cardiac work-up indicated per cardiology. -- Continue aspirin.  Resume Plavix when cleared by surgery.  Chronic combined systolic and diastolic CHF, ischemic cardiomyopathy.  April 2018 LVEF 25-30%. --Appears euvolemic.  Stable. --Continue losartan and carvedilol.  Consider transition to [facility] as an outpatient.  Essential hypertension -- Remained stable.  Continue carvedilol and losartan.  Hyperlipidemia --Continue statin.  Alcohol use --No signs of withdrawal.  Continue alcohol withdrawal protocol.  GERD, Barrett's esophagus --PPI  L1 compression deformity indeterminate age new since 2016.  Patient asymptomatic. --Follow-up with PCP as an outpatient, consider further evaluation   Aortic atherosclerosis --Continue statin  Emphysema --Quiescent.  Recommend stop smoking.  Disposition Plan:  Discussion:   Status is: Inpatient  Remains inpatient appropriate because:IV treatments appropriate due to intensity of illness or inability to take PO and Inpatient level of care appropriate due to severity of illness   Dispo: The patient is from: Home              Anticipated d/c is to: Home              Patient currently is not medically stable to d/c.    Difficult to place patient No  DVT prophylaxis: SCDs Start: 02/24/21 1131 SCDs Start: 02/24/21 0034   Code Status: Full Code Level of care: Med-Surg Family Communication: none  Murray Hodgkins, MD  [facility] Direct contact: see [email or web address] (further directions at bottom of note if needed) 7PM-7AM contact night coverage as at bottom of note 02/25/2021, 2:19 PM  LOS: 2 days   Significant Hospital Events   . 5/25 admit for acute cholecystitis   Consults:  . General surgery  . Cardiology    Procedures:  .   Significant Diagnostic Tests:  [name]    Micro Data:  .    Antimicrobials:  . Ceftriaxone and Flagyl 5/25 >   Interval History/Subjective  CC: f/u abd pain  Some pain under right ribs w/ movement. No CP, no SOB  Objective   Vitals:  Vitals:   02/25/21 0500 02/25/21 1415  BP: 120/69 104/80  Pulse: 80 92  Resp: 16 18  Temp: 98 F (36.7 C) 97.6 F (36.4 C)  SpO2: 99% 96%    Exam:  Constitutional:   . Appears calm and comfortable ENMT:  . grossly normal hearing  Respiratory:  . CTA bilaterally, no w/r/r.  . Respiratory effort normal. Cardiovascular:  . RRR, no m/r/g . No LE edema Abdomen:  . soft Psychiatric:  . Mental status o Mood, affect appropriate  I have personally reviewed the following:   Today's Data  . HIV NR  Scheduled Meds: . acetaminophen  650 mg Oral Q6H  . aspirin EC  81 mg Oral Daily  . atorvastatin  80 mg Oral q1800  . carvedilol  3.125 mg Oral BID WC  . folic acid  1 mg Oral Daily  . losartan  50 mg Oral Daily  . multivitamin with minerals  1 tablet Oral Daily  . nicotine  14 mg Transdermal QHS  . thiamine  100 mg Oral Daily   Or  . thiamine  100 mg Intravenous Daily   Continuous Infusions: . cefTRIAXone (ROCEPHIN)  IV 2 g (02/24/21 2330)  . metronidazole 500 mg (02/25/21 0820)    Principal Problem:   Acute cholecystitis Active Problems:   Benign essential HTN   Pure hypercholesterolemia   Excessive  drinking alcohol   CAD (coronary artery disease)   Chronic combined systolic (congestive) and diastolic (congestive) heart failure (HCC)   Aortic atherosclerosis (HCC)   Emphysema lung (HCC)   Closed compression fracture of body of L1 vertebra ([place])   LOS: 2 days   How to contact the [facility] Attending or Consulting provider 7A - 7P or covering provider during after hours [place], for this patient?  1. Check the care team in [facility] and look for a) attending/consulting TRH provider listed and b) the [facility] team listed 2. Log into [email or web address] and use 's universal password to access. If you do not have the password, please contact the hospital operator. 3. Locate the [facility] provider you are looking for under [facility] and page to a number that you can be directly reached. 4. If you still have difficulty reaching the provider, please page the [facility] (Director on Call) for the Hospitalists listed on amion for assistance.

## 2021-02-25 NOTE — TOC Initial Note (Signed)
Transition of Care [facility]) - Initial/Assessment Note    Patient Details  Name: Jeremiah Short MRN: [id number] Date of Birth: 1961/06/17  Transition of Care [facility]) CM/SW Contact:    Lennart Pall, LCSW Phone Number: 02/25/2021, 3:04 PM  Clinical Narrative:                 Received TOC order for SA counseling/ resources.  Met with pt today to introduce self/ role.  Pt reports that he lives alone but has very good support from sister and friend who live locally.  Addressed MD concern for SA issues, however, pt quickly denies.  He notes, "I drink a little beer during the week and vodka on the weekend.  I'm a binge drinker but not out of control."  He cites that he has been without alcohol since his admit and "I'm doing fine."  He does not feel that he needs any support resources/ referrals and is aware that he will be counseled by medical team to decrease/ stop drinking.  TOC available if any further needs arise.  Expected Discharge Plan: Home/Self Care Barriers to Discharge: Continued Medical Work up   Patient Goals and CMS Choice Patient states their goals for this hospitalization and ongoing recovery are:: return home      Expected Discharge Plan and Services Expected Discharge Plan: Home/Self Care In-house Referral: Clinical Social Work     Living arrangements for the past 2 months: Single Family Home                 DME Arranged: N/A DME Agency: NA                  Prior Living Arrangements/Services Living arrangements for the past 2 months: Single Family Home Lives with:: Self Patient language and need for interpreter reviewed:: Yes Do you feel safe going back to the place where you live?: Yes      Need for Family Participation in Patient Care: No (Comment) Care giver support system in place?: Yes (comment)   Criminal Activity/Legal Involvement Pertinent to Current Situation/Hospitalization: No - Comment as needed  Activities of Daily Living Home Assistive  Devices/Equipment: Eyeglasses ADL Screening (condition at time of admission) Patient's cognitive ability adequate to safely complete daily activities?: Yes Is the patient deaf or have difficulty hearing?: No Does the patient have difficulty seeing, even when wearing glasses/contacts?: No Does the patient have difficulty concentrating, remembering, or making decisions?: No Patient able to express need for assistance with ADLs?: Yes Does the patient have difficulty dressing or bathing?: No Independently performs ADLs?: Yes (appropriate for developmental age) Does the patient have difficulty walking or climbing stairs?: No Weakness of Legs: None Weakness of Arms/Hands: None  Permission Sought/Granted Permission sought to share information with : Family Supports Permission granted to share information with : Yes, Verbal Permission Granted  Share Information with NAME: Amalia Hailey     Permission granted to share info w Relationship: sister  Permission granted to share info w Contact Information: [phone]  Emotional Assessment Appearance:: Appears stated age Attitude/Demeanor/Rapport: Gracious,Engaged Affect (typically observed): Accepting Orientation: : Oriented to Place,Oriented to Self,Oriented to  Time,Oriented to Situation Alcohol / Substance Use: Not Applicable Psych Involvement: No (comment)  Admission diagnosis:  Acute cholecystitis [K81.0] Patient Active Problem List   Diagnosis Date Noted  . SIRS (systemic inflammatory response syndrome) ([place]) 02/24/2021  . CAD (coronary artery disease) 02/24/2021  . Chronic combined systolic (congestive) and diastolic (congestive) heart failure ([place]) 02/24/2021  . Aortic atherosclerosis (  [place]) 02/24/2021  . Emphysema lung ([place]) 02/24/2021  . Closed compression fracture of body of L1 vertebra ([place]) 02/24/2021  . Acute cholecystitis 02/23/2021  . MDD (major depressive disorder) 02/02/2021  . Excessive drinking alcohol 05/28/2020  .  Calculus of gallbladder without cholecystitis without obstruction 05/17/2020  . Barrett's esophagus without dysplasia 05/17/2020  . Macrocytic anemia 03/10/2020  . Decreased appetite 03/10/2020  . Fatigue 03/10/2020  . Sprain of acromioclavicular ligament 09/25/2019  . Low testosterone 04/30/2019  . MDD (major depressive disorder), recurrent episode, moderate ([place]) 12/17/2018  . Chronic bilateral low back pain without sciatica 06/17/2018  . Seborrheic keratosis 05/30/2018  . Well adult exam 05/06/2018  . Nicotine dependence, cigarettes, uncomplicated 74/14/2395  . Acute ST elevation myocardial infarction (STEMI) involving left anterior descending (LAD) coronary artery ([place]) 09/27/2016  . Coronary artery disease   . Benign essential HTN   . Myocardial infarction ([place])   . Pure hypercholesterolemia   . Cardiomyopathy in other diseases classified elsewhere   . Anxiety    PCP:  Luetta Nutting, DO Pharmacy:   [id number] [id number]: [phone]: [phone]    Social Determinants of Health (SDOH) Interventions    Readmission Risk Interventions Readmission Risk Prevention Plan 02/25/2021  Post Dischage Appt Complete  Medication Screening Complete  Transportation Screening Complete  Some recent data might be hidden

## 2021-02-26 DIAGNOSIS — I10 Essential (primary) hypertension: Secondary | ICD-10-CM

## 2021-02-26 LAB — COMPREHENSIVE METABOLIC PANEL
ALT: 13 U/L (ref 0–44)
AST: 18 U/L (ref 15–41)
Albumin: 2.8 g/dL — ABNORMAL LOW (ref 3.5–5.0)
Alkaline Phosphatase: 46 U/L (ref 38–126)
Anion gap: 5 (ref 5–15)
BUN: 28 mg/dL — ABNORMAL HIGH (ref 8–23)
CO2: 28 mmol/L (ref 22–32)
Calcium: 8.6 mg/dL — ABNORMAL LOW (ref 8.9–10.3)
Chloride: 101 mmol/L (ref 98–111)
Creatinine, Ser: 0.89 mg/dL (ref 0.61–1.24)
GFR, Estimated: >60 mL/min (ref >60)
Glucose, Bld: 100 mg/dL — ABNORMAL HIGH (ref 70–99)
Potassium: 3.3 mmol/L — ABNORMAL LOW (ref 3.5–5.1)
Sodium: 134 mmol/L — ABNORMAL LOW (ref 135–145)
Total Bilirubin: 0.5 mg/dL (ref 0.3–1.2)
Total Protein: 5.9 g/dL — ABNORMAL LOW (ref 6.5–8.1)

## 2021-02-26 LAB — CBC
HCT: 37.3 % — ABNORMAL LOW (ref 39.0–52.0)
Hemoglobin: 12.5 g/dL — ABNORMAL LOW (ref 13.0–17.0)
MCH: 33.9 pg (ref 26.0–34.0)
MCHC: 33.5 g/dL (ref 30.0–36.0)
MCV: 101.1 fL — ABNORMAL HIGH (ref 80.0–100.0)
Platelets: 130 10*3/uL — ABNORMAL LOW (ref 150–400)
RBC: 3.69 MIL/uL — ABNORMAL LOW (ref 4.22–5.81)
RDW: 13.5 % (ref 11.5–15.5)
WBC: 7.4 10*3/uL (ref 4.0–10.5)
nRBC: 0 % (ref 0.0–0.2)

## 2021-02-26 MED ORDER — CHLORHEXIDINE GLUCONATE CLOTH 2 % EX PADS
6.0000 | MEDICATED_PAD | Freq: Once | CUTANEOUS | Status: AC
  Administered 2021-02-26: 6 via TOPICAL

## 2021-02-26 MED ORDER — CEFAZOLIN SODIUM-DEXTROSE 2-4 GM/100ML-% IV SOLN: 2.0000 g | INTRAVENOUS | Status: DC

## 2021-02-26 NOTE — H&P (View-Only) (Signed)
Subjective/Chief Complaint: Patient feels okay.  Minimal abdominal pain today.  Discussed laparoscopic cholecystectomy for tomorrow morning which will be 5 days off his Plavix.  Discussed previous surgery and implications it may have a laparoscopic approach and the potential need for open procedure and/or increased complication rate.  Also discussed complications of gallbladder surgery at length.  I reviewed the procedure with him as well.The procedure has been discussed with the patient. Operative and non operative treatments have been discussed. Risks of surgery include bleeding, infection,  Common bile duct injury,  Injury to the stomach,liver, colon,small intestine, abdominal wall,  Diaphragm,  Major blood vessels,  And the need for an open procedure.  Other risks include worsening of medical problems, death,  DVT and pulmonary embolism, and cardiovascular events.   Medical options have also been discussed. The patient has been informed of long term expectations of surgery and non surgical options,  The patient agrees to proceed.    Other option would be percutaneous drainage but this would not be a good long-term solution at this point time.   Objective: Vital signs in last 24 hours: Temp:  [97.6 F (36.4 C)-98.5 F (36.9 C)] 98.2 F (36.8 C) (05/28 0514) Pulse Rate:  [92-101] 93 (05/28 0514) Resp:  [16-18] 16 (05/28 0514) BP: (98-104)/(55-80) 102/72 (05/28 0514) SpO2:  [94 %-100 %] 94 % (05/28 0514) Last BM Date: 02/22/21  Intake/Output from previous day: 05/27 0701 - 05/28 0700 In: 920 [P.O.:820; IV Piggyback:100] Out: 150 [Urine:150] Intake/Output this shift: No intake/output data recorded.   General: pleasant, WD, male who is sitting up in bed in NAD HEENT: head is normocephalic, atraumatic. glasses Heart: regular, rate, and rhythm.  Palpable radial and pedal pulses bilaterally Lungs: CTAB, no wheezes, rhonchi, or rales noted.  Respiratory effort nonlabored Abd: soft,  NT, ND, +BS MS: all 4 extremities are symmetrical with no cyanosis, clubbing, or edema. Skin: warm and dry Psych: A&Ox3 with an appropriate affect. Lab Results:  Recent Labs    02/23/21 1635 02/24/21 0413  WBC 8.3 8.1  HGB 16.8 16.2  HCT 48.6 47.9  PLT 162 140*   BMET Recent Labs    02/23/21 1635 02/24/21 0413  NA 133* 136  K 3.7 3.8  CL 96* 100  CO2 26 23  GLUCOSE 155* 119*  BUN 13 14  CREATININE 0.76 0.60*  CALCIUM 9.7 9.3   PT/INR No results for input(s): LABPROT, INR in the last 72 hours. ABG No results for input(s): PHART, HCO3 in the last 72 hours.  Invalid input(s): PCO2, PO2  Studies/Results: No results found.  Anti-infectives: Anti-infectives (From admission, onward)   Start     Dose/Rate Route Frequency Ordered Stop   02/25/21 0000  cefTRIAXone (ROCEPHIN) 2 g in sodium chloride 0.9 % 100 mL IVPB        2 g 200 mL/hr over 30 Minutes Intravenous Every 24 hours 02/24/21 0035     02/24/21 0100  cefTRIAXone (ROCEPHIN) 1 g in sodium chloride 0.9 % 100 mL IVPB        1 g 200 mL/hr over 30 Minutes Intravenous  Once 02/24/21 0035 02/24/21 0151   02/24/21 0100  metroNIDAZOLE (FLAGYL) IVPB 500 mg        500 mg 100 mL/hr over 60 Minutes Intravenous Every 8 hours 02/24/21 0035     02/23/21 2045  cefTRIAXone (ROCEPHIN) 1 g in sodium chloride 0.9 % 100 mL IVPB        1 g 200 mL/hr over  30 Minutes Intravenous  Once 02/23/21 2039 02/23/21 2131      Assessment/Plan:     Hypertension Pure hypercholesterolemia Excessive drinking alcohol CAD - hold Plavix. Continue ASA perioperatively. Cards has seen - no further pre op work up CHF GERD  Acute cholecystitis - continue ceftriaxone/flagyl  - will need to be off plavix for 4-5 days prior to lap chole - possible sx date 5/28 or 5/29 - pain well controlled   FEN: heart healthy, NPO MN in case of surgery ID: rocephin/falgyl 5/25>> VTE: SCDs, lovenox, hold plavix. Continue ASA  Dispo:  continue antibiotics, hold plavix, LAP CHOLE IN AM   Cayden Granholm A Demia Viera 02/26/2021

## 2021-02-26 NOTE — Progress Notes (Signed)
Subjective/Chief Complaint: Patient feels okay.  Minimal abdominal pain today.  Discussed laparoscopic cholecystectomy for tomorrow morning which will be 5 days off his Plavix.  Discussed previous surgery and implications it may have a laparoscopic approach and the potential need for open procedure and/or increased complication rate.  Also discussed complications of gallbladder surgery at length.  I reviewed the procedure with him as well.The procedure has been discussed with the patient. Operative and non operative treatments have been discussed. Risks of surgery include bleeding, infection,  Common bile duct injury,  Injury to the stomach,liver, colon,small intestine, abdominal wall,  Diaphragm,  Major blood vessels,  And the need for an open procedure.  Other risks include worsening of medical problems, death,  DVT and pulmonary embolism, and cardiovascular events.   Medical options have also been discussed. The patient has been informed of long term expectations of surgery and non surgical options,  The patient agrees to proceed.    Other option would be percutaneous drainage but this would not be a good long-term solution at this point time.   Objective: Vital signs in last 24 hours: Temp:  [97.6 F (36.4 C)-98.5 F (36.9 C)] 98.2 F (36.8 C) (05/28 0514) Pulse Rate:  [92-101] 93 (05/28 0514) Resp:  [16-18] 16 (05/28 0514) BP: (98-104)/(55-80) 102/72 (05/28 0514) SpO2:  [94 %-100 %] 94 % (05/28 0514) Last BM Date: 02/22/21  Intake/Output from previous day: 05/27 0701 - 05/28 0700 In: 920 [P.O.:820; IV Piggyback:100] Out: 150 [Urine:150] Intake/Output this shift: No intake/output data recorded.   General: pleasant, WD, male who is sitting up in bed in NAD HEENT: head is normocephalic, atraumatic. glasses Heart: regular, rate, and rhythm.  Palpable radial and pedal pulses bilaterally Lungs: CTAB, no wheezes, rhonchi, or rales noted.  Respiratory effort nonlabored Abd: soft,  NT, ND, +BS MS: all 4 extremities are symmetrical with no cyanosis, clubbing, or edema. Skin: warm and dry Psych: A&Ox3 with an appropriate affect. Lab Results:  Recent Labs    02/23/21 1635 02/24/21 0413  WBC 8.3 8.1  HGB 16.8 16.2  HCT 48.6 47.9  PLT 162 140*   BMET Recent Labs    02/23/21 1635 02/24/21 0413  NA 133* 136  K 3.7 3.8  CL 96* 100  CO2 26 23  GLUCOSE 155* 119*  BUN 13 14  CREATININE 0.76 0.60*  CALCIUM 9.7 9.3   PT/INR No results for input(s): LABPROT, INR in the last 72 hours. ABG No results for input(s): PHART, HCO3 in the last 72 hours.  Invalid input(s): PCO2, PO2  Studies/Results: No results found.  Anti-infectives: Anti-infectives (From admission, onward)   Start     Dose/Rate Route Frequency Ordered Stop   02/25/21 0000  cefTRIAXone (ROCEPHIN) 2 g in sodium chloride 0.9 % 100 mL IVPB        2 g 200 mL/hr over 30 Minutes Intravenous Every 24 hours 02/24/21 0035     02/24/21 0100  cefTRIAXone (ROCEPHIN) 1 g in sodium chloride 0.9 % 100 mL IVPB        1 g 200 mL/hr over 30 Minutes Intravenous  Once 02/24/21 0035 02/24/21 0151   02/24/21 0100  metroNIDAZOLE (FLAGYL) IVPB 500 mg        500 mg 100 mL/hr over 60 Minutes Intravenous Every 8 hours 02/24/21 0035     02/23/21 2045  cefTRIAXone (ROCEPHIN) 1 g in sodium chloride 0.9 % 100 mL IVPB        1 g 200 mL/hr over  30 Minutes Intravenous  Once 02/23/21 2039 02/23/21 2131      Assessment/Plan:     Hypertension Pure hypercholesterolemia Excessive drinking alcohol CAD - hold Plavix. Continue ASA perioperatively. Cards has seen - no further pre op work up CHF GERD  Acute cholecystitis - continue ceftriaxone/flagyl  - will need to be off plavix for 4-5 days prior to lap chole - possible sx date 5/28 or 5/29 - pain well controlled   FEN: heart healthy, NPO MN in case of surgery ID: rocephin/falgyl 5/25>> VTE: SCDs, lovenox, hold plavix. Continue ASA  Dispo:  continue antibiotics, hold plavix, LAP CHOLE IN AM   Kyleen Villatoro A Daeja Helderman 02/26/2021

## 2021-02-26 NOTE — Anesthesia Preprocedure Evaluation (Addendum)
Anesthesia Evaluation  Patient identified by MRN, date of birth, ID band Patient awake    Reviewed: Allergy & Precautions, NPO status , Patient's Chart, lab work & pertinent test results, reviewed documented beta blocker date and time   Airway Mallampati: II  TM Distance: >3 FB Neck ROM: Full   Comment: Long thick beard Dental  (+) Poor Dentition, Dental Advisory Given, Missing,    Pulmonary COPD, Current Smoker,  30 pack year history, 3/4ppd currently No inhalers    Pulmonary exam normal breath sounds clear to auscultation       Cardiovascular hypertension, Pt. on medications and Pt. on home beta blockers + CAD, + Past MI, + Cardiac Stents (plavix last dose 5d ago, stent2017) and +CHF (LVEF 00-17%, grade 1 diastolic dysfunction)  Normal cardiovascular exam Rhythm:Regular Rate:Normal  Last echo 2018: - Left ventricle: The cavity size was normal. Wall thickness was  increased in a pattern of mild LVH. Systolic function was mildly  reduced. The estimated ejection fraction was in the range of 45%  to 50%. No mural thrombus with Definity contrast. Distal  anterior, anteroseptal, apical and inferoapical akinesis  suggestive of infarct. Doppler parameters are consistent with  abnormal left ventricular relaxation (grade 1 diastolic  dysfunction). The E/e&' ratio is <8, suggesting normal LV filling  pressure.  - Mitral valve: Mildly thickened leaflets . There was trivial  regurgitation.  - Left atrium: The atrium was normal in size.  - Systemic veins: The IVC measures >2.1 cm, but collapses more than  50%, suggesting an elevated RA pressure of 8 mmHg.   Cath 2017: Coronary angiogram 09/27/2016: LVEF 40% with mid to distal anterior apical and inferoapical akinesis. Mild disease RCA and circumflex. Mid LAD occluded just proximal to the previously placed stent, S/P 3.0 x 22 mm resolute onyx DES, postdilated with 3.5 x  12 [place] at 16 atmospheric, 100% reduced to 0%, D2 stent jailed, balloon antroplasty with 2.0 x 12 mm balloon, 99% reduced to less than 10%, TIMI-3 to TIMI-3 in both vessels.   Neuro/Psych PSYCHIATRIC DISORDERS Anxiety Depression negative neurological ROS     GI/Hepatic GERD  Medicated and Controlled,(+) Cirrhosis  (plt 130)    substance abuse (etOH abuse- 90 drinks/wk (4-5 drinks/d per pt))  alcohol use, Acute cholecystitis  Hx barretts, esophageal stricture S/p partial colectomy 2011   Endo/Other  negative endocrine ROS  Renal/GU negative Renal ROS  negative genitourinary   Musculoskeletal negative musculoskeletal ROS (+)   Abdominal   Peds  Hematology negative hematology ROS (+) hct 37.3, plt 130   Anesthesia Other Findings   Reproductive/Obstetrics negative OB ROS                          Anesthesia Physical Anesthesia Plan  ASA: III  Anesthesia Plan: General   Post-op Pain Management:    Induction: Intravenous  PONV Risk Score and Plan: 3 and Ondansetron, Dexamethasone, Midazolam and Treatment may vary due to age or medical condition  Airway Management Planned: Oral ETT  Additional Equipment: None  Intra-op Plan:   Post-operative Plan: Extubation in OR  Informed Consent: I have reviewed the patients History and Physical, chart, labs and discussed the procedure including the risks, benefits and alternatives for the proposed anesthesia with the patient or authorized representative who has indicated his/her understanding and acceptance.     Dental advisory given  Plan Discussed with: CRNA  Anesthesia Plan Comments:        Anesthesia  Quick Evaluation

## 2021-02-26 NOTE — Progress Notes (Signed)
PROGRESS NOTE  Jeremiah Short DGU:[id number] DOB: [birth year] DOA: 02/23/2021 PCP: Luetta Nutting, DO  Brief History   60 year old man PMH including CAD, CHF, Barrett's esophagus, multiple abdominal surgeries presented with abdominal pain generalized and right upper quadrant.  Admitted for acute cholecystitis.  Hospitalization prolonged by need to wait for Plavix to washout.  Plan for cholecystectomy next 48 hours.  A & P  Acute calculus cholecystitis -- Still has pain but overall appears stable.  Continue empiric antibiotics.  Surgery planned for tomorrow.  CAD status post PCI.  Denies chest pain now although was noted in H&P.Troponins negative.  EKG nonacute. --No further cardiac work-up indicated per cardiology. -- We will continue aspirin.  Resume Plavix when cleared by surgery.  Chronic combined systolic and diastolic CHF, ischemic cardiomyopathy.  April 2018 LVEF 25-30%. --Appears euvolemic.  Remains stable. --Continue losartan and carvedilol.  Consider transition to [facility] as an outpatient.  Essential hypertension -- Stable, continue carvedilol and losartan.  Hyperlipidemia --Continue statin.  Alcohol use --No signs of withdrawal.  Continue alcohol withdrawal protocol.  GERD, Barrett's esophagus --PPI  L1 compression deformity indeterminate age new since 2016.  Patient asymptomatic. --Follow-up with PCP as an outpatient, consider further evaluation   Aortic atherosclerosis --Continue statin  Emphysema --Quiescent.  Recommend stop smoking.  Disposition Plan:  Discussion:   Status is: Inpatient  Remains inpatient appropriate because:IV treatments appropriate due to intensity of illness or inability to take PO and Inpatient level of care appropriate due to severity of illness   Dispo: The patient is from: Home              Anticipated d/c is to: Home              Patient currently is not medically stable to d/c.   Difficult to place patient No  DVT  prophylaxis: SCDs Start: 02/24/21 1131 SCDs Start: 02/24/21 0034   Code Status: Full Code Level of care: Med-Surg Family Communication: none  Murray Hodgkins, MD  [facility] Direct contact: see [email or web address] (further directions at bottom of note if needed) 7PM-7AM contact night coverage as at bottom of note 02/26/2021, 12:21 PM  LOS: 3 days   Significant Hospital Events   . 5/25 admit for acute cholecystitis   Consults:  . General surgery  . Cardiology    Procedures:  .   Significant Diagnostic Tests:  [name]    Micro Data:  .    Antimicrobials:  . Ceftriaxone and Flagyl 5/25 >   Interval History/Subjective  CC: f/u abd pain  Some pain on right side Breathing ok  Objective   Vitals:  Vitals:   02/26/21 0514 02/26/21 0800  BP: 102/72 102/72  Pulse: 93 93  Resp: 16   Temp: 98.2 F (36.8 C)   SpO2: 94%     Exam:  Constitutional:   . Appears calm and comfortable ENMT:  . grossly normal hearing  Respiratory:  . CTA bilaterally, no w/r/r.  . Respiratory effort normal Cardiovascular:  . RRR, no m/r/g Abdomen:  [name] Mild pain w/ palpation  Psychiatric:  . Mental status o Mood, affect appropriate  I have personally reviewed the following:   Today's Data  . CMP stable . K+ 3.3 . LFTs WNK . Plts stable 130 . Hgb down to 12.5, probably dilution/equilibration   Scheduled Meds: . acetaminophen  650 mg Oral Q6H  . aspirin EC  81 mg Oral Daily  . atorvastatin  80 mg Oral q1800  . carvedilol  3.125 mg  Oral BID WC  . folic acid  1 mg Oral Daily  . losartan  50 mg Oral Daily  . multivitamin with minerals  1 tablet Oral Daily  . nicotine  14 mg Transdermal QHS  . thiamine  100 mg Oral Daily   Or  . thiamine  100 mg Intravenous Daily   Continuous Infusions: . cefTRIAXone (ROCEPHIN)  IV 2 g (02/26/21 0006)  . metronidazole 500 mg (02/26/21 0157)    Principal Problem:   Acute cholecystitis Active Problems:   Benign essential HTN   Pure  hypercholesterolemia   Excessive drinking alcohol   CAD (coronary artery disease)   Chronic combined systolic (congestive) and diastolic (congestive) heart failure (HCC)   Aortic atherosclerosis (HCC)   Emphysema lung (HCC)   Closed compression fracture of body of L1 vertebra ([place])   LOS: 3 days   How to contact the [facility] Attending or Consulting provider 7A - 7P or covering provider during after hours [place], for this patient?  1. Check the care team in [facility] and look for a) attending/consulting TR[facility] provider listed and b) the [facility] team listed 2. Log into [email or web address] and use [place]'s universal password to access. If you do not have the password, please contact the hospital operator. 3. Locate the [facility] provider you are looking for under [facility] and page to a number that you can be directly reached. 4. If you still have difficulty reaching the provider, please page the [facility] (Director on Call) for the Hospitalists listed on amion for assistance.

## 2021-02-27 ENCOUNTER — Inpatient Hospital Stay (HOSPITAL_COMMUNITY): Payer: BC Managed Care – PPO | Admitting: Anesthesiology

## 2021-02-27 ENCOUNTER — Encounter (HOSPITAL_COMMUNITY): Payer: Self-pay | Admitting: Internal Medicine

## 2021-02-27 ENCOUNTER — Encounter (HOSPITAL_COMMUNITY)
Admission: EM | Disposition: A | Discharge status: Home / Self Care | Payer: Self-pay | Source: Home / Self Care | Attending: Family Medicine

## 2021-02-27 HISTORY — PX: CHOLECYSTECTOMY: SHX55

## 2021-02-27 LAB — PROTIME-INR
INR: 1.1 (ref 0.8–1.2)
Prothrombin Time: 14.5 seconds (ref 11.4–15.2)

## 2021-02-27 SURGERY — LAPAROSCOPIC CHOLECYSTECTOMY WITH INTRAOPERATIVE CHOLANGIOGRAM
Anesthesia: General | Site: Abdomen

## 2021-02-27 MED ORDER — MIDAZOLAM HCL 5 MG/5ML IJ SOLN
INTRAMUSCULAR | Status: DC | PRN
  Administered 2021-02-27: 2 mg via INTRAVENOUS

## 2021-02-27 MED ORDER — ROCURONIUM BROMIDE 10 MG/ML (PF) SYRINGE
PREFILLED_SYRINGE | INTRAVENOUS | Status: DC | PRN
  Administered 2021-02-27: 10 mg via INTRAVENOUS
  Administered 2021-02-27: 70 mg via INTRAVENOUS
  Administered 2021-02-27: 10 mg via INTRAVENOUS

## 2021-02-27 MED ORDER — ONDANSETRON HCL 4 MG/2ML IJ SOLN: 4.0000 mg | Freq: Once | INTRAMUSCULAR | Status: DC | PRN

## 2021-02-27 MED ORDER — HYDROMORPHONE HCL 1 MG/ML IJ SOLN
1.0000 mg | INTRAMUSCULAR | Status: DC | PRN
  Administered 2021-02-27 – 2021-02-28 (×4): 1 mg via INTRAVENOUS
  Filled 2021-02-27 (×4): qty 1

## 2021-02-27 MED ORDER — LIDOCAINE 2% (20 MG/ML) 5 ML SYRINGE
INTRAMUSCULAR | Status: AC
  Filled 2021-02-27: qty 10

## 2021-02-27 MED ORDER — TISSEEL VH 10 ML EX KIT
PACK | CUTANEOUS | Status: AC
  Filled 2021-02-27: qty 1

## 2021-02-27 MED ORDER — PHENYLEPHRINE 40 MCG/ML (10ML) SYRINGE FOR IV PUSH (FOR BLOOD PRESSURE SUPPORT)
PREFILLED_SYRINGE | INTRAVENOUS | Status: AC
  Filled 2021-02-27: qty 10

## 2021-02-27 MED ORDER — OXYCODONE-ACETAMINOPHEN 5-325 MG PO TABS
1.0000 | ORAL_TABLET | ORAL | Status: DC | PRN
  Administered 2021-02-28 – 2021-03-01 (×4): 1 via ORAL
  Filled 2021-02-27 (×3): qty 1

## 2021-02-27 MED ORDER — HYDROMORPHONE HCL 1 MG/ML IJ SOLN
0.2500 mg | INTRAMUSCULAR | Status: DC | PRN
  Administered 2021-02-27 (×2): 0.5 mg via INTRAVENOUS

## 2021-02-27 MED ORDER — BUPIVACAINE-EPINEPHRINE (PF) 0.25% -1:200000 IJ SOLN
INTRAMUSCULAR | Status: AC
  Filled 2021-02-27: qty 30

## 2021-02-27 MED ORDER — DEXAMETHASONE SODIUM PHOSPHATE 10 MG/ML IJ SOLN
INTRAMUSCULAR | Status: AC
  Filled 2021-02-27: qty 2

## 2021-02-27 MED ORDER — DEXMEDETOMIDINE (PRECEDEX) IN NS 20 MCG/5ML (4 MCG/ML) IV SYRINGE
PREFILLED_SYRINGE | INTRAVENOUS | Status: AC
  Filled 2021-02-27: qty 5

## 2021-02-27 MED ORDER — HYDROMORPHONE HCL 1 MG/ML IJ SOLN
0.5000 mg | Freq: Once | INTRAMUSCULAR | Status: AC
  Administered 2021-02-27: 0.5 mg via INTRAVENOUS
  Filled 2021-02-27: qty 0.5

## 2021-02-27 MED ORDER — MIDAZOLAM HCL 2 MG/2ML IJ SOLN
INTRAMUSCULAR | Status: AC
  Filled 2021-02-27: qty 2

## 2021-02-27 MED ORDER — ALBUMIN HUMAN 5 % IV SOLN: INTRAVENOUS | Status: DC | PRN

## 2021-02-27 MED ORDER — ONDANSETRON HCL 4 MG/2ML IJ SOLN
INTRAMUSCULAR | Status: DC | PRN
  Administered 2021-02-27: 4 mg via INTRAVENOUS

## 2021-02-27 MED ORDER — DEXAMETHASONE SODIUM PHOSPHATE 10 MG/ML IJ SOLN
INTRAMUSCULAR | Status: DC | PRN
  Administered 2021-02-27: 8 mg via INTRAVENOUS

## 2021-02-27 MED ORDER — PROPOFOL 10 MG/ML IV BOLUS
INTRAVENOUS | Status: AC
  Filled 2021-02-27: qty 40

## 2021-02-27 MED ORDER — HYDROMORPHONE HCL 1 MG/ML IJ SOLN
INTRAMUSCULAR | Status: AC
  Filled 2021-02-27: qty 1

## 2021-02-27 MED ORDER — SUGAMMADEX SODIUM 200 MG/2ML IV SOLN
INTRAVENOUS | Status: DC | PRN
  Administered 2021-02-27: 200 mg via INTRAVENOUS

## 2021-02-27 MED ORDER — PHENYLEPHRINE HCL-NACL 10-0.9 MG/250ML-% IV SOLN
INTRAVENOUS | Status: DC | PRN
  Administered 2021-02-27: 25 ug/min via INTRAVENOUS

## 2021-02-27 MED ORDER — ROCURONIUM BROMIDE 10 MG/ML (PF) SYRINGE
PREFILLED_SYRINGE | INTRAVENOUS | Status: AC
  Filled 2021-02-27: qty 20

## 2021-02-27 MED ORDER — ONDANSETRON HCL 4 MG/2ML IJ SOLN
INTRAMUSCULAR | Status: AC
  Filled 2021-02-27: qty 2

## 2021-02-27 MED ORDER — FENTANYL CITRATE (PF) 250 MCG/5ML IJ SOLN
INTRAMUSCULAR | Status: AC
  Filled 2021-02-27: qty 5

## 2021-02-27 MED ORDER — OXYCODONE HCL 5 MG/5ML PO SOLN: 5.0000 mg | Freq: Once | ORAL | Status: DC | PRN

## 2021-02-27 MED ORDER — HYDROMORPHONE HCL 1 MG/ML IJ SOLN: 1.0000 mg | INTRAMUSCULAR | Status: DC | PRN

## 2021-02-27 MED ORDER — PHENYLEPHRINE 40 MCG/ML (10ML) SYRINGE FOR IV PUSH (FOR BLOOD PRESSURE SUPPORT)
PREFILLED_SYRINGE | INTRAVENOUS | Status: DC | PRN
  Administered 2021-02-27 (×2): 40 ug via INTRAVENOUS
  Administered 2021-02-27 (×4): 80 ug via INTRAVENOUS

## 2021-02-27 MED ORDER — DEXMEDETOMIDINE (PRECEDEX) IN NS 20 MCG/5ML (4 MCG/ML) IV SYRINGE
PREFILLED_SYRINGE | INTRAVENOUS | Status: DC | PRN
  Administered 2021-02-27: 4 ug via INTRAVENOUS
  Administered 2021-02-27 (×2): 8 ug via INTRAVENOUS

## 2021-02-27 MED ORDER — BUPIVACAINE-EPINEPHRINE 0.25% -1:200000 IJ SOLN
INTRAMUSCULAR | Status: DC | PRN
  Administered 2021-02-27: 20 mL

## 2021-02-27 MED ORDER — LACTATED RINGERS IR SOLN
Status: DC | PRN
  Administered 2021-02-27: 1000 mL

## 2021-02-27 MED ORDER — FENTANYL CITRATE (PF) 100 MCG/2ML IJ SOLN
INTRAMUSCULAR | Status: DC | PRN
  Administered 2021-02-27 (×5): 50 ug via INTRAVENOUS

## 2021-02-27 MED ORDER — LACTATED RINGERS IV SOLN: INTRAVENOUS | Status: DC | PRN

## 2021-02-27 MED ORDER — LIDOCAINE 2% (20 MG/ML) 5 ML SYRINGE
INTRAMUSCULAR | Status: DC | PRN
  Administered 2021-02-27: 60 mg via INTRAVENOUS

## 2021-02-27 MED ORDER — ALBUMIN HUMAN 5 % IV SOLN
INTRAVENOUS | Status: AC
  Filled 2021-02-27: qty 250

## 2021-02-27 MED ORDER — OXYCODONE HCL 5 MG PO TABS: 5.0000 mg | ORAL_TABLET | Freq: Once | ORAL | Status: DC | PRN

## 2021-02-27 MED ORDER — PHENYLEPHRINE HCL (PRESSORS) 10 MG/ML IV SOLN
INTRAVENOUS | Status: AC
  Filled 2021-02-27: qty 1

## 2021-02-27 MED ORDER — EPHEDRINE 5 MG/ML INJ
INTRAVENOUS | Status: AC
  Filled 2021-02-27: qty 10

## 2021-02-27 MED ORDER — ESMOLOL HCL 100 MG/10ML IV SOLN
INTRAVENOUS | Status: AC
  Filled 2021-02-27: qty 10

## 2021-02-27 MED ORDER — PROPOFOL 10 MG/ML IV BOLUS
INTRAVENOUS | Status: DC | PRN
  Administered 2021-02-27: 100 mg via INTRAVENOUS

## 2021-02-27 MED ORDER — ESMOLOL HCL 100 MG/10ML IV SOLN
INTRAVENOUS | Status: DC | PRN
  Administered 2021-02-27: 10 mg via INTRAVENOUS

## 2021-02-27 SURGICAL SUPPLY — 53 items
ADH SKN CLS APL DERMABOND .7 (GAUZE/BANDAGES/DRESSINGS) ×1
APL PRP STRL LF DISP 70% ISPRP (MISCELLANEOUS) ×1
APL SRG 38 LTWT LNG FL B (MISCELLANEOUS) ×1
APPLICATOR ARISTA FLEXITIP XL (MISCELLANEOUS) ×1 IMPLANT
APPLIER CLIP 5 13 M/L LIGAMAX5 (MISCELLANEOUS) ×2
APPLIER CLIP ROT 10 11.4 M/L (STAPLE) ×2
APR CLP MED LRG 11.4X10 (STAPLE) ×1
APR CLP MED LRG 5 ANG JAW (MISCELLANEOUS) ×1
BAG SPEC RTRVL LRG 6X4 10 (ENDOMECHANICALS) ×1
CHLORAPREP W/TINT 26 (MISCELLANEOUS) ×2 IMPLANT
CLIP APPLIE 5 13 M/L LIGAMAX5 (MISCELLANEOUS) IMPLANT
CLIP APPLIE ROT 10 11.4 M/L (STAPLE) ×1 IMPLANT
COVER MAYO STAND STRL (DRAPES) ×2 IMPLANT
COVER WAND RF STERILE (DRAPES) IMPLANT
DECANTER SPIKE VIAL GLASS SM (MISCELLANEOUS) ×2 IMPLANT
DERMABOND ADVANCED (GAUZE/BANDAGES/DRESSINGS) ×1
DERMABOND ADVANCED .7 DNX12 (GAUZE/BANDAGES/DRESSINGS) IMPLANT
DRAIN CHANNEL 19F RND (DRAIN) ×1 IMPLANT
DRAPE C-ARM 42X120 X-RAY (DRAPES) ×2 IMPLANT
DRAPE UTILITY XL STRL (DRAPES) ×2 IMPLANT
DRAPE WARM FLUID 44X44 (DRAPES) ×2 IMPLANT
DRSG TEGADERM 4X4.75 (GAUZE/BANDAGES/DRESSINGS) ×1 IMPLANT
ELECT REM PT RETURN 15FT ADLT (MISCELLANEOUS) ×2 IMPLANT
EVACUATOR SILICONE 100CC (DRAIN) ×1 IMPLANT
GAUZE SPONGE 2X2 8PLY STRL LF (GAUZE/BANDAGES/DRESSINGS) IMPLANT
GLOVE SURG MICRO LTX SZ7.5 (GLOVE) ×2 IMPLANT
GLOVE SURG UNDER LTX SZ8 (GLOVE) ×2 IMPLANT
GOWN STRL REUS W/TWL XL LVL3 (GOWN DISPOSABLE) ×4 IMPLANT
GRASPER SUT TROCAR 14GX15 (MISCELLANEOUS) ×1 IMPLANT
HEMOSTAT ARISTA ABSORB 3G PWDR (HEMOSTASIS) ×1 IMPLANT
HEMOSTAT SURGICEL 4X8 (HEMOSTASIS) IMPLANT
KIT BASIN OR (CUSTOM PROCEDURE TRAY) ×2 IMPLANT
KIT TURNOVER KIT A (KITS) ×2 IMPLANT
PENCIL SMOKE EVACUATOR (MISCELLANEOUS) IMPLANT
POUCH SPECIMEN RETRIEVAL 10MM (ENDOMECHANICALS) ×2 IMPLANT
PROTECTOR NERVE ULNAR (MISCELLANEOUS) IMPLANT
SCISSORS LAP 5X35 DISP (ENDOMECHANICALS) IMPLANT
SET CHOLANGIOGRAPH MIX (MISCELLANEOUS) ×2 IMPLANT
SET IRRIG TUBING LAPAROSCOPIC (IRRIGATION / IRRIGATOR) ×2 IMPLANT
SET TUBE SMOKE EVAC HIGH FLOW (TUBING) IMPLANT
SLEEVE XCEL OPT CAN 5 100 (ENDOMECHANICALS) ×3 IMPLANT
SPONGE GAUZE 2X2 STER 10/PKG (GAUZE/BANDAGES/DRESSINGS) ×1
SUT ETHILON 2 0 PS N (SUTURE) ×1 IMPLANT
SUT MNCRL AB 4-0 PS2 18 (SUTURE) ×2 IMPLANT
SUT VICRYL 0 TIES 12 18 (SUTURE) ×1 IMPLANT
TAPE CLOTH 4X10 WHT NS (GAUZE/BANDAGES/DRESSINGS) IMPLANT
TOWEL OR 17X26 10 PK STRL BLUE (TOWEL DISPOSABLE) ×2 IMPLANT
TOWEL OR NON WOVEN STRL DISP B (DISPOSABLE) ×2 IMPLANT
TRAY LAPAROSCOPIC (CUSTOM PROCEDURE TRAY) ×2 IMPLANT
TROCAR BLADELESS OPT 5 100 (ENDOMECHANICALS) ×2 IMPLANT
TROCAR XCEL BLUNT TIP 100MML (ENDOMECHANICALS) ×2 IMPLANT
TROCAR XCEL NON-BLD 11X100MML (ENDOMECHANICALS) ×2 IMPLANT
TROCAR XCEL UNIV SLVE 11M 100M (ENDOMECHANICALS) ×1 IMPLANT

## 2021-02-27 NOTE — Anesthesia Procedure Notes (Signed)
Procedure Name: Intubation Date/Time: 02/27/2021 7:52 AM Performed by: Lavina Hamman, CRNA Pre-anesthesia Checklist: Patient identified, Emergency Drugs available, Suction available, Patient being monitored and Timeout performed Patient Re-evaluated:Patient Re-evaluated prior to induction Oxygen Delivery Method: Circle system utilized Preoxygenation: Pre-oxygenation with 100% oxygen Induction Type: IV induction Ventilation: Mask ventilation without difficulty and Oral airway inserted - appropriate to patient size Laryngoscope Size: Mac and 4 Grade View: Grade II Tube type: Oral Tube size: 7.5 mm Number of attempts: 1 Airway Equipment and Method: Stylet Placement Confirmation: ETT inserted through vocal cords under direct vision,  positive ETCO2,  CO2 detector and breath sounds checked- equal and bilateral Secured at: 23 cm Tube secured with: Tape Dental Injury: Teeth and Oropharynx as per pre-operative assessment  Comments: ATOI

## 2021-02-27 NOTE — Interval H&P Note (Signed)
History and Physical Interval Note:  02/27/2021 7:23 AM  Jeremiah Short  has presented today for surgery, with the diagnosis of ACUTE CHOLECYSTITIS.  The various methods of treatment have been discussed with the patient and family. After consideration of risks, benefits and other options for treatment, the patient has consented to  Procedure(s): LAPAROSCOPIC CHOLECYSTECTOMY WITH INTRAOPERATIVE CHOLANGIOGRAM (N/A) as a surgical intervention.  The patient's history has been reviewed, patient examined, no change in status, stable for surgery.  I have reviewed the patient's chart and labs.  Questions were answered to the patient's satisfaction.     Turner Daniels MD  The procedure has been discussed with the patient. Operative and non operative treatments have been discussed. Risks of surgery include bleeding, infection,  Common bile duct injury,  Injury to the stomach,liver, colon,small intestine, abdominal wall,  Diaphragm,  Major blood vessels,  And the need for an open procedure.  Other risks include worsening of medical problems, death,  DVT and pulmonary embolism, and cardiovascular events.   Medical options have also been discussed. The patient has been informed of long term expectations of surgery and non surgical options,  The patient agrees to proceed.

## 2021-02-27 NOTE — Transfer of Care (Signed)
Immediate Anesthesia Transfer of Care Note  Patient: Jeremiah Short  Procedure(s) Performed: Procedure(s): LAPAROSCOPIC CHOLECYSTECTOMY (N/A)  Patient Location: PACU  Anesthesia Type:General  Level of Consciousness:  sedated, patient cooperative and responds to stimulation  Airway & Oxygen Therapy:Patient Spontanous Breathing and Patient connected to face mask oxgen  Post-op Assessment:  Report given to PACU RN and Post -op Vital signs reviewed and stable  Post vital signs:  Reviewed and stable  Last Vitals:  Vitals:   02/27/21 0601 02/27/21 0949  BP: 120/88 (!) 128/98  Pulse: 96 (!) 122  Resp: 18 (!) 23  Temp: 36.7 C 36.7 C  SpO2: 15% 18%    Complications: No apparent anesthesia complications

## 2021-02-27 NOTE — Op Note (Signed)
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  He was on Plavix and required cardiac clearance.  These were performed and his Plavix was allowed to wear off.  We discussed options of percutaneous drainage versus cholecystectomy.  Pros and cons of each were discussed as well as long-term expectations.  He had no interest in drainage and understood that he may require an open procedure and/or partial cholecystectomy.  This would entail he may require an ERCP depending on findings.  We discussed all this preoperatively.  He agreed to proceed with surgery.The procedure has been discussed with the patient. Operative and non operative treatments have been discussed. Risks of surgery include bleeding, infection,  Common bile duct injury,  Injury to the stomach,liver, colon,small intestine, abdominal wall,  Diaphragm,  Major blood vessels,  And the need for an open procedure.  Other risks include worsening of medical problems, death,  DVT and pulmonary embolism, and cardiovascular events.   Medical options have also been discussed. The patient has been informed of long term expectations of surgery and non surgical options,  The patient agrees to proceed.      Description of procedure: The patient was met in the holding area.  All questions were answered.  He was taken back to the operating room placed supine upon the OR table.  After induction of general anesthesia, his abdomen was prepped and draped  in sterile fashion and timeout performed.  Proper patient, site and procedure were verified.  He had received appropriate antibiotics.  He had a previous midline laparotomy from a sigmoid colon resection.  I opted to place a subxiphoid 5 mm Optiview port.  Small incision was made and the port was advanced under direct imaging guidance.  Upon entering abdominal cavity pneumoperitoneum was created to 15 mmHg of CO2 pressure.  Laparoscope was placed.  No evidence of bowel injury or organ injury with insertion of the trocar.  He had some lower midline adhesions.  I opted to place an 11 mm port to the patient's right of the umbilicus away from the adhesions under direct vision.  I then placed 2 5 mm ports in the right upper quadrant both under direct vision.  He is placed in reverse Trendelenburg.  He was rolled to his left.  He had dense adhesions of his colon and duodenum to the body of the gallbladder.  I was able to find the dome of the gallbladder and decompress it with a needle.  This made manipulation better but it was thickened to about 3 cm.  It was quite friable and severely inflamed.  Once the dome was grasped I could then carefully push the colon away from the body of the gallbladder.  This was done very carefully.  The colon was inspected and no injury was noted.  I was then able to retract the gallbladder toward the right shoulder.  A second grasper was used to grab the mid body.  The lower part of the mid body and infundibulum was fused down to the duodenum into the toward the porta  hepatis.  I was able to carefully dissect as far as I could safely do this but given the amount of chronic woody thick inflammation, I did not feel it was safe to proceed further.  The gallbladder at this point was necrotic and during manipulation and separated.  Large gallstone was noted.  I then resected the upper two thirds of the gallbladder with the cautery.  This was placed in an Endo Catch bag along with a large  gallstone and pulled out through the subxiphoid port and passed off the field.  The trochars were placed.  Upon inspection there is the lower third of the gallbladder remaining.  I carefully examined this and again saw no plane that I could safely dissect through without concern for injury of the duodenum or common bile duct since this was fused.  There was a trace amount of bile leakage and I could see the orifice for the cystic duct.  I irrigated out the area quite copiously.  Hemostasis was achieved with cautery.  I then proceeded to fulgurate the remainder of the gallbladder with cautery under direct vision.  This was done to control bleeding but also destroyed the mucosa with the hope this would involute over time.  This was done for about 20 minutes.  Care was taken to avoid injury to any neighboring structures.  Once this was achieved, I saw no further bile leakage.  Arista was placed for hemostasis around the subcutaneous tissues.  A large piece of the omentum was then placed into the gallbladder fossa remnant with hope this with scarring to place and reduce the chance and/or severity of a leak.  Through the most lateral port site 19 round drain was placed in the gallbladder fossa and secured to the skin with 2-0 nylon.  Four-quadrant laparoscopy revealed no evidence of bowel injury or other intra-abdominal injury.  At this point time all ports were removed.  The 8 to 11 mm trocar sites were closed with a's fascial stitch of 0 Vicryl.  4-0 Monocryl used to close skin.  All counts were found to be correct.  Dermabond placed.  Patient awoke extubated taken to recovery in satisfactory condition.

## 2021-02-27 NOTE — Progress Notes (Signed)
PROGRESS NOTE  Jeremiah Short [id number]:[id number] DOB: 09-13-1961 DOA: 02/23/2021 PCP: Luetta Nutting, DO  Brief History   60 year old man PMH including CAD, CHF, Barrett's esophagus, multiple abdominal surgeries presented with abdominal pain generalized and right upper quadrant.  Admitted for acute cholecystitis.  Hospitalization prolonged by need to wait for Plavix to washout.  Plan for cholecystectomy next 48 hours.  A & P  Acute calculus cholecystitis -- Still has pain but overall appears stable.  Continue empiric antibiotics.  Surgery planned for tomorrow.  CAD status post PCI.  Denies chest pain now although was noted in H&P.Troponins negative.  EKG nonacute. --No further cardiac work-up indicated per cardiology. -- We will continue aspirin.  Resume Plavix when cleared by surgery.  Chronic combined systolic and diastolic CHF, ischemic cardiomyopathy.  April 2018 LVEF 25-30%. --Appears euvolemic.  Remains stable. --Continue losartan and carvedilol.  Consider transition to [facility] as an outpatient.  Essential hypertension -- Stable, continue carvedilol and losartan.  Hyperlipidemia --Continue statin.  Alcohol use --No signs of withdrawal.  Continue alcohol withdrawal protocol.  GERD, Barrett's esophagus --PPI  L1 compression deformity indeterminate age new since 2016.  Patient asymptomatic. --Follow-up with PCP as an outpatient, consider further evaluation   Aortic atherosclerosis --Continue statin  Emphysema --Quiescent.  Recommend stop smoking.  Disposition Plan:  Discussion:   Status is: Inpatient  Remains inpatient appropriate because:IV treatments appropriate due to intensity of illness or inability to take PO and Inpatient level of care appropriate due to severity of illness   Dispo: The patient is from: Home              Anticipated d/c is to: Home              Patient currently is not medically stable to d/c.   Difficult to place patient No  DVT  prophylaxis: SCDs Start: 02/24/21 1131 SCDs Start: 02/24/21 0034   Code Status: Full Code Level of care: Med-Surg Family Communication: none  Murray Hodgkins, MD  [facility] Direct contact: see [email or web address] (further directions at bottom of note if needed) 7PM-7AM contact night coverage as at bottom of note 02/27/2021, 3:49 PM  LOS: 4 days   Significant Hospital Events   . 5/25 admit for acute cholecystitis   Consults:  . General surgery  . Cardiology    Procedures:   Subtotal cholecystectomy with drain placement  Significant Diagnostic Tests:  [name]    Micro Data:  . 5/26 BC NGTD   Antimicrobials:  . Ceftriaxone and Flagyl 5/25 >   Interval History/Subjective  CC: f/u abd pain  Seen postoperatively, having a lot of pain  Objective   Vitals:  Vitals:   02/27/21 1239 02/27/21 1345  BP: (!) 131/96 (!) 141/95  Pulse: 94 90  Resp: 18 18  Temp: 97.6 F (36.4 C) 97.8 F (36.6 C)  SpO2: 95% 97%    Exam:  Constitutional:   . Appears calm, uncomfortable, nontoxic ENMT:  . grossly normal hearing  Respiratory:  . CTA bilaterally, no w/r/r.  . Respiratory effort normal.  Cardiovascular:  . RRR, no m/r/g Psychiatric:  . Mental status o Mood, affect appropriate  I have personally reviewed the following:   Today's Data  . INR 1.1  Scheduled Meds: . acetaminophen  650 mg Oral Q6H  . aspirin EC  81 mg Oral Daily  . atorvastatin  80 mg Oral q1800  . carvedilol  3.125 mg Oral BID WC  . folic acid  1 mg Oral Daily  .  HYDROmorphone      .  HYDROmorphone (DILAUDID) injection  0.5 mg Intravenous Once  . losartan  50 mg Oral Daily  . multivitamin with minerals  1 tablet Oral Daily  . nicotine  14 mg Transdermal QHS  . thiamine  100 mg Oral Daily   Or  . thiamine  100 mg Intravenous Daily   Continuous Infusions: . cefTRIAXone (ROCEPHIN)  IV 2 g (02/26/21 2334)  . metronidazole 500 mg (02/27/21 0022)    Principal Problem:   Acute cholecystitis Active  Problems:   Benign essential HTN   Pure hypercholesterolemia   Excessive drinking alcohol   CAD (coronary artery disease)   Chronic combined systolic (congestive) and diastolic (congestive) heart failure (HCC)   Aortic atherosclerosis (HCC)   Emphysema lung (HCC)   Closed compression fracture of body of L1 vertebra ([place])   LOS: 4 days   How to contact the [facility] Attending or Consulting provider 7A - 7P or covering provider during after hours [place], for this patient?  1. Check the care team in [facility] and look for a) attending/consulting TRH provider listed and b) the [facility] team listed 2. Log into [email or web address] and use [place]'s universal password to access. If you do not have the password, please contact the hospital operator. 3. Locate the [facility] provider you are looking for under [facility] and page to a number that you can be directly reached. 4. If you still have difficulty reaching the provider, please page the [facility] (Director on Call) for the Hospitalists listed on amion for assistance.

## 2021-02-27 NOTE — Anesthesia Postprocedure Evaluation (Signed)
Anesthesia Post Note  Patient: Jeremiah Short  Procedure(s) Performed: LAPAROSCOPIC CHOLECYSTECTOMY (N/A Abdomen)     Patient location during evaluation: PACU Anesthesia Type: General Level of consciousness: awake and alert, oriented and patient cooperative Pain management: pain level controlled Vital Signs Assessment: post-procedure vital signs reviewed and stable Respiratory status: spontaneous breathing, nonlabored ventilation and respiratory function stable Cardiovascular status: blood pressure returned to baseline and stable Postop Assessment: no apparent nausea or vomiting Anesthetic complications: no   No complications documented.  Last Vitals:  Vitals:   02/27/21 1045 02/27/21 1144  BP: (!) 145/106 (!) 127/102  Pulse: (!) 106 95  Resp: 18 18  Temp: (!) 36.3 C 36.8 C  SpO2: 98% 93%    Last Pain:  Vitals:   02/27/21 1045  TempSrc: Oral  PainSc: [place]

## 2021-02-28 ENCOUNTER — Other Ambulatory Visit: Payer: Self-pay | Admitting: Family Medicine

## 2021-02-28 ENCOUNTER — Encounter (HOSPITAL_COMMUNITY): Payer: Self-pay | Admitting: Surgery

## 2021-02-28 LAB — COMPREHENSIVE METABOLIC PANEL
ALT: 21 U/L (ref 0–44)
AST: 32 U/L (ref 15–41)
Albumin: 3.2 g/dL — ABNORMAL LOW (ref 3.5–5.0)
Alkaline Phosphatase: 52 U/L (ref 38–126)
Anion gap: 8 (ref 5–15)
BUN: 16 mg/dL (ref 8–23)
CO2: 30 mmol/L (ref 22–32)
Calcium: 9 mg/dL (ref 8.9–10.3)
Chloride: 100 mmol/L (ref 98–111)
Creatinine, Ser: 0.85 mg/dL (ref 0.61–1.24)
GFR, Estimated: >60 mL/min (ref >60)
Glucose, Bld: 121 mg/dL — ABNORMAL HIGH (ref 70–99)
Potassium: 3.9 mmol/L (ref 3.5–5.1)
Sodium: 138 mmol/L (ref 135–145)
Total Bilirubin: 0.3 mg/dL (ref 0.3–1.2)
Total Protein: 6.7 g/dL (ref 6.5–8.1)

## 2021-02-28 MED ORDER — MULTIVITAMINS PO CAPS: 1.0000 | ORAL_CAPSULE | Freq: Every day | ORAL | Status: DC

## 2021-02-28 MED ORDER — BISMUTH SUBSALICYLATE 262 MG/15ML PO SUSP
30.0000 mL | Freq: Four times a day (QID) | ORAL | Status: DC | PRN
  Filled 2021-02-28: qty 236

## 2021-02-28 MED ORDER — NITROGLYCERIN 0.4 MG SL SUBL: 0.4000 mg | SUBLINGUAL_TABLET | SUBLINGUAL | Status: DC | PRN

## 2021-02-28 MED ORDER — BUSPIRONE HCL 5 MG PO TABS
10.0000 mg | ORAL_TABLET | Freq: Two times a day (BID) | ORAL | Status: DC
  Administered 2021-02-28 – 2021-03-01 (×2): 10 mg via ORAL
  Filled 2021-02-28 (×2): qty 2

## 2021-02-28 MED ORDER — LOPERAMIDE HCL 2 MG PO CAPS: 2.0000 mg | ORAL_CAPSULE | ORAL | Status: DC | PRN

## 2021-02-28 MED ORDER — METHOCARBAMOL 500 MG PO TABS
500.0000 mg | ORAL_TABLET | Freq: Four times a day (QID) | ORAL | Status: DC | PRN
  Administered 2021-02-28 – 2021-03-01 (×2): 500 mg via ORAL
  Filled 2021-02-28 (×2): qty 1

## 2021-02-28 MED ORDER — ISOSORBIDE MONONITRATE ER 60 MG PO TB24
60.0000 mg | ORAL_TABLET | Freq: Every day | ORAL | Status: DC
  Administered 2021-03-01: 60 mg via ORAL
  Filled 2021-02-28: qty 1

## 2021-02-28 MED ORDER — VENLAFAXINE HCL ER 75 MG PO CP24
225.0000 mg | ORAL_CAPSULE | Freq: Every day | ORAL | Status: DC
  Administered 2021-02-28 – 2021-03-01 (×2): 225 mg via ORAL
  Filled 2021-02-28 (×2): qty 1

## 2021-02-28 MED ORDER — PANTOPRAZOLE SODIUM 40 MG PO TBEC
40.0000 mg | DELAYED_RELEASE_TABLET | Freq: Every day | ORAL | Status: DC
  Administered 2021-02-28 – 2021-03-01 (×2): 40 mg via ORAL
  Filled 2021-02-28 (×2): qty 1

## 2021-02-28 NOTE — Progress Notes (Signed)
1 Day Post-Op   Subjective/Chief Complaint: Complaining of muscle spasms.   Objective: Vital signs in last 24 hours: Temp:  [97.4 F (36.3 C)-98.4 F (36.9 C)] 97.8 F (36.6 C) (05/30 0443) Pulse Rate:  [90-122] 101 (05/30 0443) Resp:  [17-23] 18 (05/30 0443) BP: (117-146)/(83-106) 123/83 (05/30 0443) SpO2:  [93 %-99 %] 94 % (05/30 0443) Last BM Date: 02/26/21  Intake/Output from previous day: 05/29 0701 - 05/30 0700 In: 4211.5 [P.O.:2640; I.V.:1000; IV Piggyback:571.5] Out: 2423 [Urine:3550; Drains:195; Blood:150] Intake/Output this shift: Total I/O In: 240 [P.O.:240] Out: -   Incision/Wound: Incisions clean dry intact.  JP is serosanguineous with no bile.  Lab Results:  Recent Labs    02/26/21 0920  WBC 7.4  HGB 12.5*  HCT 37.3*  PLT 130*   BMET Recent Labs    02/26/21 0920 02/28/21 0446  NA 134* 138  K 3.3* 3.9  CL 101 100  CO2 28 30  GLUCOSE 100* 121*  BUN 28* 16  CREATININE 0.89 0.85  CALCIUM 8.6* 9.0   PT/INR Recent Labs    02/27/21 1116  LABPROT 14.5  INR 1.1   ABG No results for input(s): PHART, HCO3 in the last 72 hours.  Invalid input(s): PCO2, PO2  Studies/Results: No results found.  Anti-infectives: Anti-infectives (From admission, onward)   Start     Dose/Rate Route Frequency Ordered Stop   02/27/21 0600  ceFAZolin (ANCEF) IVPB 2g/100 mL premix  Status:  Discontinued        2 g 200 mL/hr over 30 Minutes Intravenous On call to O.R. 02/26/21 1844 02/27/21 1037   02/25/21 0000  cefTRIAXone (ROCEPHIN) 2 g in sodium chloride 0.9 % 100 mL IVPB        2 g 200 mL/hr over 30 Minutes Intravenous Every 24 hours 02/24/21 0035     02/24/21 0100  cefTRIAXone (ROCEPHIN) 1 g in sodium chloride 0.9 % 100 mL IVPB        1 g 200 mL/hr over 30 Minutes Intravenous  Once 02/24/21 0035 02/24/21 0151   02/24/21 0100  metroNIDAZOLE (FLAGYL) IVPB 500 mg        500 mg 100 mL/hr over 60 Minutes Intravenous Every 8 hours 02/24/21 0035     02/23/21  2045  cefTRIAXone (ROCEPHIN) 1 g in sodium chloride 0.9 % 100 mL IVPB        1 g 200 mL/hr over 30 Minutes Intravenous  Once 02/23/21 2039 02/23/21 2131      Assessment/Plan: s/p Procedure(s): LAPAROSCOPIC CHOLECYSTECTOMY (N/A) Status post partial cholecystectomy [place]  Syndrome  Continue drain  He will need to go home with the drain  Robaxin for muscle spasms  Plan for discharge tomorrow depending on drain output and consistency.  High risk for bile leak but I think if this appears serosanguineous tomorrow he can go home with his drain and follow-up as an outpatient for this.  I have explained this to him today.  Continue IV antibiotics for now but these can be stopped at discharge  LOS: 5 days    Jeremiah Short 02/28/2021

## 2021-02-28 NOTE — Progress Notes (Signed)
PROGRESS NOTE  Jeremiah Short WCH:[id number] DOB: Jul 07, 1961 DOA: 02/23/2021 PCP: Luetta Nutting, DO  Brief History   60 year old man PMH including CAD, CHF, Barrett's esophagus, multiple abdominal surgeries presented with abdominal pain generalized and right upper quadrant.  Admitted for acute cholecystitis.  Hospitalization prolonged by need to wait for Plavix to washout.  Status post subtotal cholecystectomy 5/29.  A & P  Acute calculus cholecystitis, status post subtotal cholecystectomy 5/29 with drain placement. -- Better today although still having pain.  Surgery anticipates discharge home tomorrow.  Drain output and consistency.  High risk for bile leak.  Continue antibiotics until discharge.  CAD status post PCI.  Denies chest pain now although was noted in H&P.Troponins negative.  EKG nonacute. --No further cardiac work-up indicated per cardiology. -- Continue aspirin.  Resume Plavix when cleared by surgery.  Chronic combined systolic and diastolic CHF, ischemic cardiomyopathy.  April 2018 LVEF 25-30%. -- Remains stable. --Continue losartan and carvedilol.  Consider transition to [facility] as an outpatient.  Essential hypertension -- Stable.  Continue carvedilol and losartan.  Hyperlipidemia --Continue statin.  Alcohol use --No signs of withdrawal.  Continue alcohol withdrawal protocol.  GERD, Barrett's esophagus --PPI  L1 compression deformity indeterminate age new since 2016.  Patient asymptomatic. --Follow-up with PCP as an outpatient, consider further evaluation   Aortic atherosclerosis --Continue statin  Emphysema --Quiescent.  Recommend stop smoking.  Disposition Plan:  Discussion:   Status is: Inpatient  Remains inpatient appropriate because:IV treatments appropriate due to intensity of illness or inability to take PO and Inpatient level of care appropriate due to severity of illness   Dispo: The patient is from: Home              Anticipated d/c is to:  Home              Patient currently is not medically stable to d/c.   Difficult to place patient No  DVT prophylaxis: SCDs Start: 02/24/21 1131 SCDs Start: 02/24/21 0034   Code Status: Full Code Level of care: Med-Surg Family Communication: none  Murray Hodgkins, MD  [facility] Direct contact: see [email or web address] (further directions at bottom of note if needed) 7PM-7AM contact night coverage as at bottom of note 02/28/2021, 2:56 PM  LOS: 5 days   Significant Hospital Events   . 5/25 admit for acute cholecystitis   Consults:  . General surgery  . Cardiology    Procedures:   Subtotal cholecystectomy with drain placement  Significant Diagnostic Tests:  [name]    Micro Data:  . 5/26 BC NGTD   Antimicrobials:  . Ceftriaxone and Flagyl 5/25 >   Interval History/Subjective  CC: f/u abd pain  Feels better, but still having pain.  Objective   Vitals:  Vitals:   02/28/21 0917 02/28/21 1337  BP: 103/77 98/69  Pulse: (!) 101 99  Resp: 18 17  Temp: 98.4 F (36.9 C) 97.7 F (36.5 C)  SpO2: 97% 95%    Exam:  Constitutional:   . Appears cal, more comfortable today ENMT:  . grossly normal hearing  Respiratory:  . CTA bilaterally, no w/r/r.  . Respiratory effort normal. Cardiovascular:  . RRR, no m/r/g Abdomen:  . Appears unremarkable. Incisions closed Psychiatric:  . Mental status o Mood, affect appropriate  I have personally reviewed the following:   Today's Data  . CMP noted  Scheduled Meds: . acetaminophen  650 mg Oral Q6H  . aspirin EC  81 mg Oral Daily  . atorvastatin  80 mg Oral  q1800  . carvedilol  3.125 mg Oral BID WC  . folic acid  1 mg Oral Daily  . losartan  50 mg Oral Daily  . multivitamin with minerals  1 tablet Oral Daily  . nicotine  14 mg Transdermal QHS  . thiamine  100 mg Oral Daily   Or  . thiamine  100 mg Intravenous Daily   Continuous Infusions: . cefTRIAXone (ROCEPHIN)  IV 2 g (02/27/21 2358)  . metronidazole 500 mg  (02/28/21 5993)    Principal Problem:   Acute cholecystitis Active Problems:   Benign essential HTN   Pure hypercholesterolemia   Excessive drinking alcohol   CAD (coronary artery disease)   Chronic combined systolic (congestive) and diastolic (congestive) heart failure (HCC)   Aortic atherosclerosis (HCC)   Emphysema lung (HCC)   Closed compression fracture of body of L1 vertebra ([place])   LOS: 5 days   How to contact the [facility] Attending or Consulting provider 7A - 7P or covering provider during after hours [place], for this patient?  1. Check the care team in [facility] and look for a) attending/consulting TRH provider listed and b) the [facility] team listed 2. Log into [email or web address] and use [place]'s universal password to access. If you do not have the password, please contact the hospital operator. 3. Locate the [facility] provider you are looking for under [facility] and page to a number that you can be directly reached. 4. If you still have difficulty reaching the provider, please page the [facility] (Director on Call) for the Hospitalists listed on amion for assistance.

## 2021-02-28 NOTE — Progress Notes (Signed)
[facility] paged because patient was asking for home medications to be ordered.

## 2021-03-01 LAB — SURGICAL PATHOLOGY

## 2021-03-01 LAB — COMPREHENSIVE METABOLIC PANEL
ALT: 15 U/L (ref 0–44)
AST: 19 U/L (ref 15–41)
Albumin: 2.7 g/dL — ABNORMAL LOW (ref 3.5–5.0)
Alkaline Phosphatase: 47 U/L (ref 38–126)
Anion gap: 7 (ref 5–15)
BUN: 12 mg/dL (ref 8–23)
CO2: 30 mmol/L (ref 22–32)
Calcium: 8.8 mg/dL — ABNORMAL LOW (ref 8.9–10.3)
Chloride: 101 mmol/L (ref 98–111)
Creatinine, Ser: 0.71 mg/dL (ref 0.61–1.24)
GFR, Estimated: >60 mL/min (ref >60)
Glucose, Bld: 142 mg/dL — ABNORMAL HIGH (ref 70–99)
Potassium: 3.7 mmol/L (ref 3.5–5.1)
Sodium: 138 mmol/L (ref 135–145)
Total Bilirubin: 0.2 mg/dL — ABNORMAL LOW (ref 0.3–1.2)
Total Protein: 5.7 g/dL — ABNORMAL LOW (ref 6.5–8.1)

## 2021-03-01 LAB — CULTURE, BLOOD (ROUTINE X 2)
Culture: NO GROWTH
Culture: NO GROWTH
Special Requests: ADEQUATE
Special Requests: ADEQUATE

## 2021-03-01 MED ORDER — CLOPIDOGREL BISULFATE 75 MG PO TABS
75.0000 mg | ORAL_TABLET | Freq: Every day | ORAL | Status: DC
  Administered 2021-03-01: 75 mg via ORAL
  Filled 2021-03-01: qty 1

## 2021-03-01 MED ORDER — THIAMINE HCL 100 MG PO TABS: 100.0000 mg | ORAL_TABLET | Freq: Every day | ORAL | Status: AC

## 2021-03-01 MED ORDER — ACETAMINOPHEN 325 MG PO TABS: 650.0000 mg | ORAL_TABLET | Freq: Four times a day (QID) | ORAL | 0 refills | Status: AC | PRN

## 2021-03-01 MED ORDER — FOLIC ACID 1 MG PO TABS: 1.0000 mg | ORAL_TABLET | Freq: Every day | ORAL | Status: DC

## 2021-03-01 MED ORDER — SENNOSIDES-DOCUSATE SODIUM 8.6-50 MG PO TABS
1.0000 | ORAL_TABLET | Freq: Every day | ORAL | Status: DC
  Administered 2021-03-01: 1 via ORAL
  Filled 2021-03-01: qty 1

## 2021-03-01 MED ORDER — SENNOSIDES-DOCUSATE SODIUM 8.6-50 MG PO TABS: 1.0000 | ORAL_TABLET | Freq: Every evening | ORAL | 0 refills | Status: AC | PRN

## 2021-03-01 MED ORDER — CARVEDILOL 3.125 MG PO TABS: 3.1250 mg | ORAL_TABLET | Freq: Two times a day (BID) | ORAL | Status: DC

## 2021-03-01 MED ORDER — AMOXICILLIN-POT CLAVULANATE 875-125 MG PO TABS: 1.0000 | ORAL_TABLET | Freq: Two times a day (BID) | ORAL | 0 refills | Status: AC

## 2021-03-01 MED ORDER — OXYCODONE HCL 5 MG PO TABS: 5.0000 mg | ORAL_TABLET | ORAL | 0 refills | Status: AC | PRN

## 2021-03-01 NOTE — Plan of Care (Signed)

## 2021-03-01 NOTE — Discharge Summary (Signed)
Physician Discharge Summary  Jeremiah Short FAO:[id number] DOB: 1961-06-29 DOA: 02/23/2021  PCP: Luetta Nutting, DO  Admit date: 02/23/2021 Discharge date: 03/01/2021  Recommendations for Outpatient Follow-up:   Acute calculus cholecystitis, status post subtotal cholecystectomy 5/29 with drain placement. --outpatient follow-up with surgery Dr. [name] in 1 to 2 weeks. -- Complete 5 days of Augmentin  Chronic combined systolic and diastolic CHF, ischemic cardiomyopathy.  April 2018 LVEF 25-30%. --Continue losartan and carvedilol.  Consider transition to [facility] as an outpatient.  L1 compression deformity indeterminate age new since 2016.  Patient asymptomatic. --Follow-up with PCP as an outpatient, consider further evaluation     Follow-up Information    Cornett, Marcello Moores, MD Follow up.   Specialty: General Surgery Why: follow up in clinic on 03/11/21 at 10:30 am Please arrive 30 minutes prior to complete paperwork and bring photo ID and insurance card Contact information: [place] [address] [phone]                Discharge Diagnoses: Principal diagnosis is #1 Principal Problem:   Acute cholecystitis Active Problems:   Benign essential HTN   Pure hypercholesterolemia   Excessive drinking alcohol   CAD (coronary artery disease)   Chronic combined systolic (congestive) and diastolic (congestive) heart failure (HCC)   Aortic atherosclerosis ([place])   Emphysema lung ([place])   Closed compression fracture of body of L1 vertebra ([place])   Discharge Condition: improved Disposition: home  Diet recommendation:  Diet Orders (From admission, onward)    Start     Ordered   03/01/21 0000  Diet - low sodium heart healthy        03/01/21 1219   02/27/21 1049  Diet regular Room service appropriate? Yes; Fluid consistency: Thin  Diet effective now       Question Answer Comment  Room service appropriate? Yes   Fluid consistency: Thin      02/27/21  1048           Filed Weights   02/23/21 1623  Weight: 68 kg    HPI/Hospital Course:   60 year old man PMH including CAD, CHF, Barrett's esophagus, multiple abdominal surgeries presented with abdominal pain generalized and right upper quadrant.  Admitted for acute cholecystitis.  Hospitalization prolonged by need to wait for Plavix to washout.  Status post subtotal cholecystectomy 5/29.  Postoperative course uncomplicated.  Home with drain today.  Outpatient follow-up with surgery.  Acute calculus cholecystitis, status post subtotal cholecystectomy 5/29 with drain placement. --Continues to improve.  Cleared for discharge by surgery today. Outpatient follow-up with surgery Dr. [name] in 1 to 2 weeks. -- Complete 5 days of Augmentin  CAD status post PCI.  Denies chest pain now although was noted in H&P.Troponins negative.  EKG nonacute. --No further cardiac work-up indicated per cardiology. -- Continue aspirin.  Resume Plavix on discharge.  Chronic combined systolic and diastolic CHF, ischemic cardiomyopathy.  April 2018 LVEF 25-30%. --Continue losartan and carvedilol.  Consider transition to [facility] as an outpatient.  Essential hypertension -- Stable.  Continue carvedilol and losartan.  Hyperlipidemia --Continue statin.  Alcohol use --No signs of withdrawal.  Continue alcohol withdrawal protocol.  GERD, Barrett's esophagus --PPI  L1 compression deformity indeterminate age new since 2016.  Patient asymptomatic. --Follow-up with PCP as an outpatient, consider further evaluation   Aortic atherosclerosis --Continue statin  Emphysema --Quiescent.  Recommend stop smoking.   Consults:   General surgery   Cardiology   Procedures:   Subtotal cholecystectomy with drain placement  Micro Data:   5/26 BC NG/F  Antimicrobials:   Ceftriaxone and Flagyl 5/25 >   Augmentin 5 days  Today's assessment: S: CC: f/u abd pain  Feels better  O: Vitals:  Vitals:    02/28/21 2112 03/01/21 0511  BP: 116/79 103/82  Pulse: 98 86  Resp: 17 17  Temp: 98 F (36.7 C) 97.6 F (36.4 C)  SpO2: 99% 95%    Constitutional:  . Appears calm and comfortable Respiratory:  . CTA bilaterally, no w/r/r.  . Respiratory effort normal.  Cardiovascular:  . RRR, no m/r/g Psychiatric:  . jMental status o Mood, affect appropriate  CMP stable  Discharge Instructions  Discharge Instructions    Diet - low sodium heart healthy   Complete by: As directed    Discharge instructions   Complete by: As directed    Call your physician or seek immediate medical attention for pain, bleeding, swelling, or worsening of condition. Drain care per Dr. Josetta Huddle office.   Discharge wound care:   Complete by: As directed    Keep wounds clean and dry, do not submerge. Further instructions per Dr. Josetta Huddle office.   Increase activity slowly   Complete by: As directed      Allergies as of 03/01/2021      Reactions   Morphine And Related Other (See Comments)   dont remember the symptoms. Has tolerated dilaudid and oxycodone      Medication List    STOP taking these medications   B-D HYPODERMIC NEEDLE 22GX1" 22G X 1" Misc Generic drug: NEEDLE (DISP) 22 G     TAKE these medications   acetaminophen 325 MG tablet Commonly known as: TYLENOL Take 2 tablets (650 mg total) by mouth every 6 (six) hours as needed for up to 5 days for mild pain or moderate pain.   amoxicillin-clavulanate 875-125 MG tablet Commonly known as: Augmentin Take 1 tablet by mouth 2 (two) times daily for 5 days.   aspirin 81 MG EC tablet Take 1 tablet (81 mg total) by mouth daily.   atorvastatin 80 MG tablet Commonly known as: LIPITOR TAKE 1 TABLET (80 MG TOTAL) BY MOUTH DAILY AT 6 PM.   bismuth subsalicylate 742 VZ/56LO suspension Commonly known as: PEPTO BISMOL Take 30 mLs by mouth every 6 (six) hours as needed for indigestion or diarrhea or loose stools.   busPIRone 10 MG  tablet Commonly known as: BUSPAR Take 1 tablet (10 mg total) by mouth 2 (two) times daily.   carvedilol 3.125 MG tablet Commonly known as: COREG Take 1 tablet (3.125 mg total) by mouth 2 (two) times daily with a meal. What changed:   medication strength  how much to take  when to take this   clopidogrel 75 MG tablet Commonly known as: PLAVIX TAKE 1 TABLET BY MOUTH DAILY.   folic acid 1 MG tablet Commonly known as: FOLVITE Take 1 tablet (1 mg total) by mouth daily. Start taking on: March 02, 2021   isosorbide mononitrate 60 MG 24 hr tablet Commonly known as: IMDUR TAKE 1 TABLET BY MOUTH EVERY DAY   lansoprazole 30 MG capsule Commonly known as: PREVACID Take 1 capsule (30 mg total) by mouth daily before breakfast.   loperamide 2 MG capsule Commonly known as: IMODIUM Take 2 mg by mouth as needed for diarrhea or loose stools.   losartan 50 MG tablet Commonly known as: COZAAR Take 1 tablet (50 mg total) by mouth daily. Please make yearly appt with Dr. [name] for August  2022 for future refills. Thank you 1st attempt   multivitamin capsule Take 1 capsule by mouth daily.   nitroGLYCERIN 0.4 MG SL tablet Commonly known as: NITROSTAT PLACE 1 TABLET UNDER THE TONGUE EVERY 5 MINUTES FOR 3 DOSES AS NEEDED FOR CHEST PAIN What changed:   how much to take  how to take this  when to take this  reasons to take this  additional instructions   oxyCODONE 5 MG immediate release tablet Commonly known as: Oxy IR/ROXICODONE Take 1 tablet (5 mg total) by mouth every 4 (four) hours as needed for up to 3 days for moderate pain or severe pain.   senna-docusate 8.6-50 MG tablet Commonly known as: Senokot-S Take 1 tablet by mouth at bedtime as needed for up to 5 days for mild constipation or moderate constipation.   thiamine 100 MG tablet Take 1 tablet (100 mg total) by mouth daily. Start taking on: March 02, 2021   venlafaxine XR 75 MG 24 hr capsule Commonly known as:  EFFEXOR-XR Take 3 capsules (225 mg total) by mouth daily with breakfast.            Discharge Care Instructions  (From admission, onward)         Start     Ordered   03/01/21 0000  Discharge wound care:       Comments: Keep wounds clean and dry, do not submerge. Further instructions per Dr. Josetta Huddle office.   03/01/21 1219         Allergies  Allergen Reactions  . Morphine And Related Other (See Comments)    dont remember the symptoms. Has tolerated dilaudid and oxycodone    The results of significant diagnostics from this hospitalization (including imaging, microbiology, ancillary and laboratory) are listed below for reference.    Significant Diagnostic Studies: CT Abdomen Pelvis Wo Contrast  Result Date: 02/23/2021 CLINICAL DATA:  Bilateral lower quadrant abdominal pain. History of diverticulitis with prior repair. EXAM: CT ABDOMEN AND PELVIS WITHOUT CONTRAST TECHNIQUE: Multidetector CT imaging of the abdomen and pelvis was performed following the standard protocol without IV contrast. COMPARISON:  05/10/2020 abdominal ultrasound. Most recent CT of 10/07/2014 FINDINGS: Lower chest: Mild centrilobular emphysema with bibasilar nonspecific interstitial thickening. Normal heart size without pericardial or pleural effusion. Distal esophageal wall thickening is again identified, mild-to-moderate on 02/03. Hepatobiliary: Normal noncontrast appearance of the liver. A gallstone of 1.8 cm. Pericholecystic edema is mild to moderate on 26/3. Mild gallbladder wall thickening at up to 5 mm. No biliary duct dilatation. Pancreas: Mild pancreatic atrophy, without duct dilatation or acute inflammation. Spleen: Normal in size, without focal abnormality. Adrenals/Urinary Tract: Normal adrenal glands. Mild renal cortical thinning bilaterally. No renal calculi or hydronephrosis. No bladder calculi. Stomach/Bowel: Normal stomach, without wall thickening. Descending duodenal diverticulum. Otherwise normal  small bowel. Extensive colonic diverticulosis. Vascular/Lymphatic: Aortic atherosclerosis. No abdominopelvic adenopathy. Reproductive: Normal prostate. Other: No significant free fluid. No free intraperitoneal air. Fat containing diminutive ventral abdominal wall hernia. Musculoskeletal: Degenerate disc disease at the lumbosacral junction. Superior endplate compression deformity at L1 results in approximately 30% vertebral body height loss anteriorly. No ventral canal encroachment. New since 10/17/2014. IMPRESSION: 1. Cholelithiasis with gallbladder wall thickening and pericholecystic edema, highly suspicious for acute cholecystitis. 2. No other explanation for patient's symptoms. 3. Distal esophageal wall thickening, suggesting esophagitis. 4. L1 compression deformity, new since 2016. 5. Aortic Atherosclerosis (ICD10-I70.0) and Emphysema (ICD10-J43.9). Electronically Signed   By: Abigail Miyamoto M.D.   On: 02/23/2021 18:25   US Abdomen Limited  Result Date: 02/23/2021 CLINICAL DATA:  Right upper quadrant pain EXAM: ULTRASOUND ABDOMEN LIMITED RIGHT UPPER QUADRANT COMPARISON:  Ultrasound 05/10/2020, CT 02/23/2021 FINDINGS: Gallbladder: Gallbladder contains multiple echogenic, shadowing gallstones and tumefactive appearing adherent biliary sludge. Largest gallstone measures up to 2.5 cm in diameter. Markedly thickened gallbladder wall with edematous changes and some trace fluid gallbladder fossa. Sonographic Percell Miller sign is reportedly negative. Common bile duct: Diameter: 5 mm, nondilated Liver: Mild heterogeneity of hepatic parenchyma. Smooth surface contour. No concerning focal liver lesion. Portal vein is patent on color Doppler imaging with normal direction of blood flow towards the liver. Other: None. IMPRESSION: Mild hepatic heterogeneity, nonspecific but can be seen in the setting of intrinsic liver disease. Gallbladder containing admixed shadowing gallstones and biliary sludge. Marked gallbladder wall  thickening and pericholecystic fluid. While sonographic Percell Miller sign is reportedly negative, this may be spuriously absent due to analgesic medications noted in patient's chart. Particularly given the CT appearance, findings remain suspicious for an acute cholecystitis though some degree of gallbladder wall thickening may be present in the setting of intrinsic liver disease. Electronically Signed   By: Lovena Le M.D.   On: 02/23/2021 19:58    Microbiology: Recent Results (from the past 240 hour(s))  Resp Panel by RT-PCR (Flu A&B, Covid) Nasopharyngeal Swab     Status: None   Collection Time: 02/23/21  8:53 PM   Specimen: Nasopharyngeal Swab; Nasopharyngeal(NP) swabs in vial transport medium  Result Value Ref Range Status   SARS Coronavirus 2 by RT PCR NEGATIVE NEGATIVE Final    Comment: (NOTE) SARS-CoV-2 target nucleic acids are NOT DETECTED.  The SARS-CoV-2 RNA is generally detectable in upper respiratory specimens during the acute phase of infection. The lowest concentration of SARS-CoV-2 viral copies this assay can detect is 138 copies/mL. A negative result does not preclude SARS-Cov-2 infection and should not be used as the sole basis for treatment or other patient management decisions. A negative result may occur with  improper specimen collection/handling, submission of specimen other than nasopharyngeal swab, presence of viral mutation(s) within the areas targeted by this assay, and inadequate number of viral copies(<138 copies/mL). A negative result must be combined with clinical observations, patient history, and epidemiological information. The expected result is Negative.  Fact Sheet for Patients:  EntrepreneurPulse.com.au  Fact Sheet for Healthcare Providers:  IncredibleEmployment.be  This test is no t yet approved or cleared by the [place] FDA and  has been authorized for detection and/or diagnosis of SARS-CoV-2 by FDA under an  Emergency Use Authorization (EUA). This EUA will remain  in effect (meaning this test can be used) for the duration of the COVID-19 declaration under Section 564(b)(1) of the Act, 21 U.S.C.section 360bbb-3(b)(1), unless the authorization is terminated  or revoked sooner.       Influenza A by PCR NEGATIVE NEGATIVE Final   Influenza B by PCR NEGATIVE NEGATIVE Final    Comment: (NOTE) The Xpert Xpress SARS-CoV-2/FLU/RSV plus assay is intended as an aid in the diagnosis of influenza from Nasopharyngeal swab specimens and should not be used as a sole basis for treatment. Nasal washings and aspirates are unacceptable for Xpert Xpress SARS-CoV-2/FLU/RSV testing.  Fact Sheet for Patients: EntrepreneurPulse.com.au  Fact Sheet for Healthcare Providers: IncredibleEmployment.be  This test is not yet approved or cleared by the [place] FDA and has been authorized for detection and/or diagnosis of SARS-CoV-2 by FDA under an Emergency Use Authorization (EUA). This EUA will remain in effect (meaning this test can be used) for the  duration of the COVID-19 declaration under Section 564(b)(1) of the Act, 21 U.S.C. section 360bbb-3(b)(1), unless the authorization is terminated or revoked.  Performed at [facility], [address] [place], [place] [address]   Culture, blood (routine x 2)     Status: None   Collection Time: 02/24/21  1:41 AM   Specimen: BLOOD RIGHT HAND  Result Value Ref Range Status   Specimen Description   Final    BLOOD RIGHT HAND Performed at [facility], [place] [address] [place], [place] [address]    Special Requests   Final    BOTTLES DRAWN AEROBIC ONLY Blood Culture adequate volume Performed at [place] [address] [place], [place] [address]    Culture   Final    NO GROWTH 5 DAYS Performed at [facility], [place] [address], [place], [place] [address]    Report Status  03/01/2021 FINAL  Final  Culture, blood (routine x 2)     Status: None   Collection Time: 02/24/21  1:41 AM   Specimen: BLOOD RIGHT ARM  Result Value Ref Range Status   Specimen Description   Final    BLOOD RIGHT ARM Performed at [facility], [place] [address], [place], [place] [address]    Special Requests   Final    BOTTLES DRAWN AEROBIC ONLY Blood Culture adequate volume Performed at [place] [address], [place], [place] [address]    Culture   Final    NO GROWTH 5 DAYS Performed at [facility], [place] [address], [place], [place] [address]    Report Status 03/01/2021 FINAL  Final  MRSA PCR Screening     Status: None   Collection Time: 02/24/21  3:20 AM   Specimen: Nasal Mucosa; Nasopharyngeal  Result Value Ref Range Status   MRSA by PCR NEGATIVE NEGATIVE Final    Comment:        The GeneXpert MRSA Assay (FDA approved for NASAL specimens only), is one component of a comprehensive MRSA colonization surveillance program. It is not intended to diagnose MRSA infection nor to guide or monitor treatment for MRSA infections. Performed at [facility], [place] [address] [place], [place] [address]      Labs: Basic Metabolic Panel: Recent Labs  Lab 02/23/21 1635 02/24/21 0413 02/26/21 0920 02/28/21 0446 03/01/21 0025  NA 133* 136 134* 138 138  K 3.7 3.8 3.3* 3.9 3.7  CL 96* 100 101 100 101  CO2 26 23 28 30 30   GLUCOSE 155* 119* 100* 121* 142*  BUN 13 14 28* 16 12  CREATININE 0.76 0.60* 0.89 0.85 0.71  CALCIUM 9.7 9.3 8.6* 9.0 8.8*  MG  --  1.8  --   --   --   PHOS  --  3.8  --   --   --    Liver Function Tests: Recent Labs  Lab 02/23/21 1635 02/24/21 0413 02/26/21 0920 02/28/21 0446 03/01/21 0025  AST 28 19 18  32 19  ALT 24 19 13 21 15   ALKPHOS 70 63 46 52 47  BILITOT 1.1 0.8 0.5 0.3 0.2*  PROT 8.0 7.4 5.9* 6.7 5.7*  ALBUMIN 4.2 3.8 2.8* 3.2* 2.7*   Recent Labs  Lab 02/23/21 1635  LIPASE 25    CBC: Recent Labs  Lab 02/23/21 1635 02/24/21 0413 02/26/21 0920  WBC 8.3 8.1 7.4  NEUTROABS 5.7  --   --  HGB 16.8 16.2 12.5*  HCT 48.6 47.9 37.3*  MCV 99.2 99.4 101.1*  PLT 162 140* 130*    Principal Problem:   Acute cholecystitis Active Problems:   Benign essential HTN   Pure hypercholesterolemia   Excessive drinking alcohol   CAD (coronary artery disease)   Chronic combined systolic (congestive) and diastolic (congestive) heart failure (HCC)   Aortic atherosclerosis (HCC)   Emphysema lung (HCC)   Closed compression fracture of body of L1 vertebra ([place])   Time coordinating discharge: 25 minutes  Signed:  Murray Hodgkins, MD  [facility]  03/01/2021, 12:20 PM

## 2021-03-01 NOTE — Progress Notes (Signed)
Progress Note  2 Days Post-Op  Subjective: CC: some abdominal pain but has not needed pain medications yet today. He is tolerating diet without nausea/emesis and passing flatus. Ambulating. He denies respiratory complaints  Objective: Vital signs in last 24 hours: Temp:  [97.6 F (36.4 C)-98.4 F (36.9 C)] 97.6 F (36.4 C) (05/31 0511) Pulse Rate:  [86-101] 86 (05/31 0511) Resp:  [17-18] 17 (05/31 0511) BP: (98-116)/(69-82) 103/82 (05/31 0511) SpO2:  [95 %-99 %] 95 % (05/31 0511) Last BM Date: 02/26/21  Intake/Output from previous day: 05/30 0701 - 05/31 0700 In: 2480 [P.O.:1980; IV Piggyback:500] Out: 2340 [Urine:2250; Drains:90] Intake/Output this shift: Total I/O In: 240 [P.O.:240] Out: -   PE: General: pleasant, WD,male who is standing in room in NAD HEENT: head is normocephalic, atraumatic.  Mouth is pink and moist Heart: regular, rate, and rhythm. Palpable radial and pedal pulses bilaterally Lungs: CTAB, no wheezes, rhonchi, or rales noted.  Respiratory effort nonlabored Abd: soft, ND, +BS. Mildly tender to palpation - greatest around midline incision. There is localized swelling below midline incision without erythema, ecchymosis, discharge. Incisions with glue intact. JP drain with serosanguinous output - no bile MS: all 4 extremities are symmetrical with no cyanosis, clubbing, or edema. Skin: warm and dry with no masses, lesions, or rashes Psych: A&Ox3 with an appropriate affect.    Lab Results:  Recent Labs    02/26/21 0920  WBC 7.4  HGB 12.5*  HCT 37.3*  PLT 130*   BMET Recent Labs    02/28/21 0446 03/01/21 0025  NA 138 138  K 3.9 3.7  CL 100 101  CO2 30 30  GLUCOSE 121* 142*  BUN 16 12  CREATININE 0.85 0.71  CALCIUM 9.0 8.8*   PT/INR Recent Labs    02/27/21 1116  LABPROT 14.5  INR 1.1   CMP     Component Value Date/Time   NA 138 03/01/2021 0025   NA 140 05/13/2018 0852   K 3.7 03/01/2021 0025   CL 101 03/01/2021 0025   CO2 30  03/01/2021 0025   GLUCOSE 142 (H) 03/01/2021 0025   BUN 12 03/01/2021 0025   BUN 22 05/13/2018 0852   CREATININE 0.71 03/01/2021 0025   CREATININE 0.99 12/31/2019 0857   CALCIUM 8.8 (L) 03/01/2021 0025   PROT 5.7 (L) 03/01/2021 0025   PROT 6.0 07/17/2017 1449   ALBUMIN 2.7 (L) 03/01/2021 0025   ALBUMIN 4.1 07/17/2017 1449   AST 19 03/01/2021 0025   ALT 15 03/01/2021 0025   ALKPHOS 47 03/01/2021 0025   BILITOT 0.2 (L) 03/01/2021 0025   BILITOT 0.4 07/17/2017 1449   GFRNONAA >60 03/01/2021 0025   GFRNONAA 83 12/31/2019 0857   GFRAA 96 12/31/2019 0857   Lipase     Component Value Date/Time   LIPASE 25 02/23/2021 1635       Studies/Results: No results found.  Anti-infectives: Anti-infectives (From admission, onward)   Start     Dose/Rate Route Frequency Ordered Stop   02/27/21 0600  ceFAZolin (ANCEF) IVPB 2g/100 mL premix  Status:  Discontinued        2 g 200 mL/hr over 30 Minutes Intravenous On call to O.R. 02/26/21 1844 02/27/21 1037   02/25/21 0000  cefTRIAXone (ROCEPHIN) 2 g in sodium chloride 0.9 % 100 mL IVPB        2 g 200 mL/hr over 30 Minutes Intravenous Every 24 hours 02/24/21 0035     02/24/21 0100  cefTRIAXone (ROCEPHIN) 1 g in sodium  chloride 0.9 % 100 mL IVPB        1 g 200 mL/hr over 30 Minutes Intravenous  Once 02/24/21 0035 02/24/21 0151   02/24/21 0100  metroNIDAZOLE (FLAGYL) IVPB 500 mg        500 mg 100 mL/hr over 60 Minutes Intravenous Every 8 hours 02/24/21 0035     02/23/21 2045  cefTRIAXone (ROCEPHIN) 1 g in sodium chloride 0.9 % 100 mL IVPB        1 g 200 mL/hr over 30 Minutes Intravenous  Once 02/23/21 2039 02/23/21 2131       Assessment/Plan Hypertension Pure hypercholesterolemia Excessive drinking alcohol CAD - Continue ASA perioperatively. Cards has seen - no further pre op work up. Plavix held preop - can resume today CHF GERD  Acute cholecystitis - POD 2 subtotal lap chole with drain placement Dr [name] 5/29 - JP drain - 90  mL serosanguinous  FEN: heart healthy ID: [email or web address] 5/25>> VTE: SCDs, lovenox. Continue ASA. Resume plavix today  Dispo: stable for discharge from a surgical perspective with JP drain in place and follow up with Dr. [name] in 1-2 weeks. Discharge on 5 days augementin   LOS: 6 days    Winferd Humphrey, Eyan [facility] 03/01/2021, 8:34 AM Please see Amion for pager number during day hours 7:00am-4:30pm

## 2021-03-01 NOTE — Discharge Instructions (Signed)
[facility] ______[facility] LAPAROSCOPIC SURGERY: POST OP INSTRUCTIONS Always review your discharge instruction sheet given to you by the facility where your surgery was performed. IF YOU HAVE DISABILITY OR FAMILY LEAVE FORMS, YOU MUST BRING THEM TO THE OFFICE FOR PROCESSING.   DO NOT GIVE THEM TO YOUR DOCTOR.  1. A prescription for pain medication may be given to you upon discharge.  Take your pain medication as prescribed, if needed.  If narcotic pain medicine is not needed, then you may take acetaminophen (Tylenol) or ibuprofen (Advil) as needed - do not start using ibuprofen until 2 days from discharge 2. Take your usually prescribed medications unless otherwise directed. 3. If you need a refill on your pain medication, please contact your pharmacy.  They will contact our office to request authorization. Prescriptions will not be filled after 5pm or on week-ends. 4. You should follow a light diet the first few days after arrival home, such as soup and crackers, etc.  Be sure to include lots of fluids daily. 5. Most patients will experience some swelling and bruising in the area of the incisions.  Ice packs will help.  Swelling and bruising can take several days to resolve.  6. It is common to experience some constipation if taking pain medication after surgery.  Increasing fluid intake and taking a stool softener (such as Colace) will usually help or prevent this problem from occurring.  A mild laxative (Milk of Magnesia or Miralax) should be taken according to package instructions if there are no bowel movements after 48 hours. 7. Unless discharge instructions indicate otherwise, you may remove your bandages 24-48 hours after surgery, and you may shower at that time.  You may have steri-strips (small skin tapes) in place directly over the incision.  These strips should be left on the skin for 7-10 days.  If your surgeon used skin glue on the incision, you may shower in 24 hours.  The  glue will flake off over the next 2-3 weeks.  Any sutures or staples will be removed at the office during your follow-up visit. 8. ACTIVITIES:  You may resume regular (light) daily activities beginning the next day--such as daily self-care, walking, climbing stairs--gradually increasing activities as tolerated.  You may have sexual intercourse when it is comfortable.  Refrain from any heavy lifting or straining until approved by your doctor. No lifting greater than 10 pounds for 2 weeks from surgery. a. You may drive when you are no longer taking prescription pain medication, you can comfortably wear a seatbelt, and you can safely maneuver your car and apply brakes. b. RETURN TO WORK:  _in 1 week._________________________________________________________ 9. You should see your doctor in the office for a follow-up appointment approximately 2-3 weeks after your surgery.  Make sure that you call for this appointment within a day or two after you arrive home to insure a convenient appointment time. 10. OTHER INSTRUCTIONS: ___Monitor your bowel function and incision sites. If you are having worsening constipation, nausea, vomiting please call. please call for worsening swelling, discharge, or spreading redness at incision sites. Monitor your drain output and track daily. You can change the dressing around the site 1-2 times a day. NO soaking or bathing until drain is removed - you may shower. Do not scrub surgery sites. __________________________________________________________________________________________________________________________________________________________________________________________________________________________________ WHEN TO CALL YOUR DOCTOR: 1. Fever over 101.0 2. Inability to urinate 3. Continued bleeding from incision. 4. Increased pain, redness, or drainage from the incision. 5. Increasing abdominal pain  The clinic  staff is available to answer your questions during regular  business hours.  Please don't hesitate to call and ask to speak to one of the nurses for clinical concerns.  If you have a medical emergency, go to the nearest emergency room or call 911.  A surgeon from [facility] is always on call at the hospital. [address] [place], [place], [place]  [address] ? [address], [place], [place]   [id number] [phone] ? [phone] ? FAX [phone] Web site: [email or web address]       Surgical Drain Record             Empty your surgical drain as told by your health care provider. Use this form to write down the amount of fluid that has collected in the drainage container. Please note color of drainage. Bring this form with you to your follow-up visits. Surgical drain #1 location: _right lower abdomen__________________ Date __________ Time __________ Amount __________ Date __________ Time __________ Amount __________ Date __________ Time __________ Amount __________ Date __________ Time __________ Amount __________ Date __________ Time __________ Amount __________ Date __________ Time __________ Amount __________ Date __________ Time __________ Amount __________ Date __________ Time __________ Amount __________ Date __________ Time __________ Amount __________ Date __________ Time __________ Amount __________ Date __________ Time __________ Amount __________ Date __________ Time __________ Amount __________ Date __________ Time __________ Amount __________ Date __________ Time __________ Amount __________ Date __________ Time __________ Amount __________ Date __________ Time __________ Amount __________ Date __________ Time __________ Amount __________ Date __________ Time __________ Amount __________ Date __________ Time __________ Amount __________ Date __________ Time __________ Amount __________ Date __________ Time __________ Amount __________  This information is not intended to replace advice  given to you by your health care provider. Make sure you discuss any questions you have with your health care provider. Document Revised: 11/03/2019 Document Reviewed: 06/25/2017 Elsevier Patient Education  2021 [employer].

## 2021-03-02 ENCOUNTER — Telehealth: Payer: Self-pay | Admitting: General Practice

## 2021-03-02 NOTE — Telephone Encounter (Signed)
Transition Care Management Unsuccessful Follow-up Telephone Call  Date of discharge and from where:  [facility] 03/01/21  Attempts:  1st Attempt  Reason for unsuccessful TCM follow-up call:  Left voice message

## 2021-03-03 NOTE — Telephone Encounter (Signed)
Transition Care Management Unsuccessful Follow-up Telephone Call  Date of discharge and from where:  [facility] 03/01/21  Attempts:  2nd Attempt  Reason for unsuccessful TCM follow-up call:  Left voice message

## 2021-03-07 NOTE — Telephone Encounter (Signed)
Transition Care Management Unsuccessful Follow-up Telephone Call  Date of discharge and from where:  [facility] 03/01/21  Attempts:  3rd Attempt  Reason for unsuccessful TCM follow-up call:  Left voice message

## 2021-04-02 ENCOUNTER — Other Ambulatory Visit: Payer: Self-pay | Admitting: Cardiology

## 2021-04-02 DIAGNOSIS — I2510 Atherosclerotic heart disease of native coronary artery without angina pectoris: Secondary | ICD-10-CM

## 2021-04-02 DIAGNOSIS — R0789 Other chest pain: Secondary | ICD-10-CM

## 2021-04-21 ENCOUNTER — Other Ambulatory Visit: Payer: Self-pay | Admitting: Family Medicine

## 2021-04-30 ENCOUNTER — Other Ambulatory Visit: Payer: Self-pay | Admitting: Family Medicine

## 2021-04-30 ENCOUNTER — Other Ambulatory Visit: Payer: Self-pay | Admitting: Cardiology

## 2021-05-14 ENCOUNTER — Other Ambulatory Visit: Payer: Self-pay | Admitting: Cardiology

## 2021-05-14 DIAGNOSIS — I2510 Atherosclerotic heart disease of native coronary artery without angina pectoris: Secondary | ICD-10-CM

## 2021-05-14 DIAGNOSIS — R0789 Other chest pain: Secondary | ICD-10-CM

## 2021-05-31 ENCOUNTER — Other Ambulatory Visit: Payer: Self-pay | Admitting: Family Medicine

## 2021-05-31 ENCOUNTER — Other Ambulatory Visit: Payer: Self-pay | Admitting: Cardiology

## 2021-05-31 DIAGNOSIS — I2510 Atherosclerotic heart disease of native coronary artery without angina pectoris: Secondary | ICD-10-CM

## 2021-05-31 DIAGNOSIS — R0789 Other chest pain: Secondary | ICD-10-CM

## 2021-06-07 ENCOUNTER — Ambulatory Visit (INDEPENDENT_AMBULATORY_CARE_PROVIDER_SITE_OTHER): Payer: BC Managed Care – PPO | Admitting: Family Medicine

## 2021-06-07 ENCOUNTER — Encounter: Payer: Self-pay | Admitting: Family Medicine

## 2021-06-07 ENCOUNTER — Telehealth: Payer: Self-pay

## 2021-06-07 VITALS — BP 105/74 | Temp 97.8°F | Ht 70.0 in | Wt 161.1 lb

## 2021-06-07 DIAGNOSIS — E7800 Pure hypercholesterolemia, unspecified: Secondary | ICD-10-CM | POA: Diagnosis not present

## 2021-06-07 DIAGNOSIS — Z23 Encounter for immunization: Secondary | ICD-10-CM

## 2021-06-07 DIAGNOSIS — F17210 Nicotine dependence, cigarettes, uncomplicated: Secondary | ICD-10-CM

## 2021-06-07 DIAGNOSIS — F1010 Alcohol abuse, uncomplicated: Secondary | ICD-10-CM | POA: Diagnosis not present

## 2021-06-07 DIAGNOSIS — F331 Major depressive disorder, recurrent, moderate: Secondary | ICD-10-CM

## 2021-06-07 MED ORDER — VENLAFAXINE HCL ER 75 MG PO CP24: 75.0000 mg | ORAL_CAPSULE | Freq: Every day | ORAL | 1 refills | Status: DC

## 2021-06-07 MED ORDER — BUSPIRONE HCL 15 MG PO TABS: 15.0000 mg | ORAL_TABLET | Freq: Two times a day (BID) | ORAL | 3 refills | Status: DC

## 2021-06-07 MED ORDER — ATORVASTATIN CALCIUM 40 MG PO TABS: 40.0000 mg | ORAL_TABLET | Freq: Every day | ORAL | 1 refills | Status: DC

## 2021-06-07 MED ORDER — VENLAFAXINE HCL ER 150 MG PO CP24: 150.0000 mg | ORAL_CAPSULE | Freq: Every day | ORAL | 1 refills | Status: DC

## 2021-06-07 NOTE — Progress Notes (Signed)
Jeremiah Short - 59 y.o. male MRN [id number]  Date of birth: Nov 19, 1960  Subjective Chief Complaint  Patient presents with   M.D.D.    HPI Jeremiah Short is a 60 year old male here today for follow-up of depression and anxiety.  He continues to have diminished energy levels.  He feels like this affects his mood to some degree.  He does feel like Effexor has been helpful for his depressive symptoms.  He has not been able to tell a whole lot of difference in his anxiety with BuSpar at current strength.  He does continue to consume alcohol regularly.  He did have recent labs completed through his employer for his cholesterol.  LDL levels are 39.  He is taking high-dose atorvastatin.  No issues with tolerance at this time.  ROS:  A comprehensive ROS was completed and negative except as noted per HPI  Allergies  Allergen Reactions   Morphine And Related Other (See Comments)    dont remember the symptoms. Has tolerated dilaudid and oxycodone    Past Medical History:  Diagnosis Date   Anxiety    Aortic atherosclerosis ([place]) 02/24/2021   Barrett's esophagus    Cardiomyopathy in other diseases classified elsewhere    Closed compression fracture of body of L1 vertebra ([place]) 02/24/2021   Coronary artery disease    Depression    Diverticulosis    Emphysema lung ([place]) 02/24/2021   Emphysema, unspecified ([place])    Esophageal stricture    GERD (gastroesophageal reflux disease)    History of ETOH abuse    Hypertension    Myocardial infarction [facility]) 2009   Pure hypercholesterolemia    Smoker    Stented coronary artery    Tubular adenoma of colon 2012   Wears glasses     Past Surgical History:  Procedure Laterality Date   APPENDECTOMY  2011   during colectomy   CARDIAC CATHETERIZATION  2009   placed 2 stents   CARDIAC CATHETERIZATION N/A 09/27/2016   Procedure: Left Heart Cath and Coronary Angiography;  Surgeon: [name], MD;  Location: [facility];  Service: Cardiovascular;  Laterality:  N/A;   CARDIAC CATHETERIZATION N/A 09/27/2016   Procedure: Coronary Stent Intervention;  Surgeon: [name], MD;  Location: [facility];  Service: Cardiovascular;  Laterality: N/A;   CHOLECYSTECTOMY N/A 02/27/2021   Procedure: LAPAROSCOPIC CHOLECYSTECTOMY;  Surgeon: Erroll Luna, MD;  Location: [facility];  Service: General;  Laterality: N/A;   CORONARY STENT PLACEMENT     HERNIA REPAIR     INGUINAL HERNIA REPAIR Left 09/16/2013   Procedure: HERNIA REPAIR INGUINAL ADULT;  Surgeon: [name], MD;  Location: [facility];  Service: General;  Laterality: Left;   MOUTH SURGERY     PARTIAL COLECTOMY  2011   sigmoid   TONSILLECTOMY     WISDOM TOOTH EXTRACTION      Social History   Socioeconomic History   Marital status: Divorced    Spouse name: Not on file   Number of children: 2   Years of education: Not on file   Highest education level: Not on file  Occupational History   Occupation: Glass blower/designer  Tobacco Use   Smoking status: Every Day    Packs/day: 0.75    Years: 40.00    Pack years: 30.00    Types: E-cigarettes, Cigarettes   Smokeless tobacco: Never   Tobacco comments:    Pt given handout to quit smoking  Vaping Use   Vaping Use: Never used  Substance and Sexual Activity   Alcohol use: Yes    Alcohol/week: 90.0 standard drinks    Types: 90 Shots of liquor per week    Comment: 1/5 vodka every 3 days (750 ml)   Drug use: No   Sexual activity: Not on file  Other Topics Concern   Not on file  Social History Narrative   Not on file   Social Determinants of Health   Financial Resource Strain: Not on file  Food Insecurity: Not on file  Transportation Needs: Not on file  Physical Activity: Not on file  Stress: Not on file  Social Connections: Not on file    Family History  Problem Relation Age of Onset   Lung cancer Mother    Heart disease Father        has pacemaker   Breast cancer Sister    Colon cancer Neg Hx    Colon polyps  Neg Hx    Esophageal cancer Neg Hx    Kidney disease Neg Hx    Diabetes Neg Hx    Stomach cancer Neg Hx    Rectal cancer Neg Hx     Health Maintenance  Topic Date Due   Pneumococcal Vaccine 23-54 Years old (1 - PCV) Never done   Zoster Vaccines- Shingrix (1 of 2) Never done   COVID-19 Vaccine (4 - Booster for Pfizer series) 01/06/2021   COLONOSCOPY (Pts 45-50yr Insurance coverage will need to be confirmed)  09/24/2021   TETANUS/TDAP  10/23/2028   INFLUENZA VACCINE  Completed   Hepatitis C Screening  Completed   HPV VACCINES  Aged Out   HIV Screening  Discontinued     ----------------------------------------------------------------------------------------------------------------------------------------------------------------------------------------------------------------- Physical Exam BP 105/74 (BP Location: Left Arm, Patient Position: Sitting, Cuff Size: Normal)   Temp 97.8 F (36.6 C)   Ht '5\' 10"'$  (1.778 m)   Wt 161 lb 1.6 oz (73.1 kg)   SpO2 100%   BMI 23.12 kg/m   Physical Exam Constitutional:      Appearance: Normal appearance.  HENT:     Head: Normocephalic and atraumatic.  Eyes:     General: No scleral icterus. Cardiovascular:     Rate and Rhythm: Normal rate and regular rhythm.  Pulmonary:     Effort: Pulmonary effort is normal.     Breath sounds: Normal breath sounds.  Musculoskeletal:     Cervical back: Neck supple.  Skin:    General: Skin is warm and dry.  Neurological:     General: No focal deficit present.     Mental Status: He is alert.  Psychiatric:        Mood and Affect: Mood normal.        Behavior: Behavior normal.    ------------------------------------------------------------------------------------------------------------------------------------------------------------------------------------------------------------------- Assessment and Plan  Pure hypercholesterolemia He is tolerating atorvastatin well however I will have him reduce  his dose to 40 mg as his LDL is quite low.  Nicotine dependence, cigarettes, uncomplicated He continues to have difficulty with quitting smoking.  Counseled on quitting.  Excessive drinking alcohol Continues to consume alcohol in excess.  I counseled him on limiting alcohol intake and how this may worsen his depressive symptoms.  This may also contribute to his decreased energy levels as he does have some macrocytosis.  MDD (major depressive disorder), recurrent episode, moderate ([place] Continues to have depression and anxiety.  We will continue Effexor at current strength and will increase BuSpar to 15 mg twice per day as needed.   Meds ordered this encounter  Medications  busPIRone (BUSPAR) 15 MG tablet    Sig: Take 1 tablet (15 mg total) by mouth 2 (two) times daily.    Dispense:  60 tablet    Refill:  3   venlafaxine XR (EFFEXOR-XR) 150 MG 24 hr capsule    Sig: Take 1 capsule (150 mg total) by mouth daily with breakfast.    Dispense:  90 capsule    Refill:  1    Take along with '75mg'$  capsule   atorvastatin (LIPITOR) 40 MG tablet    Sig: Take 1 tablet (40 mg total) by mouth daily at 6 PM.    Dispense:  90 tablet    Refill:  1   venlafaxine XR (EFFEXOR XR) 75 MG 24 hr capsule    Sig: Take 1 capsule (75 mg total) by mouth daily with breakfast.    Dispense:  90 capsule    Refill:  1    Return in about 6 months (around 12/05/2021) for Depression/HLD.    This visit occurred during the SARS-CoV-2 public health emergency.  Safety protocols were in place, including screening questions prior to the visit, additional usage of staff PPE, and extensive cleaning of exam room while observing appropriate contact time as indicated for disinfecting solutions.

## 2021-06-07 NOTE — Patient Instructions (Addendum)
Increase buspar to '15mg'$  twice daily.  Continue effexor at '225mg'$  daily Reduce atorvastatin to '40mg'$  daily

## 2021-06-07 NOTE — Telephone Encounter (Signed)
Pt states pharmacy continues to offer refills of Fluoxetine. Pt does not take prozac.   LVM with pertinent information advising pharmacy to discontinue fluoxetine Rx.

## 2021-06-07 NOTE — Assessment & Plan Note (Signed)
He is tolerating atorvastatin well however I will have him reduce his dose to 40 mg as his LDL is quite low.

## 2021-06-07 NOTE — Assessment & Plan Note (Signed)
Continues to have depression and anxiety.  We will continue Effexor at current strength and will increase BuSpar to 15 mg twice per day as needed.

## 2021-06-07 NOTE — Assessment & Plan Note (Signed)
Continues to consume alcohol in excess.  I counseled him on limiting alcohol intake and how this may worsen his depressive symptoms.  This may also contribute to his decreased energy levels as he does have some macrocytosis.

## 2021-06-07 NOTE — Assessment & Plan Note (Signed)
He continues to have difficulty with quitting smoking.  Counseled on quitting.

## 2021-08-19 ENCOUNTER — Other Ambulatory Visit: Payer: Self-pay | Admitting: Cardiology

## 2021-08-19 ENCOUNTER — Other Ambulatory Visit: Payer: Self-pay | Admitting: Family Medicine

## 2021-08-19 DIAGNOSIS — I2510 Atherosclerotic heart disease of native coronary artery without angina pectoris: Secondary | ICD-10-CM

## 2021-08-19 DIAGNOSIS — R0789 Other chest pain: Secondary | ICD-10-CM

## 2021-09-01 ENCOUNTER — Other Ambulatory Visit: Payer: Self-pay

## 2021-09-01 ENCOUNTER — Ambulatory Visit (INDEPENDENT_AMBULATORY_CARE_PROVIDER_SITE_OTHER): Payer: BC Managed Care – PPO | Admitting: Cardiology

## 2021-09-01 ENCOUNTER — Encounter: Payer: Self-pay | Admitting: Cardiology

## 2021-09-01 DIAGNOSIS — E7800 Pure hypercholesterolemia, unspecified: Secondary | ICD-10-CM

## 2021-09-01 DIAGNOSIS — I700 Atherosclerosis of aorta: Secondary | ICD-10-CM

## 2021-09-01 DIAGNOSIS — K2270 Barrett's esophagus without dysplasia: Secondary | ICD-10-CM

## 2021-09-01 DIAGNOSIS — I25119 Atherosclerotic heart disease of native coronary artery with unspecified angina pectoris: Secondary | ICD-10-CM

## 2021-09-01 DIAGNOSIS — I5042 Chronic combined systolic (congestive) and diastolic (congestive) heart failure: Secondary | ICD-10-CM | POA: Diagnosis not present

## 2021-09-01 DIAGNOSIS — F17210 Nicotine dependence, cigarettes, uncomplicated: Secondary | ICD-10-CM

## 2021-09-01 MED ORDER — METOPROLOL SUCCINATE ER 25 MG PO TB24: 12.5000 mg | ORAL_TABLET | Freq: Every day | ORAL | 3 refills | Status: DC

## 2021-09-01 NOTE — Patient Instructions (Signed)
Medication Instructions:  Please discontinue your Carvedilol and Losartan.   Start Metoprolol Succinate 25 mg - 1/2 tablet daily. Continue all other medications as listed.  *If you need a refill on your cardiac medications before your next appointment, please call your pharmacy*  Follow-Up: At [facility], you and your health needs are our priority.  As part of our continuing mission to provide you with exceptional heart care, we have created designated Provider Care Teams.  These Care Teams include your primary Cardiologist (physician) and Advanced Practice Providers (APPs -  Physician Assistants and Nurse Practitioners) who all work together to provide you with the care you need, when you need it.  We recommend signing up for the patient portal called "MyChart".  Sign up information is provided on this After Visit Summary.  MyChart is used to connect with patients for Virtual Visits (Telemedicine).  Patients are able to view lab/test results, encounter notes, upcoming appointments, etc.  Non-urgent messages can be sent to your provider as well.   To learn more about what you can do with MyChart, go to [email or web address].    Your next appointment:   6 month(s)  The format for your next appointment:   In Person  Provider:   Robbie Lis, PA-C, Melina Copa, PA-C, Cecilie Kicks, NP, Ermalinda Barrios, PA-C, Christen Bame, NP, or Richardson Dopp, PA-C         Thank you for choosing [facility]!!

## 2021-09-01 NOTE — Assessment & Plan Note (Addendum)
EF 45% reduced.  Tried to continue carvedilol and ARB but his blood pressures were too low when he felt dizzy.  Blood pressure today is 100/70 off of both of those medications. dizziness at times therefore cannot titrate dose or tolerate Entresto.  NYHA class I.  Doing well. We will however try low-dose Toprol-XL 12.5 mg once a day.

## 2021-09-01 NOTE — Assessment & Plan Note (Signed)
Has GERD esophageal stricture or Schatzki's ring.  This is improved after taking Prevacid.

## 2021-09-01 NOTE — Assessment & Plan Note (Signed)
Mild wheeze heard on exam.  Continue to encourage tobacco cessation.

## 2021-09-01 NOTE — Assessment & Plan Note (Signed)
LDL previously 33.  High intensity statin.  80 mg of atorvastatin.  Not having any myalgias.  ALT 19.

## 2021-09-01 NOTE — Assessment & Plan Note (Signed)
Continue with statin therapy.  Aspirin.

## 2021-09-01 NOTE — Progress Notes (Signed)
Cardiology Office Note:    Date:  09/01/2021   ID:  Jeremiah Short, DOB 1961/05/19, MRN [id number]  PCP:  Luetta Nutting, DO  Cardiologist:  Candee Furbish, MD  Electrophysiologist:  None   Referring MD: Luetta Nutting, DO     History of Present Illness:    Jeremiah Short is a 60 y.o. male here for the follow up of coronary artery disease, hypertension , and systolic heart failure with ejection fraction of 45 to 50%.  Has a history of coronary artery disease status post PCI to the LAD in the setting of ST elevation myocardial infarction in 2009 and again in December 2017 with diagonal 2 jailed treated with angioplasty here for follow-up.  Plan was to continue with indefinite DAPT at the time.  Plavix and aspirin.  Occasionally may have some periodic chest discomfort.  Possibly GI.  Isosorbide was increased to 60 at last visit with Truitt Merle, NP 05/13/2018.  07/24/2019-was here for the follow-up of CAD.  Prior PCI to LAD in the setting of STEMI in 2009 and then again in December 27 with a second diagonal that was jailed treated with angioplasty.  Plan was to continue with dual antiplatelet therapy lifelong.  He is on both Plavix and aspirin.  EF was in the 45% range.  LDL 33.  Excellent.  Eating trail mix, nuts yesterday, took NTG. May need esophageal dilated he thinks. Chest pain mimics.  Overall no fevers chills nausea vomiting syncope bleeding.  Still smoking.   Today,  His BP has been low and has also experienced vertigo. He says that he stopped the carvetolol (took twice a day) because he thought it attributed to his dizziness. He ran out of the lasartin, but could not get a refill until making an appointment with cardiologist. When he went back to work after taking one of his medicines, he experienced vertigo every time he rose from his chair. He is still compliant with IMDUR.  He is working to stop smoke completely.  He denies any chest pain.  He denies any palpitations, chest  pain or shortness of breath. No headaches, syncope, orthopnea, PND, lower extremity edema or exertional symptoms.    Past Medical History:  Diagnosis Date   Anxiety    Aortic atherosclerosis ([place]) 02/24/2021   Barrett's esophagus    Cardiomyopathy in other diseases classified elsewhere    Closed compression fracture of body of L1 vertebra ([place]) 02/24/2021   Coronary artery disease    Depression    Diverticulosis    Emphysema lung ([place]) 02/24/2021   Emphysema, unspecified ([place])    Esophageal stricture    GERD (gastroesophageal reflux disease)    History of ETOH abuse    Hypertension    Myocardial infarction [facility]) 2009   Pure hypercholesterolemia    Smoker    Stented coronary artery    Tubular adenoma of colon 2012   Wears glasses     Past Surgical History:  Procedure Laterality Date   APPENDECTOMY  2011   during colectomy   CARDIAC CATHETERIZATION  2009   placed 2 stents   CARDIAC CATHETERIZATION N/A 09/27/2016   Procedure: Left Heart Cath and Coronary Angiography;  Surgeon: [name], MD;  Location: [facility];  Service: Cardiovascular;  Laterality: N/A;   CARDIAC CATHETERIZATION N/A 09/27/2016   Procedure: Coronary Stent Intervention;  Surgeon: [name], MD;  Location: [facility];  Service: Cardiovascular;  Laterality: N/A;   CHOLECYSTECTOMY N/A 02/27/2021   Procedure: LAPAROSCOPIC CHOLECYSTECTOMY;  Surgeon: Erroll Luna, MD;  Location: [facility];  Service: General;  Laterality: N/A;   CORONARY STENT PLACEMENT     HERNIA REPAIR     INGUINAL HERNIA REPAIR Left 09/16/2013   Procedure: HERNIA REPAIR INGUINAL ADULT;  Surgeon: [name], MD;  Location: [facility];  Service: General;  Laterality: Left;   MOUTH SURGERY     PARTIAL COLECTOMY  2011   sigmoid   TONSILLECTOMY     WISDOM TOOTH EXTRACTION      Current Medications: Current Meds  Medication Sig   aspirin EC 81 MG EC tablet Take 1 tablet (81 mg total) by mouth daily.    atorvastatin (LIPITOR) 40 MG tablet Take 1 tablet (40 mg total) by mouth daily at 6 PM.   busPIRone (BUSPAR) 15 MG tablet Take 1 tablet (15 mg total) by mouth 2 (two) times daily.   clopidogrel (PLAVIX) 75 MG tablet Take 1 tablet (75 mg total) by mouth daily. Please make overdue appt with Dr. [name] before anymore refills. Thank you 3rd and Final Attempt   folic acid (FOLVITE) 1 MG tablet Take 1 tablet (1 mg total) by mouth daily.   isosorbide mononitrate (IMDUR) 60 MG 24 hr tablet Take 1 tablet (60 mg total) by mouth daily. Please make overdue appt with Dr. [name] before anymore refills. Thank you 3rd and Final Attempt   lansoprazole (PREVACID) 30 MG capsule Take 1 capsule (30 mg total) by mouth daily before breakfast.   metoprolol succinate (TOPROL-XL) 25 MG 24 hr tablet Take 0.5 tablets (12.5 mg total) by mouth daily.   Multiple Vitamin (MULTIVITAMIN) capsule Take 1 capsule by mouth daily.   nitroGLYCERIN (NITROSTAT) 0.4 MG SL tablet PLACE 1 TABLET UNDER THE TONGUE EVERY 5 MINUTES FOR 3 DOSES AS NEEDED FOR CHEST PAIN   thiamine 100 MG tablet Take 1 tablet (100 mg total) by mouth daily.   venlafaxine XR (EFFEXOR-XR) 150 MG 24 hr capsule Take 1 capsule (150 mg total) by mouth daily with breakfast.   venlafaxine XR (EFFEXOR-XR) 75 MG 24 hr capsule TAKE 3 CAPSULES (225 MG TOTAL) BY MOUTH DAILY WITH BREAKFAST.   [DISCONTINUED] carvedilol (COREG) 3.125 MG tablet Take 1 tablet (3.125 mg total) by mouth 2 (two) times daily with a meal.   [DISCONTINUED] losartan (COZAAR) 50 MG tablet Take 1 tablet (50 mg total) by mouth daily. Please make overdue appt with Dr. [name] before anymore refills. Thank you 3rd and Final Attempt     Allergies:   Morphine and related   Social History   Socioeconomic History   Marital status: Divorced    Spouse name: Not on file   Number of children: 2   Years of education: Not on file   Highest education level: Not on file  Occupational History   Occupation: Environmental education officer  Tobacco Use   Smoking status: Every Day    Packs/day: 0.75    Years: 40.00    Pack years: 30.00    Types: E-cigarettes, Cigarettes   Smokeless tobacco: Never   Tobacco comments:    Pt given handout to quit smoking  Vaping Use   Vaping Use: Never used  Substance and Sexual Activity   Alcohol use: Yes    Alcohol/week: 90.0 standard drinks    Types: 90 Shots of liquor per week    Comment: 1/5 vodka every 3 days (750 ml)   Drug use: No   Sexual activity: Not on file  Other Topics Concern   Not  on file  Social History Narrative   Not on file   Social Determinants of Health   Financial Resource Strain: Not on file  Food Insecurity: Not on file  Transportation Needs: Not on file  Physical Activity: Not on file  Stress: Not on file  Social Connections: Not on file     Family History: The patient's family history includes Breast cancer in his sister; Heart disease in his father; Lung cancer in his mother. There is no history of Colon cancer, Colon polyps, Esophageal cancer, Kidney disease, Diabetes, Stomach cancer, or Rectal cancer.  ROS:   Please see the history of present illness.    (+) Vertigo  All other systems reviewed and are negative.  EKGs/Labs/Other Studies Reviewed:    The following studies were reviewed today: Labs prior office note, EKG  ECHO 01/11/17: - Left ventricle: The cavity size was normal. Wall thickness was   increased in a pattern of mild LVH. Systolic function was mildly   reduced. The estimated ejection fraction was in the range of 45%   to 50%. No mural thrombus with Definity contrast. Distal   anterior, anteroseptal, apical and inferoapical akinesis   suggestive of infarct. Doppler parameters are consistent with   abnormal left ventricular relaxation (grade 1 diastolic   dysfunction). The E/e&' ratio is <8, suggesting normal LV filling   pressure. - Mitral valve: Mildly thickened leaflets . There was trivial   regurgitation. - Left  atrium: The atrium was normal in size. - Systemic veins: The IVC measures >2.1 cm, but collapses more than   50%, suggesting an elevated RA pressure of 8 mmHg.   Impressions:   - Compared to a prior study in 10/2016, the LVEF has improved to   45-50%. There are persistent distal anterior, anteroseptal,   apical and inferoapical wall motion abnormalities consistent with   LAD territory infarct/scar.  EKG:  07/24/2019: sinus rhythm 50 old inferior infarct Q waves in two 3/2, subtle ST elevation in V2, V3 less than 1 mm 05/13/2018-sinus rhythm poor R wave progression, old anterior infarct pattern.  Personally reviewed  Recent Labs: 02/24/2021: Magnesium 1.8 02/26/2021: Hemoglobin 12.5; Platelets 130 03/01/2021: ALT 15; BUN 12; Creatinine, Ser 0.71; Potassium 3.7; Sodium 138  Recent Lipid Panel    Component Value Date/Time   CHOL 124 08/19/2020 0000   CHOL 111 07/17/2017 1449   TRIG 67 08/19/2020 0000   HDL 88 (A) 08/19/2020 0000   HDL 63 07/17/2017 1449   CHOLHDL 1.7 12/31/2019 0857   VLDL 12.4 06/24/2019 0844   LDLCALC 22 08/19/2020 0000   LDLCALC 41 12/31/2019 0857   LDLDIRECT 15.0 05/06/2018 0914    Physical Exam:    VS:  BP 100/70 (BP Location: Left Arm, Patient Position: Sitting, Cuff Size: Normal)   Pulse (!) 102   Ht 5\' 10"  (1.778 m)   Wt 161 lb (73 kg)   SpO2 98%   BMI 23.10 kg/m     Wt Readings from Last 3 Encounters:  09/01/21 161 lb (73 kg)  06/07/21 161 lb 1.6 oz (73.1 kg)  02/23/21 150 lb (68 kg)     GEN: Well nourished, well developed, in no acute distress  HEENT: normal  Neck: no JVD, carotid bruits, or masses Cardiac: RRR; no murmurs, rubs, or gallops,no edema  Respiratory:  clear to auscultation bilaterally, normal work of breathing, minimal wheeze GI: soft, nontender, nondistended, + BS MS: no deformity or atrophy  Skin: warm and dry, no rash Neuro:  Alert  and Oriented x 3, Strength and sensation are intact Psych: euthymic mood, full  affect   ASSESSMENT:    1. Coronary artery disease involving native coronary artery of native heart with angina pectoris ([place])   2. Chronic combined systolic (congestive) and diastolic (congestive) heart failure (HCC)   3. Pure hypercholesterolemia   4. Aortic atherosclerosis ([place])   5. Barrett's esophagus without dysplasia   6. Nicotine dependence, cigarettes, uncomplicated     PLAN:    Coronary artery disease involving native coronary artery of native heart with angina pectoris ([place]) Prior myocardial infarction anterior in 2009 as well as 2017.  Plan is to continue with dual antiplatelet therapy long-term.  Watch for any signs of bleeding.  Prior hemoglobin 13.1.  Continue with aggressive risk factor modification.  On aspirin 81 mg, could not tolerate carvedilol 3.125 mg twice a day atorvastatin 40 mg a day Plavix 75 mg and could not tolerate losartan 50 mg a day, continuing isosorbide 60 mg a day as an antianginal.  We will start him on Toprol-XL 12.5 mg once a day.  Very low-dose.  He has not been able to tolerate other medications.  We will see him back in 6 months with an APP.  Another alternative may be to use Jardiance in isolation.  This may not affect blood pressure.  Chronic combined systolic (congestive) and diastolic (congestive) heart failure (HCC) EF 45% reduced.  Tried to continue carvedilol and ARB but his blood pressures were too low when he felt dizzy.  Blood pressure today is 100/70 off of both of those medications. dizziness at times therefore cannot titrate dose or tolerate Entresto.  NYHA class I.  Doing well. We will however try low-dose Toprol-XL 12.5 mg once a day.  Pure hypercholesterolemia LDL previously 33.  High intensity statin.  80 mg of atorvastatin.  Not having any myalgias.  ALT 19.  Aortic atherosclerosis ([place]) Continue with statin therapy.  Aspirin.  Barrett's esophagus without dysplasia Has GERD esophageal stricture or Schatzki's ring.  This is  improved after taking Prevacid.  Emphysema lung ([place]) Mild wheeze heard on exam.  Continue to encourage tobacco cessation.  Nicotine dependence, cigarettes, uncomplicated Continue to encourage tobacco cessation.   In order of problems listed above:   F/U 6 Months with an APP   Medication Adjustments/Labs and Tests Ordered: Current medicines are reviewed at length with the patient today.  Concerns regarding medicines are outlined above.  No orders of the defined types were placed in this encounter.  Meds ordered this encounter  Medications   metoprolol succinate (TOPROL-XL) 25 MG 24 hr tablet    Sig: Take 0.5 tablets (12.5 mg total) by mouth daily.    Dispense:  45 tablet    Refill:  3     Patient Instructions  Medication Instructions:  Please discontinue your Carvedilol and Losartan.   Start Metoprolol Succinate 25 mg - 1/2 tablet daily. Continue all other medications as listed.  *If you need a refill on your cardiac medications before your next appointment, please call your pharmacy*  Follow-Up: At [facility], you and your health needs are our priority.  As part of our continuing mission to provide you with exceptional heart care, we have created designated Provider Care Teams.  These Care Teams include your primary Cardiologist (physician) and Advanced Practice Providers (APPs -  Physician Assistants and Nurse Practitioners) who all work together to provide you with the care you need, when you need it.  We recommend signing up  for the patient portal called "MyChart".  Sign up information is provided on this After Visit Summary.  MyChart is used to connect with patients for Virtual Visits (Telemedicine).  Patients are able to view lab/test results, encounter notes, upcoming appointments, etc.  Non-urgent messages can be sent to your provider as well.   To learn more about what you can do with MyChart, go to [email or web address].    Your next appointment:   6  month(s)  The format for your next appointment:   In Person  Provider:   Robbie Lis, PA-C, Melina Copa, PA-C, Cecilie Kicks, NP, Ermalinda Barrios, PA-C, Christen Bame, NP, or Richardson Dopp, PA-C         Thank you for choosing [facility]!!      Rondell Reams as a scribe for Candee Furbish, MD.,have documented all relevant documentation on the behalf of Candee Furbish, MD,as directed by  Candee Furbish, MD while in the presence of Candee Furbish, MD.   I, Candee Furbish, MD, have reviewed all documentation for this visit. The documentation on 09/01/21 for the exam, diagnosis, procedures, and orders are all accurate and complete.   Signed, Candee Furbish, MD  09/01/2021 12:32 PM    [facility]

## 2021-09-01 NOTE — Assessment & Plan Note (Addendum)
Prior myocardial infarction anterior in 2009 as well as 2017.  Plan is to continue with dual antiplatelet therapy long-term.  Watch for any signs of bleeding.  Prior hemoglobin 13.1.  Continue with aggressive risk factor modification.  On aspirin 81 mg, could not tolerate carvedilol 3.125 mg twice a day atorvastatin 40 mg a day Plavix 75 mg and could not tolerate losartan 50 mg a day, continuing isosorbide 60 mg a day as an antianginal.  We will start him on Toprol-XL 12.5 mg once a day.  Very low-dose.  He has not been able to tolerate other medications.  We will see him back in 6 months with an APP.  Another alternative may be to use Jardiance in isolation.  This may not affect blood pressure.

## 2021-09-01 NOTE — Assessment & Plan Note (Signed)
Continue to encourage tobacco cessation. 

## 2021-09-12 ENCOUNTER — Encounter: Payer: Self-pay | Admitting: Gastroenterology

## 2021-09-15 DIAGNOSIS — M25562 Pain in left knee: Secondary | ICD-10-CM | POA: Diagnosis not present

## 2021-09-29 ENCOUNTER — Other Ambulatory Visit: Payer: Self-pay | Admitting: Family Medicine

## 2021-09-29 ENCOUNTER — Other Ambulatory Visit: Payer: Self-pay | Admitting: Cardiology

## 2021-09-29 DIAGNOSIS — R0789 Other chest pain: Secondary | ICD-10-CM

## 2021-09-29 DIAGNOSIS — I2510 Atherosclerotic heart disease of native coronary artery without angina pectoris: Secondary | ICD-10-CM

## 2021-10-18 ENCOUNTER — Encounter: Payer: Self-pay | Admitting: Gastroenterology

## 2021-11-25 ENCOUNTER — Emergency Department (HOSPITAL_BASED_OUTPATIENT_CLINIC_OR_DEPARTMENT_OTHER): Payer: BC Managed Care – PPO

## 2021-11-25 ENCOUNTER — Emergency Department (HOSPITAL_BASED_OUTPATIENT_CLINIC_OR_DEPARTMENT_OTHER)
Admission: EM | Admit: 2021-11-25 | Discharge: 2021-11-25 | Discharge status: Against Medical Advice | Payer: BC Managed Care – PPO | Attending: Emergency Medicine | Admitting: Emergency Medicine

## 2021-11-25 ENCOUNTER — Other Ambulatory Visit: Payer: Self-pay

## 2021-11-25 ENCOUNTER — Encounter (HOSPITAL_BASED_OUTPATIENT_CLINIC_OR_DEPARTMENT_OTHER): Payer: Self-pay

## 2021-11-25 DIAGNOSIS — Z5321 Procedure and treatment not carried out due to patient leaving prior to being seen by health care provider: Secondary | ICD-10-CM | POA: Insufficient documentation

## 2021-11-25 DIAGNOSIS — R079 Chest pain, unspecified: Secondary | ICD-10-CM | POA: Diagnosis not present

## 2021-11-25 LAB — CBC
HCT: 41.1 % (ref 39.0–52.0)
Hemoglobin: 14 g/dL (ref 13.0–17.0)
MCH: 34.6 pg — ABNORMAL HIGH (ref 26.0–34.0)
MCHC: 34.1 g/dL (ref 30.0–36.0)
MCV: 101.5 fL — ABNORMAL HIGH (ref 80.0–100.0)
Platelets: 142 10*3/uL — ABNORMAL LOW (ref 150–400)
RBC: 4.05 MIL/uL — ABNORMAL LOW (ref 4.22–5.81)
RDW: 13.4 % (ref 11.5–15.5)
WBC: 5.7 10*3/uL (ref 4.0–10.5)
nRBC: 0 % (ref 0.0–0.2)

## 2021-11-25 LAB — BASIC METABOLIC PANEL
Anion gap: 8 (ref 5–15)
BUN: 17 mg/dL (ref 8–23)
CO2: 28 mmol/L (ref 22–32)
Calcium: 9 mg/dL (ref 8.9–10.3)
Chloride: 99 mmol/L (ref 98–111)
Creatinine, Ser: 0.94 mg/dL (ref 0.61–1.24)
GFR, Estimated: >60 mL/min (ref >60)
Glucose, Bld: 101 mg/dL — ABNORMAL HIGH (ref 70–99)
Potassium: 3.8 mmol/L (ref 3.5–5.1)
Sodium: 135 mmol/L (ref 135–145)

## 2021-11-25 LAB — TROPONIN I (HIGH SENSITIVITY): Troponin I (High Sensitivity): 6 ng/L (ref <18)

## 2021-11-25 NOTE — ED Triage Notes (Signed)
Pt c/o CP x 1 hour-took NTG x 3-pain was 8/10 before NTG after 2/10-NAD-steady gait

## 2021-11-25 NOTE — ED Notes (Signed)
Pt pleasantly refused 2nd troponin/blood draw-states "I feel fine-I'm just gonna leave"-NAD-steady gait

## 2021-11-28 ENCOUNTER — Telehealth: Payer: Self-pay | Admitting: General Practice

## 2021-11-28 NOTE — Telephone Encounter (Signed)
Transition Care Management Unsuccessful Follow-up Telephone Call  Date of discharge and from where:  11/25/21 from [facility]  Attempts:  1st Attempt  Reason for unsuccessful TCM follow-up call:  Left voice message Left before seen. Patient has an appt with PCP on 12/06/21.

## 2021-11-30 NOTE — Telephone Encounter (Signed)
Transition Care Management Unsuccessful Follow-up Telephone Call ? ?Date of discharge and from where:  11/25/21 from [facility] ? ?Attempts:  2nd Attempt ? ?Reason for unsuccessful TCM follow-up call:  Left voice message ? ?  ?

## 2021-12-02 NOTE — Telephone Encounter (Signed)
Transition Care Management Unsuccessful Follow-up Telephone Call ? ?Date of discharge and from where:  11/25/21 from [facility] ? ?Attempts:  3rd Attempt ? ?Reason for unsuccessful TCM follow-up call:  Unable to reach patient ? ?  ?

## 2021-12-05 ENCOUNTER — Ambulatory Visit: Payer: BC Managed Care – PPO | Admitting: Family Medicine

## 2021-12-06 ENCOUNTER — Ambulatory Visit: Payer: BC Managed Care – PPO | Admitting: Family Medicine

## 2021-12-26 ENCOUNTER — Other Ambulatory Visit: Payer: Self-pay | Admitting: Family Medicine

## 2021-12-26 NOTE — Telephone Encounter (Signed)
LVM for patient to call back to get this appointment with Dr Zigmund Daniel scheduled. ?

## 2021-12-26 NOTE — Telephone Encounter (Signed)
Please contact pt to schedule appt. Return in about 6 months (around 12/05/2021) for Depression/HLD. ? ?Thanks ?

## 2022-01-03 ENCOUNTER — Telehealth: Payer: Self-pay | Admitting: Cardiology

## 2022-01-03 NOTE — Telephone Encounter (Signed)
Pt calling requesting Dr. [name] refill his medication atorvastatin. Would Dr. [name] like to refill this medication, he did not prescribed? Please address ?

## 2022-01-03 NOTE — Telephone Encounter (Signed)
Left message for pt to call back or send a message through [place].   Dr Luetta Nutting has been prescribing Atorvastatin for this pt.  Unsure if pt is taking the 40 mg tablets or 80 mg tablets as both are on his medication list.  We have no recent lab results for him either.  Requested pt either contact Dr Zigmund Daniel for refills or notify our office of current dosage and schedule for lipid/ALT to be drawn.   ?

## 2022-01-03 NOTE — Telephone Encounter (Signed)
?*  STAT* If patient is at the pharmacy, call can be transferred to refill team. ? ? ?1. Which medications need to be refilled? (please list name of each medication and dose if known) new prescription for Atorvastatin ? ?2. Which pharmacy/location (including street and city if local pharmacy) is medication to be sent to?  [facility] [place], [place] ? ?3. Do they need a 30 day or 90 day supply?  ?90 days and refills ?

## 2022-01-10 ENCOUNTER — Telehealth: Payer: Self-pay | Admitting: Family Medicine

## 2022-01-10 MED ORDER — ATORVASTATIN CALCIUM 40 MG PO TABS: 40.0000 mg | ORAL_TABLET | Freq: Every day | ORAL | 0 refills | Status: DC

## 2022-01-10 NOTE — Addendum Note (Signed)
Addended by: Peggye Ley on: 01/10/2022 05:02 PM ? ? Modules accepted: Orders ? ?

## 2022-01-10 NOTE — Telephone Encounter (Signed)
Patient called to get a refill his atorvastatin. Patient scheduled for an appt on 5/11. Can a refill be sent until then. Please advise. ?

## 2022-01-19 NOTE — Telephone Encounter (Signed)
Pt has not called back to review as requested.  Atorvastatin 40 mg has been filled by Dr Luetta Nutting on 01/10/22.  Will close this encounter. ?

## 2022-01-30 ENCOUNTER — Other Ambulatory Visit: Payer: Self-pay | Admitting: Family Medicine

## 2022-01-30 ENCOUNTER — Other Ambulatory Visit: Payer: Self-pay | Admitting: Cardiology

## 2022-02-09 ENCOUNTER — Ambulatory Visit (INDEPENDENT_AMBULATORY_CARE_PROVIDER_SITE_OTHER): Payer: BC Managed Care – PPO | Admitting: Family Medicine

## 2022-02-09 ENCOUNTER — Encounter: Payer: Self-pay | Admitting: Family Medicine

## 2022-02-09 VITALS — BP 108/77 | HR 83 | Ht 70.0 in | Wt 154.0 lb

## 2022-02-09 DIAGNOSIS — I700 Atherosclerosis of aorta: Secondary | ICD-10-CM | POA: Diagnosis not present

## 2022-02-09 DIAGNOSIS — J439 Emphysema, unspecified: Secondary | ICD-10-CM

## 2022-02-09 DIAGNOSIS — F3341 Major depressive disorder, recurrent, in partial remission: Secondary | ICD-10-CM

## 2022-02-09 DIAGNOSIS — Z0000 Encounter for general adult medical examination without abnormal findings: Secondary | ICD-10-CM

## 2022-02-09 DIAGNOSIS — E7800 Pure hypercholesterolemia, unspecified: Secondary | ICD-10-CM | POA: Diagnosis not present

## 2022-02-09 DIAGNOSIS — Z125 Encounter for screening for malignant neoplasm of prostate: Secondary | ICD-10-CM

## 2022-02-09 DIAGNOSIS — Z122 Encounter for screening for malignant neoplasm of respiratory organs: Secondary | ICD-10-CM

## 2022-02-09 DIAGNOSIS — Z23 Encounter for immunization: Secondary | ICD-10-CM

## 2022-02-09 DIAGNOSIS — F17210 Nicotine dependence, cigarettes, uncomplicated: Secondary | ICD-10-CM

## 2022-02-09 DIAGNOSIS — I25119 Atherosclerotic heart disease of native coronary artery with unspecified angina pectoris: Secondary | ICD-10-CM

## 2022-02-09 MED ORDER — ALBUTEROL SULFATE HFA 108 (90 BASE) MCG/ACT IN AERS: 2.0000 | INHALATION_SPRAY | Freq: Four times a day (QID) | RESPIRATORY_TRACT | 0 refills | Status: DC | PRN

## 2022-02-09 MED ORDER — ATOMOXETINE HCL 40 MG PO CAPS: ORAL_CAPSULE | ORAL | 2 refills | Status: DC

## 2022-02-09 MED ORDER — VENLAFAXINE HCL ER 150 MG PO CP24: 150.0000 mg | ORAL_CAPSULE | Freq: Every day | ORAL | 0 refills | Status: DC

## 2022-02-09 NOTE — Assessment & Plan Note (Signed)
History of 40+ pack years of smoking with continued smoking.  Discussed repeating low-dose CT for lung cancer screening.  He would like to proceed with this.  Last scan in 2021. ?

## 2022-02-09 NOTE — Assessment & Plan Note (Signed)
Continues to have anhedonia and fatigue.  Has tried several medications previously.  He does get frustrated by his difficulty keeping things organized.  Prior history of ADD.  He did discuss trial of Strattera.  Would not recommend stimulant medications at this time due to his cardiac history and continued smoking. ?

## 2022-02-09 NOTE — Assessment & Plan Note (Signed)
Follow cardiology.  Stable at this time with current medications.  Denies anginal symptoms.  We did discuss need to quit smoking. ?

## 2022-02-09 NOTE — Progress Notes (Signed)
Pt will return with [employer] form for completion by Dr. Zigmund Daniel. Dr. Zigmund Daniel is aware. ?

## 2022-02-09 NOTE — Assessment & Plan Note (Signed)
He is having some intermittent wheezing.  Discussed importance of quitting smoking.  Albuterol inhaler ordered. ?

## 2022-02-09 NOTE — Patient Instructions (Signed)
Reduce effexor to '150mg'$  daily  ?Start strattera '40mg'$  once per day for 5 days then increase to twice per day.  ?See me again in 2 months.  ?

## 2022-02-09 NOTE — Progress Notes (Signed)
?Ryo Klang - 61 y.o. male MRN [id number]  Date of birth: 27-Dec-1960 ? ?Subjective ?No chief complaint on file. ? ? ?HPI ?Collen is a 61 year old male here today for follow-up visit. ? ?Continues to have difficulty with motivation and energy levels.  Feels like he is disorganized throughout the day which causes him to become frustrated.  He was previously diagnosed with ADD and treated with stimulant medication several years ago.  He is currently on Effexor at 225 mg daily.  He does feel like this is helpful but does not quite feel where he would like to be.   ? ?Blood pressure has remained well controlled with metoprolol and Imdur.  He continues to see cardiology regularly.  He denies chest pain, shortness of breath, palpitations, headaches or vision changes. ? ?He does continue to smoke 1 pack/day.  He has been smoking this amount for approximately 40 years.  He had lung cancer screening with low-dose CT in 2021 with recommendations to repeat annually.  He did not have scan last year.  He would like to go ahead and have this done this year. ? ?ROS:  A comprehensive ROS was completed and negative except as noted per HPI ? ?Allergies  ?Allergen Reactions  ? Morphine And Related Other (See Comments)  ?  dont remember the symptoms. Has tolerated dilaudid and oxycodone  ? ? ?Past Medical History:  ?Diagnosis Date  ? Anxiety   ? Aortic atherosclerosis ([place]) 02/24/2021  ? Barrett's esophagus   ? Cardiomyopathy in other diseases classified elsewhere   ? Closed compression fracture of body of L1 vertebra ([place]) 02/24/2021  ? Coronary artery disease   ? Depression   ? Diverticulosis   ? Emphysema lung ([place]) 02/24/2021  ? Emphysema, unspecified ([place])   ? Esophageal stricture   ? GERD (gastroesophageal reflux disease)   ? History of ETOH abuse   ? Hypertension   ? Myocardial infarction [facility]) 2009  ? Pure hypercholesterolemia   ? Smoker   ? Stented coronary artery   ? Tubular adenoma of colon 2012  ? Wears glasses   ? ? ?Past  Surgical History:  ?Procedure Laterality Date  ? APPENDECTOMY  2011  ? during colectomy  ? CARDIAC CATHETERIZATION  2009  ? placed 2 stents  ? CARDIAC CATHETERIZATION N/A 09/27/2016  ? Procedure: Left Heart Cath and Coronary Angiography;  Surgeon: [name], MD;  Location: [facility];  Service: Cardiovascular;  Laterality: N/A;  ? CARDIAC CATHETERIZATION N/A 09/27/2016  ? Procedure: Coronary Stent Intervention;  Surgeon: [name], MD;  Location: [facility];  Service: Cardiovascular;  Laterality: N/A;  ? CHOLECYSTECTOMY N/A 02/27/2021  ? Procedure: LAPAROSCOPIC CHOLECYSTECTOMY;  Surgeon: Erroll Luna, MD;  Location: [facility];  Service: General;  Laterality: N/A;  ? CORONARY STENT PLACEMENT    ? HERNIA REPAIR    ? INGUINAL HERNIA REPAIR Left 09/16/2013  ? Procedure: HERNIA REPAIR INGUINAL ADULT;  Surgeon: [name], MD;  Location: [facility];  Service: General;  Laterality: Left;  ? MOUTH SURGERY    ? PARTIAL COLECTOMY  2011  ? sigmoid  ? TONSILLECTOMY    ? WISDOM TOOTH EXTRACTION    ? ? ?Social History  ? ?Socioeconomic History  ? Marital status: Divorced  ?  Spouse name: Not on file  ? Number of children: 2  ? Years of education: Not on file  ? Highest education level: Not on file  ?Occupational History  ? Occupation: Glass blower/designer  ?  Tobacco Use  ? Smoking status: Every Day  ?  Packs/day: 0.75  ?  Years: 40.00  ?  Pack years: 30.00  ?  Types: Cigarettes  ? Smokeless tobacco: Never  ? Tobacco comments:  ?  Pt given handout to quit smoking  ?Vaping Use  ? Vaping Use: Never used  ?Substance and Sexual Activity  ? Alcohol use: Not Currently  ?  Comment: daily  ? Drug use: No  ? Sexual activity: Not on file  ?Other Topics Concern  ? Not on file  ?Social History Narrative  ? Not on file  ? ?Social Determinants of Health  ? ?Financial Resource Strain: Not on file  ?Food Insecurity: Not on file  ?Transportation Needs: Not on file  ?Physical Activity: Not on file  ?Stress: Not on  file  ?Social Connections: Not on file  ? ? ?Family History  ?Problem Relation Age of Onset  ? Lung cancer Mother   ? Heart disease Father   ?     has pacemaker  ? Breast cancer Sister   ? Colon cancer Neg Hx   ? Colon polyps Neg Hx   ? Esophageal cancer Neg Hx   ? Kidney disease Neg Hx   ? Diabetes Neg Hx   ? Stomach cancer Neg Hx   ? Rectal cancer Neg Hx   ? ? ?Health Maintenance  ?Topic Date Due  ? Zoster Vaccines- Shingrix (1 of 2) Never done  ? COVID-19 Vaccine (4 - Booster for Pfizer series) 12/03/2020  ? COLONOSCOPY (Pts 45-28yr Insurance coverage will need to be confirmed)  09/24/2021  ? INFLUENZA VACCINE  05/02/2022  ? TETANUS/TDAP  10/23/2028  ? Hepatitis C Screening  Completed  ? HPV VACCINES  Aged Out  ? HIV Screening  Discontinued  ? ? ? ?----------------------------------------------------------------------------------------------------------------------------------------------------------------------------------------------------------------- ?Physical Exam ?BP 108/77 (BP Location: Left Arm, Patient Position: Sitting, Cuff Size: Normal)   Pulse 83   Ht '5\' 10"'$  (1.778 m)   Wt 154 lb (69.9 kg)   SpO2 97%   BMI 22.10 kg/m?  ? ?Physical Exam ?Constitutional:   ?   Appearance: Normal appearance.  ?HENT:  ?   Head: Normocephalic and atraumatic.  ?Eyes:  ?   General: No scleral icterus. ?Cardiovascular:  ?   Rate and Rhythm: Normal rate and regular rhythm.  ?Pulmonary:  ?   Effort: Pulmonary effort is normal.  ?   Breath sounds: Normal breath sounds.  ?Musculoskeletal:  ?   Cervical back: Neck supple.  ?Neurological:  ?   General: No focal deficit present.  ?   Mental Status: He is alert.  ?Psychiatric:     ?   Mood and Affect: Mood normal.     ?   Behavior: Behavior normal.  ? ? ?------------------------------------------------------------------------------------------------------------------------------------------------------------------------------------------------------------------- ?Assessment  and Plan ? ?Coronary artery disease involving native coronary artery of native heart with angina pectoris ([place] ?Follow cardiology.  Stable at this time with current medications.  Denies anginal symptoms.  We did discuss need to quit smoking. ? ?[place]se[place] lung ([place] ?He is having some intermittent wheezing.  Discussed importance of quitting smoking.  Albuterol inhaler ordered. ? ?MDD (major depressive disorder) ?Continues to have anhedonia and fatigue.  Has tried several medications previously.  He does get frustrated by his difficulty keeping things organized.  Prior history of ADD.  He did discuss trial of Strattera.  Would not recommend stimulant medications at this time due to his cardiac history and continued smoking. ? ?Smoking greater than 20 pack  years ?History of 40+ pack years of smoking with continued smoking.  Discussed repeating low-dose CT for lung cancer screening.  He would like to proceed with this.  Last scan in 2021. ? ? ?No orders of the defined types were placed in this encounter. ? ? ?No follow-ups on file. ? ? ? ?This visit occurred during the SARS-CoV-2 public health emergency.  Safety protocols were in place, including screening questions prior to the visit, additional usage of staff PPE, and extensive cleaning of exam room while observing appropriate contact time as indicated for disinfecting solutions.  ? ?

## 2022-02-10 LAB — COMPLETE METABOLIC PANEL WITH GFR
AG Ratio: 1.7 (calc) (ref 1.0–2.5)
ALT: 15 U/L (ref 9–46)
AST: 21 U/L (ref 10–35)
Albumin: 3.8 g/dL (ref 3.6–5.1)
Alkaline phosphatase (APISO): 66 U/L (ref 35–144)
BUN: 13 mg/dL (ref 7–25)
CO2: 27 mmol/L (ref 20–32)
Calcium: 8.9 mg/dL (ref 8.6–10.3)
Chloride: 106 mmol/L (ref 98–110)
Creat: 0.77 mg/dL (ref 0.70–1.35)
Globulin: 2.3 g/dL (calc) (ref 1.9–3.7)
Glucose, Bld: 101 mg/dL — ABNORMAL HIGH (ref 65–99)
Potassium: 4.4 mmol/L (ref 3.5–5.3)
Sodium: 140 mmol/L (ref 135–146)
Total Bilirubin: 0.6 mg/dL (ref 0.2–1.2)
Total Protein: 6.1 g/dL (ref 6.1–8.1)
eGFR: 101 mL/min/{1.73_m2} (ref >=60)

## 2022-02-10 LAB — CBC WITH DIFFERENTIAL/PLATELET
Absolute Monocytes: 592 cells/uL (ref 200–950)
Basophils Absolute: 29 cells/uL (ref 0–200)
Basophils Relative: 0.5 %
Eosinophils Absolute: 168 cells/uL (ref 15–500)
Eosinophils Relative: 2.9 %
HCT: 40.2 % (ref 38.5–50.0)
Hemoglobin: 13.9 g/dL (ref 13.2–17.1)
Lymphs Abs: 1224 cells/uL (ref 850–3900)
MCH: 34.9 pg — ABNORMAL HIGH (ref 27.0–33.0)
MCHC: 34.6 g/dL (ref 32.0–36.0)
MCV: 101 fL — ABNORMAL HIGH (ref 80.0–100.0)
MPV: 9.8 fL (ref 7.5–12.5)
Monocytes Relative: 10.2 %
Neutro Abs: 3787 cells/uL (ref 1500–7800)
Neutrophils Relative %: 65.3 %
Platelets: 133 10*3/uL — ABNORMAL LOW (ref 140–400)
RBC: 3.98 10*6/uL — ABNORMAL LOW (ref 4.20–5.80)
RDW: 12.7 % (ref 11.0–15.0)
Total Lymphocyte: 21.1 %
WBC: 5.8 10*3/uL (ref 3.8–10.8)

## 2022-02-10 LAB — PSA: PSA: 0.3 ng/mL (ref <=4.00)

## 2022-02-10 LAB — LIPID PANEL W/REFLEX DIRECT LDL
Cholesterol: 157 mg/dL (ref <200)
HDL: 76 mg/dL (ref >=40)
LDL Cholesterol (Calc): 67 mg/dL (calc)
Non-HDL Cholesterol (Calc): 81 mg/dL (calc) (ref <130)
Total CHOL/HDL Ratio: 2.1 (calc) (ref <5.0)
Triglycerides: 68 mg/dL (ref <150)

## 2022-02-10 LAB — HEMOGLOBIN A1C
Hgb A1c MFr Bld: 5.4 % of total Hgb (ref <5.7)
Mean Plasma Glucose: 108 mg/dL
eAG (mmol/L): 6 mmol/L

## 2022-02-10 LAB — TSH: TSH: 1.27 mIU/L (ref 0.40–4.50)

## 2022-02-15 ENCOUNTER — Telehealth: Payer: Self-pay

## 2022-02-15 NOTE — Telephone Encounter (Signed)
Initiated Prior authorization ZBM:[id number] HCl '40MG'$  capsules ? ?Via: Covermymeds ?Case/Key:[id number] ?Status: approved  as of 02/15/22 ?Reason:this request is approved from 02/15/2022 to 02/14/2025. ?Notified Pt via: Mychart  ?

## 2022-03-02 ENCOUNTER — Ambulatory Visit (INDEPENDENT_AMBULATORY_CARE_PROVIDER_SITE_OTHER): Payer: BC Managed Care – PPO

## 2022-03-02 DIAGNOSIS — Z122 Encounter for screening for malignant neoplasm of respiratory organs: Secondary | ICD-10-CM

## 2022-03-02 DIAGNOSIS — F17210 Nicotine dependence, cigarettes, uncomplicated: Secondary | ICD-10-CM

## 2022-03-16 ENCOUNTER — Encounter: Payer: Self-pay | Admitting: *Deleted

## 2022-03-17 ENCOUNTER — Other Ambulatory Visit: Payer: Self-pay | Admitting: Cardiology

## 2022-03-24 ENCOUNTER — Ambulatory Visit (INDEPENDENT_AMBULATORY_CARE_PROVIDER_SITE_OTHER): Payer: BC Managed Care – PPO | Admitting: Physician Assistant

## 2022-03-24 ENCOUNTER — Telehealth: Payer: Self-pay | Admitting: *Deleted

## 2022-03-24 ENCOUNTER — Encounter: Payer: Self-pay | Admitting: Physician Assistant

## 2022-03-24 VITALS — BP 110/80 | HR 98 | Ht 70.0 in | Wt 157.0 lb

## 2022-03-24 DIAGNOSIS — R1310 Dysphagia, unspecified: Secondary | ICD-10-CM

## 2022-03-24 DIAGNOSIS — Z8719 Personal history of other diseases of the digestive system: Secondary | ICD-10-CM

## 2022-03-24 DIAGNOSIS — K219 Gastro-esophageal reflux disease without esophagitis: Secondary | ICD-10-CM

## 2022-03-24 DIAGNOSIS — Z86010 Personal history of colonic polyps: Secondary | ICD-10-CM | POA: Diagnosis not present

## 2022-03-24 DIAGNOSIS — K648 Other hemorrhoids: Secondary | ICD-10-CM

## 2022-03-24 MED ORDER — PLENVU 140 G PO SOLR: 1.0000 | ORAL | 0 refills | Status: DC

## 2022-03-24 NOTE — Progress Notes (Signed)
Subjective:    Patient ID: Jeremiah Short, male    DOB: [birth year], 61 y.o.   MRN: [id number]  HPI  Jeremiah Short is a 61 year old white male, known to Dr. Russella Dar who was last seen in 2021.  He comes in today with complaints of recurrent dysphagia. Patient has history of short segment Barrett's esophagus, last EGD November 2021 with 2 cm segment of Barrett's esophagus and diffuse moderate gastritis, small hiatal hernia.  Biopsy of the esophagus showed intestinal metaplasia consistent with Barrett's, no dysplasia. Also with history of multiple colon polyps.  Last colonoscopy December 2019 with 6 polyps removed all 6 to 8 mm in size and 2 other polyps in the descending and ascending colon 10 to 14 mm in size.  All of the polyps were tubular adenomas.  Noted during colonoscopy was a prior end-to-end anastomosis in the sigmoid and multiple pan diverticuli and grade 3 internal hemorrhoids.  He was indicated for 3-year interval follow-up.  Patient had EGD in January 2021 for dysphagia and was found to have a distal esophageal stricture which was Savary dilated to 16 mm.  Prior to that had esophageal dilation in 2015.  He is maintained on Prevacid 30 mg daily.  He says he generally does not have problems with heartburn or indigestion.  Now over the past 6 to 8 months he has had gradually worsening problems with dysphagia to the point that it is occurring with every meal.  He has episodes requiring regurgitation and other episodes with eventual passage of food bolus if he stops eating.  Most of his symptoms are all with solid foods, no difficulty with liquids currently or pills.  He is maintained on Plavix and aspirin with history of coronary artery disease status post MI and prior stents.  Last stent 2017 and plan was for lifelong dual antiplatelet therapy.  Followed by Dr. Anne Fu Most recent EF 45 to 50% Has history of COPD, no oxygen use, history of depression and chronic EtOH use. He underwent cholecystectomy in  May 2022, had subtotal cholecystectomy due to acute cholecystitis.\  He has no current GI complaints, bowel movements have been regular, no complaints of abdominal pain.  He is asking about hemorrhoidal banding, he continues to have intermittent discomfort when his hemorrhoids prolapse and intermittent bleeding.  He had undergone prior banding in 2021. Right posterior banded January 2021, right anterior banded December 2020, LL banded February 2021, and repeat band to RA March 2021    Review of Systems Pertinent positive and negative review of systems were noted in the above HPI section.  All other review of systems was otherwise negative.   Outpatient Encounter Medications as of 03/24/2022  Medication Sig   albuterol (VENTOLIN HFA) 108 (90 Base) MCG/ACT inhaler Inhale 2 puffs into the lungs every 6 (six) hours as needed for wheezing or shortness of breath.   aspirin EC 81 MG EC tablet Take 1 tablet (81 mg total) by mouth daily.   atomoxetine (STRATTERA) 40 MG capsule Take 40mg  daily x5 days then increase to 40mg  twice per day   atorvastatin (LIPITOR) 40 MG tablet TAKE 1 TABLET BY MOUTH DAILY AT 6 PM.   busPIRone (BUSPAR) 15 MG tablet Take 1 tablet (15 mg total) by mouth 2 (two) times daily.   clopidogrel (PLAVIX) 75 MG tablet Take 1 tablet (75 mg total) by mouth daily.   folic acid (FOLVITE) 1 MG tablet Take 1 tablet (1 mg total) by mouth daily.   isosorbide mononitrate (IMDUR) 60 MG  24 hr tablet Take 1 tablet (60 mg total) by mouth daily.   lansoprazole (PREVACID) 30 MG capsule TAKE 1 CAPSULE BY MOUTH DAILY BEFORE BREAKFAST.   metoprolol succinate (TOPROL-XL) 25 MG 24 hr tablet Take 0.5 tablets (12.5 mg total) by mouth daily.   Multiple Vitamin (MULTIVITAMIN) capsule Take 1 capsule by mouth daily.   nitroGLYCERIN (NITROSTAT) 0.4 MG SL tablet PLACE 1 TABLET UNDER THE TONGUE EVERY 5 MINUTES FOR 3 DOSES AS NEEDED FOR CHEST PAIN   PEG-KCl-NaCl-NaSulf-Na Asc-C (PLENVU) 140 g SOLR Take 1 kit by  mouth as directed. Use coupon: BIN: [id number] PNC: CNRX Group: [id number] ID: [id number]   thiamine 100 MG tablet Take 1 tablet (100 mg total) by mouth daily.   [DISCONTINUED] venlafaxine XR (EFFEXOR-XR) 150 MG 24 hr capsule Take 1 capsule (150 mg total) by mouth daily with breakfast.   No facility-administered encounter medications on file as of 03/24/2022.   Allergies  Allergen Reactions   Morphine And Related Other (See Comments)    dont remember the symptoms. Has tolerated dilaudid and oxycodone   Patient Active Problem List   Diagnosis Date Noted   Smoking greater than 20 pack years 02/09/2022   SIRS (systemic inflammatory response syndrome) (HCC) 02/24/2021   Coronary artery disease involving native coronary artery of native heart with angina pectoris (HCC) 02/24/2021   Chronic combined systolic (congestive) and diastolic (congestive) heart failure (HCC) 02/24/2021   Aortic atherosclerosis (HCC) 02/24/2021   Emphysema lung (HCC) 02/24/2021   Closed compression fracture of body of L1 vertebra (HCC) 02/24/2021   MDD (major depressive disorder) 02/02/2021   Excessive drinking alcohol 05/28/2020   Calculus of gallbladder without cholecystitis without obstruction 05/17/2020   Barrett's esophagus without dysplasia 05/17/2020   Macrocytic anemia 03/10/2020   Decreased appetite 03/10/2020   Fatigue 03/10/2020   Sprain of acromioclavicular ligament 09/25/2019   Low testosterone 04/30/2019   MDD (major depressive disorder), recurrent episode, moderate (HCC) 12/17/2018   Chronic bilateral low back pain without sciatica 06/17/2018   Seborrheic keratosis 05/30/2018   Well adult exam 05/06/2018   Nicotine dependence, cigarettes, uncomplicated 05/02/2018   Pure hypercholesterolemia    Anxiety    Social History   Socioeconomic History   Marital status: Divorced    Spouse name: Not on file   Number of children: 2   Years of education: Not on file   Highest education level: Not on file   Occupational History   Occupation: Location manager  Tobacco Use   Smoking status: Every Day    Packs/day: 0.75    Years: 40.00    Total pack years: 30.00    Types: Cigarettes   Smokeless tobacco: Never   Tobacco comments:    Pt given handout to quit smoking  Vaping Use   Vaping Use: Never used  Substance and Sexual Activity   Alcohol use: Not Currently    Comment: daily   Drug use: No   Sexual activity: Not on file  Other Topics Concern   Not on file  Social History Narrative   Not on file   Social Determinants of Health   Financial Resource Strain: Not on file  Food Insecurity: Not on file  Transportation Needs: Not on file  Physical Activity: Not on file  Stress: Not on file  Social Connections: Not on file  Intimate Partner Violence: Not on file    Mr. Gotz family history includes Breast cancer in his sister; Heart disease in his father; Lung cancer in his mother.  Objective:    Vitals:   03/24/22 1323  BP: 110/80  Pulse: 98    Physical Exam Well-developed well-nourished older WM in no acute distress.  Height, Weight,157  BMI 22.5  HEENT; nontraumatic normocephalic, EOMI, PE R LA, sclera anicteric. Oropharynx;not examined Neck; supple, no JVD Cardiovascular; regular rate and rhythm with S1-S2, no murmur rub or gallop Pulmonary; Clear bilaterally Abdomen; soft, nontender, nondistended, no palpable mass or hepatosplenomegaly, bowel sounds are active Rectal;not done today Skin; benign exam, no jaundice rash or appreciable lesions Extremities; no clubbing cyanosis or edema skin warm and dry Neuro/Psych; alert and oriented x4, grossly nonfocal mood and affect appropriate        Assessment & Plan:   #63 61 year old white male with history of chronic GERD, history of esophageal stricture requiring prior dilation who presents with recurrent solid food dysphagia progressive over the past 6 to 8 months Daily episodes currently some requiring  regurgitation  Symptoms are consistent with recurrent esophageal stricture  #2 Barrett's esophagus Short segment-last EGD for surveillance November 2021 #3 history of multiple adenomatous colon polyps last colonoscopy December 2019, overdue for follow-up #4 pandiverticulosis #5 prior sigmoid resection #6 grade 3 internal hemorrhoids-recurrence of symptoms with prolapse and bleeding-has undergone prior banding as outlined above 2000 22,021 #7 COPD no oxygen use 8.  Status post cholecystectomy May 2022 9.  Coronary artery disease status post stenting x2 on chronic Plavix and aspirin 10.  Left ventricular dysfunction with EF 45 to 50% #11 chronic EtOH use  Plan; Patient will be scheduled for upper endoscopy with probable esophageal dilation and colonoscopy with Dr. Russella Dar.  Both procedures were discussed in detail with the patient including indications risk and benefits and he is agreeable to proceed. Patient will need to stop Plavix for 5 days prior to procedures, we will communicate with his cardiologist Dr. Anne Fu to assure this is reasonable for this patient.  He can continue baby aspirin Continue Prevacid 30 mg p.o. every morning Patient is interested in repeat hemorrhoidal banding, this can be scheduled with Dr. Russella Dar in the office, once colonoscopy is completed.  Maxamillion Banas Oswald Hillock PA-C 03/24/2022   Cc: Everrett Coombe, DO

## 2022-03-24 NOTE — Progress Notes (Signed)
Given that his hemorrhoid symptoms have recurred following banding he is not an ideal candidate for repeat banding. There is a high likelihood that repeat banding will again fail within a couple years. Hemorrhoidectomy is the better option for a durable result. Will discuss further with him.

## 2022-03-30 ENCOUNTER — Other Ambulatory Visit: Payer: Self-pay

## 2022-03-30 MED ORDER — ALBUTEROL SULFATE HFA 108 (90 BASE) MCG/ACT IN AERS: 2.0000 | INHALATION_SPRAY | Freq: Four times a day (QID) | RESPIRATORY_TRACT | 0 refills | Status: DC | PRN

## 2022-03-30 NOTE — Telephone Encounter (Signed)
Spoke with pt regarding clearance and the need of a tele-visit for clearance. Pt is starting a new job and doesn't know his new schedule at the moment and asked if he could call back once he gets it. Pt was made aware that was fine, but the clearance couldn't be completed until the tele-visit is done. He verbalized understanding and stated he would call back.

## 2022-04-03 ENCOUNTER — Ambulatory Visit: Payer: BC Managed Care – PPO

## 2022-04-03 ENCOUNTER — Telehealth: Payer: Self-pay | Admitting: *Deleted

## 2022-04-03 ENCOUNTER — Telehealth: Payer: Self-pay | Admitting: Gastroenterology

## 2022-04-03 NOTE — Telephone Encounter (Signed)
Patient has been scheduled with Dr. Rush Landmark on 04/14/22 at 2:30 pm. Patient has been sent an updated copy of instructions. He has his cardiology appointment on 7/5 to discuss clearance then. Staff reminder sent to self to ensure patient has clearance prior to procedure.

## 2022-04-03 NOTE — Telephone Encounter (Signed)
I s/w the pt and we have moved his tele visit up sooner to 04/05/22 @ 3:40 pm as GI is going to move his procedure up sooner. Pt agreeable to change in plans.

## 2022-04-03 NOTE — Telephone Encounter (Addendum)
Pt agreeable to plan of care for tele visit 05/01/22 @ 1 pm. Med rec and consent are done. Pt states he starts his new job 04/05/22. He tells me that his sxms are getting worse and wanted to talk to Nicoletta Ba, PAC [facility]. I advised the pt the best thing to do is call their office and let them know that his sxms are getting worse. I did ask the pt to let our office know if his procedure is moved up sooner and if we have to move the tele appt up sooner. Pt is on Plavix and will need to allow time to hold Plavix. Pt thanked me for the call today.   I will update the requesting office as to the call today.

## 2022-04-03 NOTE — Telephone Encounter (Signed)
Patient called in asking to see if procedure could be moved up to an earlier time. He says he is unable to swallow most foods now, including a milkshake that he had yesterday. He is able to tolerate fluids. Patient has been rescheduled to the soonest time on 8/3 with Dr. Fuller Plan for endo colon. Will route to PA for any further recommendations.

## 2022-04-03 NOTE — Telephone Encounter (Signed)
Inbound call from patient inquiring if he can have his endo procedure prior to his colon procedure due to him feeling like he cannot get through the month. Patient is scheduled for both procedures 05/30/22. Patient would like to speak with a nurse to further advise him.   Thank you

## 2022-04-03 NOTE — Telephone Encounter (Signed)
Patient does not think he can wait till the end of the month for procedure. I have asked that he call & move up his appt with the cardiologist so that we can determine a sooner date for him based on his clearance.

## 2022-04-03 NOTE — Telephone Encounter (Signed)
  Patient Consent for Virtual Visit        Jeran Hiltz has provided verbal consent on 04/03/2022 for a virtual visit (video or telephone).   CONSENT FOR VIRTUAL VISIT FOR:  Jeremiah Short  By participating in this virtual visit I agree to the following:  I hereby voluntarily request, consent and authorize [facility] and its employed or contracted physicians, physician assistants, nurse practitioners or other licensed health care professionals (the Practitioner), to provide me with telemedicine health care services (the "Services") as deemed necessary by the treating Practitioner. I acknowledge and consent to receive the Services by the Practitioner via telemedicine. I understand that the telemedicine visit will involve communicating with the Practitioner through live audiovisual communication technology and the disclosure of certain medical information by electronic transmission. I acknowledge that I have been given the opportunity to request an in-person assessment or other available alternative prior to the telemedicine visit and am voluntarily participating in the telemedicine visit.  I understand that I have the right to withhold or withdraw my consent to the use of telemedicine in the course of my care at any time, without affecting my right to future care or treatment, and that the Practitioner or I may terminate the telemedicine visit at any time. I understand that I have the right to inspect all information obtained and/or recorded in the course of the telemedicine visit and may receive copies of available information for a reasonable fee.  I understand that some of the potential risks of receiving the Services via telemedicine include:  Delay or interruption in medical evaluation due to technological equipment failure or disruption; Information transmitted may not be sufficient (e.g. poor resolution of images) to allow for appropriate medical decision making by the Practitioner; and/or   In rare instances, security protocols could fail, causing a breach of personal health information.  Furthermore, I acknowledge that it is my responsibility to provide information about my medical history, conditions and care that is complete and accurate to the best of my ability. I acknowledge that Practitioner's advice, recommendations, and/or decision may be based on factors not within their control, such as incomplete or inaccurate data provided by me or distortions of diagnostic images or specimens that may result from electronic transmissions. I understand that the practice of medicine is not an exact science and that Practitioner makes no warranties or guarantees regarding treatment outcomes. I acknowledge that a copy of this consent can be made available to me via my patient portal ([facility]), or I can request a printed copy by calling the office of [facility].    I understand that my insurance will be billed for this visit.   I have read or had this consent read to me. I understand the contents of this consent, which adequately explains the benefits and risks of the Services being provided via telemedicine.  I have been provided ample opportunity to ask questions regarding this consent and the Services and have had my questions answered to my satisfaction. I give my informed consent for the services to be provided through the use of telemedicine in my medical care

## 2022-04-03 NOTE — Telephone Encounter (Signed)
Patient called back to say that his appt need to be change because his surgery date has change. Please advise

## 2022-04-03 NOTE — Telephone Encounter (Signed)
Pt agreeable to plan of care for tele visit 05/01/22 @ 1 pm. Med rec and consent are done. Pt states he starts his new job 04/05/22. He tells me that his sxms are getting worse and wanted to talk to Nicoletta Ba, PAC [facility]. I advised the pt the best thing to do is call their office and let them know that his sxms are getting worse. I did ask the pt to let our office know if his procedure is moved up sooner and if we have to move the tele appt up sooner. Pt is on Plavix and will need to allow time to hold Plavix. Pt thanked me for the call today.    I will update the requesting office as to the call today.

## 2022-04-05 ENCOUNTER — Ambulatory Visit (INDEPENDENT_AMBULATORY_CARE_PROVIDER_SITE_OTHER): Payer: BC Managed Care – PPO | Admitting: Nurse Practitioner

## 2022-04-05 DIAGNOSIS — Z01810 Encounter for preprocedural cardiovascular examination: Secondary | ICD-10-CM

## 2022-04-05 NOTE — Progress Notes (Signed)
Virtual Visit via Telephone Note   Because of Jeremiah Short's co-morbid illnesses, he is at least at moderate risk for complications without adequate follow up.  This format is felt to be most appropriate for this patient at this time.  The patient did not have access to video technology/had technical difficulties with video requiring transitioning to audio format only (telephone).  All issues noted in this document were discussed and addressed.  No physical exam could be performed with this format.  Please refer to the patient's chart for his consent to telehealth for [facility].  Evaluation Performed:  Preoperative cardiovascular risk assessment _____________   Date:  04/05/2022   Patient ID:  Jeremiah Short, DOB [birth year], MRN [id number] Patient Location:  Home Provider location:   Office  Primary Care Provider:  Luetta Nutting, DO Primary Cardiologist:  Candee Furbish, MD  Chief Complaint / Patient Profile   61 y.o. y/o male with a h/o CAD s/p DES-LAD in 2009 and 2017, chronic combined systolic and diastolic heart failure, aortic atherosclerosis, hyperlipidemia, emphysema, barrett's esophagus, and tobacco use who is pending endoscopy/colonoscopy on 04/14/2022 with [facility] and presents today for telephonic preoperative cardiovascular risk assessment.  Past Medical History    Past Medical History:  Diagnosis Date   Anxiety    Aortic atherosclerosis ([place]) 02/24/2021   Barrett's esophagus    Cardiomyopathy in other diseases classified elsewhere    Closed compression fracture of body of L1 vertebra ([place]) 02/24/2021   Coronary artery disease    Diverticulosis    Emphysema lung ([place]) 02/24/2021   Esophageal stricture    GERD (gastroesophageal reflux disease)    Hiatal hernia    History of ETOH abuse    Hypertension    Major depressive disorder    Myocardial infarction [facility]) 2009   Pure hypercholesterolemia    Smoker    Stented coronary artery    Tubular  adenoma of colon 2012   Wears glasses    Past Surgical History:  Procedure Laterality Date   APPENDECTOMY  2011   during colectomy   CARDIAC CATHETERIZATION  2009   placed 2 stents   CARDIAC CATHETERIZATION N/A 09/27/2016   Procedure: Left Heart Cath and Coronary Angiography;  Surgeon: [name], MD;  Location: [place] [facility];  Service: Cardiovascular;  Laterality: N/A;   CARDIAC CATHETERIZATION N/A 09/27/2016   Procedure: Coronary Stent Intervention;  Surgeon: [name], MD;  Location: [place] [facility];  Service: Cardiovascular;  Laterality: N/A;   CHOLECYSTECTOMY N/A 02/27/2021   Procedure: LAPAROSCOPIC CHOLECYSTECTOMY;  Surgeon: Erroll Luna, MD;  Location: [facility];  Service: General;  Laterality: N/A;   CORONARY STENT PLACEMENT     HERNIA REPAIR     INGUINAL HERNIA REPAIR Left 09/16/2013   Procedure: HERNIA REPAIR INGUINAL ADULT;  Surgeon: [name], MD;  Location: [place];  Service: General;  Laterality: Left;   MOUTH SURGERY     PARTIAL COLECTOMY  2011   sigmoid   TONSILLECTOMY     WISDOM TOOTH EXTRACTION      Allergies  Allergies  Allergen Reactions   Morphine And Related Other (See Comments)    dont remember the symptoms. Has tolerated dilaudid and oxycodone    History of Present Illness    Jeremiah Short is a 61 y.o. male who presents via audio/video conferencing for a telehealth visit today.  Pt was last seen in cardiology clinic on 09/01/2021 by Dr. [name]. At that time Jeremiah Short was doing well  from a cardiac standpoint. He did report intermittent dizziness in the setting of low BP.  He was started on Toprol-XL 12.5 mg daily. The patient is now pending procedure as outlined above. Since his last visit, he has been stable overall from a cardiac standpoint.  He did have an isolated episode of chest discomfort in February 2023 which prompted an ED visit.  EKG was unremarkable, chest x-ray was unremarkable, troponin was negative.  He has  not had any recurrent symptoms since. He denies any recent chest pain, palpitations, dyspnea, pnd, orthopnea, n, v, dizziness, syncope, edema, weight gain, or early satiety. All other systems reviewed and are otherwise negative except as noted above.   Home Medications    Prior to Admission medications   Medication Sig Start Date End Date Taking? Authorizing Provider  albuterol (VENTOLIN HFA) 108 (90 Base) MCG/ACT inhaler Inhale 2 puffs into the lungs every 6 (six) hours as needed for wheezing or shortness of breath. 03/30/22   Luetta Nutting, DO  aspirin EC 81 MG EC tablet Take 1 tablet (81 mg total) by mouth daily. 09/30/16   Erlene Quan, PA-C  atomoxetine (STRATTERA) 40 MG capsule Take 72m daily x5 days then increase to 432mtwice per day Patient taking differently: 40 mg 2 (two) times daily with a meal. 02/09/22   MaLuetta NuttingDO  atorvastatin (LIPITOR) 40 MG tablet TAKE 1 TABLET BY MOUTH DAILY AT 6 PM. 02/01/22   MaLuetta NuttingDO  busPIRone (BUSPAR) 15 MG tablet Take 1 tablet (15 mg total) by mouth 2 (two) times daily. Patient taking differently: Take 15 mg by mouth daily. 06/07/21   MaLuetta NuttingDO  clopidogrel (PLAVIX) 75 MG tablet Take 1 tablet (75 mg total) by mouth daily. 09/29/21   SkJerline PainMD  folic acid (FOLVITE) 1 MG tablet Take 1 tablet (1 mg total) by mouth daily. 03/02/21   GoSamuella CotaMD  isosorbide mononitrate (IMDUR) 60 MG 24 hr tablet Take 1 tablet (60 mg total) by mouth daily. 09/29/21   SkJerline PainMD  lansoprazole (PREVACID) 30 MG capsule TAKE 1 CAPSULE BY MOUTH DAILY BEFORE BREAKFAST. 01/30/22   SkJerline PainMD  metoprolol succinate (TOPROL-XL) 25 MG 24 hr tablet Take 0.5 tablets (12.5 mg total) by mouth daily. 09/01/21   SkJerline PainMD  Multiple Vitamin (MULTIVITAMIN) capsule Take 1 capsule by mouth daily.    [provider]  nitroGLYCERIN (NITROSTAT) 0.4 MG SL tablet PLACE 1 TABLET UNDER THE TONGUE EVERY 5 MINUTES FOR 3 DOSES AS NEEDED  FOR CHEST PAIN 03/21/22   SkJerline PainMD  PEG-KCl-NaCl-NaSulf-Na Asc-C (PLENVU) 140 g SOLR Take 1 kit by mouth as directed. Use coupon: BIN: [id number]: CNRX Group: [id number]: [id number]/23/23   Esterwood, Amy S, PA-C  thiamine 100 MG tablet Take 1 tablet (100 mg total) by mouth daily. 03/02/21   GoSamuella CotaMD    Physical Exam    Vital Signs:  MaEthelle Lyonoes not have vital signs available for review today.  Given telephonic nature of communication, physical exam is limited. AAOx3. NAD. Normal affect.  Speech and respirations are unlabored.  Accessory Clinical Findings    None  Assessment & Plan    1.  Preoperative Cardiovascular Risk Assessment: According to the Revised Cardiac Risk Index (RCRI), his Perioperative Risk of Major Cardiac Event is (%): 6.6. His Functional Capacity in METs is: 8.33 according to the Duke Activity Status Index (DASI). Therefore, based  on ACC/AHA guidelines, patient would be at acceptable risk for the planned procedure without further cardiovascular testing.  Per office protocol, patient may hold Plavix for 5 days prior to procedure. Please resume Plavix as soon as possible postprocedure, at the discretion of the surgeon. We recommend continuing aspirin throughout the perioperative period.   A copy of this note will be routed to requesting surgeon.  Time:   Today, I have spent 8 minutes with the patient with telehealth technology discussing medical history, symptoms, and management plan.     Lenna Sciara, NP  04/05/2022, 3:52 PM

## 2022-04-06 ENCOUNTER — Ambulatory Visit: Payer: BC Managed Care – PPO | Admitting: Family Medicine

## 2022-04-06 NOTE — Telephone Encounter (Signed)
Spoke with patient & he is aware to hold blood thinner 5 days prior to procedure. He was also provided this information yesterday at his cardiologist appointment. Patient advised to call back with any questions.

## 2022-04-08 NOTE — Telephone Encounter (Signed)
GM, FYI.

## 2022-04-10 NOTE — Telephone Encounter (Signed)
He is scheduled for an EGD and colonoscopy.

## 2022-04-11 ENCOUNTER — Encounter: Payer: Self-pay | Admitting: Certified Registered Nurse Anesthetist

## 2022-04-11 NOTE — Telephone Encounter (Signed)
Great. Jeremiah Short

## 2022-04-14 ENCOUNTER — Encounter: Payer: Self-pay | Admitting: Gastroenterology

## 2022-04-14 ENCOUNTER — Ambulatory Visit (AMBULATORY_SURGERY_CENTER): Payer: BC Managed Care – PPO | Admitting: Gastroenterology

## 2022-04-14 VITALS — BP 124/86 | HR 107 | Temp 98.0°F | Resp 12 | Ht 70.0 in | Wt 157.0 lb

## 2022-04-14 DIAGNOSIS — K219 Gastro-esophageal reflux disease without esophagitis: Secondary | ICD-10-CM | POA: Diagnosis not present

## 2022-04-14 DIAGNOSIS — D123 Benign neoplasm of transverse colon: Secondary | ICD-10-CM | POA: Diagnosis not present

## 2022-04-14 DIAGNOSIS — R1310 Dysphagia, unspecified: Secondary | ICD-10-CM

## 2022-04-14 DIAGNOSIS — K2970 Gastritis, unspecified, without bleeding: Secondary | ICD-10-CM | POA: Diagnosis not present

## 2022-04-14 DIAGNOSIS — K6389 Other specified diseases of intestine: Secondary | ICD-10-CM

## 2022-04-14 DIAGNOSIS — K3189 Other diseases of stomach and duodenum: Secondary | ICD-10-CM

## 2022-04-14 DIAGNOSIS — K2270 Barrett's esophagus without dysplasia: Secondary | ICD-10-CM | POA: Diagnosis not present

## 2022-04-14 DIAGNOSIS — Z09 Encounter for follow-up examination after completed treatment for conditions other than malignant neoplasm: Secondary | ICD-10-CM | POA: Diagnosis not present

## 2022-04-14 DIAGNOSIS — D127 Benign neoplasm of rectosigmoid junction: Secondary | ICD-10-CM | POA: Diagnosis not present

## 2022-04-14 DIAGNOSIS — K225 Diverticulum of esophagus, acquired: Secondary | ICD-10-CM | POA: Diagnosis not present

## 2022-04-14 DIAGNOSIS — Z86010 Personal history of colonic polyps: Secondary | ICD-10-CM | POA: Diagnosis not present

## 2022-04-14 MED ORDER — LANSOPRAZOLE 30 MG PO CPDR: 30.0000 mg | DELAYED_RELEASE_CAPSULE | Freq: Two times a day (BID) | ORAL | 0 refills | Status: DC

## 2022-04-14 MED ORDER — SODIUM CHLORIDE 0.9 % IV SOLN: 500.0000 mL | Freq: Once | INTRAVENOUS | Status: DC

## 2022-04-14 NOTE — Progress Notes (Signed)
Brief GI progress note Patient with pre-procedure Hrs in the 110s. See Anesthesia note for full details of medications administered. Patient has received 100 mg Esmolol during procedure as well 1L of fluids for relative dehydration. Will plan to monitor in post-procedure.   Addendum Patient evaluated and Hrs in the 100s.  Hemodynamically stable.  Have discussed with patient's sister who is PA and they will monitor. I think 12/5 mg of Metoprolol (1/2 his daily dose) is reasonable for him to take this afternoon to get his Hrs under control. If he has evidence of Palpitations/CP/Cpressure/Lightheadedness/Dizziness, needs his BP and Hrs to be checked but may need to go to the hospital for further workup/management.   Justice Britain, MD [facility] Advanced [facility]oscopy Office # [phone]

## 2022-04-14 NOTE — Op Note (Signed)
[place] Patient Name: Jeremiah Short Procedure Date: 04/14/2022 2:10 PM MRN: [id number] Endoscopist: Justice Britain , MD Age: 61 Referring MD:  Date of Birth: 03/19/1961 Gender: Male Account #: [id number] Procedure:                Upper GI endoscopy Indications:              Dysphagia Medicines:                Monitored Anesthesia Care Procedure:                Pre-Anesthesia Assessment:                           - Prior to the procedure, a History and Physical                            was performed, and patient medications and                            allergies were reviewed. The patient's tolerance of                            previous anesthesia was also reviewed. The risks                            and benefits of the procedure and the sedation                            options and risks were discussed with the patient.                            All questions were answered, and informed consent                            was obtained. Prior Anticoagulants: The patient has                            taken Plavix (clopidogrel), last dose was 5 days                            prior to procedure. ASA Grade Assessment: III - A                            patient with severe systemic disease. After                            reviewing the risks and benefits, the patient was                            deemed in satisfactory condition to undergo the                            procedure.  After obtaining informed consent, the endoscope was                            passed under direct vision. Throughout the                            procedure, the patient's blood pressure, pulse, and                            oxygen saturations were monitored continuously. The                            GIF [id number] #[id number] was introduced through the                            mouth, and advanced to the second part of duodenum.                             The upper GI endoscopy was accomplished without                            difficulty. The patient tolerated the procedure. Scope In: Scope Out: Findings:                 The lumen of the esophagus was moderately dilated.                           Diffuse severe mucosal changes characterized by                            discoloration, sloughing and altered texture were                            found in the entire esophagus. Biopsies were taken                            with a cold forceps for histology to rule out                            EoE/LoE.                           A non-bleeding diverticulum with a small opening                            was found in the distal esophagus.                           There were esophageal mucosal changes consistent                            with known short-segment Barrett's esophagus                            present in the distal esophagus.  The maximum                            longitudinal extent of these mucosal changes was 1                            cm in length. Biopsies were taken with a cold                            forceps for histology.                           The Z-line was irregular and was found 40 cm from                            the incisors.                           After the rest of the EGD was completed, a                            guidewire was placed and the scope was withdrawn.                            Dilation was performed in the esophagus with a                            Savary dilator with mild resistance at 15 mm and                            moderate resistance at 16 mm. The dilation site was                            examined following endoscope reinsertion and showed                            mild mucosal disruption, mild improvement in                            luminal narrowing and no perforation in the distal                            esophagus.                           A 2 cm hiatal  hernia was present.                           Patchy moderate inflammation characterized by                            erythema and granularity was found in the entire  examined stomach. The beginnings of portal                            gastropathy could also have this appearance.                            Biopsies were taken with a cold forceps for                            histology and Helicobacter pylori testing.                           No gross lesions were noted in the duodenal bulb,                            in the first portion of the duodenum and in the                            second portion of the duodenum. Complications:            No immediate complications. Estimated Blood Loss:     Estimated blood loss was minimal. Impression:               - Discolored, texture changed mucosa in the                            esophagus. Biopsied.                           - Diverticulum in the distal esophagus.                           - Esophageal mucosal changes consistent with                            short-segment Barrett's esophagus. Biopsied.                           - Z-line irregular, 40 cm from the incisors.                           - Dilation in the entire esophagus.                           - 2 cm hiatal hernia.                           - Gastritis - not clearly portal gastropathy, but                            there is some potential for development of this.                            Biopsied.                           -  No gross lesions in the duodenal bulb, in the                            first portion of the duodenum and in the second                            portion of the duodenum. Recommendation:           - Proceed to scheduled colonoscopy.                           - Plavix to be discussed on Colonoscopy report.                           - Dilation diet as per protocol.                           - Increase to Prevacid  twice daily for 62-month                            Then may return to once daily.                           - Please use Cepacol or Halls Lozenges +/-                            Chloraseptic spray for next 72-96 hours to aid in                            sore thoat should you experience this.                           - If dysphagia symptoms persist post-dilation or                            return quickly, based on endoscopic appearance of                            dilation, I think dysmotility as well as the                            possible esophageal diverticulum is certainly a                            possibility of causing him issues as well. -                            Recommend Barium swallow and Manometry to be                            considered by primary GI team.                           - The findings and recommendations were discussed  with the patient.                           - The findings and recommendations were discussed                            with the patient's family. Justice Britain, MD 04/14/2022 3:02:12 PM

## 2022-04-14 NOTE — Progress Notes (Signed)
1409 Robinul 0.1 mg IV given due large amount of secretions upon assessment.  MD made aware, vss  ?

## 2022-04-14 NOTE — Progress Notes (Signed)
1430 HR > 100 with esmolol 25 mg given IV, MD updated, vss  

## 2022-04-14 NOTE — Progress Notes (Signed)
1420 HR > 100 with esmolol 25 mg given IV, MD updated, vss

## 2022-04-14 NOTE — Progress Notes (Signed)
GASTROENTEROLOGY PROCEDURE H&P NOTE   Primary Care Physician: Luetta Nutting, DO  HPI: Jeremiah Short is a 61 y.o. male who presents for EGD/Colonoscopy for evaluation of Dysphagia, Barrett's, Adenomatous colon polyp surveillance.  Past Medical History:  Diagnosis Date   Anxiety    Aortic atherosclerosis ([place]) 02/24/2021   Barrett's esophagus    Cardiomyopathy in other diseases classified elsewhere    Closed compression fracture of body of L1 vertebra ([place]) 02/24/2021   Coronary artery disease    Diverticulosis    Emphysema lung ([place]) 02/24/2021   Esophageal stricture    GERD (gastroesophageal reflux disease)    Hiatal hernia    History of ETOH abuse    Hypertension    Major depressive disorder    Myocardial infarction [facility]) 2009   Pure hypercholesterolemia    Smoker    Stented coronary artery    Tubular adenoma of colon 2012   Wears glasses    Past Surgical History:  Procedure Laterality Date   APPENDECTOMY  2011   during colectomy   CARDIAC CATHETERIZATION  2009   placed 2 stents   CARDIAC CATHETERIZATION N/A 09/27/2016   Procedure: Left Heart Cath and Coronary Angiography;  Surgeon: [name], MD;  Location: [place] [facility];  Service: Cardiovascular;  Laterality: N/A;   CARDIAC CATHETERIZATION N/A 09/27/2016   Procedure: Coronary Stent Intervention;  Surgeon: [name], MD;  Location: [place] [facility];  Service: Cardiovascular;  Laterality: N/A;   CHOLECYSTECTOMY N/A 02/27/2021   Procedure: LAPAROSCOPIC CHOLECYSTECTOMY;  Surgeon: Erroll Luna, MD;  Location: [facility];  Service: General;  Laterality: N/A;   CORONARY STENT PLACEMENT     HERNIA REPAIR     INGUINAL HERNIA REPAIR Left 09/16/2013   Procedure: HERNIA REPAIR INGUINAL ADULT;  Surgeon: [name], MD;  Location: [place];  Service: General;  Laterality: Left;   MOUTH SURGERY     PARTIAL COLECTOMY  2011   sigmoid   TONSILLECTOMY     WISDOM TOOTH EXTRACTION     Current  Outpatient Medications  Medication Sig Dispense Refill   aspirin EC 81 MG EC tablet Take 1 tablet (81 mg total) by mouth daily.     atomoxetine (STRATTERA) 40 MG capsule Take '40mg'$  daily x5 days then increase to '40mg'$  twice per day (Patient taking differently: 40 mg 2 (two) times daily with a meal.) 60 capsule 2   atorvastatin (LIPITOR) 40 MG tablet TAKE 1 TABLET BY MOUTH DAILY AT 6 PM. 90 tablet 1   busPIRone (BUSPAR) 15 MG tablet Take 1 tablet (15 mg total) by mouth 2 (two) times daily. (Patient taking differently: Take 15 mg by mouth daily.) 60 tablet 3   isosorbide mononitrate (IMDUR) 60 MG 24 hr tablet Take 1 tablet (60 mg total) by mouth daily. 90 tablet 3   lansoprazole (PREVACID) 30 MG capsule TAKE 1 CAPSULE BY MOUTH DAILY BEFORE BREAKFAST. 90 capsule 2   metoprolol succinate (TOPROL-XL) 25 MG 24 hr tablet Take 0.5 tablets (12.5 mg total) by mouth daily. 45 tablet 3   Multiple Vitamin (MULTIVITAMIN) capsule Take 1 capsule by mouth daily.     thiamine 100 MG tablet Take 1 tablet (100 mg total) by mouth daily.     albuterol (VENTOLIN HFA) 108 (90 Base) MCG/ACT inhaler Inhale 2 puffs into the lungs every 6 (six) hours as needed for wheezing or shortness of breath. 18 g 0   clopidogrel (PLAVIX) 75 MG tablet Take 1 tablet (75 mg total) by  mouth daily. 90 tablet 3   folic acid (FOLVITE) 1 MG tablet Take 1 tablet (1 mg total) by mouth daily.     nitroGLYCERIN (NITROSTAT) 0.4 MG SL tablet PLACE 1 TABLET UNDER THE TONGUE EVERY 5 MINUTES FOR 3 DOSES AS NEEDED FOR CHEST PAIN 25 tablet 5   Current Facility-Administered Medications  Medication Dose Route Frequency Provider Last Rate Last Admin   0.9 %  sodium chloride infusion  500 mL Intravenous Once Mansouraty, Telford Nab., MD        Current Outpatient Medications:    aspirin EC 81 MG EC tablet, Take 1 tablet (81 mg total) by mouth daily., Disp: , Rfl:    atomoxetine (STRATTERA) 40 MG capsule, Take '40mg'$  daily x5 days then increase to '40mg'$  twice per  day (Patient taking differently: 40 mg 2 (two) times daily with a meal.), Disp: 60 capsule, Rfl: 2   atorvastatin (LIPITOR) 40 MG tablet, TAKE 1 TABLET BY MOUTH DAILY AT 6 PM., Disp: 90 tablet, Rfl: 1   busPIRone (BUSPAR) 15 MG tablet, Take 1 tablet (15 mg total) by mouth 2 (two) times daily. (Patient taking differently: Take 15 mg by mouth daily.), Disp: 60 tablet, Rfl: 3   isosorbide mononitrate (IMDUR) 60 MG 24 hr tablet, Take 1 tablet (60 mg total) by mouth daily., Disp: 90 tablet, Rfl: 3   lansoprazole (PREVACID) 30 MG capsule, TAKE 1 CAPSULE BY MOUTH DAILY BEFORE BREAKFAST., Disp: 90 capsule, Rfl: 2   metoprolol succinate (TOPROL-XL) 25 MG 24 hr tablet, Take 0.5 tablets (12.5 mg total) by mouth daily., Disp: 45 tablet, Rfl: 3   Multiple Vitamin (MULTIVITAMIN) capsule, Take 1 capsule by mouth daily., Disp: , Rfl:    thiamine 100 MG tablet, Take 1 tablet (100 mg total) by mouth daily., Disp: , Rfl:    albuterol (VENTOLIN HFA) 108 (90 Base) MCG/ACT inhaler, Inhale 2 puffs into the lungs every 6 (six) hours as needed for wheezing or shortness of breath., Disp: 18 g, Rfl: 0   clopidogrel (PLAVIX) 75 MG tablet, Take 1 tablet (75 mg total) by mouth daily., Disp: 90 tablet, Rfl: 3   folic acid (FOLVITE) 1 MG tablet, Take 1 tablet (1 mg total) by mouth daily., Disp: , Rfl:    nitroGLYCERIN (NITROSTAT) 0.4 MG SL tablet, PLACE 1 TABLET UNDER THE TONGUE EVERY 5 MINUTES FOR 3 DOSES AS NEEDED FOR CHEST PAIN, Disp: 25 tablet, Rfl: 5  Current Facility-Administered Medications:    0.9 %  sodium chloride infusion, 500 mL, Intravenous, Once, Mansouraty, Telford Nab., MD Allergies  Allergen Reactions   Morphine And Related Other (See Comments)    dont remember the symptoms. Has tolerated dilaudid and oxycodone   Family History  Problem Relation Age of Onset   Lung cancer Mother    Heart disease Father        has pacemaker   Breast cancer Sister    Colon cancer Neg Hx    Colon polyps Neg Hx    Esophageal  cancer Neg Hx    Kidney disease Neg Hx    Diabetes Neg Hx    Stomach cancer Neg Hx    Rectal cancer Neg Hx    Social History   Socioeconomic History   Marital status: Divorced    Spouse name: Not on file   Number of children: 2   Years of education: Not on file   Highest education level: Not on file  Occupational History   Occupation: Glass blower/designer  Tobacco Use  Smoking status: Every Day    Packs/day: 0.75    Years: 40.00    Total pack years: 30.00    Types: Cigarettes   Smokeless tobacco: Never   Tobacco comments:    Pt given handout to quit smoking  Vaping Use   Vaping Use: Never used  Substance and Sexual Activity   Alcohol use: Not Currently    Comment: daily   Drug use: No   Sexual activity: Not on file  Other Topics Concern   Not on file  Social History Narrative   Not on file   Social Determinants of Health   Financial Resource Strain: Not on file  Food Insecurity: Not on file  Transportation Needs: Not on file  Physical Activity: Not on file  Stress: Not on file  Social Connections: Not on file  Intimate Partner Violence: Not on file    Physical Exam: Today's Vitals   04/14/22 1340 04/14/22 1341  BP: 140/85   Pulse: (!) 117   Temp: 98 F (36.7 C) 98 F (36.7 C)  TempSrc: Skin   SpO2: 99%   Weight: 157 lb (71.2 kg)   Height: '5\' 10"'$  (1.778 m)    Body mass index is 22.53 kg/m. GEN: NAD EYE: Sclerae anicteric ENT: MMM CV: Tachycardic into the 110s pre-procedure. GI: Soft, NT/ND NEURO:  Alert & Oriented x 3  Lab Results: No results for input(s): "WBC", "HGB", "HCT", "PLT" in the last 72 hours. BMET No results for input(s): "NA", "K", "CL", "CO2", "GLUCOSE", "BUN", "CREATININE", "CALCIUM" in the last 72 hours. LFT No results for input(s): "PROT", "ALBUMIN", "AST", "ALT", "ALKPHOS", "BILITOT", "BILIDIR", "IBILI" in the last 72 hours. PT/INR No results for input(s): "LABPROT", "INR" in the last 72 hours.   Impression / Plan: This is  a 61 y.o.male who presents for EGD/Colonoscopy for evaluation of Dysphagia, Barrett's, Adenomatous colon polyp surveillance.  The risks and benefits of endoscopic evaluation/treatment were discussed with the patient and/or family; these include but are not limited to the risk of perforation, infection, bleeding, missed lesions, lack of diagnosis, severe illness requiring hospitalization, as well as anesthesia and sedation related illnesses.  The patient's history has been reviewed, patient examined, no change in status, and deemed stable for procedure.  The patient and/or family is agreeable to proceed.    Justice Britain, MD [facility] Office # [phone]

## 2022-04-14 NOTE — Op Note (Addendum)
[place] Patient Name: Jeremiah Short Procedure Date: 04/14/2022 2:09 PM MRN: [id number] Endoscopist: Justice Britain , MD Age: 61 Referring MD:  Date of Birth: 12-01-1960 Gender: Male Account #: [id number] Procedure:                Colonoscopy Indications:              Surveillance: Personal history of adenomatous                            polyps on last colonoscopy > 3 years ago Medicines:                Monitored Anesthesia Care Procedure:                Pre-Anesthesia Assessment:                           - Prior to the procedure, a History and Physical                            was performed, and patient medications and                            allergies were reviewed. The patient's tolerance of                            previous anesthesia was also reviewed. The risks                            and benefits of the procedure and the sedation                            options and risks were discussed with the patient.                            All questions were answered, and informed consent                            was obtained. Prior Anticoagulants: The patient has                            taken Plavix (clopidogrel), last dose was 5 days                            prior to procedure. ASA Grade Assessment: III - A                            patient with severe systemic disease. After                            reviewing the risks and benefits, the patient was                            deemed in satisfactory condition to undergo the  procedure.                           After obtaining informed consent, the colonoscope                            was passed under direct vision. Throughout the                            procedure, the patient's blood pressure, pulse, and                            oxygen saturations were monitored continuously. The                            Olympus PCF-H190DL (#[id number]) Colonoscope was                             introduced through the anus and advanced to the 3                            cm into the ileum. The colonoscopy was performed                            without difficulty. The patient tolerated the                            procedure. The quality of the bowel preparation was                            adequate. The terminal ileum, ileocecal valve,                            appendiceal orifice, and rectum were photographed. Scope In: 2:36:54 PM Scope Out: 1:74:94 PM Scope Withdrawal Time: 0 hours 10 minutes 24 seconds  Total Procedure Duration: 0 hours 12 minutes 24 seconds  Findings:                 The digital rectal exam findings include                            hemorrhoids. Pertinent negatives include no                            palpable rectal lesions.                           The terminal ileum and ileocecal valve appeared                            normal.                           Three sessile polyps were found in the  recto-sigmoid colon (1) and transverse colon (2).                            The polyps were 3 to 7 mm in size. These polyps                            were removed with a cold snare. Resection and                            retrieval were complete.                           Many small and large-mouthed diverticula were found                            in the entire colon.                           Patchy areas of granular mucosa were found in the                            proximal descending colon, in the transverse colon                            and in the distal ascending colon. Biopsies were                            taken with a cold forceps for histology.                           Normal mucosa was found in the entire colon                            otherwise.                           Non-bleeding non-thrombosed external and internal                            hemorrhoids were found during  retroflexion, during                            perianal exam and during digital exam. The                            hemorrhoids were Grade II (internal hemorrhoids                            that prolapse but reduce spontaneously). Complications:            No immediate complications. Estimated Blood Loss:     Estimated blood loss was minimal. Impression:               - Hemorrhoids found on digital rectal exam.                           -  The examined portion of the ileum was normal.                           - Three 3 to 7 mm polyps at the recto-sigmoid colon                            and in the transverse colon, removed with a cold                            snare. Resected and retrieved.                           - Diverticulosis in the entire examined colon.                           - Granularity in the proximal descending colon, in                            the transverse colon and in the distal ascending                            colon. Biopsied.                           - Normal mucosa in the entire examined colon.                           - Non-bleeding non-thrombosed external and internal                            hemorrhoids. Recommendation:           - Patient with HRs pre-proceudre in the 110s but                            otherwise hemodynamically stable. He has not taken                            his BB for 2-days. He has a history of alcohol use                            as well. He required 100 mg Esmolol for his                            procedure. He received 1L of IVFs during his                            procedure due to dehydration. He left the procedure                            room in hemodynamic stability with HRs still in the                            120s. Will  re-evaluate in the post-procedure room                            to decide ability to be discharged safely after                            discussion with sister/family.                            - The patient will be observed post-procedure,                            until all discharge criteria are met.                           - Discharge patient to home.                           - Patient has a contact number available for                            emergencies. The signs and symptoms of potential                            delayed complications were discussed with the                            patient. Return to normal activities tomorrow.                            Written discharge instructions were provided to the                            patient.                           - High fiber diet.                           - Use FiberCon 1-2 tablets PO daily.                           - May restart Plavix in 48 hours (7/16).                           If still elevated HRs, will recommend, checking his                            BP at home and then likely taking 1/2 dose of                            Metoprolol this afternoon (rather than his full                            dose since he received Esmolol earlier) if  we                            discharge him.                           - Continue present medications otherwise.                           - Await pathology results.                           - Repeat colonoscopy in 3/5/7 years for                            surveillance based on pathology results.                           - The findings and recommendations were discussed                            with the patient.                           - The findings and recommendations were discussed                            with the patient's family. Justice Britain, MD 04/14/2022 3:08:17 PM

## 2022-04-14 NOTE — Progress Notes (Signed)
1414 HR > 100 with esmolol 25 mg given IV, MD updated, vss

## 2022-04-14 NOTE — Progress Notes (Signed)
1417 HR > 100 with esmolol 25 mg given IV, MD updated, vss

## 2022-04-14 NOTE — Progress Notes (Signed)
VS by Alli  Pt's states no medical or surgical changes since previsit or office visit.

## 2022-04-14 NOTE — Progress Notes (Signed)
1425 20 gauge IV placed due dehydration, MD aware. HR continues to >100. vss

## 2022-04-14 NOTE — Patient Instructions (Signed)
May restart Plavix in 48 hours (7/16)  If pulse rate is still elevated at home, check BP and taking 1/2 of the Metoprolol this afternoon.  Await pathology results.  YOU HAD AN ENDOSCOPIC PROCEDURE TODAY AT [facility]:   Refer to the procedure report that was given to you for any specific questions about what was found during the examination.  If the procedure report does not answer your questions, please call your gastroenterologist to clarify.  If you requested that your care partner not be given the details of your procedure findings, then the procedure report has been included in a sealed envelope for you to review at your convenience later.  YOU SHOULD EXPECT: Some feelings of bloating in the abdomen. Passage of more gas than usual.  Walking can help get rid of the air that was put into your GI tract during the procedure and reduce the bloating. If you had a lower endoscopy (such as a colonoscopy or flexible sigmoidoscopy) you may notice spotting of blood in your stool or on the toilet paper. If you underwent a bowel prep for your procedure, you may not have a normal bowel movement for a few days.  Please Note:  You might notice some irritation and congestion in your nose or some drainage.  This is from the oxygen used during your procedure.  There is no need for concern and it should clear up in a day or so.  SYMPTOMS TO REPORT IMMEDIATELY:  Following lower endoscopy (colonoscopy or flexible sigmoidoscopy):  Excessive amounts of blood in the stool  Significant tenderness or worsening of abdominal pains  Swelling of the abdomen that is new, acute  Fever of 100F or higher  Following upper endoscopy (EGD)  Vomiting of blood or coffee ground material  New chest pain or pain under the shoulder blades  Painful or persistently difficult swallowing  New shortness of breath  Fever of 100F or higher  Black, tarry-looking stools  For urgent or emergent issues, a  gastroenterologist can be reached at any hour by calling [phone]. Do not use MyChart messaging for urgent concerns.    DIET:  Dilation diet, instructions included in discharge packet.  ACTIVITY:  You should plan to take it easy for the rest of today and you should NOT DRIVE or use heavy machinery until tomorrow (because of the sedation medicines used during the test).    FOLLOW UP: Our staff will call the number listed on your records the next business day following your procedure.  We will call around 7:15- 8:00 am to check on you and address any questions or concerns that you may have regarding the information given to you following your procedure. If we do not reach you, we will leave a message.  If you develop any symptoms (ie: fever, flu-like symptoms, shortness of breath, cough etc.) before then, please call [phone].  If you test positive for Covid 19 in the 2 weeks post procedure, please call and report this information to [place].    If any biopsies were taken you will be contacted by phone or by letter within the next 1-3 weeks.  Please call us at [phone] if you have not heard about the biopsies in 3 weeks.    SIGNATURES/CONFIDENTIALITY: You and/or your care partner have signed paperwork which will be entered into your electronic medical record.  These signatures attest to the fact that that the information above on your After Visit Summary has been reviewed and is  understood.  Full responsibility of the confidentiality of this discharge information lies with you and/or your care-partner.

## 2022-04-14 NOTE — Progress Notes (Signed)
Tranfers to PACU with 2 L [place], HR >115.  Pt alert and awake.  Spoke with patient about fast HR denies chest discomfort.  Spoke with patient about alcohol intake with patient stating little less a fifth per day.  Questioned about today and patient admitted to have 3 shots this A. M.  MD made aware.

## 2022-04-17 ENCOUNTER — Telehealth: Payer: Self-pay | Admitting: *Deleted

## 2022-04-17 NOTE — Telephone Encounter (Signed)
  Follow up Call-     04/14/2022    1:41 PM 08/23/2020   10:28 AM 06/17/2020    9:08 AM 10/10/2019    1:57 PM 10/06/2019    1:46 PM  Call back number  Post procedure Call Back phone  # [phone]  Permission to leave phone message Yes Yes Yes Yes Yes     Patient questions:  Do you have a fever, pain , or abdominal swelling? No. Pain Score  0 *  Have you tolerated food without any problems? Yes.  - still having some swallowing issues but he said he was calling the office to make an appointment.   Have you been able to return to your normal activities? Yes.    Do you have any questions about your discharge instructions: Diet   No. Medications  No. Follow up visit  No.  Do you have questions or concerns about your Care? No.  Actions: * If pain score is 4 or above: No action needed, pain <4.

## 2022-04-20 ENCOUNTER — Ambulatory Visit (INDEPENDENT_AMBULATORY_CARE_PROVIDER_SITE_OTHER): Payer: BC Managed Care – PPO | Admitting: Family Medicine

## 2022-04-20 ENCOUNTER — Encounter: Payer: Self-pay | Admitting: Gastroenterology

## 2022-04-20 VITALS — BP 129/96 | HR 91

## 2022-04-20 DIAGNOSIS — Z23 Encounter for immunization: Secondary | ICD-10-CM

## 2022-04-20 NOTE — Progress Notes (Signed)
Medical screening examination/treatment was performed by qualified clinical staff member and as supervising physician I was immediately available for consultation/collaboration. I have reviewed documentation and agree with assessment and plan.  Octavie Westerhold, DO  

## 2022-04-20 NOTE — Progress Notes (Signed)
Patient is here for his second Shingrix vaccine. Verified no previous allergy to immunization. Shingrix injection to right deltoid with no apparent complications. Patient advised to call with any problems.

## 2022-05-01 ENCOUNTER — Telehealth: Payer: BC Managed Care – PPO

## 2022-05-04 ENCOUNTER — Encounter: Payer: BC Managed Care – PPO | Admitting: Gastroenterology

## 2022-05-19 ENCOUNTER — Telehealth: Payer: Self-pay | Admitting: Gastroenterology

## 2022-05-19 NOTE — Telephone Encounter (Signed)
Hi Dr. Fuller Plan,  Patient called requesting a transfer of care over to Dr. Rush Landmark due to preference.   Please advise on scheduling.   Thanks

## 2022-05-20 NOTE — Telephone Encounter (Signed)
OK per patient preference.

## 2022-05-21 ENCOUNTER — Other Ambulatory Visit: Payer: Self-pay | Admitting: Family Medicine

## 2022-05-25 NOTE — Telephone Encounter (Signed)
OK to accept patient transfer interoffice. Thanks. GM

## 2022-05-25 NOTE — Telephone Encounter (Signed)
Hi Dr. Rush Landmark,   Would you accept this transfer of care below?   Thanks

## 2022-05-30 ENCOUNTER — Encounter: Payer: BC Managed Care – PPO | Admitting: Gastroenterology

## 2022-05-30 ENCOUNTER — Encounter: Payer: Self-pay | Admitting: Gastroenterology

## 2022-05-30 ENCOUNTER — Ambulatory Visit (INDEPENDENT_AMBULATORY_CARE_PROVIDER_SITE_OTHER): Payer: BC Managed Care – PPO | Admitting: Gastroenterology

## 2022-05-30 VITALS — BP 122/70 | HR 89 | Ht 70.0 in | Wt 152.0 lb

## 2022-05-30 DIAGNOSIS — Q396 Congenital diverticulum of esophagus: Secondary | ICD-10-CM | POA: Diagnosis not present

## 2022-05-30 DIAGNOSIS — K449 Diaphragmatic hernia without obstruction or gangrene: Secondary | ICD-10-CM

## 2022-05-30 DIAGNOSIS — R198 Other specified symptoms and signs involving the digestive system and abdomen: Secondary | ICD-10-CM

## 2022-05-30 DIAGNOSIS — Z86010 Personal history of colonic polyps: Secondary | ICD-10-CM

## 2022-05-30 DIAGNOSIS — K649 Unspecified hemorrhoids: Secondary | ICD-10-CM

## 2022-05-30 DIAGNOSIS — K222 Esophageal obstruction: Secondary | ICD-10-CM

## 2022-05-30 DIAGNOSIS — R1319 Other dysphagia: Secondary | ICD-10-CM

## 2022-05-30 MED ORDER — HYDROCORTISONE ACETATE 25 MG RE SUPP
RECTAL | 1 refills | Status: DC
Start: 2022-05-30 — End: 2022-07-27

## 2022-05-30 NOTE — Progress Notes (Unsigned)
[facility] VISIT   Primary Care Provider Luetta Nutting, [place] [address] [place] [address] [phone]   Patient Profile: Jeremiah Short is a 61 y.o. male with a pmh significant for CAD, Past Medical History:  Diagnosis Date   Anxiety    Aortic atherosclerosis ([place]) 02/24/2021   Barrett's esophagus    Cardiomyopathy in other diseases classified elsewhere    Closed compression fracture of body of L1 vertebra ([place]) 02/24/2021   Coronary artery disease    Diverticulosis    Emphysema lung ([place]) 02/24/2021   Esophageal stricture    ETOH abuse    GERD (gastroesophageal reflux disease)    Hiatal hernia    History of ETOH abuse    Hypertension    Major depressive disorder    Myocardial infarction [facility]) 2009   Pure hypercholesterolemia    Smoker    Stented coronary artery    Tubular adenoma of colon 2012   Wears glasses     The patient presents to the [facility] for an evaluation and management of problem(s) noted below:  Problem List No diagnosis found.  History of Present Illness    The patient does/does not take NSAIDs or BC/Goody Powder. Patient has/has not had an EGD. Patient has/has not had a Colonoscopy.  GI Review of Systems Positive as above Negative for  Pyrosis; Reflux; Regurgitation; Dysphagia; Odynophagia; Globus; Post-prandial cough; Nocturnal cough; Nasal regurgitation; Epigastric pain; Nausea; Vomiting; Hematemesis; Jaundice; Change in Appetite; Early satiety; Abdominal pain; Abdominal bloating; Eructation; Flatulence; Change in BM Frequency; Change in BM Consistency; Constipation; Diarrhea; Incontinence; Urgency; Tenesmus; Hematochezia; Melena  Review of Systems General: Denies fevers/chills/weight loss/night sweats HEENT: Denies oral lesions/sore throat/headaches/visual changes Cardiovascular: Denies chest pain/palpitations Pulmonary: Denies shortness of  breath/cough Gastroenterological: See HPI Genitourinary: Denies darkened urine or hematuria Hematological: Denies easy bruising/bleeding Endocrine: Denies temperature intolerance Dermatological: Denies skin changes Psychological: Mood is stable Allergy & Immunology: Denies severe allergic reactions Musculoskeletal: Denies new arthralgias   Medications Current Outpatient Medications  Medication Sig Dispense Refill   albuterol (VENTOLIN HFA) 108 (90 Base) MCG/ACT inhaler TAKE 2 PUFFS BY MOUTH EVERY 6 HOURS AS NEEDED FOR WHEEZE OR SHORTNESS OF BREATH 8.5 each 1   aspirin EC 81 MG EC tablet Take 1 tablet (81 mg total) by mouth daily.     atomoxetine (STRATTERA) 40 MG capsule Take '40mg'$  daily x5 days then increase to '40mg'$  twice per day (Patient taking differently: 40 mg 2 (two) times daily with a meal.) 60 capsule 2   atorvastatin (LIPITOR) 40 MG tablet TAKE 1 TABLET BY MOUTH DAILY AT 6 PM. 90 tablet 1   busPIRone (BUSPAR) 15 MG tablet Take 1 tablet (15 mg total) by mouth 2 (two) times daily. (Patient taking differently: Take 15 mg by mouth daily.) 60 tablet 3   clopidogrel (PLAVIX) 75 MG tablet Take 1 tablet (75 mg total) by mouth daily. 90 tablet 3   folic acid (FOLVITE) 1 MG tablet Take 1 tablet (1 mg total) by mouth daily.     isosorbide mononitrate (IMDUR) 60 MG 24 hr tablet Take 1 tablet (60 mg total) by mouth daily. 90 tablet 3   lansoprazole (PREVACID) 30 MG capsule TAKE 1 CAPSULE BY MOUTH DAILY BEFORE BREAKFAST. 90 capsule 2   lansoprazole (PREVACID) 30 MG capsule Take 1 capsule (30 mg total) by mouth 2 (two) times daily before a meal. Increase to twice daily for 1 month 60 capsule 0   metoprolol succinate (  TOPROL-XL) 25 MG 24 hr tablet Take 0.5 tablets (12.5 mg total) by mouth daily. 45 tablet 3   Multiple Vitamin (MULTIVITAMIN) capsule Take 1 capsule by mouth daily.     nitroGLYCERIN (NITROSTAT) 0.4 MG SL tablet PLACE 1 TABLET UNDER THE TONGUE EVERY 5 MINUTES FOR 3 DOSES AS NEEDED FOR  CHEST PAIN 25 tablet 5   thiamine 100 MG tablet Take 1 tablet (100 mg total) by mouth daily.     venlafaxine XR (EFFEXOR-XR) 150 MG 24 hr capsule Take 150 mg by mouth daily.     No current facility-administered medications for this visit.    Allergies Allergies  Allergen Reactions   Morphine And Related Other (See Comments)    dont remember the symptoms. Has tolerated dilaudid and oxycodone    Histories Past Medical History:  Diagnosis Date   Anxiety    Aortic atherosclerosis ([place]) 02/24/2021   Barrett's esophagus    Cardiomyopathy in other diseases classified elsewhere    Closed compression fracture of body of L1 vertebra ([place]) 02/24/2021   Coronary artery disease    Diverticulosis    Emphysema lung ([place]) 02/24/2021   Esophageal stricture    ETOH abuse    GERD (gastroesophageal reflux disease)    Hiatal hernia    History of ETOH abuse    Hypertension    Major depressive disorder    Myocardial infarction [facility]) 2009   Pure hypercholesterolemia    Smoker    Stented coronary artery    Tubular adenoma of colon 2012   Wears glasses    Past Surgical History:  Procedure Laterality Date   APPENDECTOMY  2011   during colectomy   CARDIAC CATHETERIZATION  2009   placed 2 stents   CARDIAC CATHETERIZATION N/A 09/27/2016   Procedure: Left Heart Cath and Coronary Angiography;  Surgeon: [name], MD;  Location: [facility];  Service: Cardiovascular;  Laterality: N/A;   CARDIAC CATHETERIZATION N/A 09/27/2016   Procedure: Coronary Stent Intervention;  Surgeon: [name], MD;  Location: [facility] [facility];  Service: Cardiovascular;  Laterality: N/A;   CHOLECYSTECTOMY N/A 02/27/2021   Procedure: LAPAROSCOPIC CHOLECYSTECTOMY;  Surgeon: Erroll Luna, MD;  Location: [facility];  Service: General;  Laterality: N/A;   CORONARY STENT PLACEMENT     HERNIA REPAIR     INGUINAL HERNIA REPAIR Left 09/16/2013   Procedure: HERNIA REPAIR INGUINAL ADULT;  Surgeon: [name], MD;   Location: [place] [facility];  Service: General;  Laterality: Left;   MOUTH SURGERY     PARTIAL COLECTOMY  2011   sigmoid   TONSILLECTOMY     WISDOM TOOTH EXTRACTION     Social History   Socioeconomic History   Marital status: Divorced    Spouse name: Not on file   Number of children: 2   Years of education: Not on file   Highest education level: Not on file  Occupational History   Occupation: Glass blower/designer  Tobacco Use   Smoking status: Every Day    Packs/day: 0.75    Years: 40.00    Total pack years: 30.00    Types: Cigarettes   Smokeless tobacco: Never   Tobacco comments:    Pt given handout to quit smoking  Vaping Use   Vaping Use: Never used  Substance and Sexual Activity   Alcohol use: Not Currently    Comment: daily   Drug use: No   Sexual activity: Not on file  Other Topics Concern   Not on  file  Social History Narrative   Not on file   Social Determinants of Health   Financial Resource Strain: Not on file  Food Insecurity: Not on file  Transportation Needs: Not on file  Physical Activity: Not on file  Stress: Not on file  Social Connections: Not on file  Intimate Partner Violence: Not on file   Family History  Problem Relation Age of Onset   Lung cancer Mother    Heart disease Father        has pacemaker   Breast cancer Sister    Colon cancer Neg Hx    Colon polyps Neg Hx    Esophageal cancer Neg Hx    Kidney disease Neg Hx    Diabetes Neg Hx    Stomach cancer Neg Hx    Rectal cancer Neg Hx    I have reviewed his medical, social, and family history in detail and updated the electronic medical record as necessary.    PHYSICAL EXAMINATION  BP 122/70   Pulse 89   Ht '5\' 10"'$  (1.778 m)   Wt 152 lb (68.9 kg)   SpO2 97%   BMI 21.81 kg/m  Wt Readings from Last 3 Encounters:  05/30/22 152 lb (68.9 kg)  04/14/22 157 lb (71.2 kg)  03/24/22 157 lb (71.2 kg)   GEN: NAD, appears stated age, doesn't appear chronically ill PSYCH:  Cooperative, without pressured speech EYE: Conjunctivae pink, sclerae anicteric ENT: MMM, without oral ulcers, no erythema or exudates noted NECK: Supple CV: RR without R/Gs  RESP: CTAB posteriorly, without wheezing GI: NABS, soft, NT/ND, without rebound or guarding, no HSM appreciated GU: DRE shows MSK/EXT: _ edema, no palmar erythema SKIN: No jaundice, no spider angiomata, no concerning rashes NEURO:  Alert & Oriented x 3, no focal deficits, no evidence of asterixis   REVIEW OF DATA  I reviewed the following data at the time of this encounter:  GI Procedures and Studies  ***  Laboratory Studies  ***  Imaging Studies  ***   ASSESSMENT  Mr. Holland is a 61 y.o. male with a pmh significant for The patient is seen today for evaluation and management of:  No diagnosis found.  ***   PLAN  There are no diagnoses linked to this encounter.   No orders of the defined types were placed in this encounter.   New Prescriptions   No medications on file   Modified Medications   No medications on file    Planned Follow Up No follow-ups on file.   Total Time in Face-to-Face and in Coordination of Care for patient including independent/personal interpretation/review of prior testing, medical history, examination, medication adjustment, communicating results with the patient directly, and documentation within the EHR is ***.   Justice Britain, MD [facility] Office # [phone]

## 2022-05-30 NOTE — Patient Instructions (Addendum)
_______________________________________________________  If you are age 62 or older, your body mass index should be between 23-30. Your Body mass index is 21.81 kg/m. If this is out of the aforementioned range listed, please consider follow up with your Primary Care Provider.  If you are age 33 or younger, your body mass index should be between 19-25. Your Body mass index is 21.81 kg/m. If this is out of the aformentioned range listed, please consider follow up with your Primary Care Provider.   ________________________________________________________  The [facility] providers would like to encourage you to use [facility] to communicate with providers for non-urgent requests or questions.  Due to long hold times on the telephone, sending your provider a message by [facility] may be a faster and more efficient way to get a response.  Please allow 48 business hours for a response.  Please remember that this is for non-urgent requests.  _______________________________________________________  Toileting tips to help with your constipation - Drink at least 64-80 ounces of water/liquid per day. - Establish a time to try to move your bowels every day.  For many people, this is after a cup of coffee or after a meal such as breakfast. - Sit all of the way back on the toilet keeping your back fairly straight and while sitting up, try to rest the tops of your forearms on your upper thighs.   - Raising your feet with a step stool/squatty potty can be helpful to improve the angle that allows your stool to pass through the rectum. - Relax the rectum feeling it bulge toward the toilet water.  If you feel your rectum raising toward your body, you are contracting rather than relaxing. - Breathe in and slowly exhale. "Belly breath" by expanding your belly towards your belly button. Keep belly expanded as you gently direct pressure down and back to the anus.  A low pitched GRRR sound can assist with increasing  intra-abdominal pressure.  - Repeat 3-4 times. If unsuccessful, contract the pelvic floor to restore normal tone and get off the toilet.  Avoid excessive straining. - To reduce excessive wiping by teaching your anus to normally contract, place hands on outer aspect of knees and resist knee movement outward.  Hold 5-10 second then place hands just inside of knees and resist inward movement of knees.  Hold 5 seconds.  Repeat a few times each way.   We will send your records to [facility]. They will reach out to schedule. [facility], [place] [address]  [phone]  Use the Anusol every night for 1 week, then every other night for 1 week.   Use calmol 4 over the counter, 3 nights in a row, as needed after Anusol course.  Consider using Fibercon fiber supplement daily.  It was a pleasure to see you today!  Thank you for trusting me with your gastrointestinal care!

## 2022-06-02 ENCOUNTER — Encounter: Payer: Self-pay | Admitting: Gastroenterology

## 2022-06-03 DIAGNOSIS — Z86010 Personal history of colonic polyps: Secondary | ICD-10-CM | POA: Insufficient documentation

## 2022-06-03 DIAGNOSIS — R1310 Dysphagia, unspecified: Secondary | ICD-10-CM | POA: Insufficient documentation

## 2022-06-03 DIAGNOSIS — Q396 Congenital diverticulum of esophagus: Secondary | ICD-10-CM | POA: Insufficient documentation

## 2022-06-03 DIAGNOSIS — K449 Diaphragmatic hernia without obstruction or gangrene: Secondary | ICD-10-CM | POA: Insufficient documentation

## 2022-06-03 DIAGNOSIS — R198 Other specified symptoms and signs involving the digestive system and abdomen: Secondary | ICD-10-CM | POA: Insufficient documentation

## 2022-06-03 DIAGNOSIS — R1319 Other dysphagia: Secondary | ICD-10-CM | POA: Insufficient documentation

## 2022-06-03 DIAGNOSIS — K649 Unspecified hemorrhoids: Secondary | ICD-10-CM | POA: Insufficient documentation

## 2022-06-03 DIAGNOSIS — K222 Esophageal obstruction: Secondary | ICD-10-CM | POA: Insufficient documentation

## 2022-06-06 ENCOUNTER — Telehealth: Payer: Self-pay

## 2022-06-06 NOTE — Telephone Encounter (Signed)
Recall has been entered under Dr Mansouraty's name.

## 2022-06-06 NOTE — Telephone Encounter (Signed)
-----   Message from Irving Copas., MD sent at 06/03/2022  5:28 AM EDT ----- Regarding: Recalls in system Jeremiah Short, This patient is transferred over to my care from Dr. Fuller Plan. Please ensure that the recall colonoscopy and endoscopy are under my name. Thanks. GM

## 2022-06-27 ENCOUNTER — Telehealth: Payer: Self-pay | Admitting: *Deleted

## 2022-06-27 ENCOUNTER — Encounter: Payer: Self-pay | Admitting: Surgery

## 2022-06-27 ENCOUNTER — Ambulatory Visit: Payer: Self-pay | Admitting: Surgery

## 2022-06-27 DIAGNOSIS — K648 Other hemorrhoids: Secondary | ICD-10-CM | POA: Diagnosis present

## 2022-06-27 NOTE — Telephone Encounter (Signed)
   Name: Jeremiah Short  DOB: [birth year]  MRN: [id number]  Primary Cardiologist: Candee Furbish, MD  Chart reviewed as part of pre-operative protocol coverage. Because of Jeremiah Short's past medical history and time since last visit, he will require a follow-up in-office or virtual visit in order to better assess preoperative cardiovascular risk.  Pre-op covering staff: - Please schedule appointment and call patient to inform them. If patient already had an upcoming appointment within acceptable timeframe, please add "pre-op clearance" to the appointment notes so provider is aware. - Please contact requesting surgeon's office via preferred method (i.e, phone, fax) to inform them of need for appointment prior to surgery.  May hold Plavix 5 days prior to planned procedure.   Will route to surgical team so they are aware of plan.   Loel Dubonnet, NP  06/27/2022, 5:50 PM

## 2022-06-27 NOTE — Telephone Encounter (Signed)
   Pre-operative Risk Assessment    Patient Name: Jeremiah Short  DOB: 12/28/1960 MRN: [id number]      Request for Surgical Clearance    Procedure:   hemorrhoidectomy  Date of Surgery:  Clearance TBD                                 Surgeon:  Michael Boston, MD Surgeon's Group or Practice Name:  [facility] Phone number:  [phone] Fax number:  [phone] ATTN:  Illene Regulus, CMA   Type of Clearance Requested:   - Medical  - Pharmacy:  Hold Clopidogrel (Plavix) NOT INDICATED HOW LONG   Type of Anesthesia:  General    Additional requests/questions:    Astrid Divine   06/27/2022, 1:17 PM

## 2022-06-28 NOTE — Telephone Encounter (Signed)
I left a message for the patient to call our office to schedule and office visit for pre-op clearance.

## 2022-06-30 NOTE — Telephone Encounter (Signed)
Pt has been scheduled to see Ermalinda Barrios, PA-C, 07/05/22.  Clearance will be addressed at that time.  Will route to requesting surgeon's office to make them aware.

## 2022-06-30 NOTE — Telephone Encounter (Signed)
Pt called back, made an appt for 07/05/22 @ 10:45am with APP [facility]

## 2022-07-03 NOTE — Progress Notes (Unsigned)
Cardiology Office Note:    Date:  07/05/2022   ID:  Jeremiah Short, DOB [birth year], MRN [id number]  PCP:  Luetta Nutting, DO  [facility] Providers Cardiologist:  Candee Furbish, MD     Referring MD: Luetta Nutting, DO   Chief Complaint:  Pre-op Exam and Follow-up     History of Present Illness:   Jeremiah Short is a 61 y.o. male with a h/o CAD s/p DES-LAD in 2009 and 2017, chronic combined systolic and diastolic heart failure, aortic atherosclerosis, hyperlipidemia, emphysema, barrett's esophagus, and tobacco    Pt was last seen in cardiology clinic on 09/01/2021 by Dr. [name]. At that time he was doing well from a cardiac standpoint. He did report intermittent dizziness in the setting of low BP- he couldn't tolerate coreg and losartan.  He was started on Toprol-XL 12.5 mg daily.  Last echo 2018 LVEF 45-50%.  Was cleared by [place] for for endo 04/2022.  She is on my schedule for preop clearance for hemorrhoidectomy by Dr. Michael Boston and will have to hold Plavix. Back in Feb he had chest pain tightness, pressure into his arms that felt like he was having a heart attack and went to Med center in [place]. NTG helped but didn't take it away. Time eased it. Not related to food. Troponin negative x 1 and EKG unchanged. He didn't stay after waiting 5 hrs. Also has a lot of esophageal problems with similar symptoms. Has pain when he eats-food just sits in his esophagus. 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 Walks dogs 1/2 mile day. No chest pain or dyspnea with activity. Smokes 1ppd, can't quit.     Past Medical History:  Diagnosis Date   Anxiety    Aortic atherosclerosis ([place]) 02/24/2021   Barrett's esophagus    Cardiomyopathy in other diseases classified elsewhere    Closed compression fracture of body of L1 vertebra ([place]) 02/24/2021   Coronary artery disease    Diverticulosis    Emphysema lung ([place]) 02/24/2021   Esophageal stricture    ETOH abuse    GERD (gastroesophageal reflux  disease)    Hiatal hernia    History of ETOH abuse    Hypertension    Major depressive disorder    Myocardial infarction ([place]) 2009   Pure hypercholesterolemia    Smoker    Stented coronary artery    Tubular adenoma of colon 2012   Wears glasses    Current Medications: Current Meds  Medication Sig   albuterol (VENTOLIN HFA) 108 (90 Base) MCG/ACT inhaler TAKE 2 PUFFS BY MOUTH EVERY 6 HOURS AS NEEDED FOR WHEEZE OR SHORTNESS OF BREATH   aspirin EC 81 MG EC tablet Take 1 tablet (81 mg total) by mouth daily.   atomoxetine (STRATTERA) 40 MG capsule Take '40mg'$  daily x5 days then increase to '40mg'$  twice per day   atorvastatin (LIPITOR) 40 MG tablet TAKE 1 TABLET BY MOUTH DAILY AT 6 PM.   busPIRone (BUSPAR) 15 MG tablet Take 1 tablet (15 mg total) by mouth 2 (two) times daily.   clopidogrel (PLAVIX) 75 MG tablet Take 1 tablet (75 mg total) by mouth daily.   folic acid (FOLVITE) 1 MG tablet Take 1 tablet (1 mg total) by mouth daily.   hydrocortisone (ANUSOL-HC) 25 MG suppository Use one every night for one week, then use every other night for one week.   isosorbide mononitrate (IMDUR) 60 MG 24 hr tablet Take 1 tablet (60 mg total) by mouth daily.  lansoprazole (PREVACID) 30 MG capsule TAKE 1 CAPSULE BY MOUTH DAILY BEFORE BREAKFAST.   metoprolol succinate (TOPROL-XL) 25 MG 24 hr tablet Take 0.5 tablets (12.5 mg total) by mouth daily.   Multiple Vitamin (MULTIVITAMIN) capsule Take 1 capsule by mouth daily.   nitroGLYCERIN (NITROSTAT) 0.4 MG SL tablet PLACE 1 TABLET UNDER THE TONGUE EVERY 5 MINUTES FOR 3 DOSES AS NEEDED FOR CHEST PAIN   thiamine 100 MG tablet Take 1 tablet (100 mg total) by mouth daily.   venlafaxine XR (EFFEXOR-XR) 150 MG 24 hr capsule Take 150 mg by mouth daily.    Allergies:   Morphine and related   Social History   Tobacco Use   Smoking status: Every Day    Packs/day: 0.75    Years: 40.00    Total pack years: 30.00    Types: Cigarettes   Smokeless tobacco: Never    Tobacco comments:    Pt given handout to quit smoking  Vaping Use   Vaping Use: Never used  Substance Use Topics   Alcohol use: Not Currently    Comment: daily   Drug use: No    Family Hx: The patient's family history includes Breast cancer in his sister; Heart disease in his father; Lung cancer in his mother. There is no history of Colon cancer, Colon polyps, Esophageal cancer, Kidney disease, Diabetes, Stomach cancer, Rectal cancer, Inflammatory bowel disease, Liver disease, or Pancreatic cancer.  ROS   EKGs/Labs/Other Test Reviewed:    EKG:  EKG is   ordered today.  The ekg ordered today demonstrates NSR with old inf MI and antlat MI, no change from prior tracings  Recent Labs: 02/09/2022: ALT 15; BUN 13; Creat 0.77; Hemoglobin 13.9; Platelets 133; Potassium 4.4; Sodium 140; TSH 1.27   Recent Lipid Panel Recent Labs    02/09/22 0000  CHOL 157  TRIG 68  HDL 76  LDLCALC 67     Prior CV Studies:     Echo 12/2016 Study Conclusions   - Left ventricle: The cavity size was normal. Wall thickness was    increased in a pattern of mild LVH. Systolic function was mildly    reduced. The estimated ejection fraction was in the range of 45%    to 50%. No mural thrombus with Definity contrast. Distal    anterior, anteroseptal, apical and inferoapical akinesis    suggestive of infarct. Doppler parameters are consistent with    abnormal left ventricular relaxation (grade 1 diastolic    dysfunction). The E/e&' ratio is <8, suggesting normal LV filling    pressure.  - Mitral valve: Mildly thickened leaflets . There was trivial    regurgitation.  - Left atrium: The atrium was normal in size.  - Systemic veins: The IVC measures >2.1 cm, but collapses more than    50%, suggesting an elevated RA pressure of 8 mmHg.   Impressions:   - Compared to a prior study in 10/2016, the LVEF has improved to    45-50%. There are persistent distal anterior, anteroseptal,    apical and inferoapical  wall motion abnormalities consistent with    LAD territory infarct/scar.   Coronary angiogram 09/27/2016: LVEF 40% with mid to distal anterior apical and inferoapical akinesis. Mild disease RCA and circumflex. Mid LAD occluded just proximal to the previously placed stent, S/P 3.0 x 22 mm resolute onyx DES, postdilated with 3.5 x 12 [place] at 16 atmospheric, 100% reduced to 0%, D2 stent jailed, balloon antroplasty with 2.0 x 12 mm balloon, 99%  reduced to less than 10%, TIMI-3 to TIMI-3 in both vessels.   Recommendation: Patient will need dual antiplatelet therapy for at least one year or longer. Needs compliance reiteration. Smoking cessation already discussed with the patient. Moderation in alcohol use discussed  Risk Assessment/Calculations/Metrics:              Physical Exam:    VS:  BP 122/72   Pulse 88   Ht '5\' 10"'$  (1.778 m)   Wt 152 lb 3.2 oz (69 kg)   SpO2 97%   BMI 21.84 kg/m     Wt Readings from Last 3 Encounters:  07/05/22 152 lb 3.2 oz (69 kg)  05/30/22 152 lb (68.9 kg)  04/14/22 157 lb (71.2 kg)    Physical Exam  GEN: Thin, in no acute distress  Neck: no JVD, carotid bruits, or masses Cardiac:RRR; no murmurs, rubs, or gallops  Respiratory:  decreased breath sounds but clear to auscultation bilaterally, normal work of breathing GI: soft, nontender, nondistended, + BS Ext: without cyanosis, clubbing, or edema, Good distal pulses bilaterally Neuro:  Alert and Oriented x 3,  Psych: euthymic mood, full affect       ASSESSMENT & PLAN:   No problem-specific Assessment & Plan notes found for this encounter.   Preop clearance for hemorrhoidectomy by Dr. Michael Boston and will have to hold plavix. Patient with history of CAD with stents LAD 2009 and 2017. Episode of severe chest pain 11/2021 went to ED and troponin negative x 1. Didn't stay for full w/u. Also has a lot of chest pain with eating and esophageal problems.Will order exercise myoview and echo to further evaluate.   According to the Revised Cardiac Risk Index (RCRI), his Perioperative Risk of Major Cardiac Event is (%): 6.6  His Functional Capacity in METs is: 7.59 according to the [facility] Activity Status Index (DASI).   CAD s/p DES-LAD in 2009 and 2017 significant chest pain in Feb as described above. No exertional chest pain. Exercise myoview. Continue plavix and ASA  chronic combined systolic and diastolic heart failure LVEF 45-50% on echo 2018-no CHF symptoms. Recheck echo  HLD-LDL 67 01/2022  Tobacco abuse-continues to smoke 1ppd. Not willing to quit.      Shared Decision Making/Informed Consent The risks [chest pain, shortness of breath, cardiac arrhythmias, dizziness, blood pressure fluctuations, myocardial infarction, stroke/transient ischemic attack, nausea, vomiting, allergic reaction, radiation exposure, metallic taste sensation and life-threatening complications (estimated to be 1 in 10,000)], benefits (risk stratification, diagnosing coronary artery disease, treatment guidance) and alternatives of a nuclear stress test were discussed in detail with Mr. Ceesay and he agrees to proceed.   Dispo:  Return in about 1 year (around 07/06/2023) for Routine follow up in 1 year with Dr. [name].   Medication Adjustments/Labs and Tests Ordered: Current medicines are reviewed at length with the patient today.  Concerns regarding medicines are outlined above.  Tests Ordered: Orders Placed This Encounter  Procedures   Cardiac Stress Test: Informed Consent Details: Physician/Practitioner Attestation; Transcribe to consent form and obtain patient signature   Myocardial Perfusion Imaging   EKG 12-Lead   ECHOCARDIOGRAM COMPLETE   Medication Changes: No orders of the defined types were placed in this encounter.  Signed, Ermalinda Barrios, PA-C  07/05/2022 11:55 AM    [facility] [address], [place], [place]  [address] Phone: [phone]; Fax: [phone]

## 2022-07-05 ENCOUNTER — Ambulatory Visit: Payer: 59 | Attending: Physician Assistant | Admitting: Physician Assistant

## 2022-07-05 ENCOUNTER — Encounter (HOSPITAL_COMMUNITY): Payer: Self-pay | Admitting: *Deleted

## 2022-07-05 ENCOUNTER — Encounter: Payer: Self-pay | Admitting: Physician Assistant

## 2022-07-05 ENCOUNTER — Telehealth (HOSPITAL_COMMUNITY): Payer: Self-pay | Admitting: *Deleted

## 2022-07-05 DIAGNOSIS — I5042 Chronic combined systolic (congestive) and diastolic (congestive) heart failure: Secondary | ICD-10-CM

## 2022-07-05 DIAGNOSIS — Z01810 Encounter for preprocedural cardiovascular examination: Secondary | ICD-10-CM

## 2022-07-05 DIAGNOSIS — Z720 Tobacco use: Secondary | ICD-10-CM

## 2022-07-05 DIAGNOSIS — I25119 Atherosclerotic heart disease of native coronary artery with unspecified angina pectoris: Secondary | ICD-10-CM

## 2022-07-05 DIAGNOSIS — E7849 Other hyperlipidemia: Secondary | ICD-10-CM

## 2022-07-05 DIAGNOSIS — R079 Chest pain, unspecified: Secondary | ICD-10-CM

## 2022-07-05 NOTE — Telephone Encounter (Signed)
My Chart letter sent outlining instructions for upcoming test on 07/12/22 @ 10:15

## 2022-07-05 NOTE — Patient Instructions (Signed)
Medication Instructions:   Your physician recommends that you continue on your current medications as directed. Please refer to the Current Medication list given to you today.   *If you need a refill on your cardiac medications before your next appointment, please call your pharmacy*   Lab Work:  None ordered.  If you have labs (blood work) drawn today and your tests are completely normal, you will receive your results only by: [place] (if you have MyChart) OR A paper copy in the mail If you have any lab test that is abnormal or we need to change your treatment, we will call you to review the results.   Testing/Procedures:  Your physician has requested that you have an echocardiogram. Echocardiography is a painless test that uses sound waves to create images of your heart. It provides your doctor with information about the size and shape of your heart and how well your heart's chambers and valves are working. This procedure takes approximately one hour. There are no restrictions for this procedure.  You are scheduled for a Myocardial Perfusion Imaging Study on Wednesday, October 11 at 10:15 am.    Please arrive 15 minutes prior to your appointment time for registration and insurance purposes.   The test will take approximately 3 to 4 hours to complete; you may bring reading material. If someone comes with you to your appointment, they will need to remain in the main lobby due to limited space in the testing area.   How to prepare for your Myocardial Perfusion test:   Do not eat or drink 3 hours prior to your test, except you may have water.    Do not consume products containing caffeine (regular or decaffeinated) 12 hours prior to your test (ex: coffee, chocolate, soda, tea)   Do bring a list of your current medications with you. If not listed below, you may take your medications as normal.   Do not take Metoprolol the am prior to the test.   Bring any held medication to  your appointment, as you may be required to take it once the test is complete.   Do wear comfortable clothes (no overalls) and walking shoes. Tennis shoes are preferred. No  open toed shoes. Do not wear cologne, aftershave or lotions (deodorant is allowed).   If these instructions are not followed, you test will have to be rescheduled.   Please report to [address] for your test. If you have questions or concerns about your appointment, please call the Nuclear Lab at [phone].  If you cannot keep your appointment, please provide 24 hour notification to the Nuclear lab to avoid a possible $50 charge to your account.     Follow-Up: At [facility], you and your health needs are our priority.  As part of our continuing mission to provide you with exceptional heart care, we have created designated Provider Care Teams.  These Care Teams include your primary Cardiologist (physician) and Advanced Practice Providers (APPs -  Physician Assistants and Nurse Practitioners) who all work together to provide you with the care you need, when you need it.  We recommend signing up for the patient portal called "MyChart".  Sign up information is provided on this After Visit Summary.  MyChart is used to connect with patients for Virtual Visits (Telemedicine).  Patients are able to view lab/test results, encounter notes, upcoming appointments, etc.  Non-urgent messages can be sent to your provider as well.   To learn  more about what you can do with MyChart, go to [email or web address].    Your next appointment:   1 year(s)  The format for your next appointment:   In Person  Provider:   Candee Furbish, MD     Other Instructions  Your physician wants you to follow-up in: 1 year with Dr. [name]. You will receive a reminder letter in the mail two months in advance. If you don't receive a letter, please call our office to schedule the follow-up appointment.   Important  Information About Sugar

## 2022-07-12 ENCOUNTER — Ambulatory Visit (HOSPITAL_COMMUNITY): Payer: 59 | Attending: Cardiology

## 2022-07-12 DIAGNOSIS — R079 Chest pain, unspecified: Secondary | ICD-10-CM | POA: Insufficient documentation

## 2022-07-12 DIAGNOSIS — Z01810 Encounter for preprocedural cardiovascular examination: Secondary | ICD-10-CM | POA: Diagnosis not present

## 2022-07-12 DIAGNOSIS — I5042 Chronic combined systolic (congestive) and diastolic (congestive) heart failure: Secondary | ICD-10-CM | POA: Insufficient documentation

## 2022-07-12 DIAGNOSIS — I25119 Atherosclerotic heart disease of native coronary artery with unspecified angina pectoris: Secondary | ICD-10-CM | POA: Diagnosis not present

## 2022-07-12 LAB — MYOCARDIAL PERFUSION IMAGING
Estimated workload: 6.4
Exercise duration (min): 4 min
Exercise duration (sec): 30 s
LV dias vol: 151 mL (ref 62–150)
LV sys vol: 107 mL
MPHR: 158 {beats}/min
Nuc Stress EF: 29 %
Peak HR: 144 {beats}/min
Percent HR: 91 %
Rest HR: 75 {beats}/min
Rest Nuclear Isotope Dose: 10.5 mCi
SDS: 0
SRS: 22
SSS: 22
ST Depression (mm): 0 mm
Stress Nuclear Isotope Dose: 31.2 mCi
TID: 0.88

## 2022-07-12 MED ORDER — REGADENOSON 0.4 MG/5ML IV SOLN: 0.4000 mg | Freq: Once | INTRAVENOUS | Status: AC

## 2022-07-12 MED ORDER — TECHNETIUM TC 99M TETROFOSMIN IV KIT
10.6000 | PACK | Freq: Once | INTRAVENOUS | Status: AC | PRN
  Administered 2022-07-12: 10.6 via INTRAVENOUS

## 2022-07-12 MED ORDER — TECHNETIUM TC 99M TETROFOSMIN IV KIT
31.2000 | PACK | Freq: Once | INTRAVENOUS | Status: AC | PRN
  Administered 2022-07-12: 31.2 via INTRAVENOUS

## 2022-07-19 ENCOUNTER — Ambulatory Visit (HOSPITAL_COMMUNITY): Payer: 59 | Attending: Internal Medicine

## 2022-07-19 DIAGNOSIS — I25119 Atherosclerotic heart disease of native coronary artery with unspecified angina pectoris: Secondary | ICD-10-CM | POA: Diagnosis not present

## 2022-07-19 DIAGNOSIS — I5042 Chronic combined systolic (congestive) and diastolic (congestive) heart failure: Secondary | ICD-10-CM | POA: Insufficient documentation

## 2022-07-19 DIAGNOSIS — R079 Chest pain, unspecified: Secondary | ICD-10-CM | POA: Insufficient documentation

## 2022-07-19 DIAGNOSIS — Z01810 Encounter for preprocedural cardiovascular examination: Secondary | ICD-10-CM | POA: Diagnosis present

## 2022-07-19 LAB — ECHOCARDIOGRAM COMPLETE
Area-P 1/2: 4.89 cm2
S' Lateral: 4.2 cm

## 2022-07-19 MED ORDER — PERFLUTREN LIPID MICROSPHERE
1.0000 mL | INTRAVENOUS | Status: AC | PRN
  Administered 2022-07-19: 1 mL via INTRAVENOUS

## 2022-07-21 ENCOUNTER — Encounter: Payer: Self-pay | Admitting: Physician Assistant

## 2022-07-21 ENCOUNTER — Telehealth: Payer: Self-pay

## 2022-07-21 DIAGNOSIS — I77819 Aortic ectasia, unspecified site: Secondary | ICD-10-CM

## 2022-07-21 DIAGNOSIS — I420 Dilated cardiomyopathy: Secondary | ICD-10-CM | POA: Insufficient documentation

## 2022-07-21 NOTE — Telephone Encounter (Signed)
Spoke with patient in detail about results. Appt has been scheduled for 10/25 with [name]. Copy sent to PCP. Repeat ECHO order placed for 1 year under [place].

## 2022-07-21 NOTE — Telephone Encounter (Signed)
-----   Message from Liliane Shi, [place] sent at 07/21/2022 12:50 PM EDT ----- EF is now severely depressed at 25-30% (EF in 2018 was 45-50%). There is mild dilation of the aortic root and ascending aorta. He will need a repeat echocardiogram in 1 year to recheck this. There is mild mitral regurgitation.    Reviewed with Dr. [name]. Because of significant reduction in EF, he needs to undergo cardiac catheterization to evaluate.   PLAN:  -Please arrange follow up with Dr. [name], Ermalinda Barrios, PA or any other APP in the next week to discuss cardiac catheterization  -Send copy to PCP  -Arrange Echocardiogram in 1 year (order under Gerrianne Scale, PA) - Dx: dilated asc aorta and aortic root Richardson Dopp, PA-C    07/21/2022 12:41 PM

## 2022-07-24 ENCOUNTER — Other Ambulatory Visit: Payer: Self-pay | Admitting: Family Medicine

## 2022-07-25 NOTE — Progress Notes (Unsigned)
Office Visit    Patient Name: Jeremiah Short Date of Encounter: 07/25/2022  Primary Care Provider:  Luetta Nutting, DO Primary Cardiologist:  Candee Furbish, MD Primary Electrophysiologist: None  Chief Complaint    Jeremiah Short is a 61 y.o. male with PMH of h/o CAD s/p DES-LAD in 2009 and 2017, chronic combined systolic and diastolic heart failure, aortic atherosclerosis, hyperlipidemia, emphysema, barrett's esophagus, and tobacco abuse who presents today to discuss left heart catheterization for surgical clearance.  Past Medical History    Past Medical History:  Diagnosis Date   Anxiety    Aortic atherosclerosis ([place]) 02/24/2021   Barrett's esophagus    Cardiomyopathy    Echocardiogram 10/23: EF 25-30, normal RVSF, mild MR, mild dilation of aortic root 41 mm, mild dilation of asc aorta 41 mm, RAP 8   Closed compression fracture of body of L1 vertebra ([place]) 02/24/2021   Coronary artery disease    Diverticulosis    Emphysema lung ([place]) 02/24/2021   Esophageal stricture    ETOH abuse    GERD (gastroesophageal reflux disease)    Hiatal hernia    History of ETOH abuse    Hypertension    Major depressive disorder    Myocardial infarction [facility]) 2009   Pure hypercholesterolemia    Smoker    Stented coronary artery    Tubular adenoma of colon 2012   Wears glasses    Past Surgical History:  Procedure Laterality Date   APPENDECTOMY  10/02/2009   during colectomy   CARDIAC CATHETERIZATION  10/03/2007   placed 2 stents   CARDIAC CATHETERIZATION N/A 09/27/2016   Procedure: Left Heart Cath and Coronary Angiography;  Surgeon: [name], MD;  Location: [place] [facility];  Service: Cardiovascular;  Laterality: N/A;   CARDIAC CATHETERIZATION N/A 09/27/2016   Procedure: Coronary Stent Intervention;  Surgeon: [name], MD;  Location: [place] [facility];  Service: Cardiovascular;  Laterality: N/A;   CHOLECYSTECTOMY N/A 02/27/2021   Procedure: LAPAROSCOPIC CHOLECYSTECTOMY;  Surgeon:  Erroll Luna, MD;  [facility]: [facility];  Service: General;  Laterality: N/A;   CORONARY STENT PLACEMENT     INGUINAL HERNIA REPAIR Left 09/16/2013   Procedure: HERNIA REPAIR INGUINAL ADULT;  Surgeon: [name], MD;  Location: [place];  Service: General;  Laterality: Left;   INGUINAL HERNIA REPAIR Left 09/16/2013   Dr [name]   MOUTH SURGERY     SIGMOIDECTOMY  10/28/2009   Dr [name].  Diveetriculitis w prior abscess   TONSILLECTOMY     WISDOM TOOTH EXTRACTION      Allergies  Allergies  Allergen Reactions   Morphine And Related Other (See Comments)    dont remember the symptoms. Has tolerated dilaudid and oxycodone    History of Present Illness    Jeremiah Short  is a 61 year old male with the above mention past medical history who presents today to schedule left heart catheterization due to reduced LV function on recent 2D echo.  Jeremiah Short has been followed by Dr. [name] since 2016 for management of coronary artery disease and hypertension.  He has a history of PCI with LHC performed for STEMI in 01/2008 treated with DES to LAD.  He underwent subsequent LHC in 2017 for anterior MI with mLAD occlusion just proximal to the previously placed LAD stent. He underwent sucssesful PCI with DES. EF was 40% at cath.  He was placed on indefinite DAPT with Plavix and ASA.  He continued to do well from cardiac  standpoint until he presented to the ED on 2/23 with complaint of severe chest pain. EKG was unremarkable, chest x-ray was unremarkable, troponin was negative.  He has not had any recurrent symptoms since.  He was cleared for upper endoscopy in 7/thousand 23.  He presented to the clinic on 07/05/2022 for preop clearance of hemorrhoidectomy.  Patient endorses chest discomfort when eating and underwent exercise Myoview and echo to further evaluate.  Myoview results showed no ischemia and reduced heart function with high risk study.  2D echo was completed as well and showed  depressed LV function with EF of 25-30% changed from 2D echo in 2018 that showed EF of 45-50%.  There was also mild mitral regurgitation noted and mild dilation of aortic root.  Results were reviewed with Dr. [name] who felt patient would benefit from undergoing cardiac catheterization due to significant reduction in EF.   Since last being seen in the office patient reports***.  Patient denies chest pain, palpitations, dyspnea, PND, orthopnea, nausea, vomiting, dizziness, syncope, edema, weight gain, or early satiety.   ***Notes: -To discuss and set up cardiac catheterization Home Medications    Current Outpatient Medications  Medication Sig Dispense Refill   albuterol (VENTOLIN HFA) 108 (90 Base) MCG/ACT inhaler TAKE 2 PUFFS BY MOUTH EVERY 6 HOURS AS NEEDED FOR WHEEZE OR SHORTNESS OF BREATH 8.5 each 1   aspirin EC 81 MG EC tablet Take 1 tablet (81 mg total) by mouth daily.     atomoxetine (STRATTERA) 40 MG capsule Take '40mg'$  daily x5 days then increase to '40mg'$  twice per day 60 capsule 2   atorvastatin (LIPITOR) 40 MG tablet TAKE 1 TABLET BY MOUTH DAILY AT 6 PM. 90 tablet 1   busPIRone (BUSPAR) 15 MG tablet Take 1 tablet (15 mg total) by mouth 2 (two) times daily. 60 tablet 3   clopidogrel (PLAVIX) 75 MG tablet Take 1 tablet (75 mg total) by mouth daily. 90 tablet 3   folic acid (FOLVITE) 1 MG tablet Take 1 tablet (1 mg total) by mouth daily.     hydrocortisone (ANUSOL-HC) 25 MG suppository Use one every night for one week, then use every other night for one week. 12 suppository 1   isosorbide mononitrate (IMDUR) 60 MG 24 hr tablet Take 1 tablet (60 mg total) by mouth daily. 90 tablet 3   lansoprazole (PREVACID) 30 MG capsule TAKE 1 CAPSULE BY MOUTH DAILY BEFORE BREAKFAST. 90 capsule 2   metoprolol succinate (TOPROL-XL) 25 MG 24 hr tablet Take 0.5 tablets (12.5 mg total) by mouth daily. 45 tablet 3   Multiple Vitamin (MULTIVITAMIN) capsule Take 1 capsule by mouth daily.     nitroGLYCERIN  (NITROSTAT) 0.4 MG SL tablet PLACE 1 TABLET UNDER THE TONGUE EVERY 5 MINUTES FOR 3 DOSES AS NEEDED FOR CHEST PAIN 25 tablet 5   thiamine 100 MG tablet Take 1 tablet (100 mg total) by mouth daily.     venlafaxine XR (EFFEXOR-XR) 150 MG 24 hr capsule Take 1 capsule (150 mg total) by mouth daily with breakfast. Take 1 capsule ( '150mg'$  total) by mouth daily with breakfast. Needs appointment for future refills. 30 capsule 0   No current facility-administered medications for this visit.   Facility-Administered Medications Ordered in Other Visits  Medication Dose Route Frequency Provider Last Rate Last Admin   regadenoson (LEXISCAN) injection SOLN 0.4 mg  0.4 mg Intravenous Once Jerline Pain, MD         Review of Systems  Please see the history  of present illness.    (+)*** (+)***  All other systems reviewed and are otherwise negative except as noted above.  Physical Exam    Wt Readings from Last 3 Encounters:  07/05/22 152 lb 3.2 oz (69 kg)  05/30/22 152 lb (68.9 kg)  04/14/22 157 lb (71.2 kg)   ST:MHDQQ were no vitals filed for this visit.,There is no height or weight on file to calculate BMI.  Constitutional:      Appearance: Healthy appearance. Not in distress.  Neck:     Vascular: JVD normal.  Pulmonary:     Effort: Pulmonary effort is normal.     Breath sounds: No wheezing. No rales. Diminished in the bases Cardiovascular:     Normal rate. Regular rhythm. Normal S1. Normal S2.      Murmurs: There is no murmur.  Edema:    Peripheral edema absent.  Abdominal:     Palpations: Abdomen is soft non tender. There is no hepatomegaly.  Skin:    General: Skin is warm and dry.  Neurological:     General: No focal deficit present.     Mental Status: Alert and oriented to person, place and time.     Cranial Nerves: Cranial nerves are intact.  EKG/LABS/Other Studies Reviewed    ECG personally reviewed by me today - ***  Risk Assessment/Calculations:   {Does this patient have  ATRIAL FIBRILLATION?[phone]}        Lab Results  Component Value Date   WBC 5.8 02/09/2022   HGB 13.9 02/09/2022   HCT 40.2 02/09/2022   MCV 101.0 (H) 02/09/2022   PLT 133 (L) 02/09/2022   Lab Results  Component Value Date   CREATININE 0.77 02/09/2022   BUN 13 02/09/2022   NA 140 02/09/2022   K 4.4 02/09/2022   CL 106 02/09/2022   CO2 27 02/09/2022   Lab Results  Component Value Date   ALT 15 02/09/2022   AST 21 02/09/2022   ALKPHOS 47 03/01/2021   BILITOT 0.6 02/09/2022   Lab Results  Component Value Date   CHOL 157 02/09/2022   HDL 76 02/09/2022   LDLCALC 67 02/09/2022   LDLDIRECT 15.0 05/06/2018   TRIG 68 02/09/2022   CHOLHDL 2.1 02/09/2022    Lab Results  Component Value Date   HGBA1C 5.4 02/09/2022    Assessment & Plan    1.  Chronic combined systolic and diastolic CHF: -2D echo completed 10/18 with EF of 25-30% changed from 2D echo in 2018 that showed EF of 45-50%.  -R/LHC was recommended by Dr. [name] -BMET and CBC today with updated EKG -Patient currently on GDMT with Toprol 12.5 mg we will add***   2.  Coronary artery disease: -s/p inferior STEMI with DES-LAD in 2009 and 2017 with diagonal 2 jailed treated with angioplasty and recent Myoview with no ischemia but high risk. -Today patient reports*** -Continue GDMT with Plavix and ASA 81 mg indefinitely, Lipitor 40 mg, and Toprol 12.5 mg daily, Imdur 60 mg daily  3.  Hyperlipidemia: LDL was 67 at goal of less than 70 on 01/2022  4.  Hypertension: -Patient's blood pressure today was*** -Continue Toprol-XL as noted above      Disposition: Follow-up with Candee Furbish, MD or APP in *** months {Are you ordering a CV Procedure (e.g. stress test, cath, DCCV, TEE, etc)?   Press F2        :[id number]}   Medication Adjustments/Labs and Tests Ordered: Current medicines are reviewed at length with the  patient today.  Concerns regarding medicines are outlined above.   Signed, Mable Fill, Marissa Nestle,  NP 07/25/2022, 10:18 AM [facility]  Note:  This document was prepared using Dragon voice recognition software and may include unintentional dictation errors.

## 2022-07-25 NOTE — H&P (View-Only) (Signed)
Office Visit    Patient Name: Jeremiah Short Date of Encounter: 07/25/2022  Primary Care Provider:  Luetta Nutting, DO Primary Cardiologist:  Candee Furbish, MD Primary Electrophysiologist: None  Chief Complaint    Jeremiah Short is a 61 y.o. male with PMH of h/o CAD s/p DES-LAD in 2009 and 2017, chronic combined systolic and diastolic heart failure, aortic atherosclerosis, hyperlipidemia, emphysema, barrett's esophagus, and tobacco abuse who presents today to discuss left heart catheterization for surgical clearance.  Past Medical History    Past Medical History:  Diagnosis Date   Anxiety    Aortic atherosclerosis ([place]) 02/24/2021   Barrett's esophagus    Cardiomyopathy    Echocardiogram 10/23: EF 25-30, normal RVSF, mild MR, mild dilation of aortic root 41 mm, mild dilation of asc aorta 41 mm, RAP 8   Closed compression fracture of body of L1 vertebra ([place]) 02/24/2021   Coronary artery disease    Diverticulosis    Emphysema lung ([place]) 02/24/2021   Esophageal stricture    ETOH abuse    GERD (gastroesophageal reflux disease)    Hiatal hernia    History of ETOH abuse    Hypertension    Major depressive disorder    Myocardial infarction [facility]) 2009   Pure hypercholesterolemia    Smoker    Stented coronary artery    Tubular adenoma of colon 2012   Wears glasses    Past Surgical History:  Procedure Laterality Date   APPENDECTOMY  10/02/2009   during colectomy   CARDIAC CATHETERIZATION  10/03/2007   placed 2 stents   CARDIAC CATHETERIZATION N/A 09/27/2016   Procedure: Left Heart Cath and Coronary Angiography;  Surgeon: [name], MD;  Location: [place] [facility];  Service: Cardiovascular;  Laterality: N/A;   CARDIAC CATHETERIZATION N/A 09/27/2016   Procedure: Coronary Stent Intervention;  Surgeon: [name], MD;  Location: [place] [facility];  Service: Cardiovascular;  Laterality: N/A;   CHOLECYSTECTOMY N/A 02/27/2021   Procedure: LAPAROSCOPIC CHOLECYSTECTOMY;  Surgeon:  Erroll Luna, MD;  [facility];  Service: General;  Laterality: N/A;   CORONARY STENT PLACEMENT     INGUINAL HERNIA REPAIR Left 09/16/2013   Procedure: HERNIA REPAIR INGUINAL ADULT;  Surgeon: [name], MD;  Location: [place];  Service: General;  Laterality: Left;   INGUINAL HERNIA REPAIR Left 09/16/2013   Dr [name]   MOUTH SURGERY     SIGMOIDECTOMY  10/28/2009   Dr [name].  Diveetriculitis w prior abscess   TONSILLECTOMY     WISDOM TOOTH EXTRACTION      Allergies  Allergies  Allergen Reactions   Morphine And Related Other (See Comments)    dont remember the symptoms. Has tolerated dilaudid and oxycodone    History of Present Illness    Jeremiah Short  is a 61 year old male with the above mention past medical history who presents today to schedule left heart catheterization due to reduced LV function on recent 2D echo.  [name] has been followed by Dr. [name] since 2016 for management of coronary artery disease and hypertension.  He has a history of PCI with LHC performed for STEMI in 01/2008 treated with DES to LAD.  He underwent subsequent LHC in 2017 for anterior MI with mLAD occlusion just proximal to the previously placed LAD stent. He underwent sucssesful PCI with DES. EF was 40% at cath.  He was placed on indefinite DAPT with Plavix and ASA.  He continued to do well from cardiac  standpoint until he presented to the ED on 2/23 with complaint of severe chest pain. EKG was unremarkable, chest x-ray was unremarkable, troponin was negative.  He has not had any recurrent symptoms since.  He was cleared for upper endoscopy in 7/thousand 23.  He presented to the clinic on 07/05/2022 for preop clearance of hemorrhoidectomy.  Patient endorses chest discomfort when eating and underwent exercise Myoview and echo to further evaluate.  Myoview results showed no ischemia and reduced heart function with high risk study.  2D echo was completed as well and showed  depressed LV function with EF of 25-30% changed from 2D echo in 2018 that showed EF of 45-50%.  There was also mild mitral regurgitation noted and mild dilation of aortic root.  Results were reviewed with Dr. [name] who felt patient would benefit from undergoing cardiac catheterization due to significant reduction in EF.   Jeremiah Short presents today alone to discuss recent echo results and arrange R/LHC. Since last being seen in the office patient reports is been doing well without any complaints of chest pain or shortness of breath.  He does note some exertional dyspnea with climbing stairs however he feels this may be related to his COPD.  He is compliant with his current medications denies any adverse reactions.  His blood pressure today was well controlled at 106/68 with rate of 91 bpm.  During today's visit we reviewed patient's echo report and discussed the pathophysiology of LV dysfunction and cardiomyopathy.  Patient had all questions answered to his satisfaction.  He was euvolemic on exam today denies any swelling in his upper extremities.  Patient denies chest pain, palpitations, dyspnea, PND, orthopnea, nausea, vomiting, dizziness, syncope, edema, weight gain, or early satiety.   Home Medications    Current Outpatient Medications  Medication Sig Dispense Refill   albuterol (VENTOLIN HFA) 108 (90 Base) MCG/ACT inhaler TAKE 2 PUFFS BY MOUTH EVERY 6 HOURS AS NEEDED FOR WHEEZE OR SHORTNESS OF BREATH 8.5 each 1   aspirin EC 81 MG EC tablet Take 1 tablet (81 mg total) by mouth daily.     atomoxetine (STRATTERA) 40 MG capsule Take '40mg'$  daily x5 days then increase to '40mg'$  twice per day 60 capsule 2   atorvastatin (LIPITOR) 40 MG tablet TAKE 1 TABLET BY MOUTH DAILY AT 6 PM. 90 tablet 1   busPIRone (BUSPAR) 15 MG tablet Take 1 tablet (15 mg total) by mouth 2 (two) times daily. 60 tablet 3   clopidogrel (PLAVIX) 75 MG tablet Take 1 tablet (75 mg total) by mouth daily. 90 tablet 3   folic acid  (FOLVITE) 1 MG tablet Take 1 tablet (1 mg total) by mouth daily.     hydrocortisone (ANUSOL-HC) 25 MG suppository Use one every night for one week, then use every other night for one week. 12 suppository 1   isosorbide mononitrate (IMDUR) 60 MG 24 hr tablet Take 1 tablet (60 mg total) by mouth daily. 90 tablet 3   lansoprazole (PREVACID) 30 MG capsule TAKE 1 CAPSULE BY MOUTH DAILY BEFORE BREAKFAST. 90 capsule 2   metoprolol succinate (TOPROL-XL) 25 MG 24 hr tablet Take 0.5 tablets (12.5 mg total) by mouth daily. 45 tablet 3   Multiple Vitamin (MULTIVITAMIN) capsule Take 1 capsule by mouth daily.     nitroGLYCERIN (NITROSTAT) 0.4 MG SL tablet PLACE 1 TABLET UNDER THE TONGUE EVERY 5 MINUTES FOR 3 DOSES AS NEEDED FOR CHEST PAIN 25 tablet 5   thiamine 100 MG tablet Take 1 tablet (100  mg total) by mouth daily.     venlafaxine XR (EFFEXOR-XR) 150 MG 24 hr capsule Take 1 capsule (150 mg total) by mouth daily with breakfast. Take 1 capsule ( '150mg'$  total) by mouth daily with breakfast. Needs appointment for future refills. 30 capsule 0   No current facility-administered medications for this visit.   Facility-Administered Medications Ordered in Other Visits  Medication Dose Route Frequency Provider Last Rate Last Admin   regadenoson (LEXISCAN) injection SOLN 0.4 mg  0.4 mg Intravenous Once Jerline Pain, MD         Review of Systems  Please see the history of present illness.    (+) Dyspnea with exertion   All other systems reviewed and are otherwise negative except as noted above.  Physical Exam    Wt Readings from Last 3 Encounters:  07/05/22 152 lb 3.2 oz (69 kg)  05/30/22 152 lb (68.9 kg)  04/14/22 157 lb (71.2 kg)   XY:[id number] were no vitals filed for this visit.,There is no height or weight on file to calculate BMI.  Constitutional:      Appearance: Healthy appearance. Not in distress.  Neck:     Vascular: JVD normal.  Pulmonary:     Effort: Pulmonary effort is normal.     Breath  sounds: No wheezing. No rales. Diminished in the bases Cardiovascular:     Normal rate. Regular rhythm. Normal S1. Normal S2.      Murmurs: There is no murmur.  Edema:    Peripheral edema absent.  Abdominal:     Palpations: Abdomen is soft non tender. There is no hepatomegaly.  Skin:    General: Skin is warm and dry.  Neurological:     General: No focal deficit present.     Mental Status: Alert and oriented to person, place and time.     Cranial Nerves: Cranial nerves are intact.  EKG/LABS/Other Studies Reviewed    ECG personally reviewed by me today -none completed today  Lab Results  Component Value Date   WBC 5.8 02/09/2022   HGB 13.9 02/09/2022   HCT 40.2 02/09/2022   MCV 101.0 (H) 02/09/2022   PLT 133 (L) 02/09/2022   Lab Results  Component Value Date   CREATININE 0.77 02/09/2022   BUN 13 02/09/2022   NA 140 02/09/2022   K 4.4 02/09/2022   CL 106 02/09/2022   CO2 27 02/09/2022   Lab Results  Component Value Date   ALT 15 02/09/2022   AST 21 02/09/2022   ALKPHOS 47 03/01/2021   BILITOT 0.6 02/09/2022   Lab Results  Component Value Date   CHOL 157 02/09/2022   HDL 76 02/09/2022   LDLCALC 67 02/09/2022   LDLDIRECT 15.0 05/06/2018   TRIG 68 02/09/2022   CHOLHDL 2.1 02/09/2022    Lab Results  Component Value Date   HGBA1C 5.4 02/09/2022    Assessment & Plan    1.  Chronic combined systolic and diastolic CHF: -2D echo completed 10/18 with EF of 25-30% changed from 2D echo in 2018 that showed EF of 45-50%.  -R/LHC was recommended by Dr. [name] and patient was scheduled to undergo to evaluate LV function -Patient is euvolemic on exam today -BMET and CBC today with updated EKG -Patient currently on GDMT with Toprol 12.5 mg -Pending lab results we will add Entresto and spironolactone Low sodium diet, fluid restriction <2L, and daily weights encouraged. Educated to contact our office for weight gain of 2 lbs overnight or 5 lbs  in one week.    2.  Coronary  artery disease: -s/p inferior STEMI with DES-LAD in 2009 and 2017 with diagonal 2 jailed treated with angioplasty and recent Myoview with no ischemia but high risk. -Today patient reports no chest pain or anginal equivalent -Continue GDMT with Plavix and ASA 81 mg indefinitely, Lipitor 40 mg, and Toprol 12.5 mg daily, Imdur 60 mg daily  3.  Hyperlipidemia: LDL was 67 at goal of less than 70 on 01/2022  4.  Hypertension: -Patient's blood pressure today was well controlled at 106/68 -Continue Toprol-XL as noted above  5.  Fatigue: -Patient does endorse fatigue over the past few months that he felt was related to his age. -He denies any swelling or shortness of breath currently.  Disposition: Follow-up with Candee Furbish, MD or APP in 4 weeks Shared Decision Making/Informed Consent The risks [stroke (1 in 1000), death (1 in 54), kidney failure [usually temporary] (1 in 500), bleeding (1 in 200), allergic reaction [possibly serious] (1 in 200)], benefits (diagnostic support and management of coronary artery disease) and alternatives of a cardiac catheterization were discussed in detail with Mr. Maxon and he is willing to proceed.   Medication Adjustments/Labs and Tests Ordered: Current medicines are reviewed at length with the patient today.  Concerns regarding medicines are outlined above.   Signed, Mable Fill, Marissa Nestle, NP 07/25/2022, 10:18 AM [facility]  Note:  This document was prepared using Dragon voice recognition software and may include unintentional dictation errors.

## 2022-07-26 ENCOUNTER — Ambulatory Visit: Payer: 59 | Attending: Nurse Practitioner | Admitting: Nurse Practitioner

## 2022-07-26 ENCOUNTER — Other Ambulatory Visit: Payer: Self-pay | Admitting: Family Medicine

## 2022-07-26 ENCOUNTER — Encounter: Payer: Self-pay | Admitting: Nurse Practitioner

## 2022-07-26 VITALS — BP 106/68 | HR 91 | Ht 70.0 in | Wt 156.2 lb

## 2022-07-26 DIAGNOSIS — I10 Essential (primary) hypertension: Secondary | ICD-10-CM

## 2022-07-26 DIAGNOSIS — I25119 Atherosclerotic heart disease of native coronary artery with unspecified angina pectoris: Secondary | ICD-10-CM | POA: Diagnosis not present

## 2022-07-26 DIAGNOSIS — E7800 Pure hypercholesterolemia, unspecified: Secondary | ICD-10-CM

## 2022-07-26 DIAGNOSIS — I5042 Chronic combined systolic (congestive) and diastolic (congestive) heart failure: Secondary | ICD-10-CM

## 2022-07-26 DIAGNOSIS — R5383 Other fatigue: Secondary | ICD-10-CM

## 2022-07-26 MED ORDER — SODIUM CHLORIDE 0.9% FLUSH: 3.0000 mL | Freq: Two times a day (BID) | INTRAVENOUS | Status: DC

## 2022-07-26 NOTE — Patient Instructions (Addendum)
Medication Instructions:  Your physician recommends that you continue on your current medications as directed. Please refer to the Current Medication list given to you today.  *If you need a refill on your cardiac medications before your next appointment, please call your pharmacy*   Lab Work: BMET, CBC  If you have labs (blood work) drawn today and your tests are completely normal, you will receive your results only by: [place] (if you have MyChart) OR A paper copy in the mail If you have any lab test that is abnormal or we need to change your treatment, we will call you to review the results.   Testing/Procedures: Your physician has requested that you have a cardiac catheterization. Cardiac catheterization is used to diagnose and/or treat various heart conditions. Doctors may recommend this procedure for a number of different reasons. The most common reason is to evaluate chest pain. Chest pain can be a symptom of coronary artery disease (CAD), and cardiac catheterization can show whether plaque is narrowing or blocking your heart's arteries. This procedure is also used to evaluate the valves, as well as measure the blood flow and oxygen levels in different parts of your heart. For further information please visit [email or web address]. Please follow instruction sheet, as given.    Follow-Up: At [facility], you and your health needs are our priority.  As part of our continuing mission to provide you with exceptional heart care, we have created designated Provider Care Teams.  These Care Teams include your primary Cardiologist (physician) and Advanced Practice Providers (APPs -  Physician Assistants and Nurse Practitioners) who all work together to provide you with the care you need, when you need it.  We recommend signing up for the patient portal called "MyChart".  Sign up information is provided on this After Visit Summary.  MyChart is used to connect with patients for  Virtual Visits (Telemedicine).  Patients are able to view lab/test results, encounter notes, upcoming appointments, etc.  Non-urgent messages can be sent to your provider as well.   To learn more about what you can do with MyChart, go to [email or web address].    Your next appointment:   4 week(s)  The format for your next appointment:   In Person  Provider:   Candee Furbish, MD  or Ambrose Pancoast, NP   Other Instructions       Cardiac/Peripheral Catheterization   You are scheduled for a Cardiac Catheterization on Tuesday, October 31 with Dr. Sherren Mocha.  1. Please arrive at the Main Entrance A at [facility]: [place], [place] [address] on October 31 at 8:30 AM (This time is two hours before your procedure to ensure your preparation). Free valet parking service is available. You will check in at ADMITTING. The support person will be asked to wait in the waiting room.  It is OK to have someone drop you off and come back when you are ready to be discharged.        Special note: Every effort is made to have your procedure done on time. Please understand that emergencies sometimes delay scheduled procedures.   . 2. Diet: Do not eat solid foods after midnight.  You may have clear liquids until 5 AM the day of the procedure.  3. Labs: today  4. Medication instructions in preparation for your procedure:   Contrast Allergy: No   *For reference purposes while preparing patient instructions.   Delete this med list prior to printing instructions for patient.*  On the morning of your procedure, take Plavix/Clopidogrel and any morning medicines NOT listed above.  You may use sips of water.  5. Plan to go home the same day, you will only stay overnight if medically necessary. 6. You MUST have a responsible adult to drive you home. 7. An adult MUST be with you the first 24 hours after you arrive home. 8. Bring a current list of your medications, and the last time  and date medication taken. 9. Bring ID and current insurance cards. 10.Please wear clothes that are easy to get on and off and wear slip-on shoes.  Thank you for allowing [place] to care for you!   -- [facility]

## 2022-07-27 LAB — CBC
Hematocrit: 42.3 % (ref 37.5–51.0)
Hemoglobin: 13.6 g/dL (ref 13.0–17.7)
MCH: 33.4 pg — ABNORMAL HIGH (ref 26.6–33.0)
MCHC: 32.2 g/dL (ref 31.5–35.7)
MCV: 104 fL — ABNORMAL HIGH (ref 79–97)
Platelets: 159 10*3/uL (ref 150–450)
RBC: 4.07 x10E6/uL — ABNORMAL LOW (ref 4.14–5.80)
RDW: 12.4 % (ref 11.6–15.4)
WBC: 4.5 10*3/uL (ref 3.4–10.8)

## 2022-07-27 LAB — BASIC METABOLIC PANEL
BUN/Creatinine Ratio: 14 (ref 10–24)
BUN: 12 mg/dL (ref 8–27)
CO2: 24 mmol/L (ref 20–29)
Calcium: 9.4 mg/dL (ref 8.6–10.2)
Chloride: 103 mmol/L (ref 96–106)
Creatinine, Ser: 0.86 mg/dL (ref 0.76–1.27)
Glucose: 82 mg/dL (ref 70–99)
Potassium: 4.4 mmol/L (ref 3.5–5.2)
Sodium: 140 mmol/L (ref 134–144)
eGFR: 98 mL/min/{1.73_m2} (ref >59)

## 2022-07-31 ENCOUNTER — Telehealth: Payer: Self-pay | Admitting: *Deleted

## 2022-07-31 NOTE — Telephone Encounter (Signed)
Cardiac Catheterization scheduled at [facility] for: Tuesday August 01, 2022 10:30 AM Arrival time and place: [facility] Entrance A at: 8:30 AM  Nothing to eat after midnight prior to procedure, clear liquids until 5 AM day of procedure.  Medication instructions: -Usual morning medications can be taken with sips of water including aspirin 81 mg and Plavix 75 mg.  Confirmed patient has responsible adult to drive home post procedure and be with patient first 24 hours after arriving home.  Patient reports no new symptoms concerning for COVID-19 in the past 10 days.  Reviewed procedure instructions with patient.

## 2022-08-01 ENCOUNTER — Encounter (HOSPITAL_COMMUNITY): Payer: Self-pay | Admitting: Cardiovascular Disease

## 2022-08-01 ENCOUNTER — Ambulatory Visit (HOSPITAL_COMMUNITY)
Admission: RE | Admit: 2022-08-01 | Discharge: 2022-08-01 | Disposition: A | Discharge status: Home / Self Care | Payer: 59 | Attending: Cardiovascular Disease | Admitting: Cardiovascular Disease

## 2022-08-01 ENCOUNTER — Encounter (HOSPITAL_COMMUNITY)
Admission: RE | Disposition: A | Discharge status: Home / Self Care | Payer: Self-pay | Source: Home / Self Care | Attending: Cardiovascular Disease

## 2022-08-01 ENCOUNTER — Other Ambulatory Visit: Payer: Self-pay

## 2022-08-01 DIAGNOSIS — R5383 Other fatigue: Secondary | ICD-10-CM | POA: Insufficient documentation

## 2022-08-01 DIAGNOSIS — I700 Atherosclerosis of aorta: Secondary | ICD-10-CM | POA: Diagnosis not present

## 2022-08-01 DIAGNOSIS — I252 Old myocardial infarction: Secondary | ICD-10-CM | POA: Insufficient documentation

## 2022-08-01 DIAGNOSIS — I255 Ischemic cardiomyopathy: Secondary | ICD-10-CM | POA: Insufficient documentation

## 2022-08-01 DIAGNOSIS — I5042 Chronic combined systolic (congestive) and diastolic (congestive) heart failure: Secondary | ICD-10-CM | POA: Diagnosis not present

## 2022-08-01 DIAGNOSIS — Z7982 Long term (current) use of aspirin: Secondary | ICD-10-CM | POA: Insufficient documentation

## 2022-08-01 DIAGNOSIS — E785 Hyperlipidemia, unspecified: Secondary | ICD-10-CM | POA: Diagnosis not present

## 2022-08-01 DIAGNOSIS — Z7902 Long term (current) use of antithrombotics/antiplatelets: Secondary | ICD-10-CM | POA: Insufficient documentation

## 2022-08-01 DIAGNOSIS — I2510 Atherosclerotic heart disease of native coronary artery without angina pectoris: Secondary | ICD-10-CM

## 2022-08-01 DIAGNOSIS — Z955 Presence of coronary angioplasty implant and graft: Secondary | ICD-10-CM | POA: Insufficient documentation

## 2022-08-01 DIAGNOSIS — Z79899 Other long term (current) drug therapy: Secondary | ICD-10-CM | POA: Diagnosis not present

## 2022-08-01 DIAGNOSIS — I110 Hypertensive heart disease with heart failure: Secondary | ICD-10-CM | POA: Diagnosis not present

## 2022-08-01 HISTORY — PX: RIGHT/LEFT HEART CATH AND CORONARY ANGIOGRAPHY: CATH118266

## 2022-08-01 LAB — POCT I-STAT 7, (LYTES, BLD GAS, ICA,H+H)
Acid-base deficit: 3 mmol/L — ABNORMAL HIGH (ref 0.0–2.0)
Bicarbonate: 22.8 mmol/L (ref 20.0–28.0)
Calcium, Ion: 1.23 mmol/L (ref 1.15–1.40)
HCT: 35 % — ABNORMAL LOW (ref 39.0–52.0)
Hemoglobin: 11.9 g/dL — ABNORMAL LOW (ref 13.0–17.0)
O2 Saturation: 96 %
Potassium: 3.9 mmol/L (ref 3.5–5.1)
Sodium: 141 mmol/L (ref 135–145)
TCO2: 24 mmol/L (ref 22–32)
pCO2 arterial: 41.8 mmHg (ref 32–48)
pH, Arterial: 7.346 — ABNORMAL LOW (ref 7.35–7.45)
pO2, Arterial: 84 mmHg (ref 83–108)

## 2022-08-01 LAB — POCT I-STAT EG7
Acid-base deficit: 2 mmol/L (ref 0.0–2.0)
Bicarbonate: 24 mmol/L (ref 20.0–28.0)
Calcium, Ion: 1.22 mmol/L (ref 1.15–1.40)
HCT: 35 % — ABNORMAL LOW (ref 39.0–52.0)
Hemoglobin: 11.9 g/dL — ABNORMAL LOW (ref 13.0–17.0)
O2 Saturation: 71 %
Potassium: 3.9 mmol/L (ref 3.5–5.1)
Sodium: 142 mmol/L (ref 135–145)
TCO2: 25 mmol/L (ref 22–32)
pCO2, Ven: 44.6 mmHg (ref 44–60)
pH, Ven: 7.338 (ref 7.25–7.43)
pO2, Ven: 40 mmHg (ref 32–45)

## 2022-08-01 SURGERY — RIGHT/LEFT HEART CATH AND CORONARY ANGIOGRAPHY
Anesthesia: LOCAL

## 2022-08-01 MED ORDER — SODIUM CHLORIDE 0.9 % IV SOLN: 250.0000 mL | INTRAVENOUS | Status: DC | PRN

## 2022-08-01 MED ORDER — LIDOCAINE HCL (PF) 1 % IJ SOLN
INTRAMUSCULAR | Status: DC | PRN
  Administered 2022-08-01 (×2): 2 mL via INTRADERMAL

## 2022-08-01 MED ORDER — VERAPAMIL HCL 2.5 MG/ML IV SOLN
INTRAVENOUS | Status: DC | PRN
  Administered 2022-08-01: 10 mL via INTRA_ARTERIAL

## 2022-08-01 MED ORDER — SODIUM CHLORIDE 0.9% FLUSH: 3.0000 mL | Freq: Two times a day (BID) | INTRAVENOUS | Status: DC

## 2022-08-01 MED ORDER — ACETAMINOPHEN 325 MG PO TABS: 650.0000 mg | ORAL_TABLET | ORAL | Status: DC | PRN

## 2022-08-01 MED ORDER — MIDAZOLAM HCL 2 MG/2ML IJ SOLN
INTRAMUSCULAR | Status: AC
  Filled 2022-08-01: qty 2

## 2022-08-01 MED ORDER — SODIUM CHLORIDE 0.9 % WEIGHT BASED INFUSION
3.0000 mL/kg/h | INTRAVENOUS | Status: AC
  Administered 2022-08-01: 3 mL/kg/h via INTRAVENOUS

## 2022-08-01 MED ORDER — SODIUM CHLORIDE 0.9% FLUSH: 3.0000 mL | INTRAVENOUS | Status: DC | PRN

## 2022-08-01 MED ORDER — MIDAZOLAM HCL 2 MG/2ML IJ SOLN
INTRAMUSCULAR | Status: DC | PRN
  Administered 2022-08-01: 1 mg via INTRAVENOUS

## 2022-08-01 MED ORDER — SODIUM CHLORIDE 0.9 % WEIGHT BASED INFUSION: 1.0000 mL/kg/h | INTRAVENOUS | Status: DC

## 2022-08-01 MED ORDER — HEPARIN (PORCINE) IN NACL 1000-0.9 UT/500ML-% IV SOLN
INTRAVENOUS | Status: AC
  Filled 2022-08-01: qty 1000

## 2022-08-01 MED ORDER — IOHEXOL 350 MG/ML SOLN
INTRAVENOUS | Status: DC | PRN
  Administered 2022-08-01: 40 mL

## 2022-08-01 MED ORDER — HEPARIN SODIUM (PORCINE) 1000 UNIT/ML IJ SOLN
INTRAMUSCULAR | Status: DC | PRN
  Administered 2022-08-01: 4000 [IU] via INTRAVENOUS

## 2022-08-01 MED ORDER — LABETALOL HCL 5 MG/ML IV SOLN: 10.0000 mg | INTRAVENOUS | Status: DC | PRN

## 2022-08-01 MED ORDER — ASPIRIN 81 MG PO CHEW: 81.0000 mg | CHEWABLE_TABLET | ORAL | Status: DC

## 2022-08-01 MED ORDER — FENTANYL CITRATE (PF) 100 MCG/2ML IJ SOLN
INTRAMUSCULAR | Status: AC
  Filled 2022-08-01: qty 2

## 2022-08-01 MED ORDER — HYDRALAZINE HCL 20 MG/ML IJ SOLN: 10.0000 mg | INTRAMUSCULAR | Status: DC | PRN

## 2022-08-01 MED ORDER — FENTANYL CITRATE (PF) 100 MCG/2ML IJ SOLN
INTRAMUSCULAR | Status: DC | PRN
  Administered 2022-08-01: 25 ug via INTRAVENOUS

## 2022-08-01 MED ORDER — VERAPAMIL HCL 2.5 MG/ML IV SOLN
INTRAVENOUS | Status: AC
  Filled 2022-08-01: qty 2

## 2022-08-01 MED ORDER — LIDOCAINE HCL (PF) 1 % IJ SOLN
INTRAMUSCULAR | Status: AC
  Filled 2022-08-01: qty 30

## 2022-08-01 MED ORDER — ONDANSETRON HCL 4 MG/2ML IJ SOLN: 4.0000 mg | Freq: Four times a day (QID) | INTRAMUSCULAR | Status: DC | PRN

## 2022-08-01 MED ORDER — HEPARIN (PORCINE) IN NACL 1000-0.9 UT/500ML-% IV SOLN
INTRAVENOUS | Status: DC | PRN
  Administered 2022-08-01 (×2): 500 mL

## 2022-08-01 MED ORDER — HEPARIN SODIUM (PORCINE) 1000 UNIT/ML IJ SOLN
INTRAMUSCULAR | Status: AC
  Filled 2022-08-01: qty 10

## 2022-08-01 SURGICAL SUPPLY — 12 items
BAND CMPR LRG ZPHR (HEMOSTASIS) ×1
BAND ZEPHYR COMPRESS 30 LONG (HEMOSTASIS) IMPLANT
CATH 5FR JL3.5 JR4 ANG PIG MP (CATHETERS) IMPLANT
CATH SWAN GANZ 7F STRAIGHT (CATHETERS) IMPLANT
GLIDESHEATH SLEND SS 6F .021 (SHEATH) IMPLANT
GLIDESHEATH SLENDER 7FR .021G (SHEATH) IMPLANT
GUIDEWIRE INQWIRE 1.5J.035X260 (WIRE) IMPLANT
INQWIRE 1.5J .035X260CM (WIRE) ×1
KIT HEART LEFT (KITS) ×2 IMPLANT
PACK CARDIAC CATHETERIZATION (CUSTOM PROCEDURE TRAY) ×2 IMPLANT
TRANSDUCER W/STOPCOCK (MISCELLANEOUS) ×2 IMPLANT
TUBING CIL FLEX 10 FLL-RA (TUBING) ×2 IMPLANT

## 2022-08-01 NOTE — Progress Notes (Signed)
Patient and friend was given discharge instructions. Both verbalized understanding. 

## 2022-08-01 NOTE — Interval H&P Note (Signed)
History and Physical Interval Note:  08/01/2022 9:06 AM  Jeremiah Short  has presented today for surgery, with the diagnosis of decreased lv function.  The various methods of treatment have been discussed with the patient and family. After consideration of risks, benefits and other options for treatment, the patient has consented to  Procedure(s): RIGHT/LEFT HEART CATH AND CORONARY ANGIOGRAPHY (N/A) as a surgical intervention.  The patient's history has been reviewed, patient examined, no change in status, stable for surgery.  I have reviewed the patient's chart and labs.  Questions were answered to the patient's satisfaction.     Sherren Mocha

## 2022-08-01 NOTE — Discharge Instructions (Signed)

## 2022-08-07 ENCOUNTER — Other Ambulatory Visit: Payer: Self-pay | Admitting: Family Medicine

## 2022-08-15 ENCOUNTER — Ambulatory Visit (INDEPENDENT_AMBULATORY_CARE_PROVIDER_SITE_OTHER): Payer: 59 | Admitting: Family Medicine

## 2022-08-15 ENCOUNTER — Encounter: Payer: Self-pay | Admitting: Family Medicine

## 2022-08-15 VITALS — BP 112/92 | HR 81 | Ht 70.0 in | Wt 157.0 lb

## 2022-08-15 DIAGNOSIS — I420 Dilated cardiomyopathy: Secondary | ICD-10-CM | POA: Diagnosis not present

## 2022-08-15 DIAGNOSIS — Z23 Encounter for immunization: Secondary | ICD-10-CM | POA: Diagnosis not present

## 2022-08-15 DIAGNOSIS — F3341 Major depressive disorder, recurrent, in partial remission: Secondary | ICD-10-CM

## 2022-08-15 DIAGNOSIS — F1090 Alcohol use, unspecified, uncomplicated: Secondary | ICD-10-CM | POA: Diagnosis not present

## 2022-08-15 DIAGNOSIS — R5383 Other fatigue: Secondary | ICD-10-CM

## 2022-08-15 DIAGNOSIS — F17210 Nicotine dependence, cigarettes, uncomplicated: Secondary | ICD-10-CM

## 2022-08-15 DIAGNOSIS — J439 Emphysema, unspecified: Secondary | ICD-10-CM

## 2022-08-15 DIAGNOSIS — K2270 Barrett's esophagus without dysplasia: Secondary | ICD-10-CM

## 2022-08-15 NOTE — Assessment & Plan Note (Signed)
I think his acute dyspnea is related to his CHF.  We will plan to get updated pulmonary function testing.  Counseled on smoking cessation

## 2022-08-15 NOTE — Assessment & Plan Note (Signed)
Reduced ejection fraction noted on recent echocardiogram.  Cardiac catheterization without any new changes or significant blockages at this time.  We did discuss that his current lifestyle is likely contributing to his worsening symptoms.  We discussed need to reduce alcohol intake as this can contribute or worsen heart failure.  Additionally counseled on smoking cessation.

## 2022-08-15 NOTE — Assessment & Plan Note (Signed)
We discussed that he is drinking excessive amount of alcohol each week.  We reviewed that this can worsen his heart condition as well as worsened his mood and affect his sleep.  He does not feel like he is ready to cut back or quit at this time.

## 2022-08-15 NOTE — Assessment & Plan Note (Signed)
Currently on lansoprazole.  Has upcoming esophageal manometry.

## 2022-08-15 NOTE — Progress Notes (Signed)
Jeremiah Short - 61 y.o. male MRN [id number]  Date of birth: 07/22/1961  Subjective Chief Complaint  Patient presents with   Medication Refill    HPI Jeremiah Short is a 61 year old male here today for follow-up visit.  Since his last visit he did have another cardiac catheterization due to chronic combined heart failure as his EF had decreased to 25 to 30%.  Catheterization did not show any significant changes or new blockages of the coronary arteries.  He has not had any additional follow-up with cardiology since having this completed.  He continues to have some dyspnea and fatigue.  He is taking Imdur and Toprol daily.  He denies chest pain.  He has not had any palpitations.  He does continue to smoke heavily as well as consume a significant amount of alcohol.  Reports that he is currently drinking about a half a gallon as well as 1/5 of vodka each week.  He feels like he needs this to help with sleep.  He does not want to discuss medications to help with sleep.    He does still feel like his depressive symptoms have improved with Effexor.  He would like to continue this at current strength.  He has had some increased reflux symptoms and had an upcoming esophageal manometry.  He is taking lansoprazole.  ROS:  A comprehensive ROS was completed and negative except as noted per HPI  Allergies  Allergen Reactions   Morphine And Related Other (See Comments)    dont remember the symptoms. Has tolerated dilaudid and oxycodone    Past Medical History:  Diagnosis Date   Anxiety    Aortic atherosclerosis ([place]) 02/24/2021   Barrett's esophagus    Cardiomyopathy    Echocardiogram 10/23: EF 25-30, normal RVSF, mild MR, mild dilation of aortic root 41 mm, mild dilation of asc aorta 41 mm, RAP 8   Closed compression fracture of body of L1 vertebra ([place]) 02/24/2021   Coronary artery disease    Diverticulosis    Emphysema lung ([place]) 02/24/2021   Esophageal stricture    ETOH abuse    GERD  (gastroesophageal reflux disease)    Hiatal hernia    History of ETOH abuse    Hypertension    Major depressive disorder    Myocardial infarction [facility]) 2009   Pure hypercholesterolemia    Smoker    Stented coronary artery    Tubular adenoma of colon 2012   Wears glasses     Past Surgical History:  Procedure Laterality Date   APPENDECTOMY  10/02/2009   during colectomy   CARDIAC CATHETERIZATION  10/03/2007   placed 2 stents   CARDIAC CATHETERIZATION N/A 09/27/2016   Procedure: Left Heart Cath and Coronary Angiography;  Surgeon: [name], MD;  Location: [facility];  Service: Cardiovascular;  Laterality: N/A;   CARDIAC CATHETERIZATION N/A 09/27/2016   Procedure: Coronary Stent Intervention;  Surgeon: [name], MD;  Location: [facility];  Service: Cardiovascular;  Laterality: N/A;   CHOLECYSTECTOMY N/A 02/27/2021   Procedure: LAPAROSCOPIC CHOLECYSTECTOMY;  Surgeon: Erroll Luna, MD;  Location: [facility];  Service: General;  Laterality: N/A;   CORONARY STENT PLACEMENT     INGUINAL HERNIA REPAIR Left 09/16/2013   Procedure: HERNIA REPAIR INGUINAL ADULT;  Surgeon: [name], MD;  Location: [facility];  Service: General;  Laterality: Left;   INGUINAL HERNIA REPAIR Left 09/16/2013   Dr [name]   MOUTH SURGERY     RIGHT/LEFT HEART CATH AND CORONARY  ANGIOGRAPHY N/A 08/01/2022   Procedure: RIGHT/LEFT HEART CATH AND CORONARY ANGIOGRAPHY;  Surgeon: Sherren Mocha, MD;  Location: [facility];  Service: Cardiovascular;  Laterality: N/A;   SIGMOIDECTOMY  10/28/2009   Dr [name].  Diveetriculitis w prior abscess   TONSILLECTOMY     WISDOM TOOTH EXTRACTION      Social History   Socioeconomic History   Marital status: Divorced    Spouse name: Not on file   Number of children: 2   Years of education: Not on file   Highest education level: Not on file  Occupational History   Occupation: Glass blower/designer  Tobacco Use   Smoking status:  Every Day    Packs/day: 0.75    Years: 40.00    Total pack years: 30.00    Types: Cigarettes   Smokeless tobacco: Never   Tobacco comments:    Pt given handout to quit smoking  Vaping Use   Vaping Use: Never used  Substance and Sexual Activity   Alcohol use: Not Currently    Comment: daily   Drug use: No   Sexual activity: Not on file  Other Topics Concern   Not on file  Social History Narrative   Not on file   Social Determinants of Health   Financial Resource Strain: Not on file  Food Insecurity: Not on file  Transportation Needs: Not on file  Physical Activity: Not on file  Stress: Not on file  Social Connections: Not on file    Family History  Problem Relation Age of Onset   Lung cancer Mother    Heart disease Father        has pacemaker   Breast cancer Sister    Colon cancer Neg Hx    Colon polyps Neg Hx    Esophageal cancer Neg Hx    Kidney disease Neg Hx    Diabetes Neg Hx    Stomach cancer Neg Hx    Rectal cancer Neg Hx    Inflammatory bowel disease Neg Hx    Liver disease Neg Hx    Pancreatic cancer Neg Hx     Health Maintenance  Topic Date Due   COVID-19 Vaccine (4 - Pfizer risk series) 08/31/2022 (Originally 12/03/2020)   Lung Cancer Screening  03/03/2023   TETANUS/TDAP  10/23/2028   COLONOSCOPY (Pts 45-51yr Insurance coverage will need to be confirmed)  04/14/2029   INFLUENZA VACCINE  Completed   Hepatitis C Screening  Completed   Zoster Vaccines- Shingrix  Completed   HPV VACCINES  Aged Out   HIV Screening  Discontinued     ----------------------------------------------------------------------------------------------------------------------------------------------------------------------------------------------------------------- Physical Exam BP (!) 112/92 (BP Location: Left Arm, Patient Position: Sitting, Cuff Size: Normal)   Pulse 81   Ht '5\' 10"'$  (1.778 m)   Wt 157 lb (71.2 kg)   SpO2 99%   BMI 22.53 kg/m   Physical  Exam Constitutional:      Appearance: Normal appearance.  HENT:     Head: Normocephalic and atraumatic.  Eyes:     General: No scleral icterus. Cardiovascular:     Rate and Rhythm: Normal rate and regular rhythm.  Pulmonary:     Effort: Pulmonary effort is normal.     Breath sounds: Normal breath sounds.  Musculoskeletal:     Cervical back: Neck supple.  Neurological:     General: No focal deficit present.     Mental Status: He is alert.  Psychiatric:        Mood and Affect: Mood normal.  Behavior: Behavior normal.     ------------------------------------------------------------------------------------------------------------------------------------------------------------------------------------------------------------------- Assessment and Plan  Cardiomyopathy Reduced ejection fraction noted on recent echocardiogram.  Cardiac catheterization without any new changes or significant blockages at this time.  We did discuss that his current lifestyle is likely contributing to his worsening symptoms.  We discussed need to reduce alcohol intake as this can contribute or worsen heart failure.  Additionally counseled on smoking cessation.  Emphysema lung ([place]) I think his acute dyspnea is related to his CHF.  We will plan to get updated pulmonary function testing.  Counseled on smoking cessation  Barrett's esophagus without dysplasia Currently on lansoprazole.  Has upcoming esophageal manometry.  Alcohol use disorder We discussed that he is drinking excessive amount of alcohol each week.  We reviewed that this can worsen his heart condition as well as worsened his mood and affect his sleep.  He does not feel like he is ready to cut back or quit at this time.  MDD (major depressive disorder) He has tried multiple medications for management of his depression.  Not sure if this will get significantly better unless he is able to cut back on his alcohol use.  He has had best results  with Effexor and we will continue this at current strength.  Smoking greater than 20 pack years Counseled on smoking cessation.  He is not ready to quit at this time.   No orders of the defined types were placed in this encounter.   Return in about 6 months (around 02/13/2023) for HTN.    This visit occurred during the SARS-CoV-2 public health emergency.  Safety protocols were in place, including screening questions prior to the visit, additional usage of staff PPE, and extensive cleaning of exam room while observing appropriate contact time as indicated for disinfecting solutions.

## 2022-08-15 NOTE — Patient Instructions (Signed)
Try to cut back on your alcohol intake-This can make you feel more tired/fatigued and worsen your stomach/esophagus issues.  Work on reduction in smoking.

## 2022-08-15 NOTE — Assessment & Plan Note (Signed)
He has tried multiple medications for management of his depression.  Not sure if this will get significantly better unless he is able to cut back on his alcohol use.  He has had best results with Effexor and we will continue this at current strength.

## 2022-08-15 NOTE — Assessment & Plan Note (Signed)
Counseled on smoking cessation.  He is not ready to quit at this time.

## 2022-08-19 LAB — COMPLETE METABOLIC PANEL WITH GFR
AG Ratio: 1.8 (calc) (ref 1.0–2.5)
ALT: 21 U/L (ref 9–46)
AST: 29 U/L (ref 10–35)
Albumin: 4 g/dL (ref 3.6–5.1)
Alkaline phosphatase (APISO): 64 U/L (ref 35–144)
BUN: 20 mg/dL (ref 7–25)
CO2: 30 mmol/L (ref 20–32)
Calcium: 9.1 mg/dL (ref 8.6–10.3)
Chloride: 102 mmol/L (ref 98–110)
Creat: 0.79 mg/dL (ref 0.70–1.35)
Globulin: 2.2 g/dL (calc) (ref 1.9–3.7)
Glucose, Bld: 82 mg/dL (ref 65–99)
Potassium: 4.2 mmol/L (ref 3.5–5.3)
Sodium: 141 mmol/L (ref 135–146)
Total Bilirubin: 0.4 mg/dL (ref 0.2–1.2)
Total Protein: 6.2 g/dL (ref 6.1–8.1)
eGFR: 100 mL/min/{1.73_m2} (ref >=60)

## 2022-08-19 LAB — B12 AND FOLATE PANEL
Folate: >24 ng/mL
Vitamin B-12: 352 pg/mL (ref 200–1100)

## 2022-08-19 LAB — VITAMIN B1: Vitamin B1 (Thiamine): 38 nmol/L — ABNORMAL HIGH (ref 8–30)

## 2022-08-19 LAB — VITAMIN D 25 HYDROXY (VIT D DEFICIENCY, FRACTURES): Vit D, 25-Hydroxy: 46 ng/mL (ref 30–100)

## 2022-08-20 NOTE — Progress Notes (Unsigned)
Office Visit    Patient Name: Jeremiah Short Date of Encounter: 08/22/2022  Primary Care Provider:  Luetta Nutting, DO Primary Cardiologist:  Candee Furbish, MD Primary Electrophysiologist: None  Chief Complaint    Jeremiah Short is a 61 y.o. male with PMH of h/o CAD s/p DES-LAD in 2009 and 2017, chronic combined systolic and diastolic heart failure, aortic atherosclerosis, hyperlipidemia, emphysema, barrett's esophagus, and tobacco abuse who presents today for 4-week follow-up.  Past Medical History    Past Medical History:  Diagnosis Date   Anxiety    Aortic atherosclerosis ([place]) 02/24/2021   Barrett's esophagus    Cardiomyopathy    Echocardiogram 10/23: EF 25-30, normal RVSF, mild MR, mild dilation of aortic root 41 mm, mild dilation of asc aorta 41 mm, RAP 8   Closed compression fracture of body of L1 vertebra ([place]) 02/24/2021   Coronary artery disease    Diverticulosis    Emphysema lung ([place]) 02/24/2021   Esophageal stricture    ETOH abuse    GERD (gastroesophageal reflux disease)    Hiatal hernia    History of ETOH abuse    Hypertension    Major depressive disorder    Myocardial infarction [facility]) 2009   Pure hypercholesterolemia    Smoker    Stented coronary artery    Tubular adenoma of colon 2012   Wears glasses    Past Surgical History:  Procedure Laterality Date   APPENDECTOMY  10/02/2009   during colectomy   CARDIAC CATHETERIZATION  10/03/2007   placed 2 stents   CARDIAC CATHETERIZATION N/A 09/27/2016   Procedure: Left Heart Cath and Coronary Angiography;  Surgeon: [name], MD;  Location: [facility];  Service: Cardiovascular;  Laterality: N/A;   CARDIAC CATHETERIZATION N/A 09/27/2016   Procedure: Coronary Stent Intervention;  Surgeon: [name], MD;  Location: [facility];  Service: Cardiovascular;  Laterality: N/A;   CHOLECYSTECTOMY N/A 02/27/2021   Procedure: LAPAROSCOPIC CHOLECYSTECTOMY;  Surgeon: Erroll Luna, MD;  Location: [facility];   Service: General;  Laterality: N/A;   CORONARY STENT PLACEMENT     INGUINAL HERNIA REPAIR Left 09/16/2013   Procedure: HERNIA REPAIR INGUINAL ADULT;  Surgeon: [name], MD;  Location: [facility];  Service: General;  Laterality: Left;   INGUINAL HERNIA REPAIR Left 09/16/2013   Dr [name]   MOUTH SURGERY     RIGHT/LEFT HEART CATH AND CORONARY ANGIOGRAPHY N/A 08/01/2022   Procedure: RIGHT/LEFT HEART CATH AND CORONARY ANGIOGRAPHY;  Surgeon: Sherren Mocha, MD;  Location: [place];  Service: Cardiovascular;  Laterality: N/A;   SIGMOIDECTOMY  10/28/2009   Dr [name].  Diveetriculitis w prior abscess   TONSILLECTOMY     WISDOM TOOTH EXTRACTION      Allergies  Allergies  Allergen Reactions   Morphine And Related Other (See Comments)    dont remember the symptoms. Has tolerated dilaudid and oxycodone    History of Present Illness    Jeremiah Short  is a 61 year old male with the above mention past medical history who presents today to schedule left heart catheterization due to reduced LV function on recent 2D echo.  Jeremiah Short has been followed by Dr. [name] since 2016 for management of coronary artery disease and hypertension.  He has a history of PCI with LHC performed for STEMI in 01/2008 treated with DES to LAD.  He underwent subsequent LHC in 2017 for anterior MI with mLAD occlusion just proximal to the previously placed LAD  stent. He underwent sucssesful PCI with DES. EF was 40% at cath.  He was placed on indefinite DAPT with Plavix and ASA.  He continued to do well from cardiac standpoint until he presented to the ED on 2/23 with complaint of severe chest pain. EKG was unremarkable, chest x-ray was unremarkable, troponin was negative.  He has not had any recurrent symptoms since.  He was cleared for upper endoscopy in 04/2022.  He presented to the clinic on 07/05/2022 for preop clearance of hemorrhoidectomy.  Patient endorses chest discomfort when eating and  underwent exercise Myoview and echo to further evaluate.  Myoview results showed no ischemia and reduced heart function with high risk study.  2D echo was completed as well and showed depressed LV function with EF of 25-30% changed from 2D echo in 2018 that showed EF of 45-50%.  There was also mild mitral regurgitation noted and mild dilation of aortic root.  Results were reviewed with Dr. [name] who felt patient would benefit from undergoing cardiac catheterization due to significant reduction in EF.  He was seen 07/26/2022 for surgical clearance and R/left heart catheterization due to decreased EF on recent 2D echo.  LHC completed showing widely patent left main with patent LAD stent with mild in-stent restenosis but no high-grade obstruction.  Right heart pressures were low to normal and medical therapy indicated.  Jeremiah Short presents today alone for follow-up of his left heart catheterization.  Since last being seen in the office patient reports that he is not experienced any cardiac complaints.  He is euvolemic on exam today and blood pressures are elevated at 132/98 and were 130/92 on recheck.  His heart rate was 90 bpm and he reports feeling anxious today because of this visit.  He does have a known history of substance abuse with EtOH and is currently working on reducing his consumption and lieu of his reduced heart function.  I advised him to follow-up with his PCP regarding management of alcohol withdrawal and a benzodiazepine program.  During today's visit we reviewed his catheterization report and also discussed secondary prevention and GDMT for managing CHF.  During our visit his sister was on the phone and was able to ask questions regarding his treatment plan.  Patient denies chest pain, palpitations, dyspnea, PND, orthopnea, nausea, vomiting, dizziness, syncope, edema, weight gain, or early satiety.   Home Medications    Current Outpatient Medications  Medication Sig Dispense Refill    albuterol (VENTOLIN HFA) 108 (90 Base) MCG/ACT inhaler TAKE 2 PUFFS BY MOUTH EVERY 6 HOURS AS NEEDED FOR WHEEZE OR SHORTNESS OF BREATH 8.5 each 1   aspirin EC 81 MG EC tablet Take 1 tablet (81 mg total) by mouth daily.     atorvastatin (LIPITOR) 40 MG tablet TAKE 1 TABLET BY MOUTH DAILY AT 6 PM. 90 tablet 1   clopidogrel (PLAVIX) 75 MG tablet Take 1 tablet (75 mg total) by mouth daily. 90 tablet 3   folic acid (FOLVITE) 1 MG tablet Take 1 tablet (1 mg total) by mouth daily.     isosorbide mononitrate (IMDUR) 60 MG 24 hr tablet Take 1 tablet (60 mg total) by mouth daily. 90 tablet 3   lansoprazole (PREVACID) 30 MG capsule TAKE 1 CAPSULE BY MOUTH DAILY BEFORE BREAKFAST. 90 capsule 2   metoprolol succinate (TOPROL-XL) 25 MG 24 hr tablet Take 0.5 tablets (12.5 mg total) by mouth daily. 45 tablet 3   Multiple Vitamin (MULTIVITAMIN) capsule Take 1 capsule by mouth daily.  nitroGLYCERIN (NITROSTAT) 0.4 MG SL tablet PLACE 1 TABLET UNDER THE TONGUE EVERY 5 MINUTES FOR 3 DOSES AS NEEDED FOR CHEST PAIN 25 tablet 5   sacubitril-valsartan (ENTRESTO) 24-26 MG Take 1 tablet by mouth 2 (two) times daily. 60 tablet 1   spironolactone (ALDACTONE) 25 MG tablet Take 0.5 tablets (12.5 mg total) by mouth daily. 30 tablet 3   thiamine 100 MG tablet Take 1 tablet (100 mg total) by mouth daily.     venlafaxine XR (EFFEXOR-XR) 150 MG 24 hr capsule Take 1 capsule (150 mg total) by mouth daily with breakfast. Take 1 capsule ( '150mg'$  total) by mouth daily with breakfast. Needs appointment for future refills. 30 capsule 0   Current Facility-Administered Medications  Medication Dose Route Frequency Provider Last Rate Last Admin   sodium chloride flush (NS) 0.9 % injection 3 mL  3 mL Intravenous Q12H Marylu Lund., NP       Facility-Administered Medications Ordered in Other Visits  Medication Dose Route Frequency Provider Last Rate Last Admin   regadenoson (LEXISCAN) injection SOLN 0.4 mg  0.4 mg Intravenous Once  Jerline Pain, MD         Review of Systems  Please see the history of present illness.    (+) Anxiety (+) Chronic shortness of breath  All other systems reviewed and are otherwise negative except as noted above.  Physical Exam    Wt Readings from Last 3 Encounters:  08/22/22 155 lb (70.3 kg)  08/15/22 157 lb (71.2 kg)  08/01/22 155 lb (70.3 kg)   VS: Vitals:   08/22/22 0909  BP: (!) 132/98  Pulse: 90  SpO2: 93%  ,Body mass index is 21.93 kg/m.  Constitutional:      Appearance: Healthy appearance.  Anxious Neck:     Vascular: JVD normal.  Pulmonary:     Effort: Pulmonary effort is normal.     Breath sounds: No wheezing. No rales. Diminished in the bases Cardiovascular:     Normal rate. Regular rhythm. Normal S1. Normal S2.      Murmurs: There is no murmur.  Edema:    Peripheral edema absent.  Abdominal:     Palpations: Abdomen is soft non tender. There is no hepatomegaly.  Skin:    General: Skin is warm and dry.  Neurological:     General: No focal deficit present.     Mental Status: Alert and oriented to person, place and time.     Cranial Nerves: Cranial nerves are intact.  EKG/LABS/Other Studies Reviewed    ECG personally reviewed by me today -none completed today        Lab Results  Component Value Date   WBC 4.5 07/26/2022   HGB 11.9 (L) 08/01/2022   HGB 11.9 (L) 08/01/2022   HCT 35.0 (L) 08/01/2022   HCT 35.0 (L) 08/01/2022   MCV 104 (H) 07/26/2022   PLT 159 07/26/2022   Lab Results  Component Value Date   CREATININE 0.79 08/15/2022   BUN 20 08/15/2022   NA 141 08/15/2022   K 4.2 08/15/2022   CL 102 08/15/2022   CO2 30 08/15/2022   Lab Results  Component Value Date   ALT 21 08/15/2022   AST 29 08/15/2022   ALKPHOS 47 03/01/2021   BILITOT 0.4 08/15/2022   Lab Results  Component Value Date   CHOL 157 02/09/2022   HDL 76 02/09/2022   LDLCALC 67 02/09/2022   LDLDIRECT 15.0 05/06/2018   TRIG 68 02/09/2022  CHOLHDL 2.1 02/09/2022     Lab Results  Component Value Date   HGBA1C 5.4 02/09/2022    Assessment & Plan    1.  Chronic combined systolic and diastolic CHF: -2D echo completed 10/18 with EF of 25-30% changed from 2D echo in 2018 that showed EF of 45-50%.  -R/LHC was completed and revealed normal coronaries with reduced EF of 25-30% -Patient is euvolemic on exam today -Patient currently on GDMT with Toprol 12.5 mg -Today we will add Entresto 24/26 mg twice daily and spironolactone 12.5 mg daily -We will check BMET in 2 weeks -We will refer to Pharm.D. for management and titration of of heart failure medication Low sodium diet, fluid restriction <2L, and daily weights encouraged. Educated to contact our office for weight gain of 2 lbs overnight or 5 lbs in one week.    2.  Coronary artery disease: -s/p inferior STEMI with DES-LAD in 2009 and 2017 with diagonal 2 jailed treated with angioplasty and recent Myoview with no ischemia but high risk. -Today patient reports no chest pain or anginal equivalent and recent [facility] with widely patent left main and mild in-stent restenosis in mid LAD. -Continue GDMT with Plavix and ASA 81 mg indefinitely, Lipitor 40 mg, and Toprol 12.5 mg daily, Imdur 60 mg daily   3.  Hyperlipidemia: LDL was 67 at goal of less than 70 on 01/2022 -Continue atorvastatin 40 mg daily   4.  Hypertension: -Patient's blood pressure today was elevated at 132/98 -Continue Toprol, spironolactone and Entresto   5.  History of EtOH abuse: -Patient counseled about adverse reactions with alcohol and new heart failure medications.  He was advised to moderate his alcohol consumption and follow-up with his PCP regarding next steps in managing withdrawal with benzodiazepine assistance.  6.  Preop clearance:  -The patient affirms he has been doing well without any new cardiac symptoms. They are able to achieve 7 METS without cardiac limitations. Therefore, based on ACC/AHA guidelines, the patient would  be at acceptable risk for the planned procedure without further cardiovascular testing. The patient was advised that if he develops new symptoms prior to surgery to contact our office to arrange for a follow-up visit, and he verbalized understanding.   Jeremiah Short perioperative risk of a major cardiac event is 6.6% according to the Revised Cardiac Risk Index (RCRI).  Therefore, he is at low risk for perioperative complications.   His functional capacity is fair at 7.31 METs according to the [place] Activity Status Index (DASI). Recommendations: According to ACC/AHA guidelines, no further cardiovascular testing needed.  The patient may proceed to surgery at acceptable risk.   Antiplatelet and/or Anticoagulation Recommendations: Aspirin can be held for 5 days prior to his surgery.  Please resume Aspirin post operatively when it is felt to be safe from a bleeding standpoint.  Clopidogrel (Plavix) can be held for 5 days prior to his surgery and resumed as soon as possible post op.  Disposition: Follow-up with Candee Furbish, MD or APP in 2 months    Medication Adjustments/Labs and Tests Ordered: Current medicines are reviewed at length with the patient today.  Concerns regarding medicines are outlined above.   Signed, Mable Fill, Marissa Nestle, NP 08/22/2022, 10:48 AM [facility]  Note:  This document was prepared using Dragon voice recognition software and may include unintentional dictation errors.

## 2022-08-22 ENCOUNTER — Ambulatory Visit: Payer: 59 | Attending: Nurse Practitioner | Admitting: Nurse Practitioner

## 2022-08-22 ENCOUNTER — Encounter: Payer: Self-pay | Admitting: Nurse Practitioner

## 2022-08-22 VITALS — BP 132/98 | HR 90 | Ht 70.5 in | Wt 155.0 lb

## 2022-08-22 DIAGNOSIS — I5042 Chronic combined systolic (congestive) and diastolic (congestive) heart failure: Secondary | ICD-10-CM

## 2022-08-22 DIAGNOSIS — Z01810 Encounter for preprocedural cardiovascular examination: Secondary | ICD-10-CM

## 2022-08-22 DIAGNOSIS — I25119 Atherosclerotic heart disease of native coronary artery with unspecified angina pectoris: Secondary | ICD-10-CM

## 2022-08-22 DIAGNOSIS — I420 Dilated cardiomyopathy: Secondary | ICD-10-CM

## 2022-08-22 DIAGNOSIS — I10 Essential (primary) hypertension: Secondary | ICD-10-CM | POA: Diagnosis not present

## 2022-08-22 DIAGNOSIS — E7800 Pure hypercholesterolemia, unspecified: Secondary | ICD-10-CM

## 2022-08-22 DIAGNOSIS — F1090 Alcohol use, unspecified, uncomplicated: Secondary | ICD-10-CM

## 2022-08-22 MED ORDER — ENTRESTO 24-26 MG PO TABS: 1.0000 | ORAL_TABLET | Freq: Two times a day (BID) | ORAL | 1 refills | Status: DC

## 2022-08-22 MED ORDER — SPIRONOLACTONE 25 MG PO TABS: 12.5000 mg | ORAL_TABLET | Freq: Every day | ORAL | 3 refills | Status: DC

## 2022-08-22 NOTE — Patient Instructions (Addendum)
Medication Instructions:  START Spironolactone 12.'5mg'$  Tablet comes in '25mg'$  you will need to break the tablet in half and take half tablet daily  START Entresto 24/'26mg'$  Take 1 tablet twice a day (make sure to take the 30day free coupon with you to the pharmacy when you pick up prescription *If you need a refill on your cardiac medications before your next appointment, please call your pharmacy*   Lab Work: [place] BMET If you have labs (blood work) drawn today and your tests are completely normal, you will receive your results only by: [place] (if you have MyChart) OR A paper copy in the mail If you have any lab test that is abnormal or we need to change your treatment, we will call you to review the results.   Testing/Procedures: NONE ORDERED   Follow-Up: At [facility], you and your health needs are our priority.  As part of our continuing mission to provide you with exceptional heart care, we have created designated Provider Care Teams.  These Care Teams include your primary Cardiologist (physician) and Advanced Practice Providers (APPs -  Physician Assistants and Nurse Practitioners) who all work together to provide you with the care you need, when you need it.  We recommend signing up for the patient portal called "MyChart".  Sign up information is provided on this After Visit Summary.  MyChart is used to connect with patients for Virtual Visits (Telemedicine).  Patients are able to view lab/test results, encounter notes, upcoming appointments, etc.  Non-urgent messages can be sent to your provider as well.   To learn more about what you can do with MyChart, go to [email or web address].    Your next appointment:   2 month(s)  The format for your next appointment:   In Person  Provider:   Ambrose Pancoast, NP      Your physician recommends that you schedule a follow-up appointment in: 2-3 WEEKS PHARMD (MED TITRATION) Other Instructions CHECK YOUR WEIGHT DAILY AND  CONTACT THE OFFICE IF YOU GAIN MORE THAN 2LBS IN A DAY OR 5 LBS IN A WEEK   Important Information About Sugar

## 2022-08-29 ENCOUNTER — Telehealth: Payer: Self-pay | Admitting: Pharmacist

## 2022-08-29 NOTE — Telephone Encounter (Signed)
Left message to move PharmD appt from 12/7 to 12/6 based on scheduling. Likely can check BMET on same day (currently scheduled for 12/4).

## 2022-08-31 ENCOUNTER — Ambulatory Visit: Payer: 59

## 2022-08-31 NOTE — Telephone Encounter (Signed)
Appt moved, pt prefers to keep lab on 12/4 so results are available for his appt on 12/6.

## 2022-09-01 ENCOUNTER — Ambulatory Visit: Payer: 59

## 2022-09-04 ENCOUNTER — Ambulatory Visit: Payer: 59 | Attending: Nurse Practitioner

## 2022-09-04 DIAGNOSIS — I5042 Chronic combined systolic (congestive) and diastolic (congestive) heart failure: Secondary | ICD-10-CM

## 2022-09-05 ENCOUNTER — Ambulatory Visit (INDEPENDENT_AMBULATORY_CARE_PROVIDER_SITE_OTHER): Payer: 59 | Admitting: Family Medicine

## 2022-09-05 VITALS — BP 119/86 | HR 103 | Ht 70.0 in | Wt 153.0 lb

## 2022-09-05 DIAGNOSIS — R0600 Dyspnea, unspecified: Secondary | ICD-10-CM

## 2022-09-05 LAB — BASIC METABOLIC PANEL
BUN/Creatinine Ratio: 23 (ref 10–24)
BUN: 19 mg/dL (ref 8–27)
CO2: 21 mmol/L (ref 20–29)
Calcium: 9.5 mg/dL (ref 8.6–10.2)
Chloride: 99 mmol/L (ref 96–106)
Creatinine, Ser: 0.84 mg/dL (ref 0.76–1.27)
Glucose: 95 mg/dL (ref 70–99)
Potassium: 5.1 mmol/L (ref 3.5–5.2)
Sodium: 140 mmol/L (ref 134–144)
eGFR: 99 mL/min/{1.73_m2} (ref >59)

## 2022-09-05 NOTE — Assessment & Plan Note (Signed)
Likely related to CHF as well as COPD.  Spirometry completed today.  Mild obstructive pattern.  Continue albuterol as needed.  Continue management of CHF per cardiology.  We discussed importance of smoking cessation as well as avoidance of alcohol.  Given resources for alcohol detox.

## 2022-09-05 NOTE — Patient Instructions (Addendum)
Detox:  [facility]:  [phone].  [facility]: [phone].

## 2022-09-05 NOTE — Progress Notes (Signed)
Jeremiah Short - 61 y.o. male MRN [id number]  Date of birth: September 21, 1961  Subjective Chief Complaint  Patient presents with   Emphysema    Spirometry    HPI [name] is here today for follow-up of dyspnea.  Spirometry completed today.  Additionally, he was started on Entresto by cardiology for CHF.  He does report improved symptoms.  Continues to use albuterol as needed.  ROS:  A comprehensive ROS was completed and negative except as noted per HPI  Allergies  Allergen Reactions   Morphine And Related Other (See Comments)    dont remember the symptoms. Has tolerated dilaudid and oxycodone    Past Medical History:  Diagnosis Date   Anxiety    Aortic atherosclerosis ([place]) 02/24/2021   Barrett's esophagus    Cardiomyopathy    Echocardiogram 10/23: EF 25-30, normal RVSF, mild MR, mild dilation of aortic root 41 mm, mild dilation of asc aorta 41 mm, RAP 8   Closed compression fracture of body of L1 vertebra ([place]) 02/24/2021   Coronary artery disease    Diverticulosis    Emphysema lung ([place]) 02/24/2021   Esophageal stricture    ETOH abuse    GERD (gastroesophageal reflux disease)    Hiatal hernia    History of ETOH abuse    Hypertension    Major depressive disorder    Myocardial infarction [facility]) 2009   Pure hypercholesterolemia    Smoker    Stented coronary artery    Tubular adenoma of colon 2012   Wears glasses     Past Surgical History:  Procedure Laterality Date   APPENDECTOMY  10/02/2009   during colectomy   CARDIAC CATHETERIZATION  10/03/2007   placed 2 stents   CARDIAC CATHETERIZATION N/A 09/27/2016   Procedure: Left Heart Cath and Coronary Angiography;  Surgeon: [name], MD;  Location: [facility];  Service: Cardiovascular;  Laterality: N/A;   CARDIAC CATHETERIZATION N/A 09/27/2016   Procedure: Coronary Stent Intervention;  Surgeon: [name], MD;  Location: [place];  Service: Cardiovascular;  Laterality: N/A;   CHOLECYSTECTOMY N/A 02/27/2021    Procedure: LAPAROSCOPIC CHOLECYSTECTOMY;  Surgeon: Erroll Luna, MD;  [facility];  Service: General;  Laterality: N/A;   CORONARY STENT PLACEMENT     INGUINAL HERNIA REPAIR Left 09/16/2013   Procedure: HERNIA REPAIR INGUINAL ADULT;  Surgeon: [name], MD;  Location: [facility];  Service: General;  Laterality: Left;   INGUINAL HERNIA REPAIR Left 09/16/2013   Dr [name]   MOUTH SURGERY     RIGHT/LEFT HEART CATH AND CORONARY ANGIOGRAPHY N/A 08/01/2022   Procedure: RIGHT/LEFT HEART CATH AND CORONARY ANGIOGRAPHY;  Surgeon: Sherren Mocha, MD;  Location: [facility] [facility];  Service: Cardiovascular;  Laterality: N/A;   SIGMOIDECTOMY  10/28/2009   Dr [name].  Diveetriculitis w prior abscess   TONSILLECTOMY     WISDOM TOOTH EXTRACTION      Social History   Socioeconomic History   Marital status: Divorced    Spouse name: Not on file   Number of children: 2   Years of education: Not on file   Highest education level: Not on file  Occupational History   Occupation: Glass blower/designer  Tobacco Use   Smoking status: Every Day    Packs/day: 0.75    Years: 40.00    Total pack years: 30.00    Types: Cigarettes   Smokeless tobacco: Never   Tobacco comments:    Pt given handout to quit smoking  Vaping  Use   Vaping Use: Never used  Substance and Sexual Activity   Alcohol use: Not Currently    Comment: daily   Drug use: No   Sexual activity: Not on file  Other Topics Concern   Not on file  Social History Narrative   Not on file   Social Determinants of Health   Financial Resource Strain: Not on file  Food Insecurity: Not on file  Transportation Needs: Not on file  Physical Activity: Not on file  Stress: Not on file  Social Connections: Not on file    Family History  Problem Relation Age of Onset   Lung cancer Mother    Heart disease Father        has pacemaker   Breast cancer Sister    Colon cancer Neg Hx    Colon polyps Neg Hx     Esophageal cancer Neg Hx    Kidney disease Neg Hx    Diabetes Neg Hx    Stomach cancer Neg Hx    Rectal cancer Neg Hx    Inflammatory bowel disease Neg Hx    Liver disease Neg Hx    Pancreatic cancer Neg Hx     Health Maintenance  Topic Date Due   COVID-19 Vaccine (4 - 2023-24 season) 06/02/2022   Lung Cancer Screening  03/03/2023   DTaP/Tdap/Td (3 - Td or Tdap) 10/23/2028   COLONOSCOPY (Pts 45-62yr Insurance coverage will need to be confirmed)  04/14/2029   INFLUENZA VACCINE  Completed   Hepatitis C Screening  Completed   Zoster Vaccines- Shingrix  Completed   HPV VACCINES  Aged Out   HIV Screening  Discontinued     ----------------------------------------------------------------------------------------------------------------------------------------------------------------------------------------------------------------- Physical Exam BP 119/86   Pulse (!) 103   Ht '5\' 10"'$  (1.778 m)   Wt 153 lb (69.4 kg)   SpO2 95%   BMI 21.95 kg/m   Physical Exam Constitutional:      Appearance: Normal appearance.  Neurological:     Mental Status: He is alert.     ------------------------------------------------------------------------------------------------------------------------------------------------------------------------------------------------------------------- Assessment and Plan  Dyspnea Likely related to CHF as well as COPD.  Spirometry completed today.  Mild obstructive pattern.  Continue albuterol as needed.  Continue management of CHF per cardiology.  We discussed importance of smoking cessation as well as avoidance of alcohol.  Given resources for alcohol detox.   No orders of the defined types were placed in this encounter.   No follow-ups on file.    This visit occurred during the SARS-CoV-2 public health emergency.  Safety protocols were in place, including screening questions prior to the visit, additional usage of staff PPE, and extensive cleaning of  exam room while observing appropriate contact time as indicated for disinfecting solutions.

## 2022-09-06 ENCOUNTER — Ambulatory Visit: Payer: 59 | Attending: Interventional Cardiology | Admitting: Pharmacist

## 2022-09-06 VITALS — BP 99/71 | HR 107 | Wt 152.0 lb

## 2022-09-06 DIAGNOSIS — I5042 Chronic combined systolic (congestive) and diastolic (congestive) heart failure: Secondary | ICD-10-CM

## 2022-09-06 MED ORDER — METOPROLOL SUCCINATE ER 25 MG PO TB24: ORAL_TABLET | ORAL | 3 refills | Status: DC

## 2022-09-06 NOTE — Patient Instructions (Addendum)
Summary of today's discussion  1.Please start weighting yourself every morning. Write the weights down. Please call us if you gain 3 or more pounds in a day or 5 or more pounds in a week  2. Set an alarm to help you remember to take your medications  3.Please start taking metoprolol '25mg'$  daily for 1 week then increase to '25mg'$  in the AM and 12.'5mg'$  in the PM  4.Continue spironolactone 12.'5mg'$  daily, Entresto 24/'26mg'$  twice a day, metoprolol 12.'5mg'$  daily, isosorbide '60mg'$  daily  5.Please check your blood pressure once a day. Please bring your readings and cuff with you to your next appointment.   Your blood pressure goal is <130/80  To check your pressure at home you will need to:  1. Sit up in a chair, with feet flat on the floor and back supported. Do not cross your ankles or legs. 2. Rest your left arm so that the cuff is about heart level. If the cuff goes on your upper arm,  then just relax the arm on the table, arm of the chair or your lap. If you have a wrist cuff, we  suggest relaxing your wrist against your chest (think of it as Pledging the Flag with the  wrong arm).  3. Place the cuff snugly around your arm, about 1 inch above the crook of your elbow. The  cords should be inside the groove of your elbow.  4. Sit quietly, with the cuff in place, for about 5 minutes. After that 5 minutes press the power  button to start a reading. 5. Do not talk or move while the reading is taking place.  6. Record your readings on a sheet of paper. Although most cuffs have a memory, it is often  easier to see a pattern developing when the numbers are all in front of you.  7. You can repeat the reading after 1-3 minutes if it is recommended  Make sure your bladder is empty and you have not had caffeine or tobacco within the last 30 min  Always bring your blood pressure log with you to your appointments. If you have not brought your monitor in to be double checked for accuracy, please bring it to your  next appointment.  You can find a list of validated (accurate) blood pressure cuffs at [employer]   Important lifestyle changes to control high blood pressure  Intervention  Effect on the BP  Lose extra pounds and watch your waistline Weight loss is one of the most effective lifestyle changes for controlling blood pressure. If you're overweight or obese, losing even a small amount of weight can help reduce blood pressure. Blood pressure might go down by about 1 millimeter of mercury (mm Hg) with each kilogram (about 2.2 pounds) of weight lost.  Exercise regularly As a general goal, aim for at least 30 minutes of moderate physical activity every day. Regular physical activity can lower high blood pressure by about 5 to 8 mm Hg.  Eat a healthy diet Eating a diet rich in whole grains, fruits, vegetables, and low-fat dairy products and low in saturated fat and cholesterol. A healthy diet can lower high blood pressure by up to 11 mm Hg.  Reduce salt (sodium) in your diet Even a small reduction of sodium in the diet can improve heart health and reduce high blood pressure by about 5 to 6 mm Hg.  Limit alcohol One drink equals 12 ounces of beer, 5 ounces of wine, or 1.5 ounces of 80-proof  liquor.  Limiting alcohol to less than one drink a day for women or two drinks a day for men can help lower blood pressure by about 4 mm Hg.   Please call me at [phone] with any questions.

## 2022-09-06 NOTE — Assessment & Plan Note (Signed)
Assessment: Blood pressure on the low side today in clinic Was 118/82 yesterday at MD Patient reports much higher readings at home, but unsure technique, validity of cuff Patient drinks a large amount of alcohol. We discussed its effects on the heart and I encouraged him to stop or cut back HR is elevated Medication compliance is ok, but could be improved Euvolemic on exam, does not weight himself at home. Was asked to start and record weights K was 5.1 on latest labs, scr stable  Plan: Increase metoprolol succinate to 37.'5mg'$  daily. Will increase from 12.'5mg'$  to '25mg'$  daily for 1 week then increase to a total of 37.'5mg'$  daily. Patient would like to split it into 1 tab in the AM and 0.5 tablet at night For now, continue Entresto 24/'26mg'$  BID and spironolactone 12.'5mg'$  daily. I asked him to check his BP once a day and record the readings. Please call me with any dizziness Start weighing daily. Write the weights down. Please call us if you gain 3 or more pounds in a day or 5 or more pounds in a week Watch sodium intake- max '2000mg'$  per day Decrease or stop drinking alcohol. Patient not really interested in quitting drinking or smoking. We did discuss the negative health impacts and its more than likely cause or contributor of his HF Follow up in clinic in 2 weeks

## 2022-09-06 NOTE — Progress Notes (Signed)
Patient ID: Jeremiah Short                 DOB: 02/21/1961                      MRN: [id number]     HPI: Jeremiah Short is a 61 y.o. male patient of Dr. [name] referred by Dr. Ambrose Pancoast, NP to pharmacy clinic for HF medication management. PMH is significant for CAD s/p DES-LAD in 2009 and 2017, chronic combined systolic and diastolic heart failure, aortic atherosclerosis, hyperlipidemia, emphysema, barrett's esophagus, and tobacco abuse . Most recent LVEF 25-30% on 07/19/22. He underwent a L/R Heart cath due to new depressed EF which showed normal/low right heart pressures, widely patent left main and patient LAD stents with mild in-stent restenosis of the mid vessel. At his f/u visit with [name], BP was elevated along with HR. Patient reported having a history of substance abuse with EtOH and is currently working on reducing his consumption and lieu of his reduced heart function. Spironolactone 12.'5mg'$  daily and Entresto 24/'26mg'$  twice a day were added. Repeat labs showed a stable scr of 0.84, K 5.1.   Discussion with patient today included the following: cardiac medication indications, introduction to [facility], reasoning behind medication titration, importance of medication adherence, and patient engagement. Symptomatically, he is feeling fine, denies dizziness, lightheadedness. Some fatigue. Denies chest pain or palpitations. Had one episode of SOB when he was lying down. He used his inhaler and thinks it helped. Able to complete all ADLs. Activity level good. He does not checks his weight at home. No LEE, PND, or orthopnea. Appetite has been good. He does not adheres to a low-salt diet. Eats canned soup, frozen meals and fast food. Admits he is still drinking everyday. He drinks vokda. Drinks 1/2 gallon per week- that comes out to be about 12 shots per night. Patient states 6-8 drinks per night.   States he almost always takes AM medications, forgets PM medications about 30% of the time.  Current CHF  meds: Spironolactone 12.'5mg'$  daily, Entresto 24/'26mg'$  twice a day, metoprolol 12.'5mg'$  daily, isosorbide '60mg'$  daily Previously tried:  Adherence Assessment  Do you ever forget to take your medication? '[x]'$ Yes '[]'$ No  Do you ever skip doses due to side effects? '[]'$ Yes '[x]'$ No  Do you have trouble affording your medicines? '[]'$ Yes '[]'$ No  Are you ever unable to pick up your medication due to transportation difficulties? '[]'$ Yes '[]'$ No  Do you ever stop taking your medications because you don't believe they are helping? '[]'$ Yes '[]'$ No  Do you check your weight daily? '[]'$ Yes '[x]'$ No   Adherence strategy: Advised to associate taking medications with an activity he does every day. Set an alarm  Barriers to obtaining medications: none  BP goal: <130/80  Family History:  Family History  Problem Relation Age of Onset   Lung cancer Mother    Heart disease Father        has pacemaker   Breast cancer Sister    Colon cancer Neg Hx    Colon polyps Neg Hx    Esophageal cancer Neg Hx    Kidney disease Neg Hx    Diabetes Neg Hx    Stomach cancer Neg Hx    Rectal cancer Neg Hx    Inflammatory bowel disease Neg Hx    Liver disease Neg Hx    Pancreatic cancer Neg Hx    Social History: vodka 1/2 gallon per week, 1 pack per day, no illicit drugs, coughs  to much when he smokes pot  Diet: doesn't usually add to food Canned soup Eats a lot fast food  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 Doesn't check it often   Wt Readings from Last 3 Encounters:  09/06/22 152 lb (68.9 kg)  09/05/22 153 lb (69.4 kg)  08/22/22 155 lb (70.3 kg)   BP Readings from Last 3 Encounters:  09/06/22 99/71  09/05/22 119/86  08/22/22 (!) 132/98   Pulse Readings from Last 3 Encounters:  09/06/22 (!) 107  09/05/22 (!) 103  08/22/22 90    Renal function: Estimated Creatinine Clearance: 88.9 mL/min (by C-G formula based on SCr of 0.84  mg/dL).  Past Medical History:  Diagnosis Date   Anxiety    Aortic atherosclerosis ([place]) 02/24/2021   Barrett's esophagus    Cardiomyopathy    Echocardiogram 10/23: EF 25-30, normal RVSF, mild MR, mild dilation of aortic root 41 mm, mild dilation of asc aorta 41 mm, RAP 8   Closed compression fracture of body of L1 vertebra ([place]) 02/24/2021   Coronary artery disease    Diverticulosis    Emphysema lung ([place]) 02/24/2021   Esophageal stricture    ETOH abuse    GERD (gastroesophageal reflux disease)    Hiatal hernia    History of ETOH abuse    Hypertension    Major depressive disorder    Myocardial infarction [facility]) 2009   Pure hypercholesterolemia    Smoker    Stented coronary artery    Tubular adenoma of colon 2012   Wears glasses     Current Outpatient Medications on File Prior to Visit  Medication Sig Dispense Refill   albuterol (VENTOLIN HFA) 108 (90 Base) MCG/ACT inhaler TAKE 2 PUFFS BY MOUTH EVERY 6 HOURS AS NEEDED FOR WHEEZE OR SHORTNESS OF BREATH 8.5 each 1   aspirin EC 81 MG EC tablet Take 1 tablet (81 mg total) by mouth daily.     atorvastatin (LIPITOR) 40 MG tablet TAKE 1 TABLET BY MOUTH DAILY AT 6 PM. 90 tablet 1   clopidogrel (PLAVIX) 75 MG tablet Take 1 tablet (75 mg total) by mouth daily. 90 tablet 3   folic acid (FOLVITE) 1 MG tablet Take 1 tablet (1 mg total) by mouth daily.     isosorbide mononitrate (IMDUR) 60 MG 24 hr tablet Take 1 tablet (60 mg total) by mouth daily. 90 tablet 3   lansoprazole (PREVACID) 30 MG capsule TAKE 1 CAPSULE BY MOUTH DAILY BEFORE BREAKFAST. 90 capsule 2   Methylcobalamin (B-12) 5000 MCG TBDP Take 0.5 tablets by mouth daily.     Multiple Vitamin (MULTIVITAMIN) capsule Take 1 capsule by mouth daily.     nitroGLYCERIN (NITROSTAT) 0.4 MG SL tablet PLACE 1 TABLET UNDER THE TONGUE EVERY 5 MINUTES FOR 3 DOSES AS NEEDED FOR CHEST PAIN 25 tablet 5   sacubitril-valsartan (ENTRESTO) 24-26 MG Take 1 tablet by mouth 2 (two) times daily. 60 tablet 1    spironolactone (ALDACTONE) 25 MG tablet Take 0.5 tablets (12.5 mg total) by mouth daily. 30 tablet 3   thiamine 100 MG tablet Take 1 tablet (100 mg total) by mouth daily.     Current Facility-Administered Medications on File Prior to Visit  Medication Dose Route Frequency Provider Last Rate Last Admin   regadenoson (LEXISCAN) injection SOLN 0.4 mg  0.4 mg Intravenous Once Jerline Pain, MD       sodium chloride flush (  NS) 0.9 % injection 3 mL  3 mL Intravenous Q12H Marylu Lund., NP        Allergies  Allergen Reactions   Morphine And Related Other (See Comments)    dont remember the symptoms. Has tolerated dilaudid and oxycodone     Assessment/Plan:  1. CHF -  Chronic combined systolic (congestive) and diastolic (congestive) heart failure (HCC) Assessment: Blood pressure on the low side today in clinic Was 118/82 yesterday at MD Patient reports much higher readings at home, but unsure technique, validity of cuff Patient drinks a large amount of alcohol. We discussed its effects on the heart and I encouraged him to stop or cut back HR is elevated Medication compliance is ok, but could be improved Euvolemic on exam, does not weight himself at home. Was asked to start and record weights K was 5.1 on latest labs, scr stable  Plan: Increase metoprolol succinate to 37.'5mg'$  daily. Will increase from 12.'5mg'$  to '25mg'$  daily for 1 week then increase to a total of 37.'5mg'$  daily. Patient would like to split it into 1 tab in the AM and 0.5 tablet at night For now, continue Entresto 24/'26mg'$  BID and spironolactone 12.'5mg'$  daily. I asked him to check his BP once a day and record the readings. Please call me with any dizziness Start weighing daily. Write the weights down. Please call us if you gain 3 or more pounds in a day or 5 or more pounds in a week Watch sodium intake- max '2000mg'$  per day Decrease or stop drinking alcohol. Patient not really interested in quitting drinking or smoking. We  did discuss the negative health impacts and its more than likely cause or contributor of his HF Follow up in clinic in 2 weeks   Thank you   Ramond Dial, Pharm.D, BCPS, CPP [facility] Division of [facility] [address], [place], [place] [address]  Phone: [phone]; Fax: [phone]

## 2022-09-07 ENCOUNTER — Other Ambulatory Visit: Payer: 59

## 2022-09-07 ENCOUNTER — Ambulatory Visit: Payer: 59

## 2022-09-17 ENCOUNTER — Other Ambulatory Visit: Payer: Self-pay | Admitting: Cardiology

## 2022-09-18 ENCOUNTER — Ambulatory Visit: Payer: 59 | Attending: Cardiovascular Disease | Admitting: Pharmacist

## 2022-09-18 VITALS — BP 110/80 | HR 128 | Wt 156.0 lb

## 2022-09-18 DIAGNOSIS — I5042 Chronic combined systolic (congestive) and diastolic (congestive) heart failure: Secondary | ICD-10-CM

## 2022-09-18 MED ORDER — DAPAGLIFLOZIN PROPANEDIOL 10 MG PO TABS: 10.0000 mg | ORAL_TABLET | Freq: Every day | ORAL | 11 refills | Status: DC

## 2022-09-18 MED ORDER — METOPROLOL SUCCINATE ER 25 MG PO TB24: 25.0000 mg | ORAL_TABLET | Freq: Two times a day (BID) | ORAL | 3 refills | Status: DC

## 2022-09-18 NOTE — Progress Notes (Signed)
Patient ID: Jeremiah Short                 DOB: 1961/07/15                      MRN: [id number]     HPI: Jeremiah Short is a 61 y.o. male patient of Dr. [name] referred by Dr. Ambrose Pancoast, NP to pharmacy clinic for HF medication management. PMH is significant for CAD s/p DES-LAD in 2009 and 2017, chronic combined systolic and diastolic heart failure, aortic atherosclerosis, hyperlipidemia, emphysema, barrett's esophagus, and tobacco abuse . Most recent LVEF 25-30% on 07/19/22. He underwent a L/R Heart cath due to new depressed EF which showed normal/low right heart pressures, widely patent left main and patient LAD stents with mild in-stent restenosis of the mid vessel. At his f/u visit with [name], BP was elevated along with HR. Patient reported having a history of substance abuse with EtOH and is currently working on reducing his consumption and lieu of his reduced heart function. Spironolactone 12.'5mg'$  daily and Entresto 24/'26mg'$  twice a day were added. Repeat labs showed a stable scr of 0.84, K 5.1.   At last appointment patient denied trying to decrease alcohol intake and did not have an interest in stopping. BP in clinic was low and did not match the home readings he provided, although he was not checking often. HR was >100. Metoprolol was increased to 37.'5mg'$  daily.  Patient presents to clinic for follow up. He brings in his home BP cuff and blood pressure readings. He states he missed maybe a couple evening doses, took 100% of AM medications. Still drinking alcohol and not interested in stopping at this time. Still eating high sodium foods. Luckily no swelling or SOB. Little dizzy when bending over and standing up. States it tolerable, but wouldn't want it to be any worse.  Home BP cuff: 110/86 HR 125 Home BP cuff: 114/84 HR 120 Clinic BP cuff: 110/80  Home BP cuff: 114/84  Current CHF meds: Spironolactone 12.'5mg'$  daily, Entresto 24/'26mg'$  twice a day, metoprolol 37.'5mg'$  daily, isosorbide '60mg'$   daily Previously tried:  Adherence Assessment  Do you ever forget to take your medication? '[x]'$ Yes '[]'$ No  Do you ever skip doses due to side effects? '[]'$ Yes '[x]'$ No  Do you have trouble affording your medicines? '[]'$ Yes '[x]'$ No  Are you ever unable to pick up your medication due to transportation difficulties? '[]'$ Yes '[x]'$ No  Do you ever stop taking your medications because you don't believe they are helping? '[]'$ Yes '[x]'$ No  Do you check your weight daily? '[x]'$ Yes '[]'$ No   Adherence strategy: Advised to associate taking medications with an activity he does every day. Set an alarm  Barriers to obtaining medications: none  BP goal: <130/80  Family History:  Family History  Problem Relation Age of Onset   Lung cancer Mother    Heart disease Father        has pacemaker   Breast cancer Sister    Colon cancer Neg Hx    Colon polyps Neg Hx    Esophageal cancer Neg Hx    Kidney disease Neg Hx    Diabetes Neg Hx    Stomach cancer Neg Hx    Rectal cancer Neg Hx    Inflammatory bowel disease Neg Hx    Liver disease Neg Hx    Pancreatic cancer Neg Hx    Social History: vodka 1/2 gallon per week, 1 pack per day, no illicit drugs, coughs to much when he  smokes pot  Diet: doesn't usually add to food Canned soup Eats a lot fast food  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[employer], mining, 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  Past Medical History:   Diagnosis Date   Anxiety    Aortic atherosclerosis ([place]) 02/24/2021   Barrett's esophagus    Cardiomyopathy    Echocardiogram 10/23: EF 25-30, normal RVSF, mild MR, mild dilation of aortic root 41 mm, mild dilation of asc aorta 41 mm, RAP 8   Closed compression fracture of body of L1 vertebra ([place]) 02/24/2021   Coronary artery disease    Diverticulosis    Emphysema lung ([place]) 02/24/2021   Esophageal stricture    ETOH abuse    GERD (gastroesophageal reflux disease)    Hiatal hernia    History of ETOH abuse    Hypertension    Major depressive disorder    Myocardial infarction [facility]) 2009   Pure hypercholesterolemia    Smoker    Stented coronary artery    Tubular adenoma of colon 2012   Wears glasses     Current Outpatient Medications on File Prior to Visit  Medication Sig Dispense Refill   albuterol (VENTOLIN HFA) 108 (90 Base) MCG/ACT inhaler TAKE 2 PUFFS BY MOUTH EVERY 6 HOURS AS NEEDED FOR WHEEZE OR SHORTNESS OF BREATH 8.5 each 1   aspirin EC 81 MG EC tablet Take 1 tablet (81 mg total) by mouth daily.     atorvastatin (LIPITOR) 40 MG tablet TAKE 1 TABLET BY MOUTH DAILY AT 6 PM. 90 tablet 1   clopidogrel (PLAVIX) 75 MG tablet Take 1 tablet (75 mg total) by mouth daily. 90 tablet 3   folic acid (FOLVITE) 1 MG tablet Take 1 tablet (1 mg total) by mouth daily.     isosorbide mononitrate (IMDUR) 60 MG 24 hr tablet Take 1 tablet (60 mg total) by mouth daily. 90 tablet 3   lansoprazole (PREVACID) 30 MG capsule TAKE 1 CAPSULE BY MOUTH DAILY BEFORE BREAKFAST. 90 capsule 2   Methylcobalamin (B-12) 5000 MCG TBDP Take 0.5 tablets by mouth daily.     Multiple Vitamin (MULTIVITAMIN) capsule Take 1 capsule by mouth daily.     nitroGLYCERIN (NITROSTAT) 0.4 MG SL tablet PLACE 1 TABLET UNDER THE TONGUE EVERY 5 MINUTES FOR 3 DOSES AS NEEDED FOR CHEST PAIN 25 tablet 5   sacubitril-valsartan (ENTRESTO) 24-26 MG Take 1 tablet by mouth 2 (two) times daily. 60 tablet 1   spironolactone (ALDACTONE) 25  MG tablet Take 0.5 tablets (12.5 mg total) by mouth daily. 30 tablet 3   thiamine 100 MG tablet Take 1 tablet (100 mg total) by mouth daily.     Current Facility-Administered Medications on File Prior to Visit  Medication Dose Route Frequency Provider Last Rate Last Admin   regadenoson (LEXISCAN) injection SOLN 0.4 mg  0.4 mg Intravenous Once Jerline Pain, MD  sodium chloride flush (NS) 0.9 % injection 3 mL  3 mL Intravenous Q12H Marylu Lund., NP        Allergies  Allergen Reactions   Morphine And Related Other (See Comments)    dont remember the symptoms. Has tolerated dilaudid and oxycodone     Assessment/Plan:  1. CHF -  Chronic combined systolic (congestive) and diastolic (congestive) heart failure (HCC) Assessment: Weight is stable Blood pressure mainly in the one teens, diastolic runs about 4 points higher on home cuff, so probably around 80 HR improved at home, but today very high at 128- reports he took his metoprolol last night and this AM Has some dizziness with bending over- has to bend over a bunch at work- tolerable at this point Not interested in decreasing or stopping smoking or alcohol, understands the adverse health effects  Plan:  Increase metoprolol succinate to '50mg'$  daily. Patient prefers to take '25mg'$  BID Add Farxiga '10mg'$  daily- patient was able to get a copay card Continue spironolactone 12.'5mg'$  daily, Entresto 24/'26mg'$  twice a day and isosorbide '60mg'$  daily Continue to check weight and BP at home Follow up with [name] in January, can see back if needed He will need a BMP at [address]    Thank you   Ramond Dial, Pharm.D, BCPS, CPP [facility] of [facility] [address], [place], [place] [address]  Phone: [phone]; Fax: [phone]

## 2022-09-18 NOTE — Assessment & Plan Note (Signed)
Assessment: Weight is stable Blood pressure mainly in the one teens, diastolic runs about 4 points higher on home cuff, so probably around 80 HR improved at home, but today very high at 128- reports he took his metoprolol last night and this AM Has some dizziness with bending over- has to bend over a bunch at work- tolerable at this point Not interested in decreasing or stopping smoking or alcohol, understands the adverse health effects  Plan:  Increase metoprolol succinate to '50mg'$  daily. Patient prefers to take '25mg'$  BID Add Farxiga '10mg'$  daily- patient was able to get a copay card Continue spironolactone 12.'5mg'$  daily, Entresto 24/'26mg'$  twice a day and isosorbide '60mg'$  daily Continue to check weight and BP at home Follow up with [name] in January, can see back if needed He will need a BMP at [address]

## 2022-09-18 NOTE — Patient Instructions (Addendum)
Please increase metoprolol to '50mg'$  daily. You may take all '50mg'$  at one time or slip it into 2 doses. Please start taking Farxiga '10mg'$  daily Continue Spironolactone 12.'5mg'$  daily, Entresto 24/'26mg'$  twice a day and isosorbide '60mg'$  daily Please continue checking blood pressure and weight daily Follow up with [name] in January

## 2022-09-22 NOTE — Progress Notes (Signed)
Reviewed pt chart for pre-op interview for surgery scheduled on 10-04-2022 by Dr Johney Maine.  Noted pt has ef 25% by echo in epic 07-19-2022. Per anesthesia guidelines for ambulatory surgery center pt is not candidate for [facility] due to ef less than 40%. Called and spoke w/ triage nurse, Abigail Butts, at Dr Johney Maine office. Informed her pt will need to be done main OR and why.

## 2022-09-26 NOTE — Patient Instructions (Addendum)
DUE TO COVID-19 ONLY TWO VISITORS  (aged 62 and older)  ARE ALLOWED TO COME WITH YOU AND STAY IN THE WAITING ROOM ONLY DURING PRE OP AND PROCEDURE.   **NO VISITORS ARE ALLOWED IN THE SHORT STAY AREA OR RECOVERY ROOM!!**  IF YOU WILL BE ADMITTED INTO THE HOSPITAL YOU ARE ALLOWED ONLY FOUR SUPPORT PEOPLE DURING VISITATION HOURS ONLY (7 AM -8PM)   The support person(s) must pass our screening, gel in and out, and wear a mask at all times, including in the patient's room. Patients must also wear a mask when staff or their support person are in the room. Visitors GUEST BADGE MUST BE WORN VISIBLY  One adult visitor may remain with you overnight and MUST be in the room by 8 P.M.     Your procedure is scheduled on: 10/04/22   Report to [facility] Main Entrance    Report to admitting at : 5:30 AM   Call this number if you have problems the morning of surgery [phone]    Clear liquids starting the day before surgery until 5:30 AM DAY OF SURGERY. Drink plenty clear liquids the day before surgery.  Water Black Coffee (sugar ok, NO MILK/CREAM OR CREAMERS)  Tea (sugar ok, NO MILK/CREAM OR CREAMERS) regular and decaf                             Plain Jell-O (NO RED)                                           Fruit ices (not with fruit pulp, NO RED)                                     Popsicles (NO RED)                                                                  Juice: apple, WHITE grape, WHITE cranberry Sports drinks like Gatorade (NO RED)   The day of surgery:  Drink ONE (1) Pre-Surgery Clear Ensure or G2 at : 5:30 AM the morning of surgery. Drink in one sitting. Do not sip.  This drink was given to you during your hospital  pre-op appointment visit. Nothing else to drink after completing the  Pre-Surgery Clear Ensure or G2.          If you have questions, please contact your surgeon's office. FOLLOW BOWEL PREP AND ANY ADDITIONAL PRE OP INSTRUCTIONS YOU RECEIVED FROM YOUR  SURGEON'S OFFICE!!!    BOWEL PREP INSTRUCTIONS  for Anal/Rectal Surgery:    Obtain a bottle of Milk of Magnesia at a pharmacy     DAY PRIOR TO SURGERY:   Switch to drinking liquids or pureed foods only  Drink plenty of liquids all day to avoid getting dehydrated   10:00am: Take 2 oz (4 tablespoons) Milk of Magnesia.   2:00pm:  Take 2 oz (4 tablespoons) Milk of Magnesia.   Midnight:  Do not eat anything solid after bedtime (midnight) the night before  your surgery.  BUT DO drink plenty of clear liquids (Water, Gatorade, juice, soda, coffee, tea, broths, etc.) up to 2 hours prior to surgery to avoid getting dehydrated.   MORNING OF SURGERY   Remember to not to eat anything solid that morning  Hold or take medications as recommended by the hospital staff at your Preoperative visit   Stop drinking liquids before you leave the house (>3 hours prior to surgery)    If you have questions or problems,  please call [place]  to speak to someone in the clinic department at our office   Oral Hygiene is also important to reduce your risk of infection.                                    Remember - BRUSH YOUR TEETH THE MORNING OF SURGERY WITH YOUR REGULAR TOOTHPASTE  DENTURES WILL BE REMOVED PRIOR TO SURGERY PLEASE DO NOT APPLY "Poly grip" OR ADHESIVES!!!   Do NOT smoke after Midnight   Take these medicines the morning of surgery with A SIP OF WATER:   DO NOT TAKE ANY ORAL DIABETIC MEDICATIONS DAY OF YOUR SURGERY  Bring CPAP mask and tubing day of surgery.                              You may not have any metal on your body including hair pins, jewelry, and body piercing             Do not wear lotions, powders, perfumes/cologne, or deodorant              Men may shave face and neck.   Do not bring valuables to the hospital. [place].   Contacts, glasses, or bridgework may not be worn into  surgery.   Bring small overnight bag day of surgery.   DO NOT [place]. PHARMACY WILL DISPENSE MEDICATIONS LISTED ON YOUR MEDICATION LIST TO YOU DURING YOUR ADMISSION [place]!    Patients discharged on the day of surgery will not be allowed to drive home.  Someone NEEDS to stay with you for the first 24 hours after anesthesia.   Special Instructions: Bring a copy of your healthcare power of attorney and living will documents         the day of surgery if you haven't scanned them before.              Please read over the following fact sheets you were given: IF YOU HAVE QUESTIONS ABOUT YOUR PRE-OP INSTRUCTIONS PLEASE CALL [phone]    [facility] - Preparing for Surgery Before surgery, you can play an important role.  Because skin is not sterile, your skin needs to be as free of germs as possible.  You can reduce the number of germs on your skin by washing with CHG (chlorahexidine gluconate) soap before surgery.  CHG is an antiseptic cleaner which kills germs and bonds with the skin to continue killing germs even after washing. Please DO NOT use if you have an allergy to CHG or antibacterial soaps.  If your skin becomes reddened/irritated stop using the CHG and inform your nurse when you arrive at Short Stay. Do not shave (including  legs and underarms) for at least 48 hours prior to the first CHG shower.  You may shave your face/neck. Please follow these instructions carefully:  1.  Shower with CHG Soap the night before surgery and the  morning of Surgery.  2.  If you choose to wash your hair, wash your hair first as usual with your  normal  shampoo.  3.  After you shampoo, rinse your hair and body thoroughly to remove the  shampoo.                           4.  Use CHG as you would any other liquid soap.  You can apply chg directly  to the skin and wash                       Gently with a scrungie or clean washcloth.  5.  Apply the CHG Soap to your  body ONLY FROM THE NECK DOWN.   Do not use on face/ open                           Wound or open sores. Avoid contact with eyes, ears mouth and genitals (private parts).                       Wash face,  Genitals (private parts) with your normal soap.             6.  Wash thoroughly, paying special attention to the area where your surgery  will be performed.  7.  Thoroughly rinse your body with warm water from the neck down.  8.  DO NOT shower/wash with your normal soap after using and rinsing off  the CHG Soap.                9.  Pat yourself dry with a clean towel.            10.  Wear clean pajamas.            11.  Place clean sheets on your bed the night of your first shower and do not  sleep with pets. Day of Surgery : Do not apply any lotions/deodorants the morning of surgery.  Please wear clean clothes to the hospital/surgery center.  FAILURE TO FOLLOW THESE INSTRUCTIONS MAY RESULT IN THE CANCELLATION OF YOUR SURGERY PATIENT SIGNATURE_________________________________  NURSE SIGNATURE__________________________________  ________________________________________________________________________

## 2022-09-27 ENCOUNTER — Encounter (HOSPITAL_COMMUNITY): Payer: Self-pay

## 2022-09-27 ENCOUNTER — Encounter (HOSPITAL_COMMUNITY)
Admission: RE | Admit: 2022-09-27 | Discharge: 2022-09-27 | Disposition: A | Discharge status: Home / Self Care | Payer: 59 | Source: Ambulatory Visit | Attending: Gastroenterology | Admitting: Gastroenterology

## 2022-09-27 ENCOUNTER — Other Ambulatory Visit: Payer: Self-pay

## 2022-09-27 VITALS — BP 106/85 | HR 86 | Temp 98.4°F | Ht 70.0 in | Wt 156.0 lb

## 2022-09-27 DIAGNOSIS — Z01812 Encounter for preprocedural laboratory examination: Secondary | ICD-10-CM | POA: Diagnosis present

## 2022-09-27 DIAGNOSIS — R1314 Dysphagia, pharyngoesophageal phase: Secondary | ICD-10-CM | POA: Diagnosis not present

## 2022-09-27 DIAGNOSIS — I25119 Atherosclerotic heart disease of native coronary artery with unspecified angina pectoris: Secondary | ICD-10-CM | POA: Insufficient documentation

## 2022-09-27 DIAGNOSIS — I429 Cardiomyopathy, unspecified: Secondary | ICD-10-CM | POA: Diagnosis not present

## 2022-09-27 DIAGNOSIS — Z87891 Personal history of nicotine dependence: Secondary | ICD-10-CM | POA: Insufficient documentation

## 2022-09-27 DIAGNOSIS — I10 Essential (primary) hypertension: Secondary | ICD-10-CM | POA: Diagnosis not present

## 2022-09-27 LAB — CBC
HCT: 39.3 % (ref 39.0–52.0)
Hemoglobin: 13.1 g/dL (ref 13.0–17.0)
MCH: 34.1 pg — ABNORMAL HIGH (ref 26.0–34.0)
MCHC: 33.3 g/dL (ref 30.0–36.0)
MCV: 102.3 fL — ABNORMAL HIGH (ref 80.0–100.0)
Platelets: 169 10*3/uL (ref 150–400)
RBC: 3.84 MIL/uL — ABNORMAL LOW (ref 4.22–5.81)
RDW: 13.3 % (ref 11.5–15.5)
WBC: 5.6 10*3/uL (ref 4.0–10.5)
nRBC: 0 % (ref 0.0–0.2)

## 2022-09-27 NOTE — Progress Notes (Signed)
For Short Stay: [place] appointment date:  Bowel Prep reminder:   For Anesthesia: PCP - Dr. Luetta Nutting Cardiologist - Dr. Candee Furbish  Chest x-ray - 11/25/21 EKG - 07/05/22 Stress Test -  ECHO - 07/21/22 Cardiac Cath - 08/01/22 Pacemaker/ICD device last checked: Pacemaker orders received: Device Rep notified:  Spinal Cord Stimulator:  Sleep Study -  CPAP -   Fasting Blood Sugar -  Checks Blood Sugar _____ times a day Date and result of last Hgb A1c-  Last dose of GLP1 agonist-  GLP1 instructions:   Last dose of SGLT-2 inhibitors-  SGLT-2 instructions:   Blood Thinner Instructions: Pt. Was instructed by RN to get instructions about Plavix and aspirin. Aspirin Instructions: Last Dose:  Activity level: Can go up a flight of stairs and activities of daily living without stopping and without chest pain and/or shortness of breath   Able to exercise without chest pain and/or shortness of breath   Unable to go up a flight of stairs without chest pain and/or shortness of breath     Anesthesia review: Hx: MI,Smoker,CAD,HTN,emphysema.  Patient denies shortness of breath, fever, cough and chest pain at PAT appointment   Patient verbalized understanding of instructions that were given to them at the PAT appointment. Patient was also instructed that they will need to review over the PAT instructions again at home before surgery.

## 2022-09-28 ENCOUNTER — Encounter (HOSPITAL_COMMUNITY): Payer: Self-pay | Admitting: Physician Assistant

## 2022-09-28 NOTE — Progress Notes (Signed)
Anesthesia Chart Review   Case: [id number] Date/Time: 10/11/22 0830   Procedure: ESOPHAGEAL MANOMETRY (EM)   Anesthesia type: N/A   Pre-op diagnosis: esophageal dysphagia, esophageal diverticulum   [facility] / [facility]   Surgeons: Irving Copas., MD       DISCUSSION:61 y.o. smoker with h/o HTN, CAD s/p DES-LAD in 2009 and 2017, cardiomyopathy with EF 25-30%, esophageal dysphagia, esophageal diverticulum scheduled for above procedure 10/04/2022 with Dr. Justice Britain.   Pt last seen by cardiology 08/22/2022 for preoperative evaluation.  Per OV note, "-The patient affirms he has been doing well without any new cardiac symptoms. They are able to achieve 7 METS without cardiac limitations. Therefore, based on ACC/AHA guidelines, the patient would be at acceptable risk for the planned procedure without further cardiovascular testing. The patient was advised that if he develops new symptoms prior to surgery to contact our office to arrange for a follow-up visit, and he verbalized understanding.    [name] perioperative risk of a major cardiac event is 6.6% according to the Revised Cardiac Risk Index (RCRI).  Therefore, he is at low risk for perioperative complications.   His functional capacity is fair at 7.31 METs according to the Duke Activity Status Index (DASI). Recommendations: According to ACC/AHA guidelines, no further cardiovascular testing needed.  The patient may proceed to surgery at acceptable risk.   Antiplatelet and/or Anticoagulation Recommendations: Aspirin can be held for 5 days prior to his surgery.  Please resume Aspirin post operatively when it is felt to be safe from a bleeding standpoint.  Clopidogrel (Plavix) can be held for 5 days prior to his surgery and resumed as soon as possible post op."  Anticipate pt can proceed with planned procedure barring acute status change.   VS: BP 106/85   Pulse 86   Temp 36.9 C (Oral)   Ht '5\' 10"'$  (1.778  m)   Wt 70.8 kg   SpO2 98%   BMI 22.38 kg/m   PROVIDERS: Luetta Nutting, DO is PCP   Primary Cardiologist:  Candee Furbish, MD  LABS: Labs reviewed: Acceptable for surgery. (all labs ordered are listed, but only abnormal results are displayed)  Labs Reviewed  CBC - Abnormal; Notable for the following components:      Result Value   RBC 3.84 (*)    MCV 102.3 (*)    MCH 34.1 (*)    All other components within normal limits     IMAGES:   EKG:   CV: Cardiac Cath 08/01/2022 1.  Widely patent left main 2.  Patent LAD stents with mild in-stent restenosis of the mid vessel stent but no high-grade obstruction and continued patency of the large first diagonal branch 3.  Patent left circumflex no significant stenosis 4.  Patent RCA with no significant stenosis 5.  Normal/low right heart pressures   Recommend: continue medical therapy  Echo 07/19/2022 1. LV function is severely depreseed with diffuse hypokinesis, distal  inferior, distarl inferosepal and apical akinesis.. Left ventricular  ejection fraction, by estimation, is 25 to 30%. The left ventricle has  severely decreased function. Left  ventricular diastolic parameters are indeterminate.   2. Right ventricular systolic function is normal. The right ventricular  size is normal.   3. The mitral valve is normal in structure. Mild mitral valve  regurgitation.   4. The aortic valve is normal in structure. Aortic valve regurgitation is  not visualized.   5. Aortic dilatation noted. There is mild dilatation of  the aortic root,  measuring 41 mm. There is mild dilatation of the ascending aorta,  measuring 41 mm.   6. The inferior vena cava is normal in size with <50% respiratory  variability, suggesting right atrial pressure of 8 mmHg.   Myocardial Perfusion 07/12/2022   Findings are consistent with prior myocardial infarction.  No ischemia identified.  The study is high risk based upon old infarct and reduced ejection  fraction.   Patient exercised for 4 minutes and 30 seconds achieving 6.4 METS of activity with normal blood pressure response.   No ST deviation was noted.   LV perfusion is abnormal. Defect 1: There is a large defect with absent uptake present in the apical to mid anteroseptal and apex location(s) that is fixed. There is abnormal wall motion in the defect area. Consistent with infarction.   Left ventricular function is abnormal. Global function is severely reduced. Nuclear stress EF: 29 %. The left ventricular ejection fraction is severely decreased (<30%). End diastolic cavity size is mildly enlarged. Past Medical History:  Diagnosis Date   Anxiety    Aortic atherosclerosis ([place]) 02/24/2021   Barrett's esophagus    Cardiomyopathy    Echocardiogram 10/23: EF 25-30, normal RVSF, mild MR, mild dilation of aortic root 41 mm, mild dilation of asc aorta 41 mm, RAP 8   Closed compression fracture of body of L1 vertebra ([place]) 02/24/2021   Coronary artery disease    Diverticulosis    Emphysema lung ([place]) 02/24/2021   Esophageal stricture    ETOH abuse    GERD (gastroesophageal reflux disease)    Hiatal hernia    History of ETOH abuse    Hypertension    Major depressive disorder    Myocardial infarction [facility]) 2009   Pure hypercholesterolemia    Smoker    Stented coronary artery    Tubular adenoma of colon 2012   Wears glasses     Past Surgical History:  Procedure Laterality Date   APPENDECTOMY  10/02/2009   during colectomy   CARDIAC CATHETERIZATION  10/03/2007   placed 2 stents   CARDIAC CATHETERIZATION N/A 09/27/2016   Procedure: Left Heart Cath and Coronary Angiography;  Surgeon: [name], MD;  Location: [place] CV LAB;  Service: Cardiovascular;  Laterality: N/A;   CARDIAC CATHETERIZATION N/A 09/27/2016   Procedure: Coronary Stent Intervention;  Surgeon: [name], MD;  Location: [place] CV LAB;  Service: Cardiovascular;  Laterality: N/A;   CHOLECYSTECTOMY N/A 02/27/2021    Procedure: LAPAROSCOPIC CHOLECYSTECTOMY;  Surgeon: Erroll Luna, MD;  [facility];  Service: General;  Laterality: N/A;   CORONARY STENT PLACEMENT     INGUINAL HERNIA REPAIR Left 09/16/2013   Procedure: HERNIA REPAIR INGUINAL ADULT;  Surgeon: [name], MD;  Location: [place];  Service: General;  Laterality: Left;   INGUINAL HERNIA REPAIR Left 09/16/2013   Dr [name]   MOUTH SURGERY     RIGHT/LEFT HEART CATH AND CORONARY ANGIOGRAPHY N/A 08/01/2022   Procedure: RIGHT/LEFT HEART CATH AND CORONARY ANGIOGRAPHY;  Surgeon: Sherren Mocha, MD;  Location: [place] CV LAB;  Service: Cardiovascular;  Laterality: N/A;   SIGMOIDECTOMY  10/28/2009   Dr [name].  Diveetriculitis w prior abscess   TONSILLECTOMY     WISDOM TOOTH EXTRACTION      MEDICATIONS:  albuterol (VENTOLIN HFA) 108 (90 Base) MCG/ACT inhaler   aspirin EC 81 MG EC tablet   atorvastatin (LIPITOR) 40 MG tablet   clopidogrel (PLAVIX) 75 MG tablet   Cyanocobalamin (  VITAMIN B-12) 2500 MCG SUBL   dapagliflozin propanediol (FARXIGA) 10 MG TABS tablet   folic acid (FOLVITE) 1 MG tablet   isosorbide mononitrate (IMDUR) 60 MG 24 hr tablet   lansoprazole (PREVACID) 30 MG capsule   metoprolol succinate (TOPROL-XL) 25 MG 24 hr tablet   Multiple Vitamin (MULTIVITAMIN WITH MINERALS) TABS tablet   nitroGLYCERIN (NITROSTAT) 0.4 MG SL tablet   sacubitril-valsartan (ENTRESTO) 24-26 MG   spironolactone (ALDACTONE) 25 MG tablet   thiamine 100 MG tablet    sodium chloride flush (NS) 0.9 % injection 3 mL    regadenoson (LEXISCAN) injection SOLN 0.4 mg     Konrad Felix Ward, PA-C [facility]Surgical Testing [phone]

## 2022-10-04 ENCOUNTER — Ambulatory Visit (HOSPITAL_COMMUNITY): Payer: 59 | Admitting: Certified Registered Nurse Anesthetist

## 2022-10-04 ENCOUNTER — Other Ambulatory Visit: Payer: Self-pay

## 2022-10-04 ENCOUNTER — Ambulatory Visit (HOSPITAL_BASED_OUTPATIENT_CLINIC_OR_DEPARTMENT_OTHER): Payer: 59 | Admitting: Certified Registered Nurse Anesthetist

## 2022-10-04 ENCOUNTER — Encounter (HOSPITAL_COMMUNITY): Payer: Self-pay | Admitting: Surgery

## 2022-10-04 ENCOUNTER — Encounter (HOSPITAL_COMMUNITY)
Admission: RE | Disposition: A | Discharge status: Home / Self Care | Payer: Self-pay | Source: Home / Self Care | Attending: Surgery

## 2022-10-04 ENCOUNTER — Ambulatory Visit (HOSPITAL_COMMUNITY)
Admission: RE | Admit: 2022-10-04 | Discharge: 2022-10-04 | Disposition: A | Discharge status: Home / Self Care | Payer: 59 | Attending: Surgery | Admitting: Surgery

## 2022-10-04 DIAGNOSIS — F419 Anxiety disorder, unspecified: Secondary | ICD-10-CM | POA: Diagnosis not present

## 2022-10-04 DIAGNOSIS — Z9049 Acquired absence of other specified parts of digestive tract: Secondary | ICD-10-CM | POA: Insufficient documentation

## 2022-10-04 DIAGNOSIS — J449 Chronic obstructive pulmonary disease, unspecified: Secondary | ICD-10-CM | POA: Insufficient documentation

## 2022-10-04 DIAGNOSIS — K449 Diaphragmatic hernia without obstruction or gangrene: Secondary | ICD-10-CM | POA: Diagnosis not present

## 2022-10-04 DIAGNOSIS — K7460 Unspecified cirrhosis of liver: Secondary | ICD-10-CM | POA: Insufficient documentation

## 2022-10-04 DIAGNOSIS — Z7982 Long term (current) use of aspirin: Secondary | ICD-10-CM | POA: Diagnosis not present

## 2022-10-04 DIAGNOSIS — I110 Hypertensive heart disease with heart failure: Secondary | ICD-10-CM

## 2022-10-04 DIAGNOSIS — K643 Fourth degree hemorrhoids: Secondary | ICD-10-CM

## 2022-10-04 DIAGNOSIS — K642 Third degree hemorrhoids: Secondary | ICD-10-CM | POA: Diagnosis not present

## 2022-10-04 DIAGNOSIS — F17210 Nicotine dependence, cigarettes, uncomplicated: Secondary | ICD-10-CM | POA: Diagnosis not present

## 2022-10-04 DIAGNOSIS — Z7902 Long term (current) use of antithrombotics/antiplatelets: Secondary | ICD-10-CM | POA: Diagnosis not present

## 2022-10-04 DIAGNOSIS — K219 Gastro-esophageal reflux disease without esophagitis: Secondary | ICD-10-CM | POA: Insufficient documentation

## 2022-10-04 DIAGNOSIS — I2510 Atherosclerotic heart disease of native coronary artery without angina pectoris: Secondary | ICD-10-CM | POA: Insufficient documentation

## 2022-10-04 DIAGNOSIS — I252 Old myocardial infarction: Secondary | ICD-10-CM | POA: Insufficient documentation

## 2022-10-04 DIAGNOSIS — K648 Other hemorrhoids: Secondary | ICD-10-CM | POA: Diagnosis present

## 2022-10-04 DIAGNOSIS — I5042 Chronic combined systolic (congestive) and diastolic (congestive) heart failure: Secondary | ICD-10-CM | POA: Diagnosis not present

## 2022-10-04 DIAGNOSIS — Z955 Presence of coronary angioplasty implant and graft: Secondary | ICD-10-CM | POA: Diagnosis not present

## 2022-10-04 DIAGNOSIS — F32A Depression, unspecified: Secondary | ICD-10-CM | POA: Diagnosis not present

## 2022-10-04 DIAGNOSIS — K641 Second degree hemorrhoids: Secondary | ICD-10-CM | POA: Diagnosis not present

## 2022-10-04 DIAGNOSIS — I509 Heart failure, unspecified: Secondary | ICD-10-CM

## 2022-10-04 HISTORY — PX: EVALUATION UNDER ANESTHESIA WITH HEMORRHOIDECTOMY: SHX5624

## 2022-10-04 SURGERY — EXAM UNDER ANESTHESIA WITH HEMORRHOIDECTOMY
Anesthesia: General

## 2022-10-04 MED ORDER — ACETAMINOPHEN 10 MG/ML IV SOLN: 1000.0000 mg | Freq: Once | INTRAVENOUS | Status: DC | PRN

## 2022-10-04 MED ORDER — ORAL CARE MOUTH RINSE: 15.0000 mL | Freq: Once | OROMUCOSAL | Status: AC

## 2022-10-04 MED ORDER — GABAPENTIN 300 MG PO CAPS
300.0000 mg | ORAL_CAPSULE | ORAL | Status: AC
  Administered 2022-10-04: 300 mg via ORAL
  Filled 2022-10-04: qty 1

## 2022-10-04 MED ORDER — FENTANYL CITRATE PF 50 MCG/ML IJ SOSY
PREFILLED_SYRINGE | INTRAMUSCULAR | Status: AC
  Administered 2022-10-04: 50 ug via INTRAVENOUS
  Filled 2022-10-04: qty 1

## 2022-10-04 MED ORDER — LACTATED RINGERS IV SOLN: INTRAVENOUS | Status: DC

## 2022-10-04 MED ORDER — MIDAZOLAM HCL 2 MG/2ML IJ SOLN
INTRAMUSCULAR | Status: AC
  Filled 2022-10-04: qty 2

## 2022-10-04 MED ORDER — FENTANYL CITRATE (PF) 100 MCG/2ML IJ SOLN
INTRAMUSCULAR | Status: DC | PRN
  Administered 2022-10-04: 150 ug via INTRAVENOUS
  Administered 2022-10-04: 50 ug via INTRAVENOUS

## 2022-10-04 MED ORDER — ENSURE PRE-SURGERY PO LIQD
296.0000 mL | Freq: Once | ORAL | Status: DC
  Filled 2022-10-04: qty 296

## 2022-10-04 MED ORDER — ONDANSETRON HCL 4 MG/2ML IJ SOLN: 4.0000 mg | Freq: Once | INTRAMUSCULAR | Status: DC | PRN

## 2022-10-04 MED ORDER — PROPOFOL 10 MG/ML IV BOLUS
INTRAVENOUS | Status: DC | PRN
  Administered 2022-10-04: 150 mg via INTRAVENOUS

## 2022-10-04 MED ORDER — DIAZEPAM 5 MG PO TABS: 5.0000 mg | ORAL_TABLET | Freq: Three times a day (TID) | ORAL | 0 refills | Status: DC | PRN

## 2022-10-04 MED ORDER — EPHEDRINE SULFATE (PRESSORS) 50 MG/ML IJ SOLN: 10.0000 mg | Freq: Once | INTRAMUSCULAR | Status: AC

## 2022-10-04 MED ORDER — ACETAMINOPHEN 10 MG/ML IV SOLN
INTRAVENOUS | Status: AC
  Filled 2022-10-04: qty 100

## 2022-10-04 MED ORDER — PHENYLEPHRINE 80 MCG/ML (10ML) SYRINGE FOR IV PUSH (FOR BLOOD PRESSURE SUPPORT)
PREFILLED_SYRINGE | INTRAVENOUS | Status: AC
  Filled 2022-10-04: qty 10

## 2022-10-04 MED ORDER — ACETAMINOPHEN 500 MG PO TABS
1000.0000 mg | ORAL_TABLET | ORAL | Status: AC
  Administered 2022-10-04: 1000 mg via ORAL
  Filled 2022-10-04: qty 2

## 2022-10-04 MED ORDER — SODIUM CHLORIDE 0.9 % IV SOLN: 250.0000 mL | INTRAVENOUS | Status: DC | PRN

## 2022-10-04 MED ORDER — OXYCODONE HCL 5 MG PO TABS: 5.0000 mg | ORAL_TABLET | Freq: Once | ORAL | Status: DC | PRN

## 2022-10-04 MED ORDER — KETOROLAC TROMETHAMINE 15 MG/ML IJ SOLN: 15.0000 mg | Freq: Once | INTRAMUSCULAR | Status: DC

## 2022-10-04 MED ORDER — CHLORHEXIDINE GLUCONATE CLOTH 2 % EX PADS: 6.0000 | MEDICATED_PAD | Freq: Once | CUTANEOUS | Status: DC

## 2022-10-04 MED ORDER — OXYCODONE HCL 5 MG/5ML PO SOLN: 5.0000 mg | Freq: Once | ORAL | Status: DC | PRN

## 2022-10-04 MED ORDER — SUGAMMADEX SODIUM 500 MG/5ML IV SOLN
INTRAVENOUS | Status: AC
  Filled 2022-10-04: qty 5

## 2022-10-04 MED ORDER — ACETAMINOPHEN 325 MG PO TABS: 325.0000 mg | ORAL_TABLET | ORAL | Status: DC | PRN

## 2022-10-04 MED ORDER — DEXAMETHASONE SODIUM PHOSPHATE 10 MG/ML IJ SOLN
INTRAMUSCULAR | Status: AC
  Filled 2022-10-04: qty 1

## 2022-10-04 MED ORDER — ONDANSETRON HCL 4 MG/2ML IJ SOLN
INTRAMUSCULAR | Status: AC
  Filled 2022-10-04: qty 2

## 2022-10-04 MED ORDER — ROCURONIUM BROMIDE 10 MG/ML (PF) SYRINGE
PREFILLED_SYRINGE | INTRAVENOUS | Status: AC
  Filled 2022-10-04: qty 10

## 2022-10-04 MED ORDER — FENTANYL CITRATE PF 50 MCG/ML IJ SOSY: 25.0000 ug | PREFILLED_SYRINGE | INTRAMUSCULAR | Status: DC | PRN

## 2022-10-04 MED ORDER — BUPIVACAINE-EPINEPHRINE (PF) 0.5% -1:200000 IJ SOLN
INTRAMUSCULAR | Status: AC
  Filled 2022-10-04: qty 30

## 2022-10-04 MED ORDER — EPHEDRINE SULFATE (PRESSORS) 50 MG/ML IJ SOLN
INTRAMUSCULAR | Status: AC
  Administered 2022-10-04: 10 mg via INTRAVENOUS
  Filled 2022-10-04: qty 1

## 2022-10-04 MED ORDER — SODIUM CHLORIDE 0.9% FLUSH: 3.0000 mL | Freq: Two times a day (BID) | INTRAVENOUS | Status: DC

## 2022-10-04 MED ORDER — ONDANSETRON HCL 4 MG/2ML IJ SOLN
INTRAMUSCULAR | Status: DC | PRN
  Administered 2022-10-04: 4 mg via INTRAVENOUS

## 2022-10-04 MED ORDER — LIDOCAINE HCL (PF) 2 % IJ SOLN
INTRAMUSCULAR | Status: AC
  Filled 2022-10-04: qty 5

## 2022-10-04 MED ORDER — MEPERIDINE HCL 50 MG/ML IJ SOLN: 6.2500 mg | INTRAMUSCULAR | Status: DC | PRN

## 2022-10-04 MED ORDER — 0.9 % SODIUM CHLORIDE (POUR BTL) OPTIME
TOPICAL | Status: DC | PRN
  Administered 2022-10-04: 1000 mL

## 2022-10-04 MED ORDER — BUPIVACAINE LIPOSOME 1.3 % IJ SUSP
INTRAMUSCULAR | Status: AC
  Filled 2022-10-04: qty 20

## 2022-10-04 MED ORDER — KETOROLAC TROMETHAMINE 30 MG/ML IJ SOLN
INTRAMUSCULAR | Status: AC
  Administered 2022-10-04: 15 mg
  Filled 2022-10-04: qty 1

## 2022-10-04 MED ORDER — ESMOLOL HCL 100 MG/10ML IV SOLN
INTRAVENOUS | Status: DC | PRN
  Administered 2022-10-04 (×3): 10 mg via INTRAVENOUS

## 2022-10-04 MED ORDER — DEXAMETHASONE SODIUM PHOSPHATE 4 MG/ML IJ SOLN
INTRAMUSCULAR | Status: DC | PRN
  Administered 2022-10-04: 8 mg via INTRAVENOUS

## 2022-10-04 MED ORDER — PHENYLEPHRINE 80 MCG/ML (10ML) SYRINGE FOR IV PUSH (FOR BLOOD PRESSURE SUPPORT)
PREFILLED_SYRINGE | INTRAVENOUS | Status: DC | PRN
  Administered 2022-10-04 (×4): 80 ug via INTRAVENOUS
  Administered 2022-10-04 (×2): 160 ug via INTRAVENOUS

## 2022-10-04 MED ORDER — BUPIVACAINE-EPINEPHRINE (PF) 0.5% -1:200000 IJ SOLN
INTRAMUSCULAR | Status: DC | PRN
  Administered 2022-10-04: 50 mL

## 2022-10-04 MED ORDER — ROCURONIUM BROMIDE 10 MG/ML (PF) SYRINGE
PREFILLED_SYRINGE | INTRAVENOUS | Status: DC | PRN
  Administered 2022-10-04: 50 mg via INTRAVENOUS

## 2022-10-04 MED ORDER — ACETAMINOPHEN 160 MG/5ML PO SOLN: 325.0000 mg | ORAL | Status: DC | PRN

## 2022-10-04 MED ORDER — OXYCODONE HCL 5 MG PO TABS: 5.0000 mg | ORAL_TABLET | Freq: Four times a day (QID) | ORAL | 0 refills | Status: DC | PRN

## 2022-10-04 MED ORDER — MIDAZOLAM HCL 5 MG/5ML IJ SOLN
INTRAMUSCULAR | Status: DC | PRN
  Administered 2022-10-04: 2 mg via INTRAVENOUS

## 2022-10-04 MED ORDER — PROPOFOL 10 MG/ML IV BOLUS
INTRAVENOUS | Status: AC
  Filled 2022-10-04: qty 20

## 2022-10-04 MED ORDER — DIBUCAINE (PERIANAL) 1 % EX OINT
TOPICAL_OINTMENT | CUTANEOUS | Status: DC | PRN
  Administered 2022-10-04: 1 via RECTAL

## 2022-10-04 MED ORDER — FENTANYL CITRATE (PF) 250 MCG/5ML IJ SOLN
INTRAMUSCULAR | Status: AC
  Filled 2022-10-04: qty 5

## 2022-10-04 MED ORDER — CHLORHEXIDINE GLUCONATE 0.12 % MT SOLN
15.0000 mL | Freq: Once | OROMUCOSAL | Status: AC
  Administered 2022-10-04: 15 mL via OROMUCOSAL

## 2022-10-04 MED ORDER — SODIUM CHLORIDE 0.9 % IV SOLN
2.0000 g | INTRAVENOUS | Status: AC
  Administered 2022-10-04: 2 g via INTRAVENOUS
  Filled 2022-10-04: qty 20

## 2022-10-04 MED ORDER — ESMOLOL HCL 100 MG/10ML IV SOLN
INTRAVENOUS | Status: AC
  Filled 2022-10-04: qty 10

## 2022-10-04 MED ORDER — SODIUM CHLORIDE 0.9% FLUSH: 3.0000 mL | INTRAVENOUS | Status: DC | PRN

## 2022-10-04 MED ORDER — BUPIVACAINE LIPOSOME 1.3 % IJ SUSP: 20.0000 mL | Freq: Once | INTRAMUSCULAR | Status: DC

## 2022-10-04 MED ORDER — SUGAMMADEX SODIUM 500 MG/5ML IV SOLN
INTRAVENOUS | Status: DC | PRN
  Administered 2022-10-04: 300 mg via INTRAVENOUS

## 2022-10-04 MED ORDER — DIBUCAINE (PERIANAL) 1 % EX OINT
TOPICAL_OINTMENT | CUTANEOUS | Status: AC
  Filled 2022-10-04: qty 28

## 2022-10-04 MED ORDER — LIDOCAINE 2% (20 MG/ML) 5 ML SYRINGE
INTRAMUSCULAR | Status: DC | PRN
  Administered 2022-10-04: 100 mg via INTRAVENOUS

## 2022-10-04 SURGICAL SUPPLY — 44 items
APL SKNCLS STERI-STRIP NONHPOA (GAUZE/BANDAGES/DRESSINGS) ×1
BAG COUNTER SPONGE SURGICOUNT (BAG) IMPLANT
BAG SPNG CNTER NS LX DISP (BAG)
BALL CTTN STRL GZE (GAUZE/BANDAGES/DRESSINGS) ×1
BENZOIN TINCTURE PRP APPL 2/3 (GAUZE/BANDAGES/DRESSINGS) ×2 IMPLANT
BLADE SURG 15 STRL LF DISP TIS (BLADE) IMPLANT
BLADE SURG 15 STRL SS (BLADE)
BNDG GAUZE DERMACEA FLUFF 4 (GAUZE/BANDAGES/DRESSINGS) IMPLANT
BNDG GZE DERMACEA 4 6PLY (GAUZE/BANDAGES/DRESSINGS) ×1
BRIEF MESH DISP LRG (UNDERPADS AND DIAPERS) ×2 IMPLANT
CNTNR URN SCR LID CUP LEK RST (MISCELLANEOUS) ×2 IMPLANT
CONT SPEC 4OZ STRL OR WHT (MISCELLANEOUS) ×1
COTTON BALL STERILE (GAUZE/BANDAGES/DRESSINGS) ×1
COTTON BALL STERILE 2 PK (GAUZE/BANDAGES/DRESSINGS) IMPLANT
COVER SURGICAL LIGHT HANDLE (MISCELLANEOUS) ×2 IMPLANT
DRAPE LAPAROTOMY T 102X78X121 (DRAPES) ×2 IMPLANT
ELECT REM PT RETURN 15FT ADLT (MISCELLANEOUS) ×2 IMPLANT
GAUZE 4X4 16PLY ~~LOC~~+RFID DBL (SPONGE) ×2 IMPLANT
GAUZE PAD ABD 8X10 STRL (GAUZE/BANDAGES/DRESSINGS) IMPLANT
GAUZE SPONGE 4X4 12PLY STRL (GAUZE/BANDAGES/DRESSINGS) IMPLANT
GLOVE ECLIPSE 8.0 STRL XLNG CF (GLOVE) ×2 IMPLANT
GLOVE INDICATOR 8.0 STRL GRN (GLOVE) ×2 IMPLANT
GOWN STRL REUS W/ TWL XL LVL3 (GOWN DISPOSABLE) ×6 IMPLANT
GOWN STRL REUS W/TWL XL LVL3 (GOWN DISPOSABLE) ×3
KIT BASIN OR (CUSTOM PROCEDURE TRAY) ×2 IMPLANT
KIT TURNOVER KIT A (KITS) IMPLANT
LOOP VESSEL MAXI BLUE (MISCELLANEOUS) IMPLANT
NEEDLE HYPO 22GX1.5 SAFETY (NEEDLE) ×2 IMPLANT
PACK BASIC VI WITH GOWN DISP (CUSTOM PROCEDURE TRAY) ×2 IMPLANT
PENCIL SMOKE EVACUATOR (MISCELLANEOUS) IMPLANT
SCRUB CHG 4% DYNA-HEX 4OZ (MISCELLANEOUS) ×2 IMPLANT
SHEARS HARMONIC 9CM CVD (BLADE) IMPLANT
SPIKE FLUID TRANSFER (MISCELLANEOUS) ×2 IMPLANT
SURGILUBE 2OZ TUBE FLIPTOP (MISCELLANEOUS) ×2 IMPLANT
SUT CHROMIC 2 0 SH (SUTURE) ×2 IMPLANT
SUT CHROMIC 3 0 SH 27 (SUTURE) IMPLANT
SUT VIC AB 2-0 SH 27 (SUTURE)
SUT VIC AB 2-0 SH 27X BRD (SUTURE) IMPLANT
SUT VIC AB 2-0 UR6 27 (SUTURE) ×12 IMPLANT
SYR 20ML LL LF (SYRINGE) ×2 IMPLANT
SYR 3ML LL SCALE MARK (SYRINGE) IMPLANT
TOWEL OR 17X26 10 PK STRL BLUE (TOWEL DISPOSABLE) ×2 IMPLANT
TOWEL OR NON WOVEN STRL DISP B (DISPOSABLE) ×2 IMPLANT
YANKAUER SUCT BULB TIP 10FT TU (MISCELLANEOUS) ×2 IMPLANT

## 2022-10-04 NOTE — Anesthesia Procedure Notes (Signed)
Procedure Name: Intubation Date/Time: 10/04/2022 8:34 AM  Performed by: Deliah Boston, CRNAPre-anesthesia Checklist: Patient identified, Emergency Drugs available, Suction available and Patient being monitored Patient Re-evaluated:Patient Re-evaluated prior to induction Oxygen Delivery Method: Circle system utilized Preoxygenation: Pre-oxygenation with 100% oxygen Induction Type: IV induction Ventilation: Mask ventilation without difficulty Laryngoscope Size: Mac and 4 Grade View: Grade II Tube type: Oral Tube size: 7.5 mm Number of attempts: 1 Airway Equipment and Method: Stylet Placement Confirmation: ETT inserted through vocal cords under direct vision, positive ETCO2 and breath sounds checked- equal and bilateral Secured at: 24 cm Tube secured with: Tape Dental Injury: Teeth and Oropharynx as per pre-operative assessment  Comments: Anterior larynx

## 2022-10-04 NOTE — Anesthesia Preprocedure Evaluation (Signed)
Anesthesia Evaluation  Patient identified by MRN, date of birth, ID band Patient awake    Reviewed: Allergy & Precautions, NPO status , Patient's Chart, lab work & pertinent test results, reviewed documented beta blocker date and time , Unable to perform ROS - Chart review only  Airway Mallampati: II  TM Distance: >3 FB Neck ROM: Full   Comment: Long thick beard Dental  (+) Poor Dentition, Dental Advisory Given, Missing,    Pulmonary COPD, Current Smoker 30 pack year history, 3/4ppd currently No inhalers    Pulmonary exam normal breath sounds clear to auscultation       Cardiovascular hypertension, Pt. on medications and Pt. on home beta blockers + CAD, + Past MI, + Cardiac Stents and +CHF (LVEF 37-62%, grade 1 diastolic dysfunction)  Normal cardiovascular exam Rhythm:Regular Rate:Normal  Last echo 2018: - Left ventricle: The cavity size was normal. Wall thickness was  increased in a pattern of mild LVH. Systolic function was mildly  reduced. The estimated ejection fraction was in the range of 45%  to 50%. No mural thrombus with Definity contrast. Distal  anterior, anteroseptal, apical and inferoapical akinesis  suggestive of infarct. Doppler parameters are consistent with  abnormal left ventricular relaxation (grade 1 diastolic  dysfunction). The E/e&' ratio is <8, suggesting normal LV filling  pressure.  - Mitral valve: Mildly thickened leaflets . There was trivial  regurgitation.  - Left atrium: The atrium was normal in size.  - Systemic veins: The IVC measures >2.1 cm, but collapses more than  50%, suggesting an elevated RA pressure of 8 mmHg.   Cath 2017: Coronary angiogram 09/27/2016: LVEF 40% with mid to distal anterior apical and inferoapical akinesis. Mild disease RCA and circumflex. Mid LAD occluded just proximal to the previously placed stent, S/P 3.0 x 22 mm resolute onyx DES, postdilated with 3.5  x 12 [place] at 16 atmospheric, 100% reduced to 0%, D2 stent jailed, balloon antroplasty with 2.0 x 12 mm balloon, 99% reduced to less than 10%, TIMI-3 to TIMI-3 in both vessels.   Neuro/Psych  PSYCHIATRIC DISORDERS Anxiety Depression    negative neurological ROS     GI/Hepatic ,GERD  Medicated and Controlled,,(+) Cirrhosis  (plt 130)    substance abuse (etOH abuse- 90 drinks/wk (4-5 drinks/d per pt))  alcohol useAcute cholecystitis  Hx barretts, esophageal stricture S/p partial colectomy 2011   Endo/Other  negative endocrine ROS    Renal/GU negative Renal ROS  negative genitourinary   Musculoskeletal negative musculoskeletal ROS (+)    Abdominal Normal abdominal exam  (+)   Peds  Hematology negative hematology ROS (+) hct 37.3, plt 130   Anesthesia Other Findings Conclusion  1.  Widely patent left main 2.  Patent LAD stents with mild in-stent restenosis of the mid vessel stent but no high-grade obstruction and continued patency of the large first diagonal branch 3.  Patent left circumflex no significant stenosis 4.  Patent RCA with no significant stenosis 5.  Normal/low right heart pressures     Reproductive/Obstetrics negative OB ROS                             Anesthesia Physical Anesthesia Plan  ASA: III  Anesthesia Plan: General   Post-op Pain Management:    Induction: Intravenous  PONV Risk Score and Plan: 3 and Ondansetron, Dexamethasone, Midazolam and Treatment may vary due to age or medical condition  Airway Management Planned: Oral ETT  Additional Equipment: None  Intra-op Plan:   Post-operative Plan: Extubation in OR  Informed Consent: I have reviewed the patients History and Physical, chart, labs and discussed the procedure including the risks, benefits and alternatives for the proposed anesthesia with the patient or authorized representative who has indicated his/her understanding and acceptance.     Dental advisory  given  Plan Discussed with: CRNA  Anesthesia Plan Comments:         Anesthesia Quick Evaluation

## 2022-10-04 NOTE — Discharge Instructions (Addendum)
##############################################  ANORECTAL SURGERY:  POST OPERATIVE INSTRUCTIONS  ######################################################################  EAT Start with a pureed / full liquid diet After 24 hours, gradually transition to a high fiber diet.    CONTROL PAIN Control pain so you can tolerate bowel movements,  walk, sleep, tolerate sneezing/coughing, and go up/down stairs.   HAVE A BOWEL MOVEMENT DAILY Keep your bowels regular to avoid problems.   Taking a fiber supplement every day to keep bowels soft.   Try a laxative to override constipation. Use an antidairrheal to slow down diarrhea.   Call if not better after 2 tries  WALK Walk an hour a day.  Control your pain to do that.   CALL IF YOU HAVE PROBLEMS/CONCERNS Call if you are still struggling despite following these instructions. Call if you have concerns not answered by these instructions  ######################################################################    Take your usually prescribed home medications including blood thinners.  DIET: Follow a light bland diet & liquids the first 24 hours after arrival home, such as soup, liquids, starches, etc.  Be sure to drink plenty of fluids.  Quickly advance to a usual solid diet within a few days.  Avoid fast food or heavy meals as your are more likely to get nauseated or have irregular bowels.  A low-fat, high-fiber diet for the rest of your life is ideal.  PAIN CONTROL: Expect swelling and discomfort in the anus/rectal area. Pain is best controlled by a usual combination of many methods TOGETHER: Warm baths/soaks or Ice packs Over the counter pain medication Prescription pain medications Topical creams    Warm water baths or ice packs (30-60 minutes up to 8 times a day, especially after bowel meovements) will help. Use ice for the first few days to help decrease swelling and bruising, then switch to heat such as warm towels, sitz baths, warm  baths, warm showers, etc to help relax tight/sore spots and speed recovery.  Some people prefer to use ice alone, heat alone, alternating between ice & heat.  Experiment to what works for you.    It is helpful to take an over-the-counter pain medication continuously for the first few weeks.  Choose one of the following that works best for you: Naproxen (Aleve, etc)  Two '220mg'$  tabs twice a day Ibuprofen (Advil, etc) Three '200mg'$  tabs four times a day (every meal & bedtime) Acetaminophen (Tylenol, etc) 500-'650mg'$  four times a day (every meal & bedtime)  A  prescription for pain medication (such as oxycodone, hydrocodone, etc) should be given to you upon discharge.  Take your pain medication as prescribed.  If you are having problems/concerns with the prescription medicine (does not control pain, nausea, vomiting, rash, itching, etc), please call us [phone] to see if we need to switch you to a different pain medicine that will work better for you and/or control your side effect better. If you need a refill on your pain medication, please contact your pharmacy.  They will contact our office to request authorization. Prescriptions will not be filled after 5 pm or on week-ends.  If can take up to 48 hours for it to be filled & ready so avoid waiting until you are down to thel ast pill.  A topical cream (Dibucaine) or a prescription for a cream (such as diltiazem 2% gel) may be given to you.  Many people find relief with topical creams.  Some people find it burns too much.  Experiment.  If it helps, use it.  If it burns, don't  using it.  You also may receive a prescription for diazepam, a muscle relaxant to help you to be able to urinate and defecate more easily.  It is safe to take a few doses with the other medications as long as you are not planning to drive or do anything intense.  Hopefully this can minimize the chance of needing a Foley catheter into your bladder     KEEP YOUR BOWELS  REGULAR The goal is one soft bowel movement a day starting now Avoid getting constipated.  Between the surgery and the pain medications, it is common to experience some constipation.  Increasing fluid intake and taking a fiber supplement (such as Metamucil, Citrucel, FiberCon, MiraLax, etc) 2-4 times a day regularly will usually help prevent this problem from occurring.  A mild laxative (prune juice, Milk of Magnesia, MiraLax, etc) should be taken according to package directions if there are no bowel movements after 48 hours. Watch out for diarrhea.  If you have many loose bowel movements, simplify your diet to bland foods & liquids for a few days.  Stop any stool softeners and decrease your fiber supplement.  Switching to mild anti-diarrheal medications (Kayopectate, Pepto Bismol) can help.  Can try an imodium/loperamide dose.  If this worsens or does not improve, please call us.  Wound Care   a. You have some fluffed gauze on top of the anus to help catch drainage and bleeding.  THERE IS NO PACKING INSIDE THE RECTUM -Let the gauze fall off with the first bowel movement or shower.  It is okay to reinforce or replace as needed.  Bleeding is common at first and occasionally tapers off   b. Place soft cotton balls on the anus/wounds and use an absorbent pad in your underwear as needed to catch any drainage and help keep the area.  Try to use cotton balls or pads over regular gauze or toilet paper as gauze will stick and pull, causing pain.  Cotton will come off more easily.   c. Keep the area clean and dry.  Bathe / shower every day.  Keep the area clean by showering / bathing over the incision / wound.   It is okay to soak an open wound to help wash it.  Consider using a squeeze bottle filled with warm water to gently wash the anal area.  Wet wipes or showers / gentle washing after bowel movements is often less traumatic than regular toilet paper.  Use a Sitz Bath 4-8 times a day for relief  A sitz  bath is a warm water bath taken in the sitting position that covers only the hips and buttocks. It may be used for either healing or hygiene purposes. Sitz baths are also used to relieve pain, itching, or muscle spasms.  Gently cleaned the area and the heat will help lower spasm and offer better pain control.    Fill the bathtub half full with warm water. Sit in the water and open the drain a little. Turn on the warm water to keep the tub half full. Keep the water running constantly. Soak in the water for 15 to 20 minutes. After the sitz bath, pat the affected area dry first.   d. You will often notice bleeding, especially with bowel movements.  This should slow down by the end of the first week of surgery.  Sitting on an ice pack can help.   e. Expect some drainage.  You often will have some blood or yellow drainage with  open wounds.  Sometimes she will get a little leaking of liquid stool until the incision/wounds have fully close down.  This should slow down by the end of the first week of surgery, but you will have occasional bleeding or drainage up to a few months after surgery.  Wear an absorbent pad or soft cotton gauze in your underwear until the drainage stops.  ACTIVITIES as tolerated:    You may resume regular (light) daily activities beginning the next day--such as daily self-care, walking, climbing stairs--gradually increasing activities as tolerated.  If you can walk 30 minutes without difficulty, it is safe to try more intense activity such as jogging, treadmill, bicycling, low-impact aerobics, swimming, etc. Save the most intensive and strenuous activity for last such as sit-ups, heavy lifting, contact sports, etc  Refrain from any heavy lifting or straining until you are off narcotics for pain control.   DO NOT PUSH THROUGH PAIN.  Let pain be your guide: If it hurts to do something, don't do it.  Pain is your body warning you to avoid that activity for another week until the pain  goes down. You may drive when you are no longer taking prescription pain medication, you can comfortably sit for long periods of time, and you can safely maneuver your car and apply brakes. You may have sexual intercourse when it is comfortable.   FOLLOW UP in our office Please call [facility] at [phone] to set up an appointment to see your surgeon in the office for a follow-up appointment approximately 3 weeks after your surgery. Make sure that you call for this appointment the day you arrive home to ensure a convenient appointment time.  8. IF YOU HAVE DISABILITY OR FAMILY LEAVE FORMS, BRING THEM TO THE OFFICE FOR PROCESSING.  DO NOT GIVE THEM TO YOUR DOCTOR.        WHEN TO CALL us [phone]: Poor pain control Reactions / problems with new medications (rash/itching, nausea, etc)  Fever over 101.5 F (38.5 C) Inability to urinate Nausea and/or vomiting Worsening swelling or bruising Continued bleeding from incision. Increased pain, redness, or drainage from the incision  The clinic staff is available to answer your questions during regular business hours (8:30am-5pm).  Please don't hesitate to call and ask to speak to one of our nurses for clinical concerns.   A surgeon from [facility] is always on call at the hospitals   If you have a medical emergency, go to the nearest emergency room or call 911.    [facility], [place], [place], [place], [place]  [address] ? MAIN: [phone] ? TOLL FREE: [phone] ? FAX [phone] [email or web address]  #####################################################

## 2022-10-04 NOTE — H&P (Signed)
10/04/2022   REFERRING PHYSICIAN: Mansouraty, Telford Nab.*  Patient Care Team: Edythe Lynn, DO as PCP - General (Family Medicine) Candee Furbish, MD (Cardiovascular Disease) Mansouraty, Telford Nab., MD (Gastroenterology) Johney Maine, Adrian Saran, MD as Consulting Provider (Colon and Rectal Surgery) Cornett, Beryle Lathe, MD as Consulting Provider (General Surgery)  PROVIDER: Hollace Kinnier, MD  [facility] MRN: [id number] DOB: September 03, 1961  SUBJECTIVE   Chief Complaint: Hemorrhoids   History of Present Illness: Jeremiah Short is a 62 y.o. male who is seen today  as an office consultation at the request of Dr. Rush Landmark  for evaluation of Hemorrhoids .   Patient followed by [facility] gastroenterology. Recalls history of hemorrhoids at sound like he had had prior banding done by gastroenterology, Dr. Fuller Plan. Not feel that worked much. At least 2 times. Recurrent bleeding. Because of recurrent bleeding and recent endoscopy confirming irritated hemorrhoids, surgical consultation offered. Does not take any fiber supplements and claims he moves his bowels once a day.  History of heartburn reflux and Barrett's esophagus with short segment. Hiatal hernia. Intermittent compliance and proton pump inhibitors. Some dysphagia. 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 That is getting worked up. Patient also has had some intermittent rectal bleeding. Has a history of diverticulitis that required percutaneous drainage of abscess and ultimately a open colectomy in 2011 by Dr. [name] with our group who is since retired. He is also had inguinal hernia repair and cholecystectomy done by Dr. [name] with our group.   History of coronary disease Followed by Dr. [name] with cardiology.Has a history of coronary artery disease status post PCI to the LAD in the setting of ST elevation c/w MI in 2009 and again in December 2017 with diagonal 2 jailed treated with angioplasty. Plan was to continue with  indefinite DAPT at the time. Plavix and aspirin. He smokes. Had decreased ejection fraction that improved to 45-50% in 2018.  Medical History:  Past Medical History:  Diagnosis Date  Anxiety  GERD (gastroesophageal reflux disease)   Patient Active Problem List  Diagnosis  Anxiety  Barrett's esophagus without dysplasia  Esophageal diverticulum  Esophageal stricture  Coronary artery disease involving native coronary artery of native heart with angina pectoris (CMS-HCC)  Hemorrhoids  Hiatal hernia with gastroesophageal reflux  Hx of adenomatous colonic polyps  Internal hemorrhoid, bleeding  Chronic combined systolic and diastolic heart failure (CMS-HCC)  Status post insertion of drug-eluting stent into right coronary artery for coronary artery disease  Chronic anticoagulation  Supraumbilical hernia without gangrene and without obstruction   History reviewed. No pertinent surgical history.   Allergies  Allergen Reactions  Morphine Unknown and Other (See Comments)  dont remember the symptoms. Has tolerated dilaudid and oxycodone   Current Outpatient Medications on File Prior to Visit  Medication Sig Dispense Refill  aspirin 81 MG chewable tablet Take by mouth  atorvastatin (LIPITOR) 80 MG tablet atorvastatin 80 mg tablet TAKE 1 TABLET (80 MG TOTAL) BY MOUTH DAILY AT 6 PM.  busPIRone (BUSPAR) 10 MG tablet Take 10 mg by mouth 2 (two) times daily  clopidogreL (PLAVIX) 75 mg tablet clopidogrel 75 mg tablet  folic acid (FOLVITE) 1 MG tablet Take by mouth  isosorbide mononitrate (IMDUR) 30 MG ER tablet isosorbide mononitrate ER 30 mg tablet,extended release 24 hr  lansoprazole (PREVACID) 30 MG DR capsule TAKE 1 CAPSULE BY MOUTH DAILY BEFORE BREAKFAST.  metoprolol succinate (TOPROL-XL) 25 MG XL tablet Take 25 mg by mouth once daily  bismuth subsalicylate (PEPTO BISMOL) 262 mg/15 mL suspension Take  by mouth (Patient not taking: Reported on 06/27/2022)  carvediloL (COREG) 3.125 MG tablet  Take by mouth (Patient not taking: Reported on 06/27/2022)  loperamide (IMODIUM) 2 mg capsule Take by mouth (Patient not taking: Reported on 06/27/2022)  losartan (COZAAR) 50 MG tablet TAKE 1 TABLET BY MOUTH DAILY. PLEASE MAKE YEARLY APPT FOR AUGUST 2022 FOR FUTURE REFILLS. (Patient not taking: Reported on 06/27/2022)  venlafaxine (EFFEXOR-XR) 75 MG XR capsule TAKE 3 CAPSULES (225 MG TOTAL) BY MOUTH DAILY WITH BREAKFAST. (Patient not taking: Reported on 06/27/2022)   No current facility-administered medications on file prior to visit.   Family History  Problem Relation Age of Onset  Skin cancer Mother  Colon cancer Mother  Breast cancer Sister    Social History   Tobacco Use  Smoking Status Some Days  Smokeless Tobacco Never    Social History   Socioeconomic History  Marital status: Divorced  Tobacco Use  Smoking status: Some Days  Smokeless tobacco: Never  Substance and Sexual Activity  Alcohol use: Yes  Drug use: Never   ############################################################  Review of Systems: A complete review of systems (ROS) was obtained from the patient. I have reviewed this information and discussed as appropriate with the patient. See HPI as well for other pertinent ROS.  Constitutional: No fevers, chills, sweats. Weight stable Eyes: No vision changes, No discharge HENT: No sore throats, nasal drainage Lymph: No neck swelling, No bruising easily Pulmonary: No cough, productive sputum CV: No orthopnea, PND . No exertional chest/neck/shoulder/arm pain. Patient can walk 20 minutes without difficulty.   GI: No personal nor family history of GI/colon cancer, inflammatory bowel disease, irritable bowel syndrome, allergy such as Celiac Sprue, dietary/dairy problems, colitis, ulcers nor gastritis. No recent sick contacts/gastroenteritis. No travel outside the country. No changes in diet.  Renal: No UTIs, No hematuria Genital: No drainage, bleeding,  masses Musculoskeletal: No severe joint pain. Good ROM major joints Skin: No sores or lesions Heme/Lymph: No easy bleeding. No swollen lymph nodes Neuro: No active seizures. No facial droop Psych: No hallucinations. No agitation  OBJECTIVE   Vitals:  06/27/22 1124  BP: 130/78  Pulse: 105  Weight: 70.3 kg (155 lb)  Height: 177.8 cm ('5\' 10"'$ )   Body mass index is 22.24 kg/m.  PHYSICAL EXAM:  Constitutional: Not cachectic. Hygeine adequate. Vitals signs as above.  Eyes: Wears glasses - vision corrected,Pupils reactive, normal extraocular movements. Sclera nonicteric Neuro: CN II-XII intact. No major focal sensory defects. No major motor deficits. Lymph: No head/neck/groin lymphadenopathy Psych: No severe agitation. No severe anxiety. Judgment & insight Adequate, Oriented x4, HENT: Normocephalic, Mucus membranes moist. No thrush. Hearing: adequate Neck: Supple, No tracheal deviation. No obvious thyromegaly Chest: No pain to chest wall compression. Good respiratory excursion. No audible wheezing CV: Pulses intact. regular. No major extremity edema Ext: No obvious deformity or contracture. Edema: Not present. No cyanosis Skin: No major subcutaneous nodules. Warm and dry Musculoskeletal: Severe joint rigidity not present. No obvious clubbing. No digital petechiae. Mobility: no assist device moving easily without restrictions  Abdomen: Flat Soft. Nondistended. Nontender. Hernia: Just above the bellybutton is a 3 x 3 cm subcutaneous mass. Suspicious for chronically incarcerated hernia. CAT scan from last year notes it is fat-containing with a more narrow or fascial defect. Not painful.. Diastasis recti: Not present. No hepatomegaly. No splenomegaly.  Genital/Pelvic: Inguinal hernia: Not present. Inguinal lymph nodes: without lymphadenopathy nor hidradenitis.   Rectal:   ##################################  Perianal skin Clean with good hygiene  Pruritis ani: Not  present Pilonidal  disease: Not present Condyloma / warts: Not present  Anal fissure: Not present Perirectal abscess/fistula Not present External hemorrhoids Not present  Digital and anoscopic rectal exam Not tolerated  Sphincter tone Normal   Hemorrhoidal piles: Friable internal hemorrhoids on all 3 piles. Left lateral most irritated. Right posterior probably largest. Right anterior thinnest. Left lateral with irritation raises suspicion that that is the one that is prolapsing.  Prostate: Normal size & no nodularity Rectovaginal septum: N/A Rectal masses: Not present  Other significant findings: Hard rocky stool in rectal vault  Patient examined w PA-S with patient in decubitus position .  ###################################    ###################################################################  Labs, Imaging and Diagnostic Testing:  Located in 'Care Everywhere' section of Epic EMR chart  PRIOR [facility] CLINIC NOTES:  Located in [place]' section of Epic EMR chart  SURGERY NOTES:  Located in [place]' section of Epic EMR chart  PATHOLOGY:  Located in [place]' section of Epic EMR chart  Assessment and Plan:  DIAGNOSES:  Diagnoses and all orders for this visit:  Internal hemorrhoid, bleeding  Hiatal hernia with gastroesophageal reflux  Barrett's esophagus without dysplasia  Chronic combined systolic and diastolic heart failure (CMS-HCC)  Status post insertion of drug-eluting stent into right coronary artery for coronary artery disease  Chronic anticoagulation  Supraumbilical hernia without gangrene and without obstruction    ASSESSMENT/PLAN  Pleasant male with recurrent episodes of hemorrhoidal bleeding with 2 prior banding events now with prolapse. All 3 piles look irritated. I think he is exhausted nonoperative management options. I recommended hemorrhoidectomy with ligation & pexy. Outpatient surgery.  The anatomy & physiology of the anorectal  region was discussed. The pathophysiology of hemorrhoids and differential diagnosis was discussed. Natural history risks without surgery was discussed. I stressed the importance of a bowel regimen to have daily soft bowel movements to minimize progression of disease. Interventions such as sclerotherapy & banding were discussed.  The patient's symptoms are not adequately controlled by medicines and other non-operative treatments. I feel the risks & problems of no surgery outweigh the operative risks; therefore, I recommended surgery to treat the hemorrhoids by ligation, pexy, and possible resection.  Risks such as bleeding, infection, urinary difficulties, injury to other organs, need for repair of tissues / organs, need for further treatment, heart attack, death, and other risks were discussed. I noted a good likelihood this will help address the problem. Goals of post-operative recovery were discussed as well. Possibility that this will not correct all symptoms was explained. Post-operative pain, bleeding, constipation, and other problems after surgery were discussed. We will work to minimize complications. Educational handouts further explaining the pathology, treatment options, and bowel regimen were given as well. Questions were answered. The patient expresses understanding & wishes to proceed with surgery.  Cleared by cardiology  STOP SMOKING! We talked to the patient about the dangers of smoking. We stressed that tobacco use dramatically increases the risk of peri-operative complications such as infection, tissue necrosis leaving to problems with incision/wound and organ healing, hernia, chronic pain, heart attack, stroke, DVT, pulmonary embolism, and death. We noted there are programs in our community to help stop smoking. Information was available.    Adin Hector, MD, FACS, MASCRS Esophageal, Gastrointestinal & Colorectal Surgery Robotic and Minimally Invasive Surgery  [facility] [address], [place], [place] [address] [phone] Fax [phone] Main  CONTACT INFORMATION:  Weekday (9AM-5PM): Call [facility] main office at  [phone]  Weeknight (5PM-9AM) or Weekend/Holiday: Check [email or web address] (password " [email or web address]") for General Surgery [facility] coverage  (Please, do not use SecureChat as it is not reliable communication to reach operating surgeons for immediate patient care given surgeries/outpatient duties/clinic/cross-coverage/off post-call which would lead to a delay in care.  Epic staff messaging available for outptient concerns, but may not be answered for 48 hours or more).    10/04/2022

## 2022-10-04 NOTE — Op Note (Signed)
10/04/2022  9:42 AM  PATIENT:  Jeremiah Short  62 y.o. male  Patient Care Team: Luetta Nutting, DO as PCP - General (Family Medicine) Jerline Pain, MD as PCP - Cardiology (Cardiology) Luetta Nutting, DO as Consulting Physician (Family Medicine) Mansouraty, Telford Nab., MD as Consulting Physician (Gastroenterology) Erroll Luna, MD as Consulting Physician (General Surgery) Michael Boston, MD as Consulting Physician (Colon and Rectal Surgery)  PRE-OPERATIVE DIAGNOSIS:  HEMORRHOIDS PROLAPSING GRADE 3  POST-OPERATIVE DIAGNOSIS:  HEMORRHOIDS PROLAPSING GRADE 2,3, & 4 WITH BLEEDING  PROCEDURE:   Internal and external hemorrhoidectomy x3 Internal hemorrhoidal ligation and pexy Anorectal examination under anesthesia  SURGEON:  Adin Hector, MD  ANESTHESIA:   General Anorectal & Local field block (0.25% bupivacaine with epinephrine mixed with Liposomal bupivacaine (Experel)   EBL:  Total I/O In: 100 [IV Piggyback:100] Out: 25 [Blood:25].  See operative record  Delay start of Pharmacological VTE agent (>24hrs) due to surgical blood loss or risk of bleeding:  NO  DRAINS: NONE  SPECIMEN:   Internal & external hemorrhoid x3  DISPOSITION OF SPECIMEN:  PATHOLOGY  COUNTS:  YES  PLAN OF CARE: Discharge home after PACU  PATIENT DISPOSITION:  PACU - hemodynamically stable.  INDICATION: Pleasant patient with struggles with hemorrhoids.  Not able to be managed in the office despite an improved bowel regimen and prior endings with gastroenterology.  Now chronically prolapsed.  Worsening bleeding.  Needs to be anticoagulated..  I recommended  examination under anesthesia and surgical treatment:  The anatomy & physiology of the anorectal region was discussed.  The pathophysiology of hemorrhoids and differential diagnosis was discussed.  Natural history risks without surgery was discussed.   I stressed the importance of a bowel regimen to have daily soft bowel movements to minimize  progression of disease.  Interventions such as sclerotherapy & banding were discussed.  The patient's symptoms are not adequately controlled by medicines and other non-operative treatments.  I feel the risks & problems of no surgery outweigh the operative risks; therefore, I recommended surgery to treat the hemorrhoids by ligation, pexy, and possible resection.  Risks such as bleeding, infection, need for further treatment, heart attack, death, and other risks were discussed.   I noted a good likelihood this will help address the problem.  Goals of post-operative recovery were discussed as well.  Possibility that this will not correct all symptoms was explained.  Post-operative pain, bleeding, constipation, urinary difficulties, and other problems after surgery were discussed.  We will work to minimize complications.   Educational handouts further explaining the pathology, treatment options, and bowel regimen were given as well.  Questions were answered.  The patient expresses understanding & wishes to proceed with surgery.  OR FINDINGS: Left lateral grade 4 hemorrhoid with irritation.  Right anterior grade 3 with irritation.  Right posterior with prior banding and scarring grade 2.  No fistula, fissure, abscess.  Rather floppy redundant rectum but no true external rectal proccidentia prolapse.  DESCRIPTION:   Informed consent was confirmed. Patient underwent general anesthesia without difficulty. Patient was placed into  prone/jacknife positioning.  The perianal region was prepped and draped in sterile fashion. Surgical time-out confirmed our plan.  I did digital rectal examination and then transitioned over to anoscopy to get a sense of the anatomy.  Findings noted above.   I proceeded to do hemorrhoidal ligation and pexy using a large self-retaining Parks anal retractor.  Occasionally alternating with a large Training and development officer.  I used a 2-0 Vicryl suture on  a UR-6 needle in a figure-of-eight  fashion 6 cm proximal to the anal verge.  I started at the largest hemorrhoid pile, left lateral which was grade 4 irritated & prolapsed.  Because of redundant hemorrhoidal tissue too bulky to merely ligate or pexy, I excised the excess internal hemorrhoid pile longitudinally in a fusiform biconcave fashion, sparing the anal canal to avoid narrowing.  I then ran that stitch longitudinally more distally to close the hemorrhoidectomy wound to the anal verge over a large Parks self-retaining anal retractor to avoid narrowing of the anal canal.  I then tied that stitch down to cause a hemorrhoidopexy.   I also had to do an excision at the  right anterior and right posterior pile locations.  I then did hemorrhoidal ligation and pexy at the other 3 hemorrhoidal columns.  At the completion of this, all 6 anorectal columns were ligated and pexied in the classic hexagonal fashion (right anterior/lateral/posterior, left anterior/lateral/posterior).    I redid anoscopy and examination.  Hemostasis was good.  I radially trimmed and closed closed the external part of the hemorrhoidectomy wounds with radial interrupted horizontal mattress 2-0 chromic suture over a large Sawyer anal retractor at the left lateral and right posterior regions, leaving the last 5 mm open to allow natural drainage.  I repeated anoscopy & examination.  At completion of this, all hemorrhoids had been removed or reduced into the rectum.  There is no more prolapse.  Internal & external anatomy was more more normal.  Hemostasis was good.  Fluffed gauze was on-laid over the perianal region.  No packing done.  Patient is being extubated go to go to the recovery room.  I had discussed postop care in detail with the patient in the preop holding area.  Instructions for post-operative recovery and prescriptions are written. I discussed operative findings, updated the patient's status, discussed probable steps to recovery, and gave postoperative  recommendations to the patient's sister, Amalia Hailey .  Recommendations were made.  Questions were answered.  She expressed understanding & appreciation.  Adin Hector, M.D., F.A.C.S. Gastrointestinal and Minimally Invasive Surgery [facility] [address], [place], [place] [address] [phone] Main / Paging

## 2022-10-04 NOTE — Anesthesia Postprocedure Evaluation (Signed)
Anesthesia Post Note  Patient: Jeremiah Short  Procedure(s) Performed: ANORECTAL EXAM UNDER ANESTHESIA WITH HEMORRHOIDECTOMY WITH LIGATION AND HEMORRHOIDOPEXY     Patient location during evaluation: PACU Anesthesia Type: General Level of consciousness: sedated and awake Pain management: pain level controlled Vital Signs Assessment: post-procedure vital signs reviewed and stable Respiratory status: spontaneous breathing Cardiovascular status: stable Postop Assessment: no apparent nausea or vomiting Anesthetic complications: no  No notable events documented.  Last Vitals:  Vitals:   10/04/22 0953 10/04/22 1000  BP: (!) 135/97   Pulse: 98 94  Resp: 15 17  Temp: (!) 35.9 C   SpO2: 97% 100%    Last Pain:  Vitals:   10/04/22 0953  TempSrc:   PainSc: [name]

## 2022-10-04 NOTE — Interval H&P Note (Signed)
History and Physical Interval Note:  10/04/2022 7:24 AM  Jeremiah Short  has presented today for surgery, with the diagnosis of HEMORRHOIDS PROLAPSING GRADE 3.  The various methods of treatment have been discussed with the patient and family. After consideration of risks, benefits and other options for treatment, the patient has consented to  Procedure(s) with comments: ANORECTAL EXAM UNDER ANESTHESIA WITH HEMORRHOIDECTOMY WITH LIGATION AND HEMORRHOIDOPEXY (N/A) - GEN AND LOCAL as a surgical intervention.  The patient's history has been reviewed, patient examined, no change in status, stable for surgery.  I have reviewed the patient's chart and labs.  Questions were answered to the patient's satisfaction.    I have re-reviewed the the patient's records, history, medications, and allergies.  I have re-examined the patient.  I again discussed intraoperative plans and goals of post-operative recovery.  The patient agrees to proceed.  Jeremiah Short  03-20-1961 [id number]  Patient Care Team: Luetta Nutting, DO as PCP - General (Family Medicine) Jerline Pain, MD as PCP - Cardiology (Cardiology) Luetta Nutting, DO as Consulting Physician (Family Medicine) Mansouraty, Telford Nab., MD as Consulting Physician (Gastroenterology) Erroll Luna, MD as Consulting Physician (General Surgery) Michael Boston, MD as Consulting Physician (Colon and Rectal Surgery)  Patient Active Problem List   Diagnosis Date Noted   Dyspnea 09/05/2022   Cardiomyopathy 07/21/2022   Internal hemorrhoid, bleeding 06/27/2022   Hx of adenomatous colonic polyps 06/03/2022   Hemorrhoids 06/03/2022   Abnormal findings on esophagogastroduodenoscopy (EGD) 06/03/2022   Hiatal hernia 06/03/2022   Esophageal stricture 06/03/2022   Esophageal diverticulum 06/03/2022   Esophageal dysphagia 06/03/2022   Smoking greater than 20 pack years 02/09/2022   SIRS (systemic inflammatory response syndrome) ([place]) 02/24/2021   Coronary artery  disease involving native coronary artery of native heart with angina pectoris ([place]) 02/24/2021   Chronic combined systolic (congestive) and diastolic (congestive) heart failure ([place]) 02/24/2021   Aortic atherosclerosis ([place]) 02/24/2021   Emphysema lung ([place]) 02/24/2021   Closed compression fracture of body of L1 vertebra ([place]) 02/24/2021   MDD (major depressive disorder) 02/02/2021   Alcohol use disorder 05/28/2020   Calculus of gallbladder without cholecystitis without obstruction 05/17/2020   Barrett's esophagus without dysplasia 05/17/2020   Macrocytic anemia 03/10/2020   Decreased appetite 03/10/2020   Fatigue 03/10/2020   Sprain of acromioclavicular ligament 09/25/2019   Low testosterone 04/30/2019   MDD (major depressive disorder), recurrent episode, moderate ([place]) 12/17/2018   Chronic bilateral low back pain without sciatica 06/17/2018   Seborrheic keratosis 05/30/2018   Well adult exam 05/06/2018   Nicotine dependence, cigarettes, uncomplicated 01/60/1093   Pure hypercholesterolemia    Anxiety     Past Medical History:  Diagnosis Date   Anxiety    Aortic atherosclerosis ([place]) 02/24/2021   Barrett's esophagus    Cardiomyopathy    Echocardiogram 10/23: EF 25-30, normal RVSF, mild MR, mild dilation of aortic root 41 mm, mild dilation of asc aorta 41 mm, RAP 8   Closed compression fracture of body of L1 vertebra ([place]) 02/24/2021   Coronary artery disease    Diverticulosis    Emphysema lung ([place]) 02/24/2021   Esophageal stricture    ETOH abuse    GERD (gastroesophageal reflux disease)    Hiatal hernia    History of ETOH abuse    Hypertension    Major depressive disorder    Myocardial infarction [facility]) 2009   Pure hypercholesterolemia    Smoker    Stented coronary artery    Tubular adenoma of colon 2012  Wears glasses     Past Surgical History:  Procedure Laterality Date   APPENDECTOMY  10/02/2009   during colectomy   CARDIAC CATHETERIZATION  10/03/2007    placed 2 stents   CARDIAC CATHETERIZATION N/A 09/27/2016   Procedure: Left Heart Cath and Coronary Angiography;  Surgeon: [name], MD;  Location: [place] [facility];  Service: Cardiovascular;  Laterality: N/A;   CARDIAC CATHETERIZATION N/A 09/27/2016   Procedure: Coronary Stent Intervention;  Surgeon: [name], MD;  Location: [place] [facility];  Service: Cardiovascular;  Laterality: N/A;   CHOLECYSTECTOMY N/A 02/27/2021   Procedure: LAPAROSCOPIC CHOLECYSTECTOMY;  Surgeon: Erroll Luna, MD;  Location: [facility];  Service: General;  Laterality: N/A;   CORONARY STENT PLACEMENT     INGUINAL HERNIA REPAIR Left 09/16/2013   Procedure: HERNIA REPAIR INGUINAL ADULT;  Surgeon: [name], MD;  Location: [place];  Service: General;  Laterality: Left;   INGUINAL HERNIA REPAIR Left 09/16/2013   Dr [name]   MOUTH SURGERY     RIGHT/LEFT HEART CATH AND CORONARY ANGIOGRAPHY N/A 08/01/2022   Procedure: RIGHT/LEFT HEART CATH AND CORONARY ANGIOGRAPHY;  Surgeon: Sherren Mocha, MD;  Location: [place] [facility];  Service: Cardiovascular;  Laterality: N/A;   SIGMOIDECTOMY  10/28/2009   Dr [name].  Diveetriculitis w prior abscess   TONSILLECTOMY     WISDOM TOOTH EXTRACTION      Social History   Socioeconomic History   Marital status: Divorced    Spouse name: Not on file   Number of children: 2   Years of education: Not on file   Highest education level: Not on file  Occupational History   Occupation: Glass blower/designer  Tobacco Use   Smoking status: Every Day    Packs/day: 0.75    Years: 40.00    Total pack years: 30.00    Types: Cigarettes   Smokeless tobacco: Never   Tobacco comments:    Pt given handout to quit smoking  Vaping Use   Vaping Use: Never used  Substance and Sexual Activity   Alcohol use: Not Currently    Alcohol/week: 5.0 standard drinks of alcohol    Types: 5 Shots of liquor per week    Comment: daily   Drug use: No   Sexual activity: Not on  file  Other Topics Concern   Not on file  Social History Narrative   Not on file   Social Determinants of Health   Financial Resource Strain: Not on file  Food Insecurity: Not on file  Transportation Needs: Not on file  Physical Activity: Not on file  Stress: Not on file  Social Connections: Not on file  Intimate Partner Violence: Not on file    Family History  Problem Relation Age of Onset   Lung cancer Mother    Heart disease Father        has pacemaker   Breast cancer Sister    Colon cancer Neg Hx    Colon polyps Neg Hx    Esophageal cancer Neg Hx    Kidney disease Neg Hx    Diabetes Neg Hx    Stomach cancer Neg Hx    Rectal cancer Neg Hx    Inflammatory bowel disease Neg Hx    Liver disease Neg Hx    Pancreatic cancer Neg Hx     Facility-Administered Medications Prior to Admission  Medication Dose Route Frequency Provider Last Rate Last Admin   sodium chloride flush (NS) 0.9 % injection 3 mL  3 mL Intravenous Q12H Marylu Lund., NP       Medications Prior to Admission  Medication Sig Dispense Refill Last Dose   albuterol (VENTOLIN HFA) 108 (90 Base) MCG/ACT inhaler TAKE 2 PUFFS BY MOUTH EVERY 6 HOURS AS NEEDED FOR WHEEZE OR SHORTNESS OF BREATH 8.5 each 1 Past Week   aspirin EC 81 MG EC tablet Take 1 tablet (81 mg total) by mouth daily.   09/26/2022   atorvastatin (LIPITOR) 40 MG tablet TAKE 1 TABLET BY MOUTH DAILY AT 6 PM. 90 tablet 1 Past Week   clopidogrel (PLAVIX) 75 MG tablet TAKE 1 TABLET BY MOUTH EVERY DAY 90 tablet 3 09/26/2022   Cyanocobalamin (VITAMIN B-12) 2500 MCG SUBL Take 2,500 mcg by mouth every evening.   Past Week   dapagliflozin propanediol (FARXIGA) 10 MG TABS tablet Take 1 tablet (10 mg total) by mouth daily before breakfast. 30 tablet 11 10/10/6220   folic acid (FOLVITE) 1 MG tablet Take 1 tablet (1 mg total) by mouth daily.   Past Week   isosorbide mononitrate (IMDUR) 60 MG 24 hr tablet Take 1 tablet (60 mg total) by mouth daily. 90 tablet 3  10/03/2022   lansoprazole (PREVACID) 30 MG capsule TAKE 1 CAPSULE BY MOUTH DAILY BEFORE BREAKFAST. 90 capsule 2 10/03/2022   metoprolol succinate (TOPROL-XL) 25 MG 24 hr tablet Take 1 tablet (25 mg total) by mouth 2 (two) times daily. Take 1 tablet by mouth in the morning and 1/2 tablet by mouth every evening (Patient taking differently: Take 12.5-25 mg by mouth See admin instructions. Take 1 tablet (25 mg) by mouth in the morning & take 0.5 tablet (12.5 mg) by mouth in the evening.) 180 tablet 3 10/03/2022 at 2100   Multiple Vitamin (MULTIVITAMIN WITH MINERALS) TABS tablet Take 1 tablet by mouth every evening.   Past Week   sacubitril-valsartan (ENTRESTO) 24-26 MG Take 1 tablet by mouth 2 (two) times daily. 60 tablet 1 10/03/2022   spironolactone (ALDACTONE) 25 MG tablet Take 0.5 tablets (12.5 mg total) by mouth daily. (Patient taking differently: Take 12.5 mg by mouth every evening.) 30 tablet 3    thiamine 100 MG tablet Take 1 tablet (100 mg total) by mouth daily. (Patient taking differently: Take 100 mg by mouth every evening.)      nitroGLYCERIN (NITROSTAT) 0.4 MG SL tablet PLACE 1 TABLET UNDER THE TONGUE EVERY 5 MINUTES FOR 3 DOSES AS NEEDED FOR CHEST PAIN 25 tablet 5 Unknown    Current Facility-Administered Medications  Medication Dose Route Frequency Provider Last Rate Last Admin   bupivacaine liposome (EXPAREL) 1.3 % injection 266 mg  20 mL Infiltration Once Michael Boston, MD       cefTRIAXone (ROCEPHIN) 2 g in sodium chloride 0.9 % 100 mL IVPB  2 g Intravenous On Call to OR Michael Boston, MD       Chlorhexidine Gluconate Cloth 2 % PADS 6 each  6 each Topical Once Michael Boston, MD       And   Chlorhexidine Gluconate Cloth 2 % PADS 6 each  6 each Topical Once Michael Boston, MD       Derrill Memo ON 10/05/2022] feeding supplement (ENSURE PRE-SURGERY) liquid 296 mL  296 mL Oral Once Michael Boston, MD       lactated ringers infusion   Intravenous Continuous Myrtie Soman, MD 10 mL/hr at 10/04/22 0643 New Bag  at 10/04/22 0643   Facility-Administered Medications Ordered in Other Encounters  Medication Dose Route Frequency Provider Last  Rate Last Admin   regadenoson (LEXISCAN) injection SOLN 0.4 mg  0.4 mg Intravenous Once Jerline Pain, MD         Allergies  Allergen Reactions   Morphine And Related Other (See Comments)    dont remember the symptoms. Has tolerated dilaudid and oxycodone    BP 115/87   Pulse 91   Temp 97.9 F (36.6 C) (Oral)   Resp 18   Ht '5\' 10"'$  (1.778 m)   Wt 70.8 kg   SpO2 96%   BMI 22.38 kg/m   Labs: No results found for this or any previous visit (from the past 48 hour(s)).  Imaging / Studies: No results found.   Adin Hector, M.D., F.A.C.S. Gastrointestinal and Minimally Invasive Surgery [facility] [address] [place], [place] [address] [phone] Main / Paging  10/04/2022 7:24 AM    Adin Hector

## 2022-10-04 NOTE — Transfer of Care (Signed)
Immediate Anesthesia Transfer of Care Note  Patient: Devonne Lalani  Procedure(s) Performed: Procedure(s) with comments: ANORECTAL EXAM UNDER ANESTHESIA WITH HEMORRHOIDECTOMY WITH LIGATION AND HEMORRHOIDOPEXY (N/A) - GEN AND LOCAL  Patient Location: PACU  Anesthesia Type:General  Level of Consciousness: Patient easily awoken, sedated, comfortable, cooperative, following commands, responds to stimulation.   Airway & Oxygen Therapy: Patient spontaneously breathing, ventilating well, oxygen via simple oxygen mask.  Post-op Assessment: Report given to PACU RN, vital signs reviewed and stable, moving all extremities.   Post vital signs: Reviewed and stable.  Complications: No apparent anesthesia complications Last Vitals:  Vitals Value Taken Time  BP 135/97 10/04/22 0953  Temp    Pulse 96 10/04/22 0956  Resp 19 10/04/22 0956  SpO2 100 % 10/04/22 0956  Vitals shown include unvalidated device data.  Last Pain:  Vitals:   10/04/22 0649  TempSrc: Oral  PainSc:       Patients Stated Pain Goal: 4 (08/17/51 0802)  Complications: No notable events documented.

## 2022-10-05 ENCOUNTER — Other Ambulatory Visit: Payer: Self-pay | Admitting: Nurse Practitioner

## 2022-10-05 ENCOUNTER — Encounter (HOSPITAL_COMMUNITY): Payer: Self-pay | Admitting: Surgery

## 2022-10-05 LAB — SURGICAL PATHOLOGY

## 2022-10-06 ENCOUNTER — Telehealth: Payer: Self-pay | Admitting: Cardiology

## 2022-10-06 MED ORDER — CLOPIDOGREL BISULFATE 75 MG PO TABS: 75.0000 mg | ORAL_TABLET | Freq: Every day | ORAL | 3 refills | Status: DC

## 2022-10-06 NOTE — Telephone Encounter (Signed)
Plavix refill sent to new pharmacy per patient request.

## 2022-10-06 NOTE — Telephone Encounter (Signed)
*  STAT* If patient is at the pharmacy, call can be transferred to refill team.   1. Which medications need to be refilled? (please list name of each medication and dose if known) new prescription for Clopidogrel- changing pharmacy  2. Which pharmacy/location (including street and city if local pharmacy) is medication to be sent to?[facility], [place]   3. Do they need a 30 day or 90 day supply? 90 days and refills

## 2022-10-11 ENCOUNTER — Encounter (HOSPITAL_COMMUNITY): Payer: Self-pay | Admitting: Gastroenterology

## 2022-10-11 ENCOUNTER — Ambulatory Visit (HOSPITAL_COMMUNITY)
Admission: RE | Admit: 2022-10-11 | Discharge: 2022-10-11 | Disposition: A | Discharge status: Home / Self Care | Payer: 59 | Source: Ambulatory Visit | Attending: Gastroenterology | Admitting: Gastroenterology

## 2022-10-11 ENCOUNTER — Encounter (HOSPITAL_COMMUNITY)
Admission: RE | Disposition: A | Discharge status: Home / Self Care | Payer: Self-pay | Source: Ambulatory Visit | Attending: Gastroenterology

## 2022-10-11 DIAGNOSIS — R0789 Other chest pain: Secondary | ICD-10-CM

## 2022-10-11 DIAGNOSIS — R1319 Other dysphagia: Secondary | ICD-10-CM | POA: Insufficient documentation

## 2022-10-11 DIAGNOSIS — R1310 Dysphagia, unspecified: Secondary | ICD-10-CM

## 2022-10-11 HISTORY — PX: ESOPHAGEAL MANOMETRY: SHX5429

## 2022-10-11 SURGERY — MANOMETRY, ESOPHAGUS

## 2022-10-11 MED ORDER — LIDOCAINE VISCOUS HCL 2 % MT SOLN
OROMUCOSAL | Status: AC
  Filled 2022-10-11: qty 15

## 2022-10-11 SURGICAL SUPPLY — 2 items
FACESHIELD LNG OPTICON STERILE (SAFETY) IMPLANT
GLOVE BIO SURGEON STRL SZ8 (GLOVE) ×4 IMPLANT

## 2022-10-11 NOTE — Progress Notes (Signed)
Esophageal manometry performed per protocol without complications.  Patient tolerated well. 

## 2022-10-13 ENCOUNTER — Encounter (HOSPITAL_COMMUNITY): Payer: Self-pay | Admitting: Gastroenterology

## 2022-10-24 ENCOUNTER — Other Ambulatory Visit: Payer: Self-pay

## 2022-10-24 ENCOUNTER — Telehealth: Payer: Self-pay | Admitting: Gastroenterology

## 2022-10-24 ENCOUNTER — Telehealth: Payer: Self-pay

## 2022-10-24 DIAGNOSIS — R1319 Other dysphagia: Secondary | ICD-10-CM

## 2022-10-24 NOTE — Telephone Encounter (Signed)
   Name: Jeremiah Short  DOB: 1961/04/14  MRN: [id number]  Primary Cardiologist: Candee Furbish, MD  Chart reviewed as part of pre-operative protocol coverage. The patient has an upcoming visit scheduled with Ambrose Pancoast, NP on 10/26/2022 at which time clearance can be addressed in case there are any issues that would impact surgical recommendations.  Endoscopy is not scheduled until 12/21/2022 as below. I added preop FYI to appointment note so that provider is aware to address at time of outpatient visit.  Per office protocol the cardiology provider should forward their finalized clearance decision and recommendations regarding antiplatelet therapy to the requesting party below.    I will route this message as FYI to requesting party and remove this message from the preop box as separate preop APP input not needed at this time.   Please call with any questions.  Lenna Sciara, NP  10/24/2022, 4:29 PM

## 2022-10-24 NOTE — Telephone Encounter (Signed)
Inbound call from patient requesting a call back to discuss why he was scheduled for another EGD. Please advise.

## 2022-10-24 NOTE — Telephone Encounter (Signed)
See alternate note  

## 2022-10-24 NOTE — Telephone Encounter (Signed)
[facility] Pre-operative Risk Assessment     Request for surgical clearance:     Endoscopy Procedure  What type of surgery is being performed?     EUS  When is this surgery scheduled?    12/21/22  What type of clearance is required ?   Pharmacy  Are there any medications that need to be held prior to surgery and how long? Plavix  Practice name and name of physician performing surgery?      [facility]  What is your office phone and fax number?      Phone- [phone]  [phone]  Anesthesia type (None, local, MAC, general) ?       MAC

## 2022-10-25 NOTE — Progress Notes (Unsigned)
Office Visit    Patient Name: Jeremiah Short Date of Encounter: 10/26/2022  Primary Care Provider:  Luetta Nutting, DO Primary Cardiologist:  Candee Furbish, MD Primary Electrophysiologist: None  Chief Complaint    Jeremiah Short is a 62 y.o. male with PMH of h/o CAD s/p DES-LAD in 2009 and 2017, chronic combined systolic and diastolic heart failure, aortic atherosclerosis, hyperlipidemia, emphysema, barrett's esophagus, and tobacco abuse who presents today for 21-monthfollow-up and surgical clearance for upcoming endoscopy procedure.  Past Medical History    Past Medical History:  Diagnosis Date   Anxiety    Aortic atherosclerosis ([facility] 02/24/2021   Barrett's esophagus    Cardiomyopathy    Echocardiogram 10/23: EF 25-30, normal RVSF, mild MR, mild dilation of aortic root 41 mm, mild dilation of asc aorta 41 mm, RAP 8   Closed compression fracture of body of L1 vertebra ([facility] 02/24/2021   Coronary artery disease    Diverticulosis    Emphysema lung ([facility] 02/24/2021   Esophageal stricture    ETOH abuse    GERD (gastroesophageal reflux disease)    Hiatal hernia    History of ETOH abuse    Hypertension    Major depressive disorder    Myocardial infarction ([facility] 2009   Pure hypercholesterolemia    Smoker    Stented coronary artery    Tubular adenoma of colon 2012   Wears glasses    Past Surgical History:  Procedure Laterality Date   APPENDECTOMY  10/02/2009   during colectomy   CARDIAC CATHETERIZATION  10/03/2007   placed 2 stents   CARDIAC CATHETERIZATION N/A 09/27/2016   Procedure: Left Heart Cath and Coronary Angiography;  Surgeon: JAdrian Prows MD;  [facility] [facility];  Service: Cardiovascular;  Laterality: N/A;   CARDIAC CATHETERIZATION N/A 09/27/2016   Procedure: Coronary Stent Intervention;  Surgeon: JAdrian Prows MD;  [place] [facility];  Service: Cardiovascular;  Laterality: N/A;   CHOLECYSTECTOMY N/A 02/27/2021   Procedure: LAPAROSCOPIC  CHOLECYSTECTOMY;  Surgeon: CErroll Luna MD;  Location: [facility];  Service: General;  Laterality: N/A;   CORONARY STENT PLACEMENT     ESOPHAGEAL MANOMETRY N/A 10/11/2022   Procedure: ESOPHAGEAL MANOMETRY (EM);  Surgeon: MIrving Copas, MD;  Location: [facility];  Service: Gastroenterology;  Laterality: N/A;   EVALUATION UNDER ANESTHESIA WITH HEMORRHOIDECTOMY N/A 10/04/2022   Procedure: ANORECTAL EXAM UNDER ANESTHESIA WITH HEMORRHOIDECTOMY WITH LIGATION AND HEMORRHOIDOPEXY;  Surgeon: GMichael Boston MD;  Location: [facility];  Service: General;  Laterality: N/A;  GEN AND LOCAL   INGUINAL HERNIA REPAIR Left 09/16/2013   Procedure: HERNIA REPAIR INGUINAL ADULT;  Surgeon: TJoyice Faster Cornett, MD;  [place] [facility]  Service: General;  Laterality: Left;   INGUINAL HERNIA REPAIR Left 09/16/2013   Dr CBrantley Stage  MOUTH SURGERY     RIGHT/LEFT HEART CATH AND CORONARY ANGIOGRAPHY N/A 08/01/2022   Procedure: RIGHT/LEFT HEART CATH AND CORONARY ANGIOGRAPHY;  Surgeon: CSherren Mocha MD;  [place];  Service: Cardiovascular;  Laterality: N/A;   SIGMOIDECTOMY  10/28/2009   Dr WRise Patience  Diveetriculitis w prior abscess   TONSILLECTOMY     WISDOM TOOTH EXTRACTION      Allergies  Allergies  Allergen Reactions   Morphine And Related Other (See Comments)    dont remember the symptoms. Has tolerated dilaudid and oxycodone    History of Present Illness    MDonye Short is a 62year old male with the above mention past medical  history who presents today to schedule left heart catheterization due to reduced LV function on recent 2D echo.  Jeremiah Short has been followed by Jeremiah Short since 2016 for management of coronary artery disease and hypertension.  He has a history of PCI with LHC performed for STEMI in 01/2008 treated with DES to LAD.  He underwent subsequent LHC in 2017 for anterior MI with mLAD occlusion just proximal to the previously placed LAD stent. He  underwent sucssesful PCI with DES. EF was 40% at cath.  He was placed on indefinite DAPT with Plavix and ASA.  He continued to do well from cardiac standpoint until he presented to the ED on 2/23 with complaint of severe chest pain. EKG was unremarkable, chest x-ray was unremarkable, troponin was negative.  He has not had any recurrent symptoms since.  He was cleared for upper endoscopy in 04/2022.  He presented to the clinic on 07/05/2022 for preop clearance of hemorrhoidectomy.  Patient endorses chest discomfort when eating and underwent exercise Myoview and echo to further evaluate.  Myoview results showed no ischemia and reduced heart function with high risk study.  2D echo was completed as well and showed depressed LV function with EF of 25-30% changed from 2D echo in 2018 that showed EF of 45-50%.  There was also mild mitral regurgitation noted and mild dilation of aortic root.  Results were reviewed with Jeremiah Short who felt patient would benefit from undergoing cardiac catheterization due to significant reduction in EF.  He was seen 07/26/2022 for surgical clearance and R/left heart catheterization due to decreased EF on recent 2D echo.  LHC completed showing widely patent left main with patent LAD stent with mild in-stent restenosis but no high-grade obstruction.  Right heart pressures were low to normal and medical therapy indicated.  He was seen on 08/22/2022 following recent LHC.  During visit patient was euvolemic on exam and Entresto 24/26 mg was added along with spironolactone 12.5 mg daily. He was referred to [facility] for titration management of CHF medications.  He was also advised to moderate his alcohol consumption and advised to follow-up with PCP regarding support for withdrawal and benzodiazepine assistance.  Jeremiah Short presents today for preoperative clearance and follow-up of CHF.  Since last being seen in the office patient reports he has been doing well and is currently recovering from  hemorrhoid surgery.  His blood pressure today is well-controlled at 98/78 and heart rate was 86 bpm.  He reports compliance with his current medication regimen and had some confusions regarding his metoprolol dose that was clarified during our visit.  He reports some fatigue but no shortness of breath.  He reports that he is recovering from his surgery and does note some decreased energy.  He is currently drinking beer and has not consumed any vodka or hard liquor.  He is euvolemic on on exam today and his weight is down 4 pounds.  He was recently referred to Pharm.D. for titration of his CHF medication regimen.  Patient denies chest pain, palpitations, dyspnea, PND, orthopnea, nausea, vomiting, dizziness, syncope, edema, weight gain, or early satiety.   Home Medications    Current Outpatient Medications  Medication Sig Dispense Refill   albuterol (VENTOLIN HFA) 108 (90 Base) MCG/ACT inhaler TAKE 2 PUFFS BY MOUTH EVERY 6 HOURS AS NEEDED FOR WHEEZE OR SHORTNESS OF BREATH 8.5 each 1   aspirin EC 81 MG EC tablet Take 1 tablet (81 mg total) by mouth daily.     atorvastatin (  LIPITOR) 40 MG tablet TAKE 1 TABLET BY MOUTH DAILY AT 6 PM. 90 tablet 1   clopidogrel (PLAVIX) 75 MG tablet Take 1 tablet (75 mg total) by mouth daily. 90 tablet 3   Cyanocobalamin (VITAMIN B-12) 2500 MCG SUBL Take 2,500 mcg by mouth every evening.     dapagliflozin propanediol (FARXIGA) 10 MG TABS tablet Take 1 tablet (10 mg total) by mouth daily before breakfast. 30 tablet 11   folic acid (FOLVITE) 1 MG tablet Take 1 tablet (1 mg total) by mouth daily.     isosorbide mononitrate (IMDUR) 60 MG 24 hr tablet Take 1 tablet (60 mg total) by mouth daily. 90 tablet 3   lansoprazole (PREVACID) 30 MG capsule TAKE 1 CAPSULE BY MOUTH DAILY BEFORE BREAKFAST. 90 capsule 2   Multiple Vitamin (MULTIVITAMIN WITH MINERALS) TABS tablet Take 1 tablet by mouth every evening.     nitroGLYCERIN (NITROSTAT) 0.4 MG SL tablet PLACE 1 TABLET UNDER THE  TONGUE EVERY 5 MINUTES FOR 3 DOSES AS NEEDED FOR CHEST PAIN 25 tablet 5   sacubitril-valsartan (ENTRESTO) 24-26 MG TAKE 1 TABLET BY MOUTH TWICE A DAY 90 tablet 3   spironolactone (ALDACTONE) 25 MG tablet Take 0.5 tablets (12.5 mg total) by mouth daily. (Patient taking differently: Take 12.5 mg by mouth every evening.) 30 tablet 3   thiamine 100 MG tablet Take 1 tablet (100 mg total) by mouth daily. (Patient taking differently: Take 100 mg by mouth every evening.)     venlafaxine XR (EFFEXOR-XR) 75 MG 24 hr capsule Take 75 mg by mouth daily with breakfast.     metoprolol succinate (TOPROL-XL) 25 MG 24 hr tablet Take 1 tablet (25 mg total) by mouth daily. 30 tablet 1   Current Facility-Administered Medications  Medication Dose Route Frequency Provider Last Rate Last Admin   sodium chloride flush (NS) 0.9 % injection 3 mL  3 mL Intravenous Q12H Marylu Lund., NP       Facility-Administered Medications Ordered in Other Visits  Medication Dose Route Frequency Provider Last Rate Last Admin   regadenoson (LEXISCAN) injection SOLN 0.4 mg  0.4 mg Intravenous Once Jerline Pain, MD         Review of Systems  Please see the history of present illness.    (+) Fatigue (+) Shortness of breath with heavy exertion  All other systems reviewed and are otherwise negative except as noted above.  Physical Exam    Wt Readings from Last 3 Encounters:  10/26/22 152 lb (68.9 kg)  10/04/22 156 lb (70.8 kg)  09/27/22 156 lb (70.8 kg)   VS: Vitals:   10/26/22 1021  BP: 98/78  Pulse: 86  SpO2: 96%  ,Body mass index is 21.81 kg/m.  Constitutional:      Appearance: Healthy appearance. Not in distress.  Neck:     Vascular: JVD normal.  Pulmonary:     Effort: Pulmonary effort is normal.     Breath sounds: No wheezing. No rales. Diminished in the bases Cardiovascular:     Normal rate. Regular rhythm. Normal S1. Normal S2.      Murmurs: There is no murmur.  Edema:    Peripheral edema absent.   Abdominal:     Palpations: Abdomen is soft non tender. There is no hepatomegaly.  Skin:    General: Skin is warm and dry.  Neurological:     General: No focal deficit present.     Mental Status: Alert and oriented to person, place and time.  Cranial Nerves: Cranial nerves are intact.  EKG/LABS/Other Studies Reviewed    ECG personally reviewed by me today -sinus rhythm with no acute changes and rate of 86 bpm consistent with previous EKG.   Lab Results  Component Value Date   WBC 5.6 09/27/2022   HGB 13.1 09/27/2022   HCT 39.3 09/27/2022   MCV 102.3 (H) 09/27/2022   PLT 169 09/27/2022   Lab Results  Component Value Date   CREATININE 0.84 09/04/2022   BUN 19 09/04/2022   NA 140 09/04/2022   K 5.1 09/04/2022   CL 99 09/04/2022   CO2 21 09/04/2022   Lab Results  Component Value Date   ALT 21 08/15/2022   AST 29 08/15/2022   ALKPHOS 47 03/01/2021   BILITOT 0.4 08/15/2022   Lab Results  Component Value Date   CHOL 157 02/09/2022   HDL 76 02/09/2022   LDLCALC 67 02/09/2022   LDLDIRECT 15.0 05/06/2018   TRIG 68 02/09/2022   CHOLHDL 2.1 02/09/2022    Lab Results  Component Value Date   HGBA1C 5.4 02/09/2022    Assessment & Plan    1.  Chronic combined systolic and diastolic CHF: -2D echo completed 10/18 with EF of 25-30% changed from 2D echo in 2018 that showed EF of 45-50%.  -R/LHC was completed and revealed normal coronaries with reduced EF of 25-30% -Patient is euvolemic on exam today -He was recently seen by Pharm.D. and is currently on optimize GDMT with Farxiga 10 mg, Toprol-XL 25 mg daily, Entresto 24/26 mg, and Aldactone 25 mg daily Low sodium diet, fluid restriction <2L, and daily weights encouraged. Educated to contact our office for weight gain of 2 lbs overnight or 5 lbs in one week.    2.  Coronary artery disease: -s/p inferior STEMI with DES-LAD in 2009 and 2017 with diagonal 2 jailed treated with angioplasty and recent Myoview with no ischemia  but high risk. -Today patient reports no chest pain or anginal equivalent  -Continue GDMT with Plavix and ASA 81 mg indefinitely, Lipitor 40 mg, and Toprol 25 mg daily, Imdur 60 mg daily   3.  Hyperlipidemia: LDL was 67 at goal of less than 70 on 01/2022 -Continue atorvastatin 40 mg daily   4.  Hypertension: -Patient's blood pressure today was elevated at 98/78 -Continue Toprol, spironolactone and Entresto   5.  History of EtOH abuse: -Patient counseled about adverse reactions with alcohol and new heart failure medications.  He was advised to moderate his alcohol consumption and follow-up with his PCP regarding next steps in managing withdrawal with benzodiazepine assistance.   6.  Preop clearance: Mr. Minerd perioperative risk of a major cardiac event is 6.6% according to the Revised Cardiac Risk Index (RCRI).  Therefore, he is at  increased risk for perioperative complications.   His functional capacity is fair at 4.31 METs according to the Duke Activity Status Index (DASI). Recommendations: According to ACC/AHA guidelines, no further cardiovascular testing needed.  The patient may proceed to surgery at acceptable risk.   Antiplatelet and/or Anticoagulation Recommendations: Aspirin can be held for 5 days prior to his surgery and clopidogrel can also be held for 5 days plan to please resume Aspirin clopidogrel post operatively when it is felt to be safe from a bleeding standpoint.   Disposition: Follow-up with Candee Furbish, MD or APP in 3 months   Medication Adjustments/Labs and Tests Ordered: Current medicines are reviewed at length with the patient today.  Concerns regarding medicines are outlined above.  Signed, Mable Fill, Marissa Nestle, NP 10/26/2022, 12:20 PM [facility]  Note:  This document was prepared using Dragon voice recognition software and may include unintentional dictation errors.

## 2022-10-26 ENCOUNTER — Ambulatory Visit: Payer: 59 | Attending: Nurse Practitioner | Admitting: Nurse Practitioner

## 2022-10-26 ENCOUNTER — Encounter: Payer: Self-pay | Admitting: Nurse Practitioner

## 2022-10-26 VITALS — BP 98/78 | HR 86 | Ht 70.0 in | Wt 152.0 lb

## 2022-10-26 DIAGNOSIS — I10 Essential (primary) hypertension: Secondary | ICD-10-CM

## 2022-10-26 DIAGNOSIS — I25119 Atherosclerotic heart disease of native coronary artery with unspecified angina pectoris: Secondary | ICD-10-CM | POA: Diagnosis not present

## 2022-10-26 DIAGNOSIS — Z01810 Encounter for preprocedural cardiovascular examination: Secondary | ICD-10-CM

## 2022-10-26 DIAGNOSIS — F1090 Alcohol use, unspecified, uncomplicated: Secondary | ICD-10-CM

## 2022-10-26 DIAGNOSIS — E7800 Pure hypercholesterolemia, unspecified: Secondary | ICD-10-CM

## 2022-10-26 DIAGNOSIS — I5042 Chronic combined systolic (congestive) and diastolic (congestive) heart failure: Secondary | ICD-10-CM

## 2022-10-26 LAB — BASIC METABOLIC PANEL
BUN/Creatinine Ratio: 22 (ref 10–24)
BUN: 19 mg/dL (ref 8–27)
CO2: 23 mmol/L (ref 20–29)
Calcium: 9.3 mg/dL (ref 8.6–10.2)
Chloride: 97 mmol/L (ref 96–106)
Creatinine, Ser: 0.88 mg/dL (ref 0.76–1.27)
Glucose: 110 mg/dL — ABNORMAL HIGH (ref 70–99)
Potassium: 4.6 mmol/L (ref 3.5–5.2)
Sodium: 135 mmol/L (ref 134–144)
eGFR: 97 mL/min/{1.73_m2} (ref >59)

## 2022-10-26 MED ORDER — METOPROLOL SUCCINATE ER 25 MG PO TB24: 25.0000 mg | ORAL_TABLET | Freq: Every day | ORAL | 1 refills | Status: DC

## 2022-10-26 NOTE — Patient Instructions (Addendum)
Medication Instructions:  STOP Losartan  CHANGE Metoprolol to '25mg'$  take 1 tablet once a day  *If you need a refill on your cardiac medications before your next appointment, please call your pharmacy*   Lab Work: TODAY-BMET If you have labs (blood work) drawn today and your tests are completely normal, you will receive your results only by: Yoe (if you have MyChart) OR A paper copy in the mail If you have any lab test that is abnormal or we need to change your treatment, we will call you to review the results.   Testing/Procedures: NONE ORDERED   Follow-Up: At [facility], you and your health needs are our priority.  As part of our continuing mission to provide you with exceptional heart care, we have created designated Provider Care Teams.  These Care Teams include your primary Cardiologist (physician) and Advanced Practice Providers (APPs -  Physician Assistants and Nurse Practitioners) who all work together to provide you with the care you need, when you need it.  We recommend signing up for the patient portal called "MyChart".  Sign up information is provided on this After Visit Summary.  MyChart is used to connect with patients for Virtual Visits (Telemedicine).  Patients are able to view lab/test results, encounter notes, upcoming appointments, etc.  Non-urgent messages can be sent to your provider as well.   To learn more about what you can do with MyChart, go to [email or web address].    Your next appointment:   3 month(s)  Provider:   Candee Furbish, MD     Other Instructions

## 2022-10-30 ENCOUNTER — Other Ambulatory Visit: Payer: Self-pay | Admitting: Cardiology

## 2022-10-30 DIAGNOSIS — I2510 Atherosclerotic heart disease of native coronary artery without angina pectoris: Secondary | ICD-10-CM

## 2022-10-30 DIAGNOSIS — R0789 Other chest pain: Secondary | ICD-10-CM

## 2022-11-08 DIAGNOSIS — R0789 Other chest pain: Secondary | ICD-10-CM

## 2022-12-04 ENCOUNTER — Telehealth: Payer: Self-pay

## 2022-12-04 NOTE — Telephone Encounter (Signed)
-----   Message from Timothy Lasso, RN sent at 10/24/2022  1:47 PM EST ----- Make sure to have plavix hold

## 2022-12-04 NOTE — Telephone Encounter (Signed)
The pt has been advised see office note below from cardiology   Antiplatelet and/or Anticoagulation Recommendations: Aspirin can be held for 5 days prior to his surgery and clopidogrel can also be held for 5 days plan to please resume Aspirin clopidogrel post operatively when it is felt to be safe from a bleeding standpoint.    Disposition: Follow-up with Candee Furbish, MD or APP in 3 months

## 2022-12-13 ENCOUNTER — Encounter (HOSPITAL_COMMUNITY): Payer: Self-pay | Admitting: Gastroenterology

## 2022-12-13 ENCOUNTER — Other Ambulatory Visit: Payer: Self-pay

## 2022-12-19 ENCOUNTER — Ambulatory Visit (INDEPENDENT_AMBULATORY_CARE_PROVIDER_SITE_OTHER): Payer: 59 | Admitting: Gastroenterology

## 2022-12-19 ENCOUNTER — Encounter: Payer: Self-pay | Admitting: Gastroenterology

## 2022-12-19 VITALS — BP 102/70 | HR 67 | Ht 70.0 in | Wt 156.0 lb

## 2022-12-19 DIAGNOSIS — K219 Gastro-esophageal reflux disease without esophagitis: Secondary | ICD-10-CM

## 2022-12-19 DIAGNOSIS — R933 Abnormal findings on diagnostic imaging of other parts of digestive tract: Secondary | ICD-10-CM

## 2022-12-19 DIAGNOSIS — K222 Esophageal obstruction: Secondary | ICD-10-CM | POA: Diagnosis not present

## 2022-12-19 DIAGNOSIS — R1319 Other dysphagia: Secondary | ICD-10-CM | POA: Diagnosis not present

## 2022-12-19 DIAGNOSIS — Q396 Congenital diverticulum of esophagus: Secondary | ICD-10-CM

## 2022-12-19 NOTE — H&P (View-Only) (Signed)
[facility] VISIT   Primary Care Provider Luetta Nutting, [place] [address] [phone]   Patient Profile: Jeremiah Short is a 62 y.o. male with a pmh significant for CAD (on Plavix), COPD/emphysema, MDD, hyperlipidemia, hypertension, diverticulosis, colon polyps (TA's), Barrett's esophagus, hiatal hernia, GERD, EGJOO v developing Achalasia, hemorrhoids (internal/external).  The patient presents to the [facility] for an evaluation and management of problem(s) noted below:  Problem List 1. Esophageal dysphagia   2. Esophagogastric junction outflow obstruction   3. Esophageal diverticulum   4. Abnormal endoscopy of upper gastrointestinal tract   5. Gastroesophageal reflux disease without esophagitis   6. Esophageal stricture      History of Present Illness Please see prior GI notes for full details of HPI.  Interval History The patient returns for follow-up with his sister.  Overall, the patient is continuing to have difficulty with solid foods.  Even eggs are causing him to have issues.  His weight has been stable.  His workup has shown he does have esophageal narrowing/stricturing which we have dilated him up to 16 mm but is manometry shows evidence of EGJOO first developing achalasia.  He is scheduled for EUS later this week for repeat evaluation of the distal esophagus as well as potential repeat dilation and Botox.  Patient has been trying to modify his diet slowly to minimize issues that he is chewing very thoroughly.  He is having to regurgitate food at times.  He continues to take his acid medications as prescribed.  GI Review of Systems Positive as above Negative for odynophagia, early satiety, anorexia, pain, melena, hematochezia, change in bowel habits  Review of Systems General: Denies fevers/chills/weight loss unintentionally Cardiovascular: Denies chest pain Pulmonary: Denies  shortness of breath Gastroenterological: See HPI Genitourinary: Denies darkened urine  Hematological: Positive for history of easy bruising/bleeding due to Plavix Dermatological: Denies jaundice Psychological: Mood is stable   Medications Current Outpatient Medications  Medication Sig Dispense Refill   albuterol (VENTOLIN HFA) 108 (90 Base) MCG/ACT inhaler TAKE 2 PUFFS BY MOUTH EVERY 6 HOURS AS NEEDED FOR WHEEZE OR SHORTNESS OF BREATH 8.5 each 1   aspirin EC 81 MG EC tablet Take 1 tablet (81 mg total) by mouth daily.     atorvastatin (LIPITOR) 40 MG tablet TAKE 1 TABLET BY MOUTH DAILY AT 6 PM. 90 tablet 1   clopidogrel (PLAVIX) 75 MG tablet Take 1 tablet (75 mg total) by mouth daily. 90 tablet 3   Cyanocobalamin (VITAMIN B-12) 2500 MCG SUBL Take 2,500 mcg by mouth every evening.     dapagliflozin propanediol (FARXIGA) 10 MG TABS tablet Take 1 tablet (10 mg total) by mouth daily before breakfast. 30 tablet 11   isosorbide mononitrate (IMDUR) 60 MG 24 hr tablet TAKE 1 TABLET BY MOUTH EVERY DAY 90 tablet 3   lansoprazole (PREVACID) 30 MG capsule TAKE 1 CAPSULE BY MOUTH DAILY BEFORE BREAKFAST. 90 capsule 2   metoprolol succinate (TOPROL-XL) 25 MG 24 hr tablet Take 1 tablet (25 mg total) by mouth daily. 30 tablet 1   Multiple Vitamin (MULTIVITAMIN WITH MINERALS) TABS tablet Take 1 tablet by mouth every evening.     nitroGLYCERIN (NITROSTAT) 0.4 MG SL tablet PLACE 1 TABLET UNDER THE TONGUE EVERY 5 MINUTES FOR 3 DOSES AS NEEDED FOR CHEST PAIN 25 tablet 5   sacubitril-valsartan (ENTRESTO) 24-26 MG TAKE 1 TABLET BY MOUTH TWICE A DAY 90 tablet 3   spironolactone (ALDACTONE) 25 MG  tablet Take 0.5 tablets (12.5 mg total) by mouth daily. (Patient taking differently: Take 12.5 mg by mouth every evening.) 30 tablet 3   thiamine 100 MG tablet Take 1 tablet (100 mg total) by mouth daily. (Patient taking differently: Take 100 mg by mouth every evening.)     Current Facility-Administered Medications   Medication Dose Route Frequency Provider Last Rate Last Admin   sodium chloride flush (NS) 0.9 % injection 3 mL  3 mL Intravenous Q12H Barbarann Ehlers Junius Creamer., NP       Facility-Administered Medications Ordered in Other Visits  Medication Dose Route Frequency Provider Last Rate Last Admin   regadenoson (LEXISCAN) injection SOLN 0.4 mg  0.4 mg Intravenous Once Jerline Pain, MD        Allergies Allergies  Allergen Reactions   Morphine And Related Other (See Comments)    dont remember the symptoms. Has tolerated dilaudid and oxycodone    Histories Past Medical History:  Diagnosis Date   Anxiety    Aortic atherosclerosis ([place]) 02/24/2021   Barrett's esophagus    Cardiomyopathy    Echocardiogram 10/23: EF 25-30, normal RVSF, mild MR, mild dilation of aortic root 41 mm, mild dilation of asc aorta 41 mm, RAP 8   Closed compression fracture of body of L1 vertebra ([place]) 02/24/2021   Coronary artery disease    Diverticulosis    Emphysema lung ([place]) 02/24/2021   Esophageal stricture    GERD (gastroesophageal reflux disease)    Hiatal hernia    History of ETOH abuse    Hypertension    Myocardial infarction [facility]) 2009   Pure hypercholesterolemia    Smoker    Stented coronary artery    Tubular adenoma of colon 2012   Wears glasses    Past Surgical History:  Procedure Laterality Date   APPENDECTOMY  10/02/2009   during colectomy   CARDIAC CATHETERIZATION  10/03/2007   placed 2 stents   CARDIAC CATHETERIZATION N/A 09/27/2016   Procedure: Left Heart Cath and Coronary Angiography;  Surgeon: [name], MD;  Location: [facility];  Service: Cardiovascular;  Laterality: N/A;   CARDIAC CATHETERIZATION N/A 09/27/2016   Procedure: Coronary Stent Intervention;  Surgeon: [name], MD;  Location: [facility];  Service: Cardiovascular;  Laterality: N/A;   CHOLECYSTECTOMY N/A 02/27/2021   Procedure: LAPAROSCOPIC CHOLECYSTECTOMY;  Surgeon: Erroll Luna, MD;  Location: [facility];   Service: General;  Laterality: N/A;   ESOPHAGEAL MANOMETRY N/A 10/11/2022   Procedure: ESOPHAGEAL MANOMETRY (EM);  Surgeon: Irving Copas., MD;  Location: [facility];  Service: Gastroenterology;  Laterality: N/A;   EVALUATION UNDER ANESTHESIA WITH HEMORRHOIDECTOMY N/A 10/04/2022   Procedure: ANORECTAL EXAM UNDER ANESTHESIA WITH HEMORRHOIDECTOMY WITH LIGATION AND HEMORRHOIDOPEXY;  Surgeon: Michael Boston, MD;  Location: [facility];  Service: General;  Laterality: N/A;  GEN AND LOCAL   INGUINAL HERNIA REPAIR Left 09/16/2013   Procedure: HERNIA REPAIR INGUINAL ADULT;  Surgeon: [name], MD;  Location: [facility];  Service: General;  Laterality: Left;   INGUINAL HERNIA REPAIR Left 09/16/2013   Dr [name]   MOUTH SURGERY     RIGHT/LEFT HEART CATH AND CORONARY ANGIOGRAPHY N/A 08/01/2022   Procedure: RIGHT/LEFT HEART CATH AND CORONARY ANGIOGRAPHY;  Surgeon: Sherren Mocha, MD;  Location: [facility];  Service: Cardiovascular;  Laterality: N/A;   SIGMOIDECTOMY  10/28/2009   Dr [name].  Diveetriculitis w prior abscess   TONSILLECTOMY     WISDOM TOOTH EXTRACTION  on file   Number of children: 2   Years of education: Not on file   Highest education level: Not on file  Occupational History   Occupation: Machine Operator  Tobacco Use   Smoking status: Every Day    Packs/day: 0.75    Years: 40.00    Additional pack years: 0.00    Total pack years: 30.00    Types: Cigarettes   Smokeless tobacco: Never   Tobacco comments:    Pt given handout to quit smoking  Vaping Use   Vaping Use: Never used  Substance and Sexual Activity   Alcohol use: Not Currently    Alcohol/week: 5.0 standard drinks of alcohol    Types: 5 Shots of liquor per week    Comment: daily   Drug use: No   Sexual activity: Not on file  Other Topics Concern   Not on file  Social History Narrative   Not on file   Social Determinants of Health   Financial Resource Strain: Not on  file  Food Insecurity: Not on file  Transportation Needs: Not on file  Physical Activity: Not on file  Stress: Not on file  Social Connections: Not on file  Intimate Partner Violence: Not on file   Family History  Problem Relation Age of Onset   Lung cancer Mother    Heart disease Father        has pacemaker   Breast cancer Sister    Colon cancer Neg Hx    Colon polyps Neg Hx    Esophageal cancer Neg Hx    Kidney disease Neg Hx    Diabetes Neg Hx    Stomach cancer Neg Hx    Rectal cancer Neg Hx    Inflammatory bowel disease Neg Hx    Liver disease Neg Hx    Pancreatic cancer Neg Hx    I have reviewed his medical, social, and family history in detail and updated the electronic medical record as necessary.    PHYSICAL EXAMINATION  Ht 5' 10" (1.778 m)   Wt 156 lb (70.8 kg)   BMI 22.38 kg/m  Wt Readings from Last 3 Encounters:  12/19/22 156 lb (70.8 kg)  10/26/22 152 lb (68.9 kg)  10/04/22 156 lb (70.8 kg)  GEN: NAD, appears stated age, doesn't appear chronically ill PSYCH: Cooperative, without pressured speech EYE: Conjunctivae pink, sclerae anicteric ENT: MMM CV: Nontachycardic RESP: No or wheezing GI: NABS, soft, NT/ND, without rebound or guarding MSK/EXT: No lower extremity edema SKIN: No jaundice NEURO:  Alert & Oriented x 3, no focal deficits   REVIEW OF DATA  I reviewed the following data at the time of this encounter:  GI Procedures and Studies  July 2023 EGD - Discolored, texture changed mucosa in the esophagus. Biopsied. - Diverticulum in the distal esophagus. - Esophageal mucosal changes consistent with short-segment Barrett's esophagus. Biopsied. - Z-line irregular, 40 cm from the incisors. - Dilation in the entire esophagus. - 2 cm hiatal hernia. - Gastritis - not clearly portal gastropathy, but there is some potential for development of this. Biopsied. - No gross lesions in the duodenal bulb, in the first portion of the duodenum and in the second  portion of the duodenum.  Pathology Diagnosis 1. Surgical [P], gastric biopsy's - REACTIVE GASTROPATHY. - GASTRIC OXYNTIC GLANDS WITH MILD LUMINAL DILATATION, SUGGESTIVE OF PROTON PUMP INHIBITOR EFFECT. - NEGATIVE FOR AN INFLAMMATORY PATTERN PREDICTIVE OF HELICOBACTER PYLORI INFECTION. - NEGATIVE FOR INTESTINAL METAPLASIA AND MALIGNANCY. 2. Surgical [P], distal   SUGGESTIVE OF PROTON PUMP INHIBITOR EFFECT. - NEGATIVE FOR AN INFLAMMATORY PATTERN PREDICTIVE OF HELICOBACTER PYLORI INFECTION. - NEGATIVE FOR INTESTINAL METAPLASIA AND MALIGNANCY. 2. Surgical [P], distal esophagus - SQUAMOCOLUMNAR MUCOSA WITH LIMITED INTESTINAL METAPLASIA, CONSISTENT WITH BARRETT'S ESOPHAGUS IN THE PROPER CLINICAL CONTEXT. - NEGATIVE FOR DYSPLASIA AND MALIGNANCY. - NEGATIVE FOR INTRASQUAMOUS EOSINOPHILS. 3. Surgical [P], esophageal biopsy's - SQUAMOUS MUCOSA WITH REACTIVE CHANGES ASSOCIATED WITH REFLUX EFFECT (PARAKERATOSIS, BASAL HYPERPLASIA, ELONGATED PAPILLAE). - GMS STAIN NEGATIVE FOR FUNGI, WITH ADEQUATE CONTROL. - SMALL COLUMNAR EPITHELIAL FRAGMENT, WITHOUT IDENTIFIED INTESTINAL METAPLASIA. - NEGATIVE FOR DYSPLASIA AND MALIGNANCY.  Esophageal Manometry   Laboratory Studies  Reviewed those in epic  Imaging Studies  6/23 CT-Chest IMPRESSION: 1. Lung-RADS 2, benign appearance or behavior. Continue annual screening with low-dose chest CT without contrast in 12 months. 2. Unchanged mild circumferential esophageal wall thickening, most pronounced at the lower esophagus, most commonly due to reflux esophagitis. 3. Aortic Atherosclerosis (ICD10-I70.0) and Emphysema (ICD10-J43.9).   ASSESSMENT  Mr. Barada is a 62 y.o. male with a pmh significant for CAD (on Plavix), COPD/emphysema, MDD, hyperlipidemia, hypertension, diverticulosis, colon polyps (TA's), Barrett's esophagus, hiatal hernia, GERD, EGJOO v developing Achalasia, hemorrhoids (internal/external).  The patient is seen today for evaluation and management of:  1. Esophageal dysphagia   2.  Esophagogastric junction outflow obstruction   3. Esophageal diverticulum   4. Abnormal endoscopy of upper gastrointestinal tract   5. Gastroesophageal reflux disease without esophagitis   6. Esophageal stricture    The patient is hemodynamically stable.  Clinically, he continues to experience dysphagia symptoms.  He has evidence of esophagogastric junction outflow obstruction based on his most recent manometry.  He has had CT chest which has not shown overt masses or lesions but we will perform an EUS just to ensure that nothing else is being missed in the distal esophagus/sphincter region.  If nothing is found, then we will plan to do a repeat dilation and hopefully get him up to 17 or 18 mm.  If he does not have mucosal renting, I will try to do Botox to his area.  The risks and benefits of endoscopic evaluation were discussed with the patient; these include but are not limited to the risk of perforation, infection, bleeding, missed lesions, lack of diagnosis, severe illness requiring hospitalization, as well as anesthesia and sedation related illnesses.  The patient and/or family is agreeable to proceed.  The risks and benefits of endoscopic evaluation were discussed with the patient; these include but are not limited to the risk of perforation, infection, bleeding, missed lesions, lack of diagnosis, severe illness requiring hospitalization, as well as anesthesia and sedation related illnesses.  The patient and/or family is agreeable to proceed.   If we perform the procedure and patient continues to have issues postprocedure, then referral for discussion of POEM versus Heller myotomy makes sense.  We did discuss briefly the potential treatment options of these 2 entities if dilatation does not improve his symptoms further.  All patient questions were answered to the best of my ability, and the patient agrees to the aforementioned plan of action with follow-up as indicated.   PLAN  Continue PPI  daily Proceed with scheduled EGD/EUS with possible dilation/Botox this week Continue to hold Plavix If symptoms persist post procedure without evidence of any masslike areas, then patient will need referral to [facility] for POEM with Dr. Leroy Sea or Dr. Harl Bowie versus referral for [facility] myotomy   No orders of the defined types were placed in this encounter.  New Prescriptions   No medications on file   Modified Medications   No medications on file    Planned Follow Up No follow-ups on file.   Total Time in Face-to-Face and in Coordination of Care for patient including independent/personal interpretation/review of prior testing, medical history, examination, medication adjustment, communicating results with the patient directly, and documentation within the EHR is 25 minutes.   Justice Britain, MD [facility] Office # [phone]

## 2022-12-19 NOTE — Patient Instructions (Signed)
You have been scheduled for an endoscopy. Please follow written instructions given to you at your visit today. If you use inhalers (even only as needed), please bring them with you on the day of your procedure.  _______________________________________________________  If your blood pressure at your visit was 140/90 or greater, please contact your primary care physician to follow up on this.  _______________________________________________________  If you are age 49 or older, your body mass index should be between 23-30. Your Body mass index is 22.38 kg/m. If this is out of the aforementioned range listed, please consider follow up with your Primary Care Provider.  If you are age 54 or younger, your body mass index should be between 19-25. Your Body mass index is 22.38 kg/m. If this is out of the aformentioned range listed, please consider follow up with your Primary Care Provider.   ________________________________________________________  The [facility] providers would like to encourage you to use [facility] to communicate with providers for non-urgent requests or questions.  Due to long hold times on the telephone, sending your provider a message by [facility] may be a faster and more efficient way to get a response.  Please allow 48 business hours for a response.  Please remember that this is for non-urgent requests.  _______________________________________________________  Thank you for choosing me and [facility].  Dr. Rush Landmark

## 2022-12-19 NOTE — Progress Notes (Unsigned)
GASTROENTEROLOGY OUTPATIENT CLINIC VISIT   Primary Care Provider Everrett Coombe, DO [address] [facility] [address]  [address] [place] [address] [phone]   Patient Profile: Jeremiah Short is a 62 y.o. male with a pmh significant for CAD (on Plavix), COPD/emphysema, MDD, hyperlipidemia, hypertension, diverticulosis, colon polyps (TA's), Barrett's esophagus, hiatal hernia, GERD, hemorrhoids (internal/external).  The patient presents to the [facility] for an evaluation and management of problem(s) noted below:  Problem List No diagnosis found.   History of Present Illness Please see prior GI notes for full details of HPI.  Interval History Patient returns for follow-up.  He is made a transition from Dr. Ardell Isaacs practice to my practice.  Patient recently underwent an upper and lower endoscopy in July.  Results are noted below.  He was dilated to 16 mm.  He had some mild improvement in his dysphagia symptoms (approximately 50%).  However as noted in my endoscopy report, there was some dilation to the esophagus as well as an esophageal diverticulum with mucosal changes that were concerning to me potential of the dysmotility also playing a role.  The patient had been experiencing hemorrhoidal bleeding as well and Dr. Russella Dar commented that previous banding with quick recurrence of bleeding would suggest potential need for colorectal surgery involvement for hemorrhoidectomy.  This has been stable overall.  He does wonder if next steps in evaluation should be pursued in regards to hemorrhoidectomy.  He is not constipated per his report.  Interval History asdf  GI Review of Systems Positive as above Negative for odynophagia, early satiety, anorexia, pain, melena  Review of Systems General: Denies fevers/chills/weight loss unintentionally Cardiovascular: Denies chest pain Pulmonary: Denies shortness of breath Gastroenterological: See HPI Genitourinary: Denies darkened  urine  Hematological: Positive for history of easy bruising/bleeding due to Plavix Dermatological: Denies jaundice Psychological: Mood is stable   Medications Current Outpatient Medications  Medication Sig Dispense Refill   albuterol (VENTOLIN HFA) 108 (90 Base) MCG/ACT inhaler TAKE 2 PUFFS BY MOUTH EVERY 6 HOURS AS NEEDED FOR WHEEZE OR SHORTNESS OF BREATH 8.5 each 1   aspirin EC 81 MG EC tablet Take 1 tablet (81 mg total) by mouth daily.     atorvastatin (LIPITOR) 40 MG tablet TAKE 1 TABLET BY MOUTH DAILY AT 6 PM. 90 tablet 1   clopidogrel (PLAVIX) 75 MG tablet Take 1 tablet (75 mg total) by mouth daily. 90 tablet 3   Cyanocobalamin (VITAMIN B-12) 2500 MCG SUBL Take 2,500 mcg by mouth every evening.     dapagliflozin propanediol (FARXIGA) 10 MG TABS tablet Take 1 tablet (10 mg total) by mouth daily before breakfast. 30 tablet 11   isosorbide mononitrate (IMDUR) 60 MG 24 hr tablet TAKE 1 TABLET BY MOUTH EVERY DAY 90 tablet 3   lansoprazole (PREVACID) 30 MG capsule TAKE 1 CAPSULE BY MOUTH DAILY BEFORE BREAKFAST. 90 capsule 2   metoprolol succinate (TOPROL-XL) 25 MG 24 hr tablet Take 1 tablet (25 mg total) by mouth daily. 30 tablet 1   Multiple Vitamin (MULTIVITAMIN WITH MINERALS) TABS tablet Take 1 tablet by mouth every evening.     nitroGLYCERIN (NITROSTAT) 0.4 MG SL tablet PLACE 1 TABLET UNDER THE TONGUE EVERY 5 MINUTES FOR 3 DOSES AS NEEDED FOR CHEST PAIN 25 tablet 5   sacubitril-valsartan (ENTRESTO) 24-26 MG TAKE 1 TABLET BY MOUTH TWICE A DAY 90 tablet 3   spironolactone (ALDACTONE) 25 MG tablet Take 0.5 tablets (12.5 mg total) by mouth daily. (Patient taking differently: Take 12.5 mg by  mouth every evening.) 30 tablet 3   thiamine 100 MG tablet Take 1 tablet (100 mg total) by mouth daily. (Patient taking differently: Take 100 mg by mouth every evening.)     Current Facility-Administered Medications  Medication Dose Route Frequency Provider Last Rate Last Admin   sodium chloride flush  (NS) 0.9 % injection 3 mL  3 mL Intravenous Q12H Louanne Skye Devoria Albe., NP       Facility-Administered Medications Ordered in Other Visits  Medication Dose Route Frequency Provider Last Rate Last Admin   regadenoson (LEXISCAN) injection SOLN 0.4 mg  0.4 mg Intravenous Once Jake Bathe, MD        Allergies Allergies  Allergen Reactions   Morphine And Related Other (See Comments)    dont remember the symptoms. Has tolerated dilaudid and oxycodone    Histories Past Medical History:  Diagnosis Date   Anxiety    Aortic atherosclerosis (HCC) 02/24/2021   Barrett's esophagus    Cardiomyopathy    Echocardiogram 10/23: EF 25-30, normal RVSF, mild MR, mild dilation of aortic root 41 mm, mild dilation of asc aorta 41 mm, RAP 8   Closed compression fracture of body of L1 vertebra (HCC) 02/24/2021   Coronary artery disease    Diverticulosis    Emphysema lung (HCC) 02/24/2021   Esophageal stricture    GERD (gastroesophageal reflux disease)    Hiatal hernia    History of ETOH abuse    Hypertension    Myocardial infarction [facility]) 2009   Pure hypercholesterolemia    Smoker    Stented coronary artery    Tubular adenoma of colon 2012   Wears glasses    Past Surgical History:  Procedure Laterality Date   APPENDECTOMY  10/02/2009   during colectomy   CARDIAC CATHETERIZATION  10/03/2007   placed 2 stents   CARDIAC CATHETERIZATION N/A 09/27/2016   Procedure: Left Heart Cath and Coronary Angiography;  Surgeon: Yates Decamp, MD;  Location: [facility] INVASIVE CV LAB;  Service: Cardiovascular;  Laterality: N/A;   CARDIAC CATHETERIZATION N/A 09/27/2016   Procedure: Coronary Stent Intervention;  Surgeon: Yates Decamp, MD;  Location: [facility] INVASIVE CV LAB;  Service: Cardiovascular;  Laterality: N/A;   CHOLECYSTECTOMY N/A 02/27/2021   Procedure: LAPAROSCOPIC CHOLECYSTECTOMY;  Surgeon: Harriette Bouillon, MD;  Location: [facility] ORS;  Service: General;  Laterality: N/A;   ESOPHAGEAL MANOMETRY N/A 10/11/2022   Procedure:  ESOPHAGEAL MANOMETRY (EM);  Surgeon: Lemar Lofty., MD;  Location: [facility] ENDOSCOPY;  Service: Gastroenterology;  Laterality: N/A;   EVALUATION UNDER ANESTHESIA WITH HEMORRHOIDECTOMY N/A 10/04/2022   Procedure: ANORECTAL EXAM UNDER ANESTHESIA WITH HEMORRHOIDECTOMY WITH LIGATION AND HEMORRHOIDOPEXY;  Surgeon: Karie Soda, MD;  Location: [facility] ORS;  Service: General;  Laterality: N/A;  GEN AND LOCAL   INGUINAL HERNIA REPAIR Left 09/16/2013   Procedure: HERNIA REPAIR INGUINAL ADULT;  Surgeon: Clovis Pu. Cornett, MD;  Location: [facility];  Service: General;  Laterality: Left;   INGUINAL HERNIA REPAIR Left 09/16/2013   Dr Luisa Hart   MOUTH SURGERY     RIGHT/LEFT HEART CATH AND CORONARY ANGIOGRAPHY N/A 08/01/2022   Procedure: RIGHT/LEFT HEART CATH AND CORONARY ANGIOGRAPHY;  Surgeon: Tonny Bollman, MD;  Location: [facility] INVASIVE CV LAB;  Service: Cardiovascular;  Laterality: N/A;   SIGMOIDECTOMY  10/28/2009   Dr Zachery Dakins.  Diveetriculitis w prior abscess   TONSILLECTOMY     WISDOM TOOTH EXTRACTION     Social History   Socioeconomic History   Marital status: Divorced    Spouse name: Not  on file   Number of children: 2   Years of education: Not on file   Highest education level: Not on file  Occupational History   Occupation: Location manager  Tobacco Use   Smoking status: Every Day    Packs/day: 0.75    Years: 40.00    Additional pack years: 0.00    Total pack years: 30.00    Types: Cigarettes   Smokeless tobacco: Never   Tobacco comments:    Pt given handout to quit smoking  Vaping Use   Vaping Use: Never used  Substance and Sexual Activity   Alcohol use: Not Currently    Alcohol/week: 5.0 standard drinks of alcohol    Types: 5 Shots of liquor per week    Comment: daily   Drug use: No   Sexual activity: Not on file  Other Topics Concern   Not on file  Social History Narrative   Not on file   Social Determinants of Health   Financial Resource Strain: Not on  file  Food Insecurity: Not on file  Transportation Needs: Not on file  Physical Activity: Not on file  Stress: Not on file  Social Connections: Not on file  Intimate Partner Violence: Not on file   Family History  Problem Relation Age of Onset   Lung cancer Mother    Heart disease Father        has pacemaker   Breast cancer Sister    Colon cancer Neg Hx    Colon polyps Neg Hx    Esophageal cancer Neg Hx    Kidney disease Neg Hx    Diabetes Neg Hx    Stomach cancer Neg Hx    Rectal cancer Neg Hx    Inflammatory bowel disease Neg Hx    Liver disease Neg Hx    Pancreatic cancer Neg Hx    I have reviewed his medical, social, and family history in detail and updated the electronic medical record as necessary.    PHYSICAL EXAMINATION  Ht 5\' 10"  (1.778 m)   Wt 156 lb (70.8 kg)   BMI 22.38 kg/m  Wt Readings from Last 3 Encounters:  12/19/22 156 lb (70.8 kg)  10/26/22 152 lb (68.9 kg)  10/04/22 156 lb (70.8 kg)  GEN: NAD, appears stated age, doesn't appear chronically ill PSYCH: Cooperative, without pressured speech EYE: Conjunctivae pink, sclerae anicteric ENT: MMM CV: Nontachycardic RESP: No or wheezing GI: NABS, soft, NT/ND, without rebound or guarding MSK/EXT: No lower extremity edema SKIN: No jaundice NEURO:  Alert & Oriented x 3, no focal deficits   REVIEW OF DATA  I reviewed the following data at the time of this encounter:  GI Procedures and Studies  July 2023 EGD - Discolored, texture changed mucosa in the esophagus. Biopsied. - Diverticulum in the distal esophagus. - Esophageal mucosal changes consistent with short-segment Barrett's esophagus. Biopsied. - Z-line irregular, 40 cm from the incisors. - Dilation in the entire esophagus. - 2 cm hiatal hernia. - Gastritis - not clearly portal gastropathy, but there is some potential for development of this. Biopsied. - No gross lesions in the duodenal bulb, in the first portion of the duodenum and in the second  portion of the duodenum.  Pathology Diagnosis 1. Surgical [P], gastric biopsy's - REACTIVE GASTROPATHY. - GASTRIC OXYNTIC GLANDS WITH MILD LUMINAL DILATATION, SUGGESTIVE OF PROTON PUMP INHIBITOR EFFECT. - NEGATIVE FOR AN INFLAMMATORY PATTERN PREDICTIVE OF HELICOBACTER PYLORI INFECTION. - NEGATIVE FOR INTESTINAL METAPLASIA AND MALIGNANCY. 2. Surgical [P], distal  esophagus - SQUAMOCOLUMNAR MUCOSA WITH LIMITED INTESTINAL METAPLASIA, CONSISTENT WITH BARRETT'S ESOPHAGUS IN THE PROPER CLINICAL CONTEXT. - NEGATIVE FOR DYSPLASIA AND MALIGNANCY. - NEGATIVE FOR INTRASQUAMOUS EOSINOPHILS. 3. Surgical [P], esophageal biopsy's - SQUAMOUS MUCOSA WITH REACTIVE CHANGES ASSOCIATED WITH REFLUX EFFECT (PARAKERATOSIS, BASAL HYPERPLASIA, ELONGATED PAPILLAE). - GMS STAIN NEGATIVE FOR FUNGI, WITH ADEQUATE CONTROL. - SMALL COLUMNAR EPITHELIAL FRAGMENT, WITHOUT IDENTIFIED INTESTINAL METAPLASIA. - NEGATIVE FOR DYSPLASIA AND MALIGNANCY.  Esophageal Manometry    Laboratory Studies  Reviewed those in epic  Imaging Studies  6/23 CT-Chest IMPRESSION: 1. Lung-RADS 2, benign appearance or behavior. Continue annual screening with low-dose chest CT without contrast in 12 months. 2. Unchanged mild circumferential esophageal wall thickening, most pronounced at the lower esophagus, most commonly due to reflux esophagitis. 3. Aortic Atherosclerosis (ICD10-I70.0) and Emphysema (ICD10-J43.9).   ASSESSMENT  Mr. Conway is a 62 y.o. male with a pmh significant for CAD (on Plavix), COPD/emphysema, MDD, hyperlipidemia, hypertension, diverticulosis, colon polyps (TA's), Barrett's esophagus, hiatal hernia, GERD, hemorrhoids (internal/external).  The patient is seen today for evaluation and management of:  No diagnosis found.  The patient is hemodynamically stable.  He has had some clinical improvement after our esophageal dilation.  However the mucosal changes, esophageal diverticulum, esophageal dilatation  suggestive to me about the potential of an underlying dysmotility rather than just a structural abnormality.  We certainly could increase the dilation side by trying to go up to 18 mm but I think with out having a significant improvement up to this point, I think esophageal manometry will be our next steps in the evaluation of this individual.  Depending on what the findings are there can determine what may be able to be offered and/or further dilatation up to 18 mm or larger.  He will maintain his PPI therapy due to his history of his Barrett's esophagus in an effort of trying to decrease his risk of progression.  Repeat endoscopy would otherwise be recommended at a 3-year interval if we do not do 1 sooner.  In regards to his hemorrhoidal bleeding, Dr. Russella Dar previously commented that with such a quick recurrence of bleeding that hemorrhoidal banding again may not be best and having the opportunity to do hemorrhoidectomy or banding plus hemorrhoidectomy should be considered and so a colorectal surgery referral will be placed.  We will use Anusol suppositories as well as potentially Calmol4 suppositories.  He is going to try to initiate FiberCon as well daily.  We have gone over toileting techniques as well to try and help with.  We will see where things stand after his esophageal manometry.  All patient questions were answered to the best of my ability, and the patient agrees to the aforementioned plan of action with follow-up as indicated.   PLAN  Continue PPI daily Proceed with esophageal manometry Pending findings of manometry will consider the role of repeat dilation up to 18 mm if possible if no evidence of a true dysmotility is found Initiate FiberCon daily Anusol suppositories nightly x1 week and then every other night until prescription is done May then use Anusol nightly x3 nights for significant bleeding or initiate Calmol 4 suppositories nightly x3 nights OTC Toileting techniques  discussed Repeat EGD for Barrett's surveillance in 2026 if no earlier endoscopy is performed for dilation Repeat colonoscopy in 2030   No orders of the defined types were placed in this encounter.   New Prescriptions   No medications on file   Modified Medications   No medications on file  Planned Follow Up No follow-ups on file.   Total Time in Face-to-Face and in Coordination of Care for patient including independent/personal interpretation/review of prior testing, medical history, examination, medication adjustment, communicating results with the patient directly, and documentation within the EHR is 25 minutes.   Corliss Parish, MD [facility] Advanced [facility]oscopy Office # [phone]

## 2022-12-20 ENCOUNTER — Encounter: Payer: Self-pay | Admitting: Gastroenterology

## 2022-12-20 DIAGNOSIS — K222 Esophageal obstruction: Secondary | ICD-10-CM | POA: Insufficient documentation

## 2022-12-20 DIAGNOSIS — R933 Abnormal findings on diagnostic imaging of other parts of digestive tract: Secondary | ICD-10-CM | POA: Insufficient documentation

## 2022-12-20 DIAGNOSIS — K219 Gastro-esophageal reflux disease without esophagitis: Secondary | ICD-10-CM | POA: Insufficient documentation

## 2022-12-21 ENCOUNTER — Other Ambulatory Visit: Payer: Self-pay

## 2022-12-21 ENCOUNTER — Ambulatory Visit (HOSPITAL_COMMUNITY)
Admission: RE | Admit: 2022-12-21 | Discharge: 2022-12-21 | Disposition: A | Discharge status: Home / Self Care | Payer: 59 | Attending: Gastroenterology | Admitting: Gastroenterology

## 2022-12-21 ENCOUNTER — Ambulatory Visit (HOSPITAL_COMMUNITY): Payer: 59 | Admitting: Anesthesiology

## 2022-12-21 ENCOUNTER — Encounter (HOSPITAL_COMMUNITY)
Admission: RE | Disposition: A | Discharge status: Home / Self Care | Payer: Self-pay | Source: Home / Self Care | Attending: Gastroenterology

## 2022-12-21 ENCOUNTER — Encounter (HOSPITAL_COMMUNITY): Payer: Self-pay | Admitting: Gastroenterology

## 2022-12-21 ENCOUNTER — Ambulatory Visit (HOSPITAL_BASED_OUTPATIENT_CLINIC_OR_DEPARTMENT_OTHER): Payer: 59 | Admitting: Anesthesiology

## 2022-12-21 DIAGNOSIS — J449 Chronic obstructive pulmonary disease, unspecified: Secondary | ICD-10-CM

## 2022-12-21 DIAGNOSIS — I899 Noninfective disorder of lymphatic vessels and lymph nodes, unspecified: Secondary | ICD-10-CM

## 2022-12-21 DIAGNOSIS — J439 Emphysema, unspecified: Secondary | ICD-10-CM | POA: Insufficient documentation

## 2022-12-21 DIAGNOSIS — R1310 Dysphagia, unspecified: Secondary | ICD-10-CM | POA: Diagnosis present

## 2022-12-21 DIAGNOSIS — E7800 Pure hypercholesterolemia, unspecified: Secondary | ICD-10-CM | POA: Insufficient documentation

## 2022-12-21 DIAGNOSIS — Z955 Presence of coronary angioplasty implant and graft: Secondary | ICD-10-CM | POA: Insufficient documentation

## 2022-12-21 DIAGNOSIS — I2510 Atherosclerotic heart disease of native coronary artery without angina pectoris: Secondary | ICD-10-CM | POA: Diagnosis not present

## 2022-12-21 DIAGNOSIS — K2289 Other specified disease of esophagus: Secondary | ICD-10-CM

## 2022-12-21 DIAGNOSIS — I10 Essential (primary) hypertension: Secondary | ICD-10-CM | POA: Insufficient documentation

## 2022-12-21 DIAGNOSIS — Z8249 Family history of ischemic heart disease and other diseases of the circulatory system: Secondary | ICD-10-CM | POA: Diagnosis not present

## 2022-12-21 DIAGNOSIS — F17210 Nicotine dependence, cigarettes, uncomplicated: Secondary | ICD-10-CM | POA: Diagnosis not present

## 2022-12-21 DIAGNOSIS — R1319 Other dysphagia: Secondary | ICD-10-CM

## 2022-12-21 DIAGNOSIS — K5710 Diverticulosis of small intestine without perforation or abscess without bleeding: Secondary | ICD-10-CM

## 2022-12-21 DIAGNOSIS — Z7982 Long term (current) use of aspirin: Secondary | ICD-10-CM | POA: Insufficient documentation

## 2022-12-21 DIAGNOSIS — I252 Old myocardial infarction: Secondary | ICD-10-CM | POA: Diagnosis not present

## 2022-12-21 DIAGNOSIS — K449 Diaphragmatic hernia without obstruction or gangrene: Secondary | ICD-10-CM | POA: Insufficient documentation

## 2022-12-21 DIAGNOSIS — K3189 Other diseases of stomach and duodenum: Secondary | ICD-10-CM

## 2022-12-21 DIAGNOSIS — Q396 Congenital diverticulum of esophagus: Secondary | ICD-10-CM | POA: Insufficient documentation

## 2022-12-21 HISTORY — PX: EUS: SHX5427

## 2022-12-21 HISTORY — PX: BOTOX INJECTION: SHX5754

## 2022-12-21 HISTORY — PX: SAVORY DILATION: SHX5439

## 2022-12-21 HISTORY — PX: SCLEROTHERAPY: SHX6841

## 2022-12-21 HISTORY — PX: ESOPHAGOGASTRODUODENOSCOPY (EGD) WITH PROPOFOL: SHX5813

## 2022-12-21 SURGERY — ESOPHAGOGASTRODUODENOSCOPY (EGD) WITH PROPOFOL
Anesthesia: Monitor Anesthesia Care

## 2022-12-21 MED ORDER — PROPOFOL 1000 MG/100ML IV EMUL
INTRAVENOUS | Status: AC
  Filled 2022-12-21: qty 100

## 2022-12-21 MED ORDER — PHENYLEPHRINE HCL (PRESSORS) 10 MG/ML IV SOLN
INTRAVENOUS | Status: AC
  Filled 2022-12-21: qty 1

## 2022-12-21 MED ORDER — LIDOCAINE HCL 1 % IJ SOLN
INTRAMUSCULAR | Status: DC | PRN
  Administered 2022-12-21: 50 mg via INTRADERMAL

## 2022-12-21 MED ORDER — PROPOFOL 10 MG/ML IV BOLUS
INTRAVENOUS | Status: DC | PRN
  Administered 2022-12-21 (×5): 20 mg via INTRAVENOUS

## 2022-12-21 MED ORDER — SUCRALFATE 1 GM/10ML PO SUSP: 1.0000 g | Freq: Two times a day (BID) | ORAL | 1 refills | Status: DC

## 2022-12-21 MED ORDER — CLOPIDOGREL BISULFATE 75 MG PO TABS: 75.0000 mg | ORAL_TABLET | Freq: Every day | ORAL | 3 refills | Status: DC

## 2022-12-21 MED ORDER — ONABOTULINUMTOXINA 100 UNITS IJ SOLR
INTRAMUSCULAR | Status: AC
  Filled 2022-12-21: qty 100

## 2022-12-21 MED ORDER — PHENYLEPHRINE HCL-NACL 20-0.9 MG/250ML-% IV SOLN
INTRAVENOUS | Status: DC | PRN
  Administered 2022-12-21: 30 ug/min via INTRAVENOUS

## 2022-12-21 MED ORDER — DEXMEDETOMIDINE HCL IN NACL 80 MCG/20ML IV SOLN
INTRAVENOUS | Status: DC | PRN
  Administered 2022-12-21: 8 ug via BUCCAL

## 2022-12-21 MED ORDER — PROPOFOL 500 MG/50ML IV EMUL
INTRAVENOUS | Status: DC | PRN
  Administered 2022-12-21: 150 ug/kg/min via INTRAVENOUS

## 2022-12-21 MED ORDER — LACTATED RINGERS IV SOLN: INTRAVENOUS | Status: DC | PRN

## 2022-12-21 MED ORDER — SODIUM CHLORIDE 0.9 % IV SOLN: INTRAVENOUS | Status: DC

## 2022-12-21 MED ORDER — LANSOPRAZOLE 30 MG PO CPDR: 30.0000 mg | DELAYED_RELEASE_CAPSULE | Freq: Two times a day (BID) | ORAL | 2 refills | Status: DC

## 2022-12-21 MED ORDER — SODIUM CHLORIDE (PF) 0.9 % IJ SOLN
INTRAMUSCULAR | Status: DC | PRN
  Administered 2022-12-21: 4 mL via SUBMUCOSAL

## 2022-12-21 SURGICAL SUPPLY — 15 items

## 2022-12-21 NOTE — Op Note (Signed)
[facility] Patient Name: Jeremiah Short Procedure Date: 12/21/2022 MRN: [id number] Attending MD: Justice Britain , MD, [id number] Date of Birth: Mar 18, 1961 CSN: [id number] Age: 62 Admit Type: Outpatient Procedure:                Upper EUS Indications:              Dysphagia, EGJOO versus developing type III                            achalasia rule out mass lesion, Esophageal                            diverticulum Providers:                Justice Britain, MD, Jeanella Cara, RN,                            Gloris Ham, Technician, Darliss Cheney,                            Technician, Mikey College, RN Referring MD:             Luetta Nutting Medicines:                Monitored Anesthesia Care Complications:            No immediate complications. Estimated Blood Loss:     Estimated blood loss was minimal. Procedure:                Pre-Anesthesia Assessment:                           - Prior to the procedure, a History and Physical                            was performed, and patient medications and                            allergies were reviewed. The patient's tolerance of                            previous anesthesia was also reviewed. The risks                            and benefits of the procedure and the sedation                            options and risks were discussed with the patient.                            All questions were answered, and informed consent                            was obtained. Prior Anticoagulants: The patient has                            taken Plavix (clopidogrel), last  dose was 5 days                            prior to procedure. ASA Grade Assessment: III - A                            patient with severe systemic disease. After                            reviewing the risks and benefits, the patient was                            deemed in satisfactory condition to undergo the                             procedure.                           After obtaining informed consent, the endoscope was                            passed under direct vision. Throughout the                            procedure, the patient's blood pressure, pulse, and                            oxygen saturations were monitored continuously. The                            [id number] Olympus endoscope was introduced                            through the mouth, and advanced to the second part                            of duodenum. The [id number]-[id number] Olympus                            radial ultrasound scope was introduced through the                            mouth, and advanced to the stomach for ultrasound                            examination from the esophagus and stomach. The                            upper EUS was accomplished without difficulty. The                            patient tolerated the procedure. Scope In: Scope Out: Findings:      ENDOSCOPIC FINDING: :      Diffuse moderate mucosal changes characterized by congestion and  altered       texture were found in the entire esophagus.      The lumen of the esophagus was moderately dilated.      A non-bleeding diverticulum with a large opening and no stigmata of       recent bleeding was found in the distal esophagus (35 to 39 cm).      No gross lesions were noted at the gastroesophageal junction. When       traversing the GE junction, a slight pop was felt.      The Z-line was irregular and was found 40 cm from the incisors.      A 4 cm hiatal hernia was present.      Patchy mildly erythematous mucosa without bleeding was found in the       entire examined stomach. Previously biopsied so not rebiopsy.      No gross lesions were noted in the duodenal bulb, in the first portion       of the duodenum and in the second portion of the duodenum.      ENDOSONOGRAPHIC FINDING: :      Mild diffuse wall thickening was visualized  endosonographically in the       gastroesophageal junction. This was encountered at 39 cm from the       incisors and extended to 40 cm (at the GE junction). This appeared to be       primarily due to thickening of the deep mucosa (Layer 2). The esophageal       wall measured up to 4 mm in total thickness.      Endosonographic imaging in the thoracic esophagus showed no intramural       (subepithelial) lesion or wall thickening.      No malignant-appearing lymph nodes were visualized in the lower       paraesophageal mediastinum (level 8L) and paracardial region (level 16).      Endosonographic imaging in the visualized portion of the liver showed no       mass.      After the rest of the EGD/EUS was completed, a guidewire was placed and       the scope was withdrawn. Dilation was performed in the esophagus with a       Savary dilator with mild resistance at 14 mm, moderate resistance at 16       mm and severe resistance at 17 mm. The dilation site was examined       following endoscope reinsertion and showed mild mucosal disruption, mild       improvement in luminal narrowing at the GE junction and no perforation.       The gastroesophageal junction was successfully injected with 100 units       botulinum toxin in 4 quadrants (retroflexion not able to be performed in       the hiatal hernia). Impression:               EGD impression:                           - Congested, texture changed mucosa in the                            esophagus.                           -  Dilation in the entire esophagus.                           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                           - No gross lesions in the gastroesophageal junction.                           - Z-line irregular, 40 cm from the incisors.                           - 4 cm hiatal hernia.                           - Erythematous mucosa in the stomach -previously                             biopsied.                           - No gross lesions in the duodenal bulb, in the                            first portion of the duodenum and in the second                            portion of the duodenum.                           EUS impression:                           - Mild wall thickening was seen in the                            gastroesophageal junction. The thickening appeared                            to primarily be within the deep mucosa (Layer 2).                           - No malignant-appearing lymph nodes were                            visualized in the lower paraesophageal mediastinum                            (level 8L) and paracardial region (level 16).                           - Dilation performed in the esophagus up to 17 mm  savory. Mucosal wrent noted at GE junction.                           - The gastroesophageal junction successfully                            injected with botulinum toxin in 4 quadrants. Moderate Sedation:      Not Applicable - Patient had care per Anesthesia. Recommendation:           - The patient will be observed post-procedure,                            until all discharge criteria are met.                           - Discharge patient to home.                           - Patient has a contact number available for                            emergencies. The signs and symptoms of potential                            delayed complications were discussed with the                            patient. Return to normal activities tomorrow.                            Written discharge instructions were provided to the                            patient.                           - Full liquid diet today.                           - Observe patient's clinical course.                           - Increase PPI to twice daily for 1 month.                           - Carafate liquid twice daily for 1  month.                           - May restart Plavix on 3/23 PM.                           - Pending how the next 3 to 4 weeks ago, patient's                            symptoms significantly improved then we will  continue to monitor expectantly. If however the                            patient is not having significant improvement in                            dysphagia symptoms, then referral to [facility] for                            consideration of POEM for developing type III                            achalasia v EGJOO. The patient's large esophageal                            diverticulum is not clear if that would be                            problematic for potential POEM but if an issue,                            then will need referral to thoracic surgery.                           - The findings and recommendations were discussed                            with the patient.                           - The findings and recommendations were discussed                            with the patient's family. Procedure Code(s):        --- Professional ---                           [phone], Esophagogastroduodenoscopy, flexible,                            transoral; with endoscopic ultrasound examination                            limited to the esophagus, stomach or duodenum, and                            adjacent structures                           43248, Esophagogastroduodenoscopy, flexible,                            transoral; with insertion of guide wire followed by  passage of dilator(s) through esophagus over guide                            wire                           43236, 59, Esophagogastroduodenoscopy, flexible,                            transoral; with directed submucosal injection(s),                            any substance Diagnosis Code(s):        --- Professional ---                           K22.89, Other  specified disease of esophagus                           K31.89, Other diseases of stomach and duodenum                           Q39.6, Congenital diverticulum of esophagus                           K44.9, Diaphragmatic hernia without obstruction or                            gangrene                           I89.9, Noninfective disorder of lymphatic vessels                            and lymph nodes, unspecified                           R13.10, Dysphagia, unspecified CPT copyright 2022 [employer]. All rights reserved. The codes documented in this report are preliminary and upon coder review may  be revised to meet current compliance requirements. Justice Britain, MD 12/21/2022 1:20:27 PM Number of Addenda: 0

## 2022-12-21 NOTE — Discharge Instructions (Addendum)
Full liquid diet today; soft diet tomorrow.  YOU HAD AN ENDOSCOPIC PROCEDURE TODAY: Refer to the procedure report and other information in the discharge instructions given to you for any specific questions about what was found during the examination. If this information does not answer your questions, please call [place] office at [phone] to clarify.   YOU SHOULD EXPECT: Some feelings of bloating in the abdomen. Passage of more gas than usual. Walking can help get rid of the air that was put into your GI tract during the procedure and reduce the bloating. If you had a lower endoscopy (such as a colonoscopy or flexible sigmoidoscopy) you may notice spotting of blood in your stool or on the toilet paper. Some abdominal soreness may be present for a day or two, also.  DIET: Your first meal following the procedure should be a light meal and then it is ok to progress to your normal diet. A half-sandwich or bowl of soup is an example of a good first meal. Heavy or fried foods are harder to digest and may make you feel nauseous or bloated. Drink plenty of fluids but you should avoid alcoholic beverages for 24 hours. If you had a esophageal dilation, please see attached instructions for diet.    ACTIVITY: Your care partner should take you home directly after the procedure. You should plan to take it easy, moving slowly for the rest of the day. You can resume normal activity the day after the procedure however YOU SHOULD NOT DRIVE, use power tools, machinery or perform tasks that involve climbing or major physical exertion for 24 hours (because of the sedation medicines used during the test).   SYMPTOMS TO REPORT IMMEDIATELY: A gastroenterologist can be reached at any hour. Please call [phone]  for any of the following symptoms:   Following upper endoscopy (EGD, EUS, ERCP, esophageal dilation) Vomiting of blood or coffee ground material  New, significant abdominal pain  New, significant chest pain  or pain under the shoulder blades  Painful or persistently difficult swallowing  New shortness of breath  Black, tarry-looking or red, bloody stools  FOLLOW UP:  If any biopsies were taken you will be contacted by phone or by letter within the next 1-3 weeks. Call [phone]  if you have not heard about the biopsies in 3 weeks.  Please also call with any specific questions about appointments or follow up tests.

## 2022-12-21 NOTE — Anesthesia Preprocedure Evaluation (Addendum)
Anesthesia Evaluation  Patient identified by MRN, date of birth, ID band Patient awake    Reviewed: Allergy & Precautions, NPO status , Patient's Chart, lab work & pertinent test results, reviewed documented beta blocker date and time   History of Anesthesia Complications Negative for: history of anesthetic complications  Airway Mallampati: II  TM Distance: >3 FB Neck ROM: Full    Dental  (+) Dental Advisory Given, Chipped   Pulmonary COPD,  COPD inhaler, Current SmokerPatient did not abstain from smoking.   Pulmonary exam normal        Cardiovascular hypertension, Pt. on home beta blockers and Pt. on medications + CAD, + Past MI, + Cardiac Stents and +CHF  Normal cardiovascular exam   '23 Cath - 1.  Widely patent left main 2.  Patent LAD stents with mild in-stent restenosis of the mid vessel stent but no high-grade obstruction and continued patency of the large first diagonal branch 3.  Patent left circumflex no significant stenosis 4.  Patent RCA with no significant stenosis 5.  Normal/low right heart pressures  '23 TTE - LV function is severely depreseed with diffuse hypokinesis, distal  inferior, distarl inferoseptal and apical akinesis. EF 25 to 30%. Mild mitral valve  regurgitation. There is mild dilatation of the aortic root, measuring 41 mm. There is mild dilatation of the ascending aorta, measuring 41 mm.      Neuro/Psych  PSYCHIATRIC DISORDERS Anxiety Depression    negative neurological ROS     GI/Hepatic hiatal hernia,GERD  Medicated and Controlled,,(+)     substance abuse  alcohol use Esophageal stricture    Endo/Other  negative endocrine ROS    Renal/GU negative Renal ROS     Musculoskeletal negative musculoskeletal ROS (+)    Abdominal   Peds  Hematology  On plavix    Anesthesia Other Findings   Reproductive/Obstetrics                             Anesthesia  Physical Anesthesia Plan  ASA: 4  Anesthesia Plan: MAC   Post-op Pain Management:    Induction:   PONV Risk Score and Plan: 0 and Propofol infusion and Treatment may vary due to age or medical condition  Airway Management Planned: Nasal Cannula and Natural Airway  Additional Equipment: None  Intra-op Plan:   Post-operative Plan:   Informed Consent: I have reviewed the patients History and Physical, chart, labs and discussed the procedure including the risks, benefits and alternatives for the proposed anesthesia with the patient or authorized representative who has indicated his/her understanding and acceptance.       Plan Discussed with: CRNA and Anesthesiologist  Anesthesia Plan Comments:         Anesthesia Quick Evaluation

## 2022-12-21 NOTE — Interval H&P Note (Signed)
History and Physical Interval Note:  12/21/2022 10:59 AM  Jeremiah Short  has presented today for surgery, with the diagnosis of Dysphagia.  The various methods of treatment have been discussed with the patient and family. After consideration of risks, benefits and other options for treatment, the patient has consented to  Procedure(s): ESOPHAGOGASTRODUODENOSCOPY (EGD) WITH PROPOFOL (N/A) UPPER ENDOSCOPIC ULTRASOUND (EUS) RADIAL (N/A) as a surgical intervention.  The patient's history has been reviewed, patient examined, no change in status, stable for surgery.  I have reviewed the patient's chart and labs.  Questions were answered to the patient's satisfaction.     [employer]

## 2022-12-21 NOTE — Transfer of Care (Signed)
Immediate Anesthesia Transfer of Care Note  Patient: Nicholaas Heichel  Procedure(s) Performed: ESOPHAGOGASTRODUODENOSCOPY (EGD) WITH PROPOFOL UPPER ENDOSCOPIC ULTRASOUND (EUS) RADIAL SAVORY DILATION BOTOX INJECTION SCLEROTHERAPY  Patient Location: PACU and Endoscopy Unit  Anesthesia Type:MAC  Level of Consciousness: awake, alert , oriented, and patient cooperative  Airway & Oxygen Therapy: Patient Spontanous Breathing and Patient connected to face mask oxygen  Post-op Assessment: Report given to RN and Post -op Vital signs reviewed and stable  Post vital signs: Reviewed and stable  Last Vitals:  Vitals Value Taken Time  BP 101/78 12/21/22 1308  Temp 36.6 C 12/21/22 1307  Pulse 79 12/21/22 1310  Resp 28 12/21/22 1310  SpO2 98 % 12/21/22 1310  Vitals shown include unvalidated device data.  Last Pain:  Vitals:   12/21/22 1307  TempSrc: Temporal  PainSc: 0-No pain         Complications: No notable events documented.

## 2022-12-21 NOTE — Anesthesia Postprocedure Evaluation (Signed)
Anesthesia Post Note  Patient: Jerrius Ricciardelli  Procedure(s) Performed: ESOPHAGOGASTRODUODENOSCOPY (EGD) WITH PROPOFOL UPPER ENDOSCOPIC ULTRASOUND (EUS) RADIAL SAVORY DILATION BOTOX INJECTION SCLEROTHERAPY     Patient location during evaluation: PACU Anesthesia Type: MAC Level of consciousness: awake and alert Pain management: pain level controlled Vital Signs Assessment: post-procedure vital signs reviewed and stable Respiratory status: spontaneous breathing, nonlabored ventilation and respiratory function stable Cardiovascular status: stable and blood pressure returned to baseline Anesthetic complications: no   No notable events documented.  Last Vitals:  Vitals:   12/21/22 1317 12/21/22 1330  BP: 107/79 110/86  Pulse: 77 70  Resp: (!) 28 (!) 25  Temp:    SpO2: 93% 93%    Last Pain:  Vitals:   12/21/22 1317  TempSrc:   PainSc: 0-No pain                 Audry Pili

## 2022-12-22 ENCOUNTER — Encounter (HOSPITAL_COMMUNITY): Payer: Self-pay | Admitting: Gastroenterology

## 2023-01-19 ENCOUNTER — Ambulatory Visit: Payer: 59 | Admitting: Cardiology

## 2023-01-25 ENCOUNTER — Other Ambulatory Visit: Payer: Self-pay | Admitting: Family Medicine

## 2023-01-29 ENCOUNTER — Other Ambulatory Visit: Payer: Self-pay | Admitting: Gastroenterology

## 2023-02-10 ENCOUNTER — Other Ambulatory Visit: Payer: Self-pay | Admitting: Nurse Practitioner

## 2023-02-13 ENCOUNTER — Ambulatory Visit (INDEPENDENT_AMBULATORY_CARE_PROVIDER_SITE_OTHER): Payer: 59 | Admitting: Family Medicine

## 2023-02-13 ENCOUNTER — Encounter: Payer: Self-pay | Admitting: Family Medicine

## 2023-02-13 VITALS — BP 112/68 | HR 68 | Ht 70.0 in | Wt 154.0 lb

## 2023-02-13 DIAGNOSIS — J439 Emphysema, unspecified: Secondary | ICD-10-CM

## 2023-02-13 DIAGNOSIS — I5042 Chronic combined systolic (congestive) and diastolic (congestive) heart failure: Secondary | ICD-10-CM | POA: Diagnosis not present

## 2023-02-13 DIAGNOSIS — F1090 Alcohol use, unspecified, uncomplicated: Secondary | ICD-10-CM

## 2023-02-13 DIAGNOSIS — F3341 Major depressive disorder, recurrent, in partial remission: Secondary | ICD-10-CM

## 2023-02-13 DIAGNOSIS — K2270 Barrett's esophagus without dysplasia: Secondary | ICD-10-CM

## 2023-02-13 NOTE — Assessment & Plan Note (Signed)
Managed by cardiology.  He is doing well with current medications.  No new anginal symptoms.

## 2023-02-13 NOTE — Assessment & Plan Note (Signed)
Counseled on cutting back on his alcohol intake.  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  Reviewed long-term consequences of alcohol use.

## 2023-02-13 NOTE — Assessment & Plan Note (Signed)
Denies significant symptoms at this time.  Counseled extensively on smoking cessation.

## 2023-02-13 NOTE — Assessment & Plan Note (Signed)
He will continue lansoprazole.

## 2023-02-13 NOTE — Assessment & Plan Note (Signed)
He is off of Effexor at this time.  Discussed with him that his depression is unlikely to improve significantly with his continued heavy alcohol use.

## 2023-02-13 NOTE — Progress Notes (Signed)
Jeremiah Short - 62 y.o. male MRN [id number]  Date of birth: 1961-03-17  Subjective Chief Complaint  Patient presents with   Hypertension    HPI Jeremiah Short is a 62 year old male here today for follow-up visit.  He reports he is doing pretty well at this time.  Recently had EGD for dysphagia.  His dysphagia has not really changed a whole lot since having this done.  Remains on Prevacid.  Continue to see cardiology.  Stable symptoms at this time.  Denies new anginal symptoms.  He has not had increased shortness of breath.  He is on Entresto, Imdur, Aldactone and Toprol.  Tolerating medications well at this time.  He does continue to smoke fairly heavily.  He has tried to cut back and quit but has been unsuccessful.  He he does also consume a fair amount of alcohol.  He is not interested in quitting this at this time either.  Mood is fairly stable.  Still continues to have decreased motivation.  He is off of Effexor at this time.  Does not want to restart at this time.  ROS:  A comprehensive ROS was completed and negative except as noted per HPI    Allergies  Allergen Reactions   Morphine And Related Other (See Comments)    dont remember the symptoms. Has tolerated dilaudid and oxycodone    Past Medical History:  Diagnosis Date   Anxiety    Aortic atherosclerosis (HCC) 02/24/2021   Barrett's esophagus    Cardiomyopathy    Echocardiogram 10/23: EF 25-30, normal RVSF, mild MR, mild dilation of aortic root 41 mm, mild dilation of asc aorta 41 mm, RAP 8   Closed compression fracture of body of L1 vertebra (HCC) 02/24/2021   Coronary artery disease    Diverticulosis    Emphysema lung (HCC) 02/24/2021   Esophageal stricture    GERD (gastroesophageal reflux disease)    Hiatal hernia    History of ETOH abuse    Hypertension    Myocardial infarction [facility]) 2009   Pure hypercholesterolemia    Smoker    Stented coronary artery    Tubular adenoma of colon 2012   Wears glasses     Past  Surgical History:  Procedure Laterality Date   APPENDECTOMY  10/02/2009   during colectomy   BOTOX INJECTION  12/21/2022   Procedure: BOTOX INJECTION;  Surgeon: Lemar Lofty., MD;  Location: [facility];  Service: Gastroenterology;;   CARDIAC CATHETERIZATION  10/03/2007   placed 2 stents   CARDIAC CATHETERIZATION N/A 09/27/2016   Procedure: Left Heart Cath and Coronary Angiography;  Surgeon: Yates Decamp, MD;  Location: [facility] INVASIVE CV LAB;  Service: Cardiovascular;  Laterality: N/A;   CARDIAC CATHETERIZATION N/A 09/27/2016   Procedure: Coronary Stent Intervention;  Surgeon: Yates Decamp, MD;  Location: [facility] INVASIVE CV LAB;  Service: Cardiovascular;  Laterality: N/A;   CHOLECYSTECTOMY N/A 02/27/2021   Procedure: LAPAROSCOPIC CHOLECYSTECTOMY;  Surgeon: Harriette Bouillon, MD;  Location: [facility];  Service: General;  Laterality: N/A;   ESOPHAGEAL MANOMETRY N/A 10/11/2022   Procedure: ESOPHAGEAL MANOMETRY (EM);  Surgeon: Lemar Lofty., MD;  Location: [facility];  Service: Gastroenterology;  Laterality: N/A;   ESOPHAGOGASTRODUODENOSCOPY (EGD) WITH PROPOFOL N/A 12/21/2022   Procedure: ESOPHAGOGASTRODUODENOSCOPY (EGD) WITH PROPOFOL;  Surgeon: Meridee Score Netty Starring., MD;  Location: [facility];  Service: Gastroenterology;  Laterality: N/A;   EUS N/A 12/21/2022   Procedure: UPPER ENDOSCOPIC ULTRASOUND (EUS) RADIAL;  Surgeon: Lemar Lofty., MD;  Location: [facility];  Service: Gastroenterology;  Laterality: N/A;   EVALUATION UNDER ANESTHESIA WITH HEMORRHOIDECTOMY N/A 10/04/2022   Procedure: ANORECTAL EXAM UNDER ANESTHESIA WITH HEMORRHOIDECTOMY WITH LIGATION AND HEMORRHOIDOPEXY;  Surgeon: Karie Soda, MD;  Location: [facility];  Service: General;  Laterality: N/A;  GEN AND LOCAL   INGUINAL HERNIA REPAIR Left 09/16/2013   Procedure: HERNIA REPAIR INGUINAL ADULT;  Surgeon: Clovis Pu. Cornett, MD;  Location: [facility];  Service: General;  Laterality: Left;   INGUINAL  HERNIA REPAIR Left 09/16/2013   Dr Luisa Hart   MOUTH SURGERY     RIGHT/LEFT HEART CATH AND CORONARY ANGIOGRAPHY N/A 08/01/2022   Procedure: RIGHT/LEFT HEART CATH AND CORONARY ANGIOGRAPHY;  Surgeon: Tonny Bollman, MD;  Location: [facility] INVASIVE CV LAB;  Service: Cardiovascular;  Laterality: N/A;   SAVORY DILATION N/A 12/21/2022   Procedure: SAVORY DILATION;  Surgeon: Meridee Score Netty Starring., MD;  Location: [facility];  Service: Gastroenterology;  Laterality: N/A;   SCLEROTHERAPY  12/21/2022   Procedure: SCLEROTHERAPY;  Surgeon: Meridee Score Netty Starring., MD;  Location: [facility];  Service: Gastroenterology;;   SIGMOIDECTOMY  10/28/2009   Dr Zachery Dakins.  Diveetriculitis w prior abscess   TONSILLECTOMY     WISDOM TOOTH EXTRACTION      Social History   Socioeconomic History   Marital status: Divorced    Spouse name: Not on file   Number of children: 2   Years of education: Not on file   Highest education level: Not on file  Occupational History   Occupation: Location manager  Tobacco Use   Smoking status: Every Day    Packs/day: 0.75    Years: 40.00    Additional pack years: 0.00    Total pack years: 30.00    Types: Cigarettes   Smokeless tobacco: Never   Tobacco comments:    Pt given handout to quit smoking  Vaping Use   Vaping Use: Never used  Substance and Sexual Activity   Alcohol use: Not Currently    Alcohol/week: 5.0 standard drinks of alcohol    Types: 5 Shots of liquor per week    Comment: daily   Drug use: No   Sexual activity: Not on file  Other Topics Concern   Not on file  Social History Narrative   Not on file   Social Determinants of Health   Financial Resource Strain: Not on file  Food Insecurity: Not on file  Transportation Needs: Not on file  Physical Activity: Not on file  Stress: Not on file  Social Connections: Not on file    Family History  Problem Relation Age of Onset   Lung cancer Mother    Heart disease Father        has pacemaker    Breast cancer Sister    Colon cancer Neg Hx    Colon polyps Neg Hx    Esophageal cancer Neg Hx    Kidney disease Neg Hx    Diabetes Neg Hx    Stomach cancer Neg Hx    Rectal cancer Neg Hx    Inflammatory bowel disease Neg Hx    Liver disease Neg Hx    Pancreatic cancer Neg Hx     Health Maintenance  Topic Date Due   Lung Cancer Screening  03/03/2023   COVID-19 Vaccine (4 - 2023-24 season) 08/01/2023 (Originally 06/02/2022)   INFLUENZA VACCINE  05/03/2023   DTaP/Tdap/Td (3 - Td or Tdap) 10/23/2028   COLONOSCOPY (Pts 45-88yrs Insurance coverage will need to be confirmed)  04/14/2029   Hepatitis C Screening  Completed  Zoster Vaccines- Shingrix  Completed   HPV VACCINES  Aged Out   HIV Screening  Discontinued     ----------------------------------------------------------------------------------------------------------------------------------------------------------------------------------------------------------------- Physical Exam BP 112/68 (BP Location: Left Arm, Patient Position: Sitting, Cuff Size: Normal)   Pulse 68   Ht 5\' 10"  (1.778 m)   Wt 154 lb (69.9 kg)   SpO2 96%   BMI 22.10 kg/m   Physical Exam Constitutional:      Appearance: Normal appearance.  HENT:     Head: Normocephalic and atraumatic.  Eyes:     General: No scleral icterus. Cardiovascular:     Rate and Rhythm: Normal rate and regular rhythm.  Pulmonary:     Effort: Pulmonary effort is normal.     Breath sounds: Normal breath sounds.  Musculoskeletal:     Cervical back: Neck supple.  Neurological:     General: No focal deficit present.     Mental Status: He is alert.  Psychiatric:        Mood and Affect: Mood normal.        Behavior: Behavior normal.     ------------------------------------------------------------------------------------------------------------------------------------------------------------------------------------------------------------------- Assessment and  Plan  Chronic combined systolic (congestive) and diastolic (congestive) heart failure (HCC) Managed by cardiology.  He is doing well with current medications.  No new anginal symptoms.  Emphysema lung (HCC) Denies significant symptoms at this time.  Counseled extensively on smoking cessation.  Barrett's esophagus without dysplasia He will continue lansoprazole.  Alcohol use disorder Counseled on cutting back on his alcohol intake.  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  Reviewed long-term consequences of alcohol use.  MDD (major depressive disorder) He is off of Effexor at this time.  Discussed with him that his depression is unlikely to improve significantly with his continued heavy alcohol use.   No orders of the defined types were placed in this encounter.   Return in about 6 months (around 08/16/2023) for HTN/Mood.    This visit occurred during the SARS-CoV-2 public health emergency.  Safety protocols were in place, including screening questions prior to the visit, additional usage of staff PPE, and extensive cleaning of exam room while observing appropriate contact time as indicated for disinfecting solutions.

## 2023-03-07 ENCOUNTER — Other Ambulatory Visit: Payer: Self-pay | Admitting: Gastroenterology

## 2023-05-07 ENCOUNTER — Telehealth: Payer: Self-pay | Admitting: Family Medicine

## 2023-05-07 NOTE — Telephone Encounter (Signed)
Pt called. He states he needs a refill on his atorvastatin but was told by pharmacy he needs an appointment.  He states his next scheduled appointment is not until November.  Please advise.

## 2023-05-09 ENCOUNTER — Telehealth: Payer: Self-pay

## 2023-05-09 NOTE — Telephone Encounter (Signed)
   Pre-operative Risk Assessment    Patient Name: Jeremiah Short  DOB: July 21, 1961 MRN: [id number]     Request for Surgical Clearance    Procedure:  Surgical extraction of 2 teeth   Date of Surgery:  Clearance TBD                                 Surgeon:  Dr. Lutricia Feil Surgeon's Group or Practice Name:  [facility]  Phone number:  [phone] Fax number:  [phone]   Type of Clearance Requested:   - Pharmacy:  Hold Clopidogrel (Plavix) 5-7 days  , Farxiga 3 days    Type of Anesthesia:  Conscious sedation    Additional requests/questions:    [name]   05/09/2023, 3:20 PM

## 2023-05-10 MED ORDER — ATORVASTATIN CALCIUM 40 MG PO TABS: 40.0000 mg | ORAL_TABLET | Freq: Every day | ORAL | 3 refills | Status: DC

## 2023-05-10 NOTE — Telephone Encounter (Signed)
.     Patient Name: Jeremiah Short  DOB: [birth year] MRN: [id number]  Primary Cardiologist: Donato Schultz, MD  Chart reviewed as part of pre-operative protocol coverage.   IF SIMPLE EXTRACTION/CLEANINGS: Simple dental extractions (i.e. 1-2 teeth) are considered low risk procedures per guidelines and generally do not require any specific cardiac clearance. It is also generally accepted that for simple extractions and dental cleanings, there is no need to interrupt blood thinner therapy or Farxiga.    SBE prophylaxis is not required for the patient from a cardiac standpoint.  I will route this recommendation to the requesting party via Epic fax function and remove from pre-op pool.  Please call with questions.  Joni Reining, NP 05/10/2023, 11:08 AM

## 2023-06-21 ENCOUNTER — Ambulatory Visit (HOSPITAL_COMMUNITY): Payer: 59 | Attending: Internal Medicine

## 2023-06-21 DIAGNOSIS — I361 Nonrheumatic tricuspid (valve) insufficiency: Secondary | ICD-10-CM | POA: Diagnosis not present

## 2023-06-21 DIAGNOSIS — I77819 Aortic ectasia, unspecified site: Secondary | ICD-10-CM | POA: Insufficient documentation

## 2023-06-21 LAB — ECHOCARDIOGRAM COMPLETE
Area-P 1/2: 2.84 cm2
S' Lateral: 3.6 cm

## 2023-06-21 MED ORDER — PERFLUTREN LIPID MICROSPHERE
3.0000 mL | INTRAVENOUS | Status: AC | PRN
  Administered 2023-06-21: 3 mL via INTRAVENOUS

## 2023-07-31 ENCOUNTER — Other Ambulatory Visit: Payer: Self-pay | Admitting: Family Medicine

## 2023-07-31 ENCOUNTER — Other Ambulatory Visit: Payer: Self-pay | Admitting: Cardiology

## 2023-08-07 ENCOUNTER — Other Ambulatory Visit: Payer: Self-pay | Admitting: Cardiology

## 2023-08-15 ENCOUNTER — Other Ambulatory Visit: Payer: Self-pay | Admitting: Nurse Practitioner

## 2023-08-16 ENCOUNTER — Ambulatory Visit: Payer: 59 | Admitting: Family Medicine

## 2023-08-17 ENCOUNTER — Other Ambulatory Visit: Payer: Self-pay | Admitting: Cardiology

## 2023-08-17 ENCOUNTER — Other Ambulatory Visit: Payer: Self-pay | Admitting: Nurse Practitioner

## 2023-08-29 ENCOUNTER — Telehealth: Payer: Self-pay | Admitting: Nurse Practitioner

## 2023-08-29 MED ORDER — ENTRESTO 24-26 MG PO TABS: 1.0000 | ORAL_TABLET | Freq: Two times a day (BID) | ORAL | 0 refills | Status: DC

## 2023-08-29 MED ORDER — DAPAGLIFLOZIN PROPANEDIOL 10 MG PO TABS: 10.0000 mg | ORAL_TABLET | Freq: Every day | ORAL | 0 refills | Status: DC

## 2023-08-29 NOTE — Telephone Encounter (Signed)
Patient walked in requesting to speak with nurse regarding his [place] and Sherryll Burger.  Patient states he was laid off from his job in October and his insurance runs out at the end of this month. He states he will seek new insurance on marketplace, but for now would like 90 day supply for these medications sent to his pharmacy.   Informed patient he is overdue for his 1 year F/U with Dr. [place] Callas states he will call to schedule appt another day.  Patient denied need for referral to social worker at this time, will call if that changes.  90 day supply of Entresto and [place] sent to [facility] in [place]. Co-pay card and patient assistance information/applications given to patient for both medications. Instructed patient to call our office if he has any questions or further needs. Patient verbalized understanding and expressed appreciation for assistance today.

## 2023-09-23 ENCOUNTER — Other Ambulatory Visit: Payer: Self-pay | Admitting: Nurse Practitioner

## 2023-10-18 ENCOUNTER — Other Ambulatory Visit: Payer: Self-pay | Admitting: Gastroenterology

## 2023-11-01 ENCOUNTER — Other Ambulatory Visit: Payer: Self-pay

## 2023-11-01 MED ORDER — CLOPIDOGREL BISULFATE 75 MG PO TABS: 75.0000 mg | ORAL_TABLET | Freq: Every day | ORAL | 0 refills | Status: DC

## 2023-11-07 ENCOUNTER — Other Ambulatory Visit: Payer: Self-pay | Admitting: Cardiology

## 2023-12-06 ENCOUNTER — Other Ambulatory Visit: Payer: Self-pay

## 2023-12-06 MED ORDER — CLOPIDOGREL BISULFATE 75 MG PO TABS: 75.0000 mg | ORAL_TABLET | Freq: Every day | ORAL | 0 refills | Status: DC

## 2023-12-10 ENCOUNTER — Other Ambulatory Visit: Payer: Self-pay | Admitting: Nurse Practitioner

## 2023-12-14 ENCOUNTER — Emergency Department (HOSPITAL_BASED_OUTPATIENT_CLINIC_OR_DEPARTMENT_OTHER)
Admission: EM | Admit: 2023-12-14 | Discharge: 2023-12-14 | Disposition: A | Discharge status: Home / Self Care | Attending: Emergency Medicine | Admitting: Emergency Medicine

## 2023-12-14 ENCOUNTER — Emergency Department (HOSPITAL_BASED_OUTPATIENT_CLINIC_OR_DEPARTMENT_OTHER)

## 2023-12-14 ENCOUNTER — Other Ambulatory Visit: Payer: Self-pay

## 2023-12-14 DIAGNOSIS — I959 Hypotension, unspecified: Secondary | ICD-10-CM | POA: Diagnosis not present

## 2023-12-14 DIAGNOSIS — I2510 Atherosclerotic heart disease of native coronary artery without angina pectoris: Secondary | ICD-10-CM | POA: Insufficient documentation

## 2023-12-14 DIAGNOSIS — Z7982 Long term (current) use of aspirin: Secondary | ICD-10-CM | POA: Diagnosis not present

## 2023-12-14 DIAGNOSIS — Z7902 Long term (current) use of antithrombotics/antiplatelets: Secondary | ICD-10-CM | POA: Insufficient documentation

## 2023-12-14 DIAGNOSIS — R42 Dizziness and giddiness: Secondary | ICD-10-CM | POA: Diagnosis present

## 2023-12-14 DIAGNOSIS — D696 Thrombocytopenia, unspecified: Secondary | ICD-10-CM | POA: Insufficient documentation

## 2023-12-14 DIAGNOSIS — I10 Essential (primary) hypertension: Secondary | ICD-10-CM | POA: Insufficient documentation

## 2023-12-14 DIAGNOSIS — R7401 Elevation of levels of liver transaminase levels: Secondary | ICD-10-CM | POA: Insufficient documentation

## 2023-12-14 DIAGNOSIS — R000 Tachycardia, unspecified: Secondary | ICD-10-CM | POA: Insufficient documentation

## 2023-12-14 DIAGNOSIS — Z79899 Other long term (current) drug therapy: Secondary | ICD-10-CM | POA: Insufficient documentation

## 2023-12-14 LAB — CBC WITH DIFFERENTIAL/PLATELET
Abs Immature Granulocytes: 0.01 10*3/uL (ref 0.00–0.07)
Basophils Absolute: 0 10*3/uL (ref 0.0–0.1)
Basophils Relative: 1 %
Eosinophils Absolute: 0.1 10*3/uL (ref 0.0–0.5)
Eosinophils Relative: 3 %
HCT: 35.2 % — ABNORMAL LOW (ref 39.0–52.0)
Hemoglobin: 12.2 g/dL — ABNORMAL LOW (ref 13.0–17.0)
Immature Granulocytes: 0 %
Lymphocytes Relative: 40 %
Lymphs Abs: 1.2 10*3/uL (ref 0.7–4.0)
MCH: 36.4 pg — ABNORMAL HIGH (ref 26.0–34.0)
MCHC: 34.7 g/dL (ref 30.0–36.0)
MCV: 105.1 fL — ABNORMAL HIGH (ref 80.0–100.0)
Monocytes Absolute: 0.4 10*3/uL (ref 0.1–1.0)
Monocytes Relative: 12 %
Neutro Abs: 1.3 10*3/uL — ABNORMAL LOW (ref 1.7–7.7)
Neutrophils Relative %: 44 %
Platelets: 80 10*3/uL — ABNORMAL LOW (ref 150–400)
RBC: 3.35 MIL/uL — ABNORMAL LOW (ref 4.22–5.81)
RDW: 13.9 % (ref 11.5–15.5)
WBC: 2.9 10*3/uL — ABNORMAL LOW (ref 4.0–10.5)
nRBC: 0 % (ref 0.0–0.2)

## 2023-12-14 LAB — COMPREHENSIVE METABOLIC PANEL
ALT: 67 U/L — ABNORMAL HIGH (ref 0–44)
AST: 148 U/L — ABNORMAL HIGH (ref 15–41)
Albumin: 3.5 g/dL (ref 3.5–5.0)
Alkaline Phosphatase: 69 U/L (ref 38–126)
Anion gap: 14 (ref 5–15)
BUN: 23 mg/dL (ref 8–23)
CO2: 24 mmol/L (ref 22–32)
Calcium: 9.5 mg/dL (ref 8.9–10.3)
Chloride: 100 mmol/L (ref 98–111)
Creatinine, Ser: 0.86 mg/dL (ref 0.61–1.24)
GFR, Estimated: >60 mL/min (ref >60)
Glucose, Bld: 125 mg/dL — ABNORMAL HIGH (ref 70–99)
Potassium: 3.5 mmol/L (ref 3.5–5.1)
Sodium: 138 mmol/L (ref 135–145)
Total Bilirubin: 1 mg/dL (ref 0.0–1.2)
Total Protein: 6.5 g/dL (ref 6.5–8.1)

## 2023-12-14 LAB — URINALYSIS, W/ REFLEX TO CULTURE (INFECTION SUSPECTED)
Bilirubin Urine: NEGATIVE
Glucose, UA: 100 mg/dL — AB
Ketones, ur: NEGATIVE mg/dL
Leukocytes,Ua: NEGATIVE
Nitrite: NEGATIVE
Protein, ur: NEGATIVE mg/dL
Specific Gravity, Urine: 1.01 (ref 1.005–1.030)
pH: 7 (ref 5.0–8.0)

## 2023-12-14 LAB — BRAIN NATRIURETIC PEPTIDE: B Natriuretic Peptide: 97.5 pg/mL (ref 0.0–100.0)

## 2023-12-14 LAB — D-DIMER, QUANTITATIVE: D-Dimer, Quant: 0.62 ug{FEU}/mL — ABNORMAL HIGH (ref 0.00–0.50)

## 2023-12-14 LAB — TROPONIN I (HIGH SENSITIVITY): Troponin I (High Sensitivity): 9 ng/L (ref <18)

## 2023-12-14 LAB — LACTIC ACID, PLASMA
Lactic Acid, Venous: 2.9 mmol/L (ref 0.5–1.9)
Lactic Acid, Venous: 1.7 mmol/L (ref 0.5–1.9)

## 2023-12-14 LAB — LIPASE, BLOOD: Lipase: 36 U/L (ref 11–51)

## 2023-12-14 LAB — MAGNESIUM: Magnesium: 1.5 mg/dL — ABNORMAL LOW (ref 1.7–2.4)

## 2023-12-14 MED ORDER — LACTATED RINGERS IV BOLUS
1000.0000 mL | Freq: Once | INTRAVENOUS | Status: AC
  Administered 2023-12-14: 1000 mL via INTRAVENOUS

## 2023-12-14 MED ORDER — MAGNESIUM SULFATE 2 GM/50ML IV SOLN
2.0000 g | Freq: Once | INTRAVENOUS | Status: AC
  Administered 2023-12-14: 2 g via INTRAVENOUS
  Filled 2023-12-14: qty 50

## 2023-12-14 MED ORDER — IOHEXOL 350 MG/ML SOLN
75.0000 mL | Freq: Once | INTRAVENOUS | Status: AC | PRN
  Administered 2023-12-14: 75 mL via INTRAVENOUS

## 2023-12-14 NOTE — ED Provider Notes (Signed)
 Patient was not too keen about admission.  I did give him magnesium for low magnesium.  CT angio chest without any acute findings.  Patient was ambulated with pulse ox and also did orthostatics again after the IV fluids here and he had no orthostatic hypotension and had no hypoxia and tolerated the ambulation fine.  Based on this technically patient could be discharged.  His lactic acid after the fluids to come down to 1.7.  White count is a little low at 2.9 and his absolute neutrophil count was down some at 1.3 but not in danger zone and platelets were 80K.  Also not a danger zone we will have to be followed carefully.  Patient states he will follow-up with his primary care doctor to have these things rechecked and will work on hydrating himself.   Vanetta Mulders, MD 12/14/23 [id number]

## 2023-12-14 NOTE — ED Notes (Signed)
 Ambulated pt under own power around nursing station, SpO2 92-94% and HR 93-98, but had a HR of 113 upon reentering room and sitting on side of bed. Had no issues with ambulation, but noted being 'slightly dizzy, due to laying in bed for so long."

## 2023-12-14 NOTE — ED Provider Notes (Signed)
 [facility] Provider Note   CSN: [id number] Arrival date & time: 12/14/23  1000     History  No chief complaint on file.   Jeremiah Short is a 63 y.o. male.  Patient is a 63 year old male with a past medical history of achalasia, CAD, hypertension and GERD presenting to the emergency department for dizziness and hypotension.  The patient reports that over the last 2 to 3 weeks he has had increasing lightheadedness and dizziness upon standing.  He states that he has felt generally weak and fatigued.  He states that he has been checking his blood pressure at home and it has been low in the 80s.  He states he did not take his blood pressure medication this morning but had previously been taking it as prescribed. He reports mild associated chest tightness that comes and goes. Currently pain free and no abdominal pain. He reports occasional vomiting when he eats or drinks too fast but no nausea, diarrhea or constipation. He denies any fever or cough. Reports associated SOB.  The history is provided by the patient.       Home Medications Prior to Admission medications   Medication Sig Start Date End Date Taking? Authorizing Provider  albuterol (VENTOLIN HFA) 108 (90 Base) MCG/ACT inhaler Inhale 1-2 puffs into the lungs every 6 (six) hours as needed for wheezing or shortness of breath. 08/01/23  Yes Everrett Coombe, DO  aspirin EC 81 MG EC tablet Take 1 tablet (81 mg total) by mouth daily. 09/30/16  Yes Kilroy, Luke K, PA-C  atorvastatin (LIPITOR) 40 MG tablet Take 1 tablet (40 mg total) by mouth daily. 05/10/23  Yes Everrett Coombe, DO  clopidogrel (PLAVIX) 75 MG tablet Take 1 tablet (75 mg total) by mouth daily. 12/06/23  Yes Jake Bathe, MD  Cyanocobalamin (VITAMIN B-12) 2500 MCG SUBL Take 2,500 mcg by mouth every evening.   Yes [provider]  dapagliflozin propanediol (FARXIGA) 10 MG TABS tablet Take 1 tablet (10 mg total) by mouth daily  before breakfast. 12/12/23  Yes Jake Bathe, MD  isosorbide mononitrate (IMDUR) 60 MG 24 hr tablet TAKE 1 TABLET BY MOUTH EVERY DAY 10/30/22  Yes Jake Bathe, MD  lansoprazole (PREVACID) 30 MG capsule TAKE 1 CAPSULE BY MOUTH 2 TIMES DAILY BEFORE A MEAL FOR 1 MONTH THEN BACK TO ONCE DAILY. 03/07/23  Yes Mansouraty, Netty Starring., MD  Multiple Vitamin (MULTIVITAMIN WITH MINERALS) TABS tablet Take 1 tablet by mouth every evening.   Yes [provider]  nitroGLYCERIN (NITROSTAT) 0.4 MG SL tablet PLACE 1 TABLET UNDER THE TONGUE EVERY 5 MINUTES FOR 3 DOSES AS NEEDED FOR CHEST PAIN 08/08/23  Yes Jake Bathe, MD  sacubitril-valsartan (ENTRESTO) 24-26 MG Take 1 tablet by mouth 2 (two) times daily. Patient taking differently: Take 2 tablets by mouth daily. 11/08/23  Yes Jake Bathe, MD  spironolactone (ALDACTONE) 25 MG tablet TAKE 1/2 TABLET BY MOUTH EVERY DAY 02/12/23  Yes Gaston Islam., NP  thiamine 100 MG tablet Take 1 tablet (100 mg total) by mouth daily. Patient taking differently: Take 100 mg by mouth every evening. 03/02/21  Yes Standley Brooking, MD  metoprolol succinate (TOPROL-XL) 25 MG 24 hr tablet TAKE 1 TABLET (25 MG TOTAL) BY MOUTH DAILY. Patient not taking: Reported on 12/14/2023 08/20/23   Jake Bathe, MD  sucralfate (CARAFATE) 1 GM/10ML suspension TAKE 10 MLS (1 G TOTAL) BY MOUTH 2 (TWO) TIMES DAILY. *NOT  COVERED Patient not taking: Reported on 12/14/2023 01/29/23   Mansouraty, Netty Starring., MD      Allergies    Morphine and codeine    Review of Systems   Review of Systems  Physical Exam Updated Vital Signs BP 102/83   Pulse (!) 103   Temp 97.9 F (36.6 C)   Resp 19   SpO2 95%  Physical Exam Vitals and nursing note reviewed.  Constitutional:      General: He is not in acute distress.    Appearance: Normal appearance.  HENT:     Head: Normocephalic and atraumatic.     Nose: Nose normal.     Mouth/Throat:     Mouth: Mucous membranes are dry.     Pharynx:  Oropharynx is clear.  Eyes:     Extraocular Movements: Extraocular movements intact.     Conjunctiva/sclera: Conjunctivae normal.  Cardiovascular:     Rate and Rhythm: Regular rhythm. Tachycardia present.     Heart sounds: Normal heart sounds.  Pulmonary:     Effort: Pulmonary effort is normal.     Breath sounds: Normal breath sounds.  Abdominal:     General: Abdomen is flat.     Palpations: Abdomen is soft.     Tenderness: There is no abdominal tenderness.  Musculoskeletal:        General: Normal range of motion.     Cervical back: Normal range of motion.     Right lower leg: No edema.     Left lower leg: No edema.  Skin:    General: Skin is warm and dry.  Neurological:     General: No focal deficit present.     Mental Status: He is alert and oriented to person, place, and time.  Psychiatric:        Mood and Affect: Mood normal.        Behavior: Behavior normal.     ED Results / Procedures / Treatments   Labs (all labs ordered are listed, but only abnormal results are displayed) Labs Reviewed  CBC WITH DIFFERENTIAL/PLATELET - Abnormal; Notable for the following components:      Result Value   WBC 2.9 (*)    RBC 3.35 (*)    Hemoglobin 12.2 (*)    HCT 35.2 (*)    MCV 105.1 (*)    MCH 36.4 (*)    Platelets 80 (*)    Neutro Abs 1.3 (*)    All other components within normal limits  COMPREHENSIVE METABOLIC PANEL - Abnormal; Notable for the following components:   Glucose, Bld 125 (*)    AST 148 (*)    ALT 67 (*)    All other components within normal limits  MAGNESIUM - Abnormal; Notable for the following components:   Magnesium 1.5 (*)    All other components within normal limits  D-DIMER, QUANTITATIVE - Abnormal; Notable for the following components:   D-Dimer, Quant 0.62 (*)    All other components within normal limits  LACTIC ACID, PLASMA - Abnormal; Notable for the following components:   Lactic Acid, Venous 2.9 (*)    All other components within normal limits   URINALYSIS, W/ REFLEX TO CULTURE (INFECTION SUSPECTED) - Abnormal; Notable for the following components:   Glucose, UA 100 (*)    Hgb urine dipstick TRACE (*)    Bacteria, UA RARE (*)    All other components within normal limits  LIPASE, BLOOD  LACTIC ACID, PLASMA  BRAIN NATRIURETIC PEPTIDE  TROPONIN I (HIGH  SENSITIVITY)    EKG None  Radiology DG Chest Port 1 View Result Date: 12/14/2023 CLINICAL DATA:  Shortness of breath EXAM: PORTABLE CHEST 1 VIEW COMPARISON:  X-ray 11/25/2021.  CT lung cancer screening 03/02/2022 FINDINGS: No consolidation, pneumothorax or effusion. Normal cardiopericardial silhouette. No edema. Curvature of spine. Overlapping cardiac leads. IMPRESSION: No acute cardiopulmonary disease. Electronically Signed   By: Karen Kays M.D.   On: 12/14/2023 12:55   US Abdomen Limited RUQ (LIVER/GB) Result Date: 12/14/2023 CLINICAL DATA:  Elevated liver function tests. Hypotension. Prior cholecystectomy EXAM: ULTRASOUND ABDOMEN LIMITED RIGHT UPPER QUADRANT COMPARISON:  Ultrasound 02/23/2021.  Older exams as well FINDINGS: Gallbladder: Previous cholecystectomy. Common bile duct: Diameter: 4 mm Liver: Diffusely echogenic hepatic parenchyma consistent with fatty liver infiltration. With this level of echogenicity evaluation for underlying mass lesion is limited and if needed follow-up contrast CT or MRI as clinically appropriate. Portal vein is patent on color Doppler imaging with normal direction of blood flow towards the liver. Other: None. IMPRESSION: Previous cholecystectomy.  No ductal dilatation. Fatty liver infiltration. Electronically Signed   By: Karen Kays M.D.   On: 12/14/2023 12:55    Procedures Procedures    Medications Ordered in ED Medications  lactated ringers bolus 1,000 mL (0 mLs Intravenous Stopped 12/14/23 1211)  lactated ringers bolus 1,000 mL (0 mLs Intravenous Stopped 12/14/23 1336)  iohexol (OMNIPAQUE) 350 MG/ML injection 75 mL (75 mLs Intravenous  Contrast Given 12/14/23 1345)    ED Course/ Medical Decision Making/ A&P Clinical Course as of 12/14/23 1516  Fri Dec 14, 2023  1124 Labs with mild leukopenia and thrombocytopenia compared to prior labs. Mild hypomagnesemia will be repleted. Mild transaminitis. Will have RUQ [place] to evaluate for liver dysfunction. Troponin negative, symptoms ongoing for several weeks so single troponin is sufficient. [VK]  1129 Patient has ~2/3 of his fluids and is still tachycardic with BP in the 90s, will also add on d-dimer and lactic to further evaluate for cause of hypotension without severe dehydration on labs. Patient does report he is a daily drinker, never had ETOH withdrawal before. [VK]  1208 D-dimer negative for age adjustment making PE less likely but is starting to desat, will place on [place] and have CTPE study. Will give additional fluids for continued soft Bps. [VK]  1405 Rpt lactic normalized with fluids. [VK]  1514 Patient signed out to Dr. Deretha Emory pending CTPE read, suspect will need admission for hypotension and limited PO intake. [VK]    Clinical Course User Index [VK] Rexford Maus, DO                                 Medical Decision Making This patient presents to the ED with chief complaint(s) of dizziness, hypotension with pertinent past medical history of achalasia, GERD, CAD, HTN which further complicates the presenting complaint. The complaint involves an extensive differential diagnosis and also carries with it a high risk of complications and morbidity.    The differential diagnosis includes dehydration, electrolyte abnormality, orthostatic hypotension, arrhythmia, anemia, atypical ACS, sepsis  Additional history obtained: Additional history obtained from N/A Records reviewed previous admission documents  ED Course and Reassessment: On patient's arrival he is tachycardic otherwise normotensive and in no acute distress.  EKG showed sinus tachycardia without acute ischemic  changes.  Patient does appear dry on exam will be started on IV fluids and will have labs including troponin and electrolytes performed and will be  closely reassessed.  Independent labs interpretation:  The following labs were independently interpreted: elevated lactic -> normalized on repeat, transaminitis and thrombocytopenia/mild leukopenia  Independent visualization of imaging: - I independently visualized the following imaging with scope of interpretation limited to determining acute life threatening conditions related to emergency care: CXR, RUQ [place], which revealed no acute disease    Amount and/or Complexity of Data Reviewed Labs: ordered. Radiology: ordered.  Risk Prescription drug management.          Final Clinical Impression(s) / ED Diagnoses Final diagnoses:  Hypotension, unspecified hypotension type  Thrombocytopenia (HCC)  Transaminitis    Rx / DC Orders ED Discharge Orders     None         Rexford Maus, DO 12/14/23 1516

## 2023-12-14 NOTE — ED Triage Notes (Signed)
 Pt states for the last several days he has been having increasing malaise, dizziness, and DOE. Pt states that at home he's been taking his BP and had several low readings including low of 87/68. Pt on metoprolol and imdur daily without any missed doses.

## 2023-12-14 NOTE — Discharge Instructions (Signed)
 Follow-up with your primary care doctor for recheck of your white blood cell count and platelets.  And for liver function test.  Return for any new or worse symptoms.  Continue to hydrate yourself well.

## 2023-12-24 ENCOUNTER — Telehealth: Payer: Self-pay | Admitting: Cardiology

## 2023-12-24 NOTE — Telephone Encounter (Signed)
 Pt c/o BP issue:  1. What are your last 5 BP readings? 83/62, 85/72   2. Are you having any other symptoms (ex. Dizziness, headache, blurred vision, passed out)? Dizziness  3. What is your medication issue? Blood pressure is running low-patient said he went to [facility] on 12-14-23 . Patient wants to be seen please

## 2023-12-24 NOTE — Telephone Encounter (Signed)
 Patient reports that he has been having low blood pressure for the past couple of weeks. His systolic will get down into the 80s. It will come up to 115 at times but is averaging in the 90s. Diastolic is staying 60-70s. He states that he does get lightheaded when he gets up at times. He states that he is stopping his metoprolol. He continues to take imdur and spironolactone. He states that he went to the ER recently for low blood pressure and they told him he was dehydrated. He is aware to make sure that he is drinking enough fluids and changing positions slowly. He has a yearly follow up with Dr. Anne Fu scheduled for 3/27.

## 2023-12-25 NOTE — Telephone Encounter (Signed)
 Spoke with the patient and advised on recommendations from Dr. Anne Fu. Patient verbalized understanding.

## 2023-12-27 ENCOUNTER — Other Ambulatory Visit: Payer: Self-pay | Admitting: Cardiology

## 2023-12-27 ENCOUNTER — Ambulatory Visit: Attending: Cardiology | Admitting: Cardiology

## 2023-12-27 ENCOUNTER — Encounter: Payer: Self-pay | Admitting: Cardiology

## 2023-12-27 VITALS — BP 116/78 | HR 89 | Ht 70.0 in | Wt 153.6 lb

## 2023-12-27 DIAGNOSIS — I5042 Chronic combined systolic (congestive) and diastolic (congestive) heart failure: Secondary | ICD-10-CM

## 2023-12-27 DIAGNOSIS — I25119 Atherosclerotic heart disease of native coronary artery with unspecified angina pectoris: Secondary | ICD-10-CM

## 2023-12-27 DIAGNOSIS — E7800 Pure hypercholesterolemia, unspecified: Secondary | ICD-10-CM

## 2023-12-27 DIAGNOSIS — F1090 Alcohol use, unspecified, uncomplicated: Secondary | ICD-10-CM

## 2023-12-27 DIAGNOSIS — I10 Essential (primary) hypertension: Secondary | ICD-10-CM

## 2023-12-27 MED ORDER — CLOPIDOGREL BISULFATE 75 MG PO TABS: 75.0000 mg | ORAL_TABLET | Freq: Every day | ORAL | 3 refills | Status: AC

## 2023-12-27 MED ORDER — NITROGLYCERIN 0.4 MG SL SUBL: SUBLINGUAL_TABLET | SUBLINGUAL | 0 refills | Status: AC

## 2023-12-27 MED ORDER — DAPAGLIFLOZIN PROPANEDIOL 10 MG PO TABS: 10.0000 mg | ORAL_TABLET | Freq: Every day | ORAL | 3 refills | Status: AC

## 2023-12-27 NOTE — Patient Instructions (Signed)
 Medication Instructions:  STOP Entresto STOP Metoprolol  STOP Spironolactone   Refilled Plavix  Refilled Farxiga Refilled Nitroglycerin   *If you need a refill on your cardiac medications before your next appointment, please call your pharmacy*  Follow-Up: At [facility], you and your health needs are our priority.  As part of our continuing mission to provide you with exceptional heart care, we have created designated Provider Care Teams.  These Care Teams include your primary Cardiologist (physician) and Advanced Practice Providers (APPs -  Physician Assistants and Nurse Practitioners) who all work together to provide you with the care you need, when you need it.   Your next appointment:   3 month(s)  Provider:   Jari Favre, PA-C, Ronie Spies, PA-C, Robin Searing, NP, Jacolyn Reedy, PA-C, Eligha Bridegroom, NP, Tereso Newcomer, PA-C, or Perlie Gold, PA-C      Other Instructions   1st Floor: - Lobby - Registration  - Pharmacy  - Lab - Cafe  2nd Floor: - PV Lab - Diagnostic Testing (echo, CT, nuclear med)  3rd Floor: - Vacant  4th Floor: - TCTS (cardiothoracic surgery) - AFib Clinic - Structural Heart Clinic - Vascular Surgery  - Vascular Ultrasound  5th Floor: - HeartCare Cardiology (general and EP) - Clinical Pharmacy for coumadin, hypertension, lipid, weight-loss medications, and med management appointments    Valet parking services will be available as well.

## 2023-12-27 NOTE — Progress Notes (Unsigned)
 Cardiology Office Note:  .   Date:  12/27/2023  ID:  Jeremiah Short, DOB 25-Jan-1961, MRN [id number] PCP: Everrett Coombe, DO  [facility] Providers Cardiologist:  Donato Schultz, MD     History of Present Illness: [name]   Jeremiah Short is a 63 y.o. male Discussed the use of AI scribe software for clinical note transcription with the patient, who gave verbal consent to proceed.  History of Present Illness Jeremiah Short is a 63 year old male with hypotension who presents for follow-up.  He has been experiencing low blood pressure readings at home, with recent measurements of 83/62 mmHg over the past couple of weeks, leading to lightheadedness upon standing. He had previously stopped taking metoprolol and continues to take isosorbide and spironolactone, but these medications were discontinued due to hypotension. His blood pressure without medications was 107 mmHg, and today in the clinic, it is 116/78 mmHg. He visited the emergency department on December 14, 2023, due to hypotension.  He has a history of coronary artery disease with drug-eluting stents placed in the LAD in 2009 and 2017, chronic combined systolic and diastolic heart failure, aortic atherosclerosis, hyperlipidemia, COPD, Barrett's esophagus, and a history of tobacco use. He had a prior anterior myocardial infarction with mid LAD occlusion and fusion proximal to the previously placed LAD stent in 2017. His most recent Myovue in 2023 showed no ischemia, and an echocardiogram on June 21, 2023, showed an ejection fraction of 40-45%, improved from a previous 25-30%.  He has a history of alcohol use, specifically vodka, which he has stopped for the past week to improve hydration. He acknowledges previous dehydration and fluid retention, noting that his foot and ankle would become puffy, but this has not been an issue in the past week. Just got a job recently. Was out of work for 8 months.  He has been out of work for eight months, leading  to decreased physical activity and muscle mass loss, but he recently started a new job, which he hopes will increase his activity level. His current medications include Farxiga 10 mg. His creatinine is 0.86, and LDL is 67. He continues to monitor his blood pressure at home.      ROS: No syncope  Studies Reviewed: .        Results LABS Creatinine: 0.86 LDL: 67 Hemoglobin A1c: 5.4  DIAGNOSTIC Echocardiogram: Ejection fraction of 40-45% (06/21/2023) Risk Assessment/Calculations:            Physical Exam:   VS:  BP 116/78   Pulse 89   Ht 5\' 10"  (1.778 m)   Wt 153 lb 9.6 oz (69.7 kg)   SpO2 99%   BMI 22.04 kg/m    Wt Readings from Last 3 Encounters:  12/27/23 153 lb 9.6 oz (69.7 kg)  02/13/23 154 lb (69.9 kg)  12/21/22 153 lb (69.4 kg)    GEN: Thin in no acute distress NECK: No JVD; No carotid bruits CARDIAC: RRR, no murmurs, no rubs, no gallops RESPIRATORY:  Clear to auscultation without rales, wheezing or rhonchi  ABDOMEN: Soft, non-tender, non-distended EXTREMITIES:  No edema; No deformity   ASSESSMENT AND PLAN: .    Assessment and Plan Assessment & Plan Chronic Heart Failure with Reduced Ejection Fraction (HFrEF) Chronic combined systolic and diastolic heart failure with a previously recorded ejection fraction of 25-30%. Recent echocardiogram on 06/21/23 showed improvement to 40-45%. Unable to tolerate certain goal-directed medical therapies like Entresto. Currently on Farxiga for fluid management, one of the four main  medication types for heart failure. - Continue Farxiga 10 mg daily. - Monitor for signs of fluid overload or dehydration. - Encourage cessation of alcohol to prevent further cardiac damage.  Hypotension Experiencing hypotension with home blood pressure readings as low as 83/62 mmHg, causing lightheadedness upon standing. Likely exacerbated by dehydration and alcohol consumption. Recent cessation of alcohol and rehydration efforts have been initiated.  Current blood pressure in the clinic is 116/78 mmHg, showing improvement. Decision made to hold certain medications due to low blood pressure readings. Marcelline Deist is continued for heart failure and fluid management. - Continue monitoring blood pressure at home. - Hold isosorbide, spironolactone, metoprolol, and Entresto due to low blood pressure. - Encourage rehydration and cessation of alcohol consumption. - Schedule follow-up in 3 months to reassess blood pressure and medication needs. May be able to reintroduce another medication back. If BP are too low, continue with current plan.   Coronary Artery Disease (CAD) Coronary artery disease with drug-eluting stents placed in the LAD in 2009 and 2017. Prior anterior MI with mid LAD occlusion. Most recent Myovue in 2023 showed no ischemia. Currently on aspirin, atorvastatin, and Plavix for CAD management. - Continue aspirin 81 mg daily. - Continue atorvastatin. - Refill Plavix 75 mg.  Hyperlipidemia Hyperlipidemia with an LDL of 67 mg/dL, managed with atorvastatin. - Continue atorvastatin.         Signed, Donato Schultz, MD

## 2023-12-31 ENCOUNTER — Other Ambulatory Visit (HOSPITAL_COMMUNITY): Payer: Self-pay

## 2023-12-31 ENCOUNTER — Telehealth: Payer: Self-pay | Admitting: Pharmacy Technician

## 2023-12-31 NOTE — Telephone Encounter (Signed)
 Ran test claim for farxiga. For a 30 day supply and the co-pay is 15.00. PA is not needed at this time. Nothing saying this is a transition fill. This test claim was processed through [facility]- copay amounts may vary at other pharmacies due to pharmacy/plan contracts, or as the patient moves through the different stages of their insurance plan.     I called and talked to [facility] and they filled it for 15.00

## 2024-01-06 ENCOUNTER — Other Ambulatory Visit: Payer: Self-pay | Admitting: Cardiology

## 2024-01-06 DIAGNOSIS — R0789 Other chest pain: Secondary | ICD-10-CM

## 2024-01-06 DIAGNOSIS — I2510 Atherosclerotic heart disease of native coronary artery without angina pectoris: Secondary | ICD-10-CM

## 2024-02-29 ENCOUNTER — Encounter: Payer: Self-pay | Admitting: Family Medicine

## 2024-02-29 ENCOUNTER — Ambulatory Visit (INDEPENDENT_AMBULATORY_CARE_PROVIDER_SITE_OTHER): Admitting: Family Medicine

## 2024-02-29 VITALS — BP 125/90 | HR 78 | Ht 70.0 in | Wt 150.0 lb

## 2024-02-29 DIAGNOSIS — E7800 Pure hypercholesterolemia, unspecified: Secondary | ICD-10-CM | POA: Diagnosis not present

## 2024-02-29 DIAGNOSIS — Z23 Encounter for immunization: Secondary | ICD-10-CM | POA: Diagnosis not present

## 2024-02-29 DIAGNOSIS — F1090 Alcohol use, unspecified, uncomplicated: Secondary | ICD-10-CM

## 2024-02-29 DIAGNOSIS — Z0000 Encounter for general adult medical examination without abnormal findings: Secondary | ICD-10-CM | POA: Diagnosis not present

## 2024-02-29 DIAGNOSIS — Z125 Encounter for screening for malignant neoplasm of prostate: Secondary | ICD-10-CM

## 2024-02-29 DIAGNOSIS — R7989 Other specified abnormal findings of blood chemistry: Secondary | ICD-10-CM | POA: Diagnosis not present

## 2024-02-29 MED ORDER — ALBUTEROL SULFATE HFA 108 (90 BASE) MCG/ACT IN AERS: 1.0000 | INHALATION_SPRAY | Freq: Four times a day (QID) | RESPIRATORY_TRACT | 2 refills | Status: DC | PRN

## 2024-02-29 NOTE — Patient Instructions (Signed)

## 2024-02-29 NOTE — Progress Notes (Signed)
 Jeremiah Short - 63 y.o. male MRN [id number]  Date of birth: 09-05-1961  Subjective Chief Complaint  Patient presents with   Annual Exam    HPI Jeremiah Short is a 63 y.o. male here today for annual exam.   He reports that he is doing pretty well.    He is moderately active and feels that his diet is pretty good.    He does continue to smoke.  He does consume EtOH on a regular basis.   Review of Systems  Constitutional:  Negative for chills, fever, malaise/fatigue and weight loss.  HENT:  Negative for congestion, ear pain and sore throat.   Eyes:  Negative for blurred vision, double vision and pain.  Respiratory:  Negative for cough and shortness of breath.   Cardiovascular:  Negative for chest pain and palpitations.  Gastrointestinal:  Negative for abdominal pain, blood in stool, constipation, heartburn and nausea.  Genitourinary:  Negative for dysuria and urgency.  Musculoskeletal:  Negative for joint pain and myalgias.  Neurological:  Negative for dizziness and headaches.  Endo/Heme/Allergies:  Does not bruise/bleed easily.  Psychiatric/Behavioral:  Negative for depression. The patient is not nervous/anxious and does not have insomnia.     Allergies  Allergen Reactions   Morphine And Codeine Other (See Comments)    dont remember the symptoms. Has tolerated dilaudid  and oxycodone     Past Medical History:  Diagnosis Date   Anxiety    Aortic atherosclerosis (HCC) 02/24/2021   Barrett's esophagus    Cardiomyopathy    Echocardiogram 10/23: EF 25-30, normal RVSF, mild MR, mild dilation of aortic root 41 mm, mild dilation of asc aorta 41 mm, RAP 8   Closed compression fracture of body of L1 vertebra (HCC) 02/24/2021   Coronary artery disease    Diverticulosis    Emphysema lung (HCC) 02/24/2021   Esophageal stricture    GERD (gastroesophageal reflux disease)    Hiatal hernia    History of ETOH abuse    Hypertension    Myocardial infarction [facility]) 2009   Pure  hypercholesterolemia    Smoker    Stented coronary artery    Tubular adenoma of colon 2012   Wears glasses     Past Surgical History:  Procedure Laterality Date   APPENDECTOMY  10/02/2009   during colectomy   BOTOX  INJECTION  12/21/2022   Procedure: BOTOX  INJECTION;  Surgeon: Normie Becton., MD;  Location: [facility];  Service: Gastroenterology;;   CARDIAC CATHETERIZATION  10/03/2007   placed 2 stents   CARDIAC CATHETERIZATION N/A 09/27/2016   Procedure: Left Heart Cath and Coronary Angiography;  Surgeon: Knox Perl, MD;  Location: [facility] INVASIVE CV LAB;  Service: Cardiovascular;  Laterality: N/A;   CARDIAC CATHETERIZATION N/A 09/27/2016   Procedure: Coronary Stent Intervention;  Surgeon: Knox Perl, MD;  Location: [facility] INVASIVE CV LAB;  Service: Cardiovascular;  Laterality: N/A;   CHOLECYSTECTOMY N/A 02/27/2021   Procedure: LAPAROSCOPIC CHOLECYSTECTOMY;  Surgeon: Sim Dryer, MD;  Location: [facility];  Service: General;  Laterality: N/A;   ESOPHAGEAL MANOMETRY N/A 10/11/2022   Procedure: ESOPHAGEAL MANOMETRY (EM);  Surgeon: Normie Becton., MD;  Location: [facility];  Service: Gastroenterology;  Laterality: N/A;   ESOPHAGOGASTRODUODENOSCOPY (EGD) WITH PROPOFOL  N/A 12/21/2022   Procedure: ESOPHAGOGASTRODUODENOSCOPY (EGD) WITH PROPOFOL ;  Surgeon: Brice Campi Albino Alu., MD;  Location: [facility];  Service: Gastroenterology;  Laterality: N/A;   EUS N/A 12/21/2022   Procedure: UPPER ENDOSCOPIC ULTRASOUND (EUS) RADIAL;  Surgeon: Brice Campi Albino Alu., MD;  Location: [facility];  Service:  Gastroenterology;  Laterality: N/A;   EVALUATION UNDER ANESTHESIA WITH HEMORRHOIDECTOMY N/A 10/04/2022   Procedure: ANORECTAL EXAM UNDER ANESTHESIA WITH HEMORRHOIDECTOMY WITH LIGATION AND HEMORRHOIDOPEXY;  Surgeon: Candyce Champagne, MD;  Location: [facility];  Service: General;  Laterality: N/A;  GEN AND LOCAL   INGUINAL HERNIA REPAIR Left 09/16/2013   Procedure: HERNIA REPAIR INGUINAL ADULT;   Surgeon: Brandy Cal. Cornett, MD;  Location: [facility];  Service: General;  Laterality: Left;   INGUINAL HERNIA REPAIR Left 09/16/2013   Dr Afton Horse   MOUTH SURGERY     RIGHT/LEFT HEART CATH AND CORONARY ANGIOGRAPHY N/A 08/01/2022   Procedure: RIGHT/LEFT HEART CATH AND CORONARY ANGIOGRAPHY;  Surgeon: Arnoldo Lapping, MD;  Location: [facility] INVASIVE CV LAB;  Service: Cardiovascular;  Laterality: N/A;   SAVORY DILATION N/A 12/21/2022   Procedure: SAVORY DILATION;  Surgeon: Brice Campi Albino Alu., MD;  Location: [facility];  Service: Gastroenterology;  Laterality: N/A;   SCLEROTHERAPY  12/21/2022   Procedure: SCLEROTHERAPY;  Surgeon: Brice Campi Albino Alu., MD;  Location: [facility];  Service: Gastroenterology;;   SIGMOIDECTOMY  10/28/2009   Dr Toniann Franklin.  Diveetriculitis w prior abscess   TONSILLECTOMY     WISDOM TOOTH EXTRACTION      Social History   Socioeconomic History   Marital status: Divorced    Spouse name: Not on file   Number of children: 2   Years of education: Not on file   Highest education level: Not on file  Occupational History   Occupation: Location manager  Tobacco Use   Smoking status: Every Day    Current packs/day: 0.75    Average packs/day: 0.8 packs/day for 40.0 years (30.0 ttl pk-yrs)    Types: Cigarettes   Smokeless tobacco: Never   Tobacco comments:    Pt given handout to quit smoking  Vaping Use   Vaping status: Never Used  Substance and Sexual Activity   Alcohol  use: Not Currently    Alcohol /week: 5.0 standard drinks of alcohol     Types: 5 Shots of liquor per week    Comment: daily   Drug use: No   Sexual activity: Not on file  Other Topics Concern   Not on file  Social History Narrative   Not on file   Social Drivers of Health   Financial Resource Strain: Low Risk  (02/29/2024)   Overall Financial Resource Strain (CARDIA)    Difficulty of Paying Living Expenses: Not very hard  Food Insecurity: No Food Insecurity (02/29/2024)    Hunger Vital Sign    Worried About Running Out of Food in the Last Year: Never true    Ran Out of Food in the Last Year: Never true  Transportation Needs: No Transportation Needs (02/29/2024)   PRAPARE - Administrator, Civil Service (Medical): No    Lack of Transportation (Non-Medical): No  Physical Activity: Inactive (02/29/2024)   Exercise Vital Sign    Days of Exercise per Week: 0 days    Minutes of Exercise per Session: 0 min  Stress: No Stress Concern Present (02/29/2024)   [employer] of Occupational Health - Occupational Stress Questionnaire    Feeling of Stress : Only a little  Social Connections: Unknown (02/13/2022)   Received from [facility]   Social Network    Social Network: Not on file    Family History  Problem Relation Age of Onset   Lung cancer Mother    Heart disease Father        has pacemaker  Breast cancer Sister    Colon cancer Neg Hx    Colon polyps Neg Hx    Esophageal cancer Neg Hx    Kidney disease Neg Hx    Diabetes Neg Hx    Stomach cancer Neg Hx    Rectal cancer Neg Hx    Inflammatory bowel disease Neg Hx    Liver disease Neg Hx    Pancreatic cancer Neg Hx     Health Maintenance  Topic Date Due   COVID-19 Vaccine (4 - 2024-25 season) 06/03/2023   INFLUENZA VACCINE  05/02/2024   Lung Cancer Screening  12/13/2024   DTaP/Tdap/Td (3 - Td or Tdap) 10/23/2028   Colonoscopy  04/14/2029   Pneumococcal Vaccine 81-9 Years old  Completed   Hepatitis C Screening  Completed   Zoster Vaccines- Shingrix   Completed   HPV VACCINES  Aged Out   Meningococcal B Vaccine  Aged Out   HIV Screening  Discontinued     ----------------------------------------------------------------------------------------------------------------------------------------------------------------------------------------------------------------- Physical Exam BP (!) 125/90   Pulse 78   Ht 5\' 10"  (1.778 m)   Wt 150 lb (68 kg)   SpO2 99%   BMI  21.52 kg/m   Physical Exam Constitutional:      General: He is not in acute distress. HENT:     Head: Normocephalic and atraumatic.     Right Ear: Tympanic membrane and external ear normal.     Left Ear: Tympanic membrane and external ear normal.  Eyes:     General: No scleral icterus. Neck:     Thyroid : No thyromegaly.  Cardiovascular:     Rate and Rhythm: Normal rate and regular rhythm.     Heart sounds: Normal heart sounds.  Pulmonary:     Effort: Pulmonary effort is normal.     Breath sounds: Normal breath sounds.  Abdominal:     General: Bowel sounds are normal. There is no distension.     Palpations: Abdomen is soft.     Tenderness: There is no abdominal tenderness. There is no guarding.  Musculoskeletal:     Cervical back: Normal range of motion.  Lymphadenopathy:     Cervical: No cervical adenopathy.  Skin:    General: Skin is warm and dry.     Findings: No rash.  Neurological:     Mental Status: He is alert and oriented to person, place, and time.     Cranial Nerves: No cranial nerve deficit.     Motor: No abnormal muscle tone.  Psychiatric:        Mood and Affect: Mood normal.        Behavior: Behavior normal.     ------------------------------------------------------------------------------------------------------------------------------------------------------------------------------------------------------------------- Assessment and Plan  Well adult exam Well adult Orders Placed This Encounter  Procedures   Pneumococcal conjugate vaccine 20-valent   CBC with Differential/Platelet   CMP14+EGFR   Lipid Panel With LDL/HDL Ratio   PSA, total and free   Testosterone    B12   Vitamin B1  Screening: PSA Immunizations: UTD, COVID vaccine discussed.  Anticipatory guidance/Risk factor reduction:  Counseled on smoking cessation and continuing to cut back on EtOH use.  Follow healthy diet with regular exercise.  Additional recommendations per AVS.    Meds  ordered this encounter  Medications   albuterol  (VENTOLIN  HFA) 108 (90 Base) MCG/ACT inhaler    Sig: Inhale 1-2 puffs into the lungs every 6 (six) hours as needed for wheezing or shortness of breath.    Dispense:  18 g    Refill:  2  No follow-ups on file.

## 2024-02-29 NOTE — Assessment & Plan Note (Addendum)
 Well adult Orders Placed This Encounter  Procedures   Pneumococcal conjugate vaccine 20-valent   CBC with Differential/Platelet   CMP14+EGFR   Lipid Panel With LDL/HDL Ratio   PSA, total and free   Testosterone    B12   Vitamin B1  Screening: PSA Immunizations: UTD, COVID vaccine discussed.  Anticipatory guidance/Risk factor reduction:  Counseled on smoking cessation and continuing to cut back on EtOH use.  Follow healthy diet with regular exercise.  Additional recommendations per AVS.

## 2024-03-06 LAB — CBC WITH DIFFERENTIAL/PLATELET
Basophils Absolute: 0.1 10*3/uL (ref 0.0–0.2)
Basos: 1 %
EOS (ABSOLUTE): 0.2 10*3/uL (ref 0.0–0.4)
Eos: 3 %
Hematocrit: 42 % (ref 37.5–51.0)
Hemoglobin: 14 g/dL (ref 13.0–17.7)
Immature Grans (Abs): 0 10*3/uL (ref 0.0–0.1)
Immature Granulocytes: 0 %
Lymphocytes Absolute: 1.5 10*3/uL (ref 0.7–3.1)
Lymphs: 29 %
MCH: 34.8 pg — ABNORMAL HIGH (ref 26.6–33.0)
MCHC: 33.3 g/dL (ref 31.5–35.7)
MCV: 105 fL — ABNORMAL HIGH (ref 79–97)
Monocytes Absolute: 0.5 10*3/uL (ref 0.1–0.9)
Monocytes: 10 %
Neutrophils Absolute: 3 10*3/uL (ref 1.4–7.0)
Neutrophils: 57 %
Platelets: 122 10*3/uL — ABNORMAL LOW (ref 150–450)
RBC: 4.02 x10E6/uL — ABNORMAL LOW (ref 4.14–5.80)
RDW: 13 % (ref 11.6–15.4)
WBC: 5.3 10*3/uL (ref 3.4–10.8)

## 2024-03-06 LAB — CMP14+EGFR
ALT: 17 IU/L (ref 0–44)
AST: 23 IU/L (ref 0–40)
Albumin: 3.8 g/dL — ABNORMAL LOW (ref 3.9–4.9)
Alkaline Phosphatase: 73 IU/L (ref 44–121)
BUN/Creatinine Ratio: 9 — ABNORMAL LOW (ref 10–24)
BUN: 8 mg/dL (ref 8–27)
Bilirubin Total: 0.8 mg/dL (ref 0.0–1.2)
CO2: 22 mmol/L (ref 20–29)
Calcium: 8.9 mg/dL (ref 8.6–10.2)
Chloride: 103 mmol/L (ref 96–106)
Creatinine, Ser: 0.92 mg/dL (ref 0.76–1.27)
Globulin, Total: 1.8 g/dL (ref 1.5–4.5)
Glucose: 89 mg/dL (ref 70–99)
Potassium: 4.3 mmol/L (ref 3.5–5.2)
Sodium: 139 mmol/L (ref 134–144)
Total Protein: 5.6 g/dL — ABNORMAL LOW (ref 6.0–8.5)
eGFR: 93 mL/min/{1.73_m2} (ref >59)

## 2024-03-06 LAB — VITAMIN B12: Vitamin B-12: 1027 pg/mL (ref 232–1245)

## 2024-03-06 LAB — TESTOSTERONE: Testosterone: 592 ng/dL (ref 264–916)

## 2024-03-06 LAB — LIPID PANEL WITH LDL/HDL RATIO
Cholesterol, Total: 105 mg/dL (ref 100–199)
HDL: 54 mg/dL (ref >39)
LDL Chol Calc (NIH): 33 mg/dL (ref 0–99)
LDL/HDL Ratio: 0.6 ratio (ref 0.0–3.6)
Triglycerides: 97 mg/dL (ref 0–149)
VLDL Cholesterol Cal: 18 mg/dL (ref 5–40)

## 2024-03-06 LAB — PSA, TOTAL AND FREE
PSA, Free Pct: 33.3 %
PSA, Free: 0.1 ng/mL
Prostate Specific Ag, Serum: 0.3 ng/mL (ref 0.0–4.0)

## 2024-03-06 LAB — VITAMIN B1: Thiamine: 204.2 nmol/L — ABNORMAL HIGH (ref 66.5–200.0)

## 2024-03-10 NOTE — Progress Notes (Signed)
 Cardiology Office Note:  .   Date:  03/24/2024  ID:  Oneil Poll, DOB [birth year], MRN [id number] PCP: Alvia Bring, DO  [facility] Providers Cardiologist:  Oneil Parchment, MD    History of Present Illness: Jeremiah Short   Ranjit Ashurst is a 63 y.o. male with a history of coronary artery disease with drug-eluting stents placed in the LAD in 2009 and 2017, chronic combined systolic and diastolic heart failure, aortic atherosclerosis, hyperlipidemia, COPD, Barrett's esophagus, and a history of tobacco use. He had a prior anterior myocardial infarction with mid LAD occlusion and fusion proximal to the previously placed LAD stent in 2017. His most recent Myovue in 2023 showed no ischemia, and an echocardiogram on June 21, 2023, showed an ejection fraction of 40-45%, improved from a previous 25-30%.  Patient comes in for f/u. No longer dizzy and BP has been good at home, highest 130/95 after smoking a cigarette. Smoking 1 ppd-can't seem to slow down or quit. Drinking 2 beers daily and 1/5 vodka on the weekend.     ROS:    Studies Reviewed: Jeremiah Short         Prior CV Studies:    Echo 06/2023 IMPRESSIONS     1. Apical akinesis. No LV thrombus. Left ventricular ejection fraction,  by estimation, is 40 to 45%. The left ventricle has mildly decreased  function. The left ventricle demonstrates global hypokinesis. There is  mild left ventricular hypertrophy. Left  ventricular diastolic parameters are indeterminate.   2. Right ventricular systolic function is normal. The right ventricular  size is normal. Tricuspid regurgitation signal is inadequate for assessing  PA pressure. The estimated right ventricular systolic pressure is 21.1  mmHg.   3. The mitral valve is normal in structure. Trivial mitral valve  regurgitation.   4. The aortic valve is tricuspid. Aortic valve regurgitation is not  visualized.   5. There is mild dilatation of the aortic root, measuring 40 mm.   6. The inferior vena cava  is normal in size with <50% respiratory  variability, suggesting right atrial pressure of 8 mmHg.   Conclusion(s)/Recommendation(s): EF has improved compared to prior study.    Risk Assessment/Calculations:        Orthostatic VS for the past 24 hrs (Last 3 readings):  BP- Lying Pulse- Lying BP- Sitting Pulse- Sitting BP- Standing at 0 minutes Pulse- Standing at 0 minutes BP- Standing at 3 minutes Pulse- Standing at 3 minutes  03/24/24 0953 128/90 98 (!) 134/100 113 (!) 126/92 122 (!) 122/91 130        Physical Exam:   VS:  BP 116/82   Pulse 100   Ht 5' 10 (1.778 m)   Wt 143 lb (64.9 kg)   SpO2 97%   BMI 20.52 kg/m    Wt Readings from Last 3 Encounters:  03/24/24 143 lb (64.9 kg)  02/29/24 150 lb (68 kg)  12/27/23 153 lb 9.6 oz (69.7 kg)    GEN: Well nourished, well developed in no acute distress NECK: No JVD; No carotid bruits CARDIAC:  RRR, no murmurs, rubs, gallops RESPIRATORY:  Clear to auscultation without rales, wheezing or rhonchi  ABDOMEN: Soft, non-tender, non-distended EXTREMITIES:  No edema; No deformity   ASSESSMENT AND PLAN: .    Chronic Heart Failure with Reduced Ejection Fraction (HFrEF) Chronic combined systolic and diastolic heart failure with a previously recorded ejection fraction of 25-30%. Recent echocardiogram on 06/21/23 showed improvement to 40-45%. Unable to tolerate certain goal-directed medical therapies like Entresto . Currently  on Farxiga  for fluid management - Continue Farxiga  10 mg daily. - Monitor for signs of fluid overload or dehydration. - Encourage cessation of alcohol  to prevent further cardiac damage.   Hypotension Patient was Experiencing hypotension with home blood pressure readings as low as 83/62 mmHg, causing lightheadedness upon standing. Likely exacerbated by dehydration and alcohol  consumption.   - BP systolic normal but diastolic running high,  -HR 98-130 with orthostatic BP check. Will start metoprolol  25 mg once  daily(prefer coreg  but wants once a day med) - isosorbide , spironolactone , metoprolol , and Entresto  all held due to low blood pressure. - Encourage rehydration and cessation of alcohol  consumption.  Coronary Artery Disease (CAD) Coronary artery disease with drug-eluting stents placed in the LAD in 2009 and 2017. Prior anterior MI with mid LAD occlusion. Most recent Myovue in 2023 showed no ischemia. Currently on aspirin , atorvastatin , and Plavix  for CAD management. - Continue aspirin  81 mg daily. - Continue atorvastatin . - continue  Plavix  75 mg.   Hyperlipidemia Hyperlipidemia with an LDL of 33 mg/dL, 12/7972, managed with atorvastatin . - Continue atorvastatin .  Tobacco abuse-smokes 1ppd >50 yrs and unable to quit -importance of smoking cessation discussed  ETOH- 2 beers daily and a fifth of vodka on weekends. -asked him to cut back.              Dispo: f/u in 2-3 weeks for med titrations  Signed, Olivia Pavy, PA-C

## 2024-03-12 ENCOUNTER — Ambulatory Visit: Payer: Self-pay | Admitting: Medical-Surgical

## 2024-03-24 ENCOUNTER — Encounter: Payer: Self-pay | Admitting: Physician Assistant

## 2024-03-24 ENCOUNTER — Ambulatory Visit: Attending: Physician Assistant | Admitting: Physician Assistant

## 2024-03-24 VITALS — BP 116/82 | HR 100 | Ht 70.0 in | Wt 143.0 lb

## 2024-03-24 DIAGNOSIS — E7800 Pure hypercholesterolemia, unspecified: Secondary | ICD-10-CM

## 2024-03-24 DIAGNOSIS — I25119 Atherosclerotic heart disease of native coronary artery with unspecified angina pectoris: Secondary | ICD-10-CM | POA: Diagnosis not present

## 2024-03-24 DIAGNOSIS — I5042 Chronic combined systolic (congestive) and diastolic (congestive) heart failure: Secondary | ICD-10-CM | POA: Diagnosis not present

## 2024-03-24 DIAGNOSIS — I952 Hypotension due to drugs: Secondary | ICD-10-CM

## 2024-03-24 DIAGNOSIS — F1090 Alcohol use, unspecified, uncomplicated: Secondary | ICD-10-CM

## 2024-03-24 DIAGNOSIS — Z720 Tobacco use: Secondary | ICD-10-CM

## 2024-03-24 MED ORDER — METOPROLOL SUCCINATE ER 25 MG PO TB24: 25.0000 mg | ORAL_TABLET | Freq: Every day | ORAL | 3 refills | Status: AC

## 2024-03-24 NOTE — Patient Instructions (Signed)
 Medication Instructions:   START TAKING : TOPROL  25 MG ONE  A DAY   *If you need a refill on your cardiac medications before your next appointment, please call your pharmacy*  Lab Work: NONE ORDERED  TODAY   If you have labs (blood work) drawn today and your tests are completely normal, you will receive your results only by: MyChart Message (if you have MyChart) OR A paper copy in the mail If you have any lab test that is abnormal or we need to change your treatment, we will call you to review the results.  Testing/Procedures: NONE ORDERED  TODAY    Follow-Up: At [facility], you and your health needs are our priority.  As part of our continuing mission to provide you with exceptional heart care, our providers are all part of one team.  This team includes your primary Cardiologist (physician) and Advanced Practice Providers or APPs (Physician Assistants and Nurse Practitioners) who all work together to provide you with the care you need, when you need it.  Your next appointment:   2-3  week(s)   Provider:   JEFFRIE BERLINER  We recommend signing up for the patient portal called MyChart.  Sign up information is provided on this After Visit Summary.  MyChart is used to connect with patients for Virtual Visits (Telemedicine).  Patients are able to view lab/test results, encounter notes, upcoming appointments, etc.  Non-urgent messages can be sent to your provider as well.   To learn more about what you can do with MyChart, go to [email or web address].   Other Instructions

## 2024-04-08 NOTE — Progress Notes (Signed)
 Cardiology Office Note:  .   Date:  04/15/2024  ID:  Jeremiah Short, DOB 28-Nov-1960, MRN [id number] PCP: Alvia Bring, DO  [facility] Providers Cardiologist:  Jeremiah Parchment, MD    History of Present Illness: Jeremiah Short   Jeremiah Short is a 63 y.o. male  with a history of coronary artery disease with drug-eluting stents placed in the LAD in 2009 and 2017, chronic combined systolic and diastolic heart failure, aortic atherosclerosis, hyperlipidemia, COPD, Barrett's esophagus, and a history of tobacco use. He had a prior anterior myocardial infarction with mid LAD occlusion and fusion proximal to the previously placed LAD stent in 2017. His most recent Myovue in 2023 showed no ischemia, and an echocardiogram on June 21, 2023, showed an ejection fraction of 40-45%, improved from a previous 25-30%.  I saw the patient 03/24/24 with orthostatic hypotension likely exacerbated by dehydration and alcohol  consumption. I started metoprolol  25 mg once daily. All other meds held.  Patient comes in for f/u. No more dizziness. Drinking 3-16 ounce bottles of water. Hasn't cut back on alcohol . Couple of beers a night and a fifth on the weekend. Works [employer].   ROS:    Studies Reviewed: Jeremiah Short         Prior CV Studies:   Echo 06/2023 IMPRESSIONS     1. Apical akinesis. No LV thrombus. Left ventricular ejection fraction,  by estimation, is 40 to 45%. The left ventricle has mildly decreased  function. The left ventricle demonstrates global hypokinesis. There is  mild left ventricular hypertrophy. Left  ventricular diastolic parameters are indeterminate.   2. Right ventricular systolic function is normal. The right ventricular  size is normal. Tricuspid regurgitation signal is inadequate for assessing  PA pressure. The estimated right ventricular systolic pressure is 21.1  mmHg.   3. The mitral valve is normal in structure. Trivial mitral valve  regurgitation.   4. The aortic valve is  tricuspid. Aortic valve regurgitation is not  visualized.   5. There is mild dilatation of the aortic root, measuring 40 mm.   6. The inferior vena cava is normal in size with <50% respiratory  variability, suggesting right atrial pressure of 8 mmHg.   Conclusion(s)/Recommendation(s): EF has improved compared to prior study.   Risk Assessment/Calculations:         Physical Exam:   VS:  BP 116/64   Pulse 86   Ht 5' 10 (1.778 m)   Wt 149 lb 9.6 oz (67.9 kg)   SpO2 95%   BMI 21.47 kg/m    Orhtostatics: Orthostatic VS for the past 24 hrs (Last 3 readings):  BP- Lying Pulse- Lying BP- Sitting Pulse- Sitting BP- Standing at 0 minutes Pulse- Standing at 0 minutes BP- Standing at 3 minutes Pulse- Standing at 3 minutes  04/15/24 1114 129/78 78 119/76 78 119/79 85 (!) 126/91 84  04/15/24 1110 124/88 78 129/86 78 119/86 77 -- --   Wt Readings from Last 3 Encounters:  04/15/24 149 lb 9.6 oz (67.9 kg)  03/24/24 143 lb (64.9 kg)  02/29/24 150 lb (68 kg)    GEN: thin, in no acute distress NECK: No JVD; No carotid bruits CARDIAC:  RRR, no murmurs, rubs, gallops RESPIRATORY:  Clear to auscultation without rales, wheezing or rhonchi  ABDOMEN: Soft, non-tender, non-distended EXTREMITIES:  No edema; No deformity   ASSESSMENT AND PLAN: .   Chronic Heart Failure with Reduced Ejection Fraction (HFrEF) Chronic combined systolic and diastolic heart failure with a previously recorded  ejection fraction of 25-30%. Recent echocardiogram on 06/21/23 showed improvement to 40-45%. Unable to tolerate certain goal-directed medical therapies like Entresto . Currently on Farxiga  for fluid management - Continue Farxiga  10 mg daily. - Monitor for signs of fluid overload or dehydration. - Encourage cessation of alcohol  to prevent further cardiac damage.   Hypotension- no further dizziness  Patient was was Experiencing hypotension with home blood pressure readings as low as 83/62 mmHg, causing lightheadedness  upon standing. Likely exacerbated by dehydration and alcohol  consumption.   -he was orthostatic last OV. - isosorbide , spironolactone , and Entresto  all held due to low blood pressure.  -Metoprolol  xl 25 mg daily started tolerating well.no further orthostasis. - Encourage rehydration and cessation of alcohol  consumption.   Coronary Artery Disease (CAD) Coronary artery disease with drug-eluting stents placed in the LAD in 2009 and 2017. Prior anterior MI with mid LAD occlusion. Most recent Myovue in 2023 showed no ischemia. Currently on aspirin , atorvastatin , and Plavix  for CAD management. - Continue aspirin  81 mg daily. - Continue atorvastatin . - continue  Plavix  75 mg. Hyperlipidemia Hyperlipidemia with an LDL of 33 mg/dL, 12/7972, managed with atorvastatin . - Continue atorvastatin .   Tobacco abuse-smokes 1ppd >50 yrs and unable to quit -importance of smoking cessation discussed   ETOH- 2 beers daily and a fifth of vodka on weekends. -asked him to cut back.          Dispo: f/u in 6 months.  Signed, Olivia Pavy, PA-C

## 2024-04-15 ENCOUNTER — Encounter: Payer: Self-pay | Admitting: Physician Assistant

## 2024-04-15 ENCOUNTER — Ambulatory Visit: Attending: Physician Assistant | Admitting: Physician Assistant

## 2024-04-15 VITALS — BP 116/64 | HR 86 | Ht 70.0 in | Wt 149.6 lb

## 2024-04-15 DIAGNOSIS — F1090 Alcohol use, unspecified, uncomplicated: Secondary | ICD-10-CM

## 2024-04-15 DIAGNOSIS — I952 Hypotension due to drugs: Secondary | ICD-10-CM

## 2024-04-15 DIAGNOSIS — I25119 Atherosclerotic heart disease of native coronary artery with unspecified angina pectoris: Secondary | ICD-10-CM | POA: Diagnosis not present

## 2024-04-15 DIAGNOSIS — Z720 Tobacco use: Secondary | ICD-10-CM | POA: Diagnosis not present

## 2024-04-15 DIAGNOSIS — I5042 Chronic combined systolic (congestive) and diastolic (congestive) heart failure: Secondary | ICD-10-CM

## 2024-04-15 NOTE — Patient Instructions (Signed)
 Medication Instructions:  Your physician recommends that you continue on your current medications as directed. Please refer to the Current Medication list given to you today.  *If you need a refill on your cardiac medications before your next appointment, please call your pharmacy*  Lab Work: None ordered  If you have labs (blood work) drawn today and your tests are completely normal, you will receive your results only by: MyChart Message (if you have MyChart) OR A paper copy in the mail If you have any lab test that is abnormal or we need to change your treatment, we will call you to review the results.  Testing/Procedures: None ordered  Follow-Up: At [facility], you and your health needs are our priority.  As part of our continuing mission to provide you with exceptional heart care, our providers are all part of one team.  This team includes your primary Cardiologist (physician) and Advanced Practice Providers or APPs (Physician Assistants and Nurse Practitioners) who all work together to provide you with the care you need, when you need it.  Your next appointment:   6 month(s)  Provider:   Oneil Parchment, MD    We recommend signing up for the patient portal called MyChart.  Sign up information is provided on this After Visit Summary.  MyChart is used to connect with patients for Virtual Visits (Telemedicine).  Patients are able to view lab/test results, encounter notes, upcoming appointments, etc.  Non-urgent messages can be sent to your provider as well.   To learn more about what you can do with MyChart, go to [email or web address].   Other Instructions Watch your Alcohol  & Smoking Use

## 2024-04-16 ENCOUNTER — Other Ambulatory Visit: Payer: Self-pay | Admitting: Family Medicine

## 2024-06-08 ENCOUNTER — Other Ambulatory Visit: Payer: Self-pay | Admitting: Family Medicine

## 2024-07-01 ENCOUNTER — Ambulatory Visit: Admitting: Cardiology

## 2024-07-01 ENCOUNTER — Other Ambulatory Visit: Payer: Self-pay | Admitting: Gastroenterology

## 2024-07-01 NOTE — Telephone Encounter (Signed)
 Patient needs to schedule follow up appointment.

## 2024-07-09 ENCOUNTER — Telehealth: Payer: Self-pay | Admitting: Gastroenterology

## 2024-07-09 NOTE — Telephone Encounter (Signed)
 Inbound call from patient requesting a refill for lansoprazole . Advised patient he is due for an office visit. Patient states he is feeling okay. States he does not have the financial means to pay for a follow up office visit when he is doing okay. Patient is requesting a call back. Please advise, thank you

## 2024-07-11 MED ORDER — LANSOPRAZOLE 30 MG PO CPDR: 30.0000 mg | DELAYED_RELEASE_CAPSULE | Freq: Every day | ORAL | 2 refills | Status: DC

## 2024-07-11 NOTE — Telephone Encounter (Signed)
 Refill sent.

## 2024-09-12 ENCOUNTER — Encounter: Payer: Self-pay | Admitting: Cardiology

## 2024-10-09 ENCOUNTER — Other Ambulatory Visit: Payer: Self-pay | Admitting: Gastroenterology
